# Patient Record
Sex: Male | Born: 1960 | Race: White | Hispanic: No | Marital: Single | Smoking: Current every day smoker
Health system: Southern US, Community
[De-identification: ages and names within clinical notes are randomized; demographics above are authoritative.]

## PROBLEM LIST (undated history)

## (undated) DIAGNOSIS — K089 Disorder of teeth and supporting structures, unspecified: Secondary | ICD-10-CM

## (undated) DIAGNOSIS — F1020 Alcohol dependence, uncomplicated: Secondary | ICD-10-CM

## (undated) DIAGNOSIS — F1010 Alcohol abuse, uncomplicated: Secondary | ICD-10-CM

## (undated) DIAGNOSIS — B182 Chronic viral hepatitis C: Secondary | ICD-10-CM

## (undated) DIAGNOSIS — K859 Acute pancreatitis without necrosis or infection, unspecified: Secondary | ICD-10-CM

## (undated) DIAGNOSIS — J449 Chronic obstructive pulmonary disease, unspecified: Secondary | ICD-10-CM

## (undated) DIAGNOSIS — K0889 Other specified disorders of teeth and supporting structures: Secondary | ICD-10-CM

## (undated) DIAGNOSIS — J869 Pyothorax without fistula: Secondary | ICD-10-CM

## (undated) DIAGNOSIS — M549 Dorsalgia, unspecified: Secondary | ICD-10-CM

## (undated) DIAGNOSIS — J189 Pneumonia, unspecified organism: Secondary | ICD-10-CM

## (undated) DIAGNOSIS — Z720 Tobacco use: Secondary | ICD-10-CM

## (undated) DIAGNOSIS — F17200 Nicotine dependence, unspecified, uncomplicated: Secondary | ICD-10-CM

---
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
## 2000-11-02 ENCOUNTER — Emergency Department (HOSPITAL_COMMUNITY): Admission: EM | Admit: 2000-11-02 | Discharge: 2000-11-02 | Payer: Self-pay | Admitting: Emergency Medicine

## 2000-11-02 ENCOUNTER — Encounter: Payer: Self-pay | Admitting: Emergency Medicine

## 2000-11-12 ENCOUNTER — Emergency Department (HOSPITAL_COMMUNITY): Admission: EM | Admit: 2000-11-12 | Discharge: 2000-11-12 | Payer: Self-pay | Admitting: *Deleted

## 2010-04-25 ENCOUNTER — Inpatient Hospital Stay (HOSPITAL_COMMUNITY): Admission: AC | Admit: 2010-04-25 | Discharge: 2010-05-20 | Payer: Self-pay | Source: Home / Self Care

## 2010-06-03 ENCOUNTER — Ambulatory Visit (HOSPITAL_COMMUNITY)
Admission: RE | Admit: 2010-06-03 | Discharge: 2010-06-03 | Payer: Self-pay | Source: Home / Self Care | Attending: General Surgery | Admitting: General Surgery

## 2010-06-30 ENCOUNTER — Ambulatory Visit: Admit: 2010-06-30 | Payer: Self-pay | Admitting: General Surgery

## 2010-08-19 LAB — CBC
HCT: 34.5 % — ABNORMAL LOW (ref 39.0–52.0)
HCT: 34.7 % — ABNORMAL LOW (ref 39.0–52.0)
HCT: 35 % — ABNORMAL LOW (ref 39.0–52.0)
HCT: 21.7 % — ABNORMAL LOW (ref 39.0–52.0)
HCT: 29.5 % — ABNORMAL LOW (ref 39.0–52.0)
HCT: 30.3 % — ABNORMAL LOW (ref 39.0–52.0)
HCT: 30.6 % — ABNORMAL LOW (ref 39.0–52.0)
HCT: 35 % — ABNORMAL LOW (ref 39.0–52.0)
Hemoglobin: 10.4 g/dL — ABNORMAL LOW (ref 13.0–17.0)
Hemoglobin: 10.8 g/dL — ABNORMAL LOW (ref 13.0–17.0)
Hemoglobin: 10.9 g/dL — ABNORMAL LOW (ref 13.0–17.0)
Hemoglobin: 11 g/dL — ABNORMAL LOW (ref 13.0–17.0)
Hemoglobin: 11.4 g/dL — ABNORMAL LOW (ref 13.0–17.0)
Hemoglobin: 11.5 g/dL — ABNORMAL LOW (ref 13.0–17.0)
Hemoglobin: 12 g/dL — ABNORMAL LOW (ref 13.0–17.0)
Hemoglobin: 10 g/dL — ABNORMAL LOW (ref 13.0–17.0)
Hemoglobin: 10.1 g/dL — ABNORMAL LOW (ref 13.0–17.0)
Hemoglobin: 10.1 g/dL — ABNORMAL LOW (ref 13.0–17.0)
Hemoglobin: 10.3 g/dL — ABNORMAL LOW (ref 13.0–17.0)
Hemoglobin: 10.8 g/dL — ABNORMAL LOW (ref 13.0–17.0)
Hemoglobin: 11.8 g/dL — ABNORMAL LOW (ref 13.0–17.0)
Hemoglobin: 14.3 g/dL (ref 13.0–17.0)
Hemoglobin: 9 g/dL — ABNORMAL LOW (ref 13.0–17.0)
Hemoglobin: 9.9 g/dL — ABNORMAL LOW (ref 13.0–17.0)
MCH: 30.5 pg (ref 26.0–34.0)
MCH: 30.9 pg (ref 26.0–34.0)
MCH: 31 pg (ref 26.0–34.0)
MCH: 31.1 pg (ref 26.0–34.0)
MCH: 31.3 pg (ref 26.0–34.0)
MCH: 31.3 pg (ref 26.0–34.0)
MCH: 31.4 pg (ref 26.0–34.0)
MCH: 33.5 pg (ref 26.0–34.0)
MCH: 33.8 pg (ref 26.0–34.0)
MCH: 34 pg (ref 26.0–34.0)
MCH: 34 pg (ref 26.0–34.0)
MCHC: 31.9 g/dL (ref 30.0–36.0)
MCHC: 32.3 g/dL (ref 30.0–36.0)
MCHC: 32.4 g/dL (ref 30.0–36.0)
MCHC: 32.5 g/dL (ref 30.0–36.0)
MCHC: 32.8 g/dL (ref 30.0–36.0)
MCHC: 32.9 g/dL (ref 30.0–36.0)
MCHC: 32.9 g/dL (ref 30.0–36.0)
MCHC: 33 g/dL (ref 30.0–36.0)
MCHC: 33.7 g/dL (ref 30.0–36.0)
MCHC: 34.2 g/dL (ref 30.0–36.0)
MCHC: 34.4 g/dL (ref 30.0–36.0)
MCHC: 35 g/dL (ref 30.0–36.0)
MCV: 97.9 fL (ref 78.0–100.0)
MCV: 98 fL (ref 78.0–100.0)
MCV: 98.3 fL (ref 78.0–100.0)
MCV: 98.3 fL (ref 78.0–100.0)
MCV: 98.8 fL (ref 78.0–100.0)
MCV: 95.8 fL (ref 78.0–100.0)
MCV: 99.3 fL (ref 78.0–100.0)
MCV: 99.4 fL (ref 78.0–100.0)
Platelets: 352 10*3/uL (ref 150–400)
Platelets: 408 10*3/uL — ABNORMAL HIGH (ref 150–400)
Platelets: 426 10*3/uL — ABNORMAL HIGH (ref 150–400)
Platelets: 457 10*3/uL — ABNORMAL HIGH (ref 150–400)
Platelets: 478 10*3/uL — ABNORMAL HIGH (ref 150–400)
Platelets: 522 10*3/uL — ABNORMAL HIGH (ref 150–400)
Platelets: 570 10*3/uL — ABNORMAL HIGH (ref 150–400)
Platelets: 120 10*3/uL — ABNORMAL LOW (ref 150–400)
Platelets: 140 10*3/uL — ABNORMAL LOW (ref 150–400)
Platelets: 154 10*3/uL (ref 150–400)
Platelets: 208 10*3/uL (ref 150–400)
Platelets: 239 10*3/uL (ref 150–400)
Platelets: 364 10*3/uL (ref 150–400)
Platelets: 487 10*3/uL — ABNORMAL HIGH (ref 150–400)
RBC: 3.27 MIL/uL — ABNORMAL LOW (ref 4.22–5.81)
RBC: 3.43 MIL/uL — ABNORMAL LOW (ref 4.22–5.81)
RBC: 3.45 MIL/uL — ABNORMAL LOW (ref 4.22–5.81)
RBC: 3.53 MIL/uL — ABNORMAL LOW (ref 4.22–5.81)
RBC: 3.56 MIL/uL — ABNORMAL LOW (ref 4.22–5.81)
RBC: 3.7 MIL/uL — ABNORMAL LOW (ref 4.22–5.81)
RBC: 3.73 MIL/uL — ABNORMAL LOW (ref 4.22–5.81)
RBC: 3.76 MIL/uL — ABNORMAL LOW (ref 4.22–5.81)
RBC: 3.78 MIL/uL — ABNORMAL LOW (ref 4.22–5.81)
RBC: 2.24 MIL/uL — ABNORMAL LOW (ref 4.22–5.81)
RBC: 2.6 MIL/uL — ABNORMAL LOW (ref 4.22–5.81)
RBC: 2.66 MIL/uL — ABNORMAL LOW (ref 4.22–5.81)
RBC: 2.68 MIL/uL — ABNORMAL LOW (ref 4.22–5.81)
RBC: 3.05 MIL/uL — ABNORMAL LOW (ref 4.22–5.81)
RBC: 3.08 MIL/uL — ABNORMAL LOW (ref 4.22–5.81)
RBC: 3.08 MIL/uL — ABNORMAL LOW (ref 4.22–5.81)
RBC: 3.11 MIL/uL — ABNORMAL LOW (ref 4.22–5.81)
RBC: 3.13 MIL/uL — ABNORMAL LOW (ref 4.22–5.81)
RBC: 3.18 MIL/uL — ABNORMAL LOW (ref 4.22–5.81)
RBC: 3.18 MIL/uL — ABNORMAL LOW (ref 4.22–5.81)
RBC: 3.52 MIL/uL — ABNORMAL LOW (ref 4.22–5.81)
RDW: 13.1 % (ref 11.5–15.5)
RDW: 13.3 % (ref 11.5–15.5)
RDW: 13.4 % (ref 11.5–15.5)
RDW: 14.1 % (ref 11.5–15.5)
RDW: 12 % (ref 11.5–15.5)
RDW: 12.4 % (ref 11.5–15.5)
WBC: 7.1 10*3/uL (ref 4.0–10.5)
WBC: 7.6 10*3/uL (ref 4.0–10.5)
WBC: 7.9 10*3/uL (ref 4.0–10.5)
WBC: 8.8 10*3/uL (ref 4.0–10.5)
WBC: 4.4 10*3/uL (ref 4.0–10.5)
WBC: 4.8 10*3/uL (ref 4.0–10.5)
WBC: 5.1 10*3/uL (ref 4.0–10.5)
WBC: 5.6 10*3/uL (ref 4.0–10.5)
WBC: 6.6 10*3/uL (ref 4.0–10.5)
WBC: 6.7 10*3/uL (ref 4.0–10.5)
WBC: 6.8 10*3/uL (ref 4.0–10.5)

## 2010-08-19 LAB — GLUCOSE, CAPILLARY
Glucose-Capillary: 105 mg/dL — ABNORMAL HIGH (ref 70–99)
Glucose-Capillary: 111 mg/dL — ABNORMAL HIGH (ref 70–99)
Glucose-Capillary: 112 mg/dL — ABNORMAL HIGH (ref 70–99)
Glucose-Capillary: 113 mg/dL — ABNORMAL HIGH (ref 70–99)
Glucose-Capillary: 118 mg/dL — ABNORMAL HIGH (ref 70–99)
Glucose-Capillary: 119 mg/dL — ABNORMAL HIGH (ref 70–99)
Glucose-Capillary: 120 mg/dL — ABNORMAL HIGH (ref 70–99)
Glucose-Capillary: 124 mg/dL — ABNORMAL HIGH (ref 70–99)
Glucose-Capillary: 127 mg/dL — ABNORMAL HIGH (ref 70–99)
Glucose-Capillary: 79 mg/dL (ref 70–99)
Glucose-Capillary: 90 mg/dL (ref 70–99)
Glucose-Capillary: 98 mg/dL (ref 70–99)

## 2010-08-19 LAB — COMPREHENSIVE METABOLIC PANEL
ALT: 26 U/L (ref 0–53)
ALT: 82 U/L — ABNORMAL HIGH (ref 0–53)
AST: 113 U/L — ABNORMAL HIGH (ref 0–37)
AST: 190 U/L — ABNORMAL HIGH (ref 0–37)
AST: 27 U/L (ref 0–37)
Albumin: 2.2 g/dL — ABNORMAL LOW (ref 3.5–5.2)
Albumin: 3 g/dL — ABNORMAL LOW (ref 3.5–5.2)
Albumin: 3.4 g/dL — ABNORMAL LOW (ref 3.5–5.2)
Alkaline Phosphatase: 44 U/L (ref 39–117)
Alkaline Phosphatase: 45 U/L (ref 39–117)
BUN: 6 mg/dL (ref 6–23)
CO2: 21 mEq/L (ref 19–32)
CO2: 27 mEq/L (ref 19–32)
CO2: 30 mEq/L (ref 19–32)
Calcium: 8.6 mg/dL (ref 8.4–10.5)
Calcium: 8.8 mg/dL (ref 8.4–10.5)
Chloride: 98 mEq/L (ref 96–112)
Chloride: 99 mEq/L (ref 96–112)
Creatinine, Ser: 0.74 mg/dL (ref 0.4–1.5)
GFR calc Af Amer: >60 mL/min (ref >60)
GFR calc Af Amer: >60 mL/min (ref >60)
GFR calc Af Amer: >60 mL/min (ref >60)
GFR calc non Af Amer: >60 mL/min (ref >60)
GFR calc non Af Amer: >60 mL/min (ref >60)
GFR calc non Af Amer: >60 mL/min (ref >60)
Glucose, Bld: 86 mg/dL (ref 70–99)
Potassium: 3.6 mEq/L (ref 3.5–5.1)
Potassium: 3.6 mEq/L (ref 3.5–5.1)
Sodium: 139 mEq/L (ref 135–145)
Sodium: 140 mEq/L (ref 135–145)
Total Bilirubin: 2 mg/dL — ABNORMAL HIGH (ref 0.3–1.2)
Total Bilirubin: 2.5 mg/dL — ABNORMAL HIGH (ref 0.3–1.2)
Total Protein: 6.7 g/dL (ref 6.0–8.3)

## 2010-08-19 LAB — BASIC METABOLIC PANEL
BUN: 3 mg/dL — ABNORMAL LOW (ref 6–23)
BUN: 5 mg/dL — ABNORMAL LOW (ref 6–23)
BUN: 6 mg/dL (ref 6–23)
CO2: 24 meq/L (ref 19–32)
Calcium: 8.1 mg/dL — ABNORMAL LOW (ref 8.4–10.5)
Calcium: 8.2 mg/dL — ABNORMAL LOW (ref 8.4–10.5)
Calcium: 8.7 mg/dL (ref 8.4–10.5)
Calcium: 8.7 mg/dL (ref 8.4–10.5)
Chloride: 104 mEq/L (ref 96–112)
Chloride: 105 meq/L (ref 96–112)
Creatinine, Ser: 0.63 mg/dL (ref 0.4–1.5)
GFR calc Af Amer: >60 mL/min (ref >60)
GFR calc Af Amer: >60 mL/min (ref >60)
GFR calc Af Amer: >60 mL/min (ref >60)
GFR calc Af Amer: >60 mL/min (ref >60)
GFR calc Af Amer: >60 mL/min (ref >60)
GFR calc Af Amer: >60 mL/min (ref >60)
GFR calc non Af Amer: >60 mL/min (ref >60)
GFR calc non Af Amer: >60 mL/min (ref >60)
GFR calc non Af Amer: >60 mL/min (ref >60)
GFR calc non Af Amer: >60 mL/min (ref >60)
GFR calc non Af Amer: >60 mL/min (ref >60)
Glucose, Bld: 107 mg/dL — ABNORMAL HIGH (ref 70–99)
Glucose, Bld: 144 mg/dL — ABNORMAL HIGH (ref 70–99)
Potassium: 3.7 mEq/L (ref 3.5–5.1)
Potassium: 3.8 mEq/L (ref 3.5–5.1)
Potassium: 4 meq/L (ref 3.5–5.1)
Potassium: 5 mEq/L (ref 3.5–5.1)
Sodium: 136 meq/L (ref 135–145)
Sodium: 137 mEq/L (ref 135–145)
Sodium: 139 mEq/L (ref 135–145)
Sodium: 140 mEq/L (ref 135–145)
Sodium: 142 mEq/L (ref 135–145)

## 2010-08-19 LAB — URINALYSIS, ROUTINE W REFLEX MICROSCOPIC
Bilirubin Urine: NEGATIVE
Glucose, UA: NEGATIVE mg/dL
Ketones, ur: >80 mg/dL — AB
pH: 5.5 (ref 5.0–8.0)

## 2010-08-19 LAB — POCT I-STAT, CHEM 8
BUN: <3 mg/dL — ABNORMAL LOW (ref 6–23)
Chloride: 104 meq/L (ref 96–112)
Creatinine, Ser: 1 mg/dL (ref 0.4–1.5)
Glucose, Bld: 95 mg/dL (ref 70–99)
Potassium: 3.8 meq/L (ref 3.5–5.1)
Sodium: 138 meq/L (ref 135–145)

## 2010-08-19 LAB — URINALYSIS, MICROSCOPIC ONLY
Bilirubin Urine: NEGATIVE
Ketones, ur: NEGATIVE mg/dL
Nitrite: NEGATIVE
Urobilinogen, UA: 1 mg/dL (ref 0.0–1.0)
pH: 8 (ref 5.0–8.0)

## 2010-08-19 LAB — DIFFERENTIAL
Basophils Relative: 0 % (ref 0–1)
Eosinophils Absolute: 0 10*3/uL (ref 0.0–0.7)
Lymphs Abs: 1.2 10*3/uL (ref 0.7–4.0)
Monocytes Relative: 14 % — ABNORMAL HIGH (ref 3–12)
Neutro Abs: 4.5 10*3/uL (ref 1.7–7.7)
Neutrophils Relative %: 68 % (ref 43–77)

## 2010-08-19 LAB — PROTIME-INR
INR: 1.54 — ABNORMAL HIGH (ref 0.00–1.49)
INR: 1.55 — ABNORMAL HIGH (ref 0.00–1.49)
INR: 1.88 — ABNORMAL HIGH (ref 0.00–1.49)
INR: 1.54 — ABNORMAL HIGH (ref 0.00–1.49)
INR: 1.74 — ABNORMAL HIGH (ref 0.00–1.49)
Prothrombin Time: 17.3 seconds — ABNORMAL HIGH (ref 11.6–15.2)
Prothrombin Time: 18.7 seconds — ABNORMAL HIGH (ref 11.6–15.2)
Prothrombin Time: 19.1 seconds — ABNORMAL HIGH (ref 11.6–15.2)
Prothrombin Time: 19.3 seconds — ABNORMAL HIGH (ref 11.6–15.2)
Prothrombin Time: 21.2 seconds — ABNORMAL HIGH (ref 11.6–15.2)
Prothrombin Time: 21.4 seconds — ABNORMAL HIGH (ref 11.6–15.2)
Prothrombin Time: 22.3 seconds — ABNORMAL HIGH (ref 11.6–15.2)
Prothrombin Time: 28.1 seconds — ABNORMAL HIGH (ref 11.6–15.2)
Prothrombin Time: 18.7 seconds — ABNORMAL HIGH (ref 11.6–15.2)
Prothrombin Time: 20.5 seconds — ABNORMAL HIGH (ref 11.6–15.2)
Prothrombin Time: 22 seconds — ABNORMAL HIGH (ref 11.6–15.2)

## 2010-08-19 LAB — TYPE AND SCREEN: Unit division: 0

## 2010-08-19 LAB — POCT I-STAT 3, ART BLOOD GAS (G3+)
Patient temperature: 99.9
TCO2: 29 mmol/L (ref 0–100)
pCO2 arterial: 36.8 mmHg (ref 35.0–45.0)
pH, Arterial: 7.477 — ABNORMAL HIGH (ref 7.350–7.450)

## 2010-08-19 LAB — CROSSMATCH
ABO/RH(D): AB POS
Unit division: 0

## 2010-08-19 LAB — CULTURE, RESPIRATORY W GRAM STAIN

## 2010-08-19 LAB — HEPARIN LEVEL (UNFRACTIONATED)
Heparin Unfractionated: 0.1 IU/mL — ABNORMAL LOW (ref 0.30–0.70)
Heparin Unfractionated: 0.12 IU/mL — ABNORMAL LOW (ref 0.30–0.70)
Heparin Unfractionated: 0.19 IU/mL — ABNORMAL LOW (ref 0.30–0.70)
Heparin Unfractionated: 0.28 IU/mL — ABNORMAL LOW (ref 0.30–0.70)
Heparin Unfractionated: 0.33 IU/mL (ref 0.30–0.70)
Heparin Unfractionated: 0.35 IU/mL (ref 0.30–0.70)
Heparin Unfractionated: 0.38 IU/mL (ref 0.30–0.70)
Heparin Unfractionated: 0.45 IU/mL (ref 0.30–0.70)
Heparin Unfractionated: 0.51 IU/mL (ref 0.30–0.70)
Heparin Unfractionated: <0.1 IU/mL — ABNORMAL LOW (ref 0.30–0.70)
Heparin Unfractionated: <0.1 IU/mL — ABNORMAL LOW (ref 0.30–0.70)
Heparin Unfractionated: <0.1 IU/mL — ABNORMAL LOW (ref 0.30–0.70)
Heparin Unfractionated: <0.1 [IU]/mL — ABNORMAL LOW (ref 0.30–0.70)
Heparin Unfractionated: >2 IU/mL — ABNORMAL HIGH (ref 0.30–0.70)

## 2010-08-19 LAB — AMMONIA: Ammonia: 19 umol/L (ref 11–35)

## 2010-08-19 LAB — URINE MICROSCOPIC-ADD ON

## 2010-08-19 LAB — MRSA PCR SCREENING: MRSA by PCR: NEGATIVE

## 2010-08-19 LAB — ABO/RH: ABO/RH(D): AB POS

## 2011-03-17 ENCOUNTER — Emergency Department (HOSPITAL_COMMUNITY)
Admission: EM | Admit: 2011-03-17 | Discharge: 2011-03-17 | Discharge status: Against Medical Advice | Payer: Medicaid Other | Attending: Emergency Medicine | Admitting: Emergency Medicine

## 2011-03-17 DIAGNOSIS — Z79899 Other long term (current) drug therapy: Secondary | ICD-10-CM | POA: Insufficient documentation

## 2011-03-17 DIAGNOSIS — R10819 Abdominal tenderness, unspecified site: Secondary | ICD-10-CM | POA: Insufficient documentation

## 2011-03-17 DIAGNOSIS — R109 Unspecified abdominal pain: Secondary | ICD-10-CM | POA: Insufficient documentation

## 2011-03-17 DIAGNOSIS — K644 Residual hemorrhoidal skin tags: Secondary | ICD-10-CM | POA: Insufficient documentation

## 2011-03-17 DIAGNOSIS — R197 Diarrhea, unspecified: Secondary | ICD-10-CM | POA: Insufficient documentation

## 2011-03-17 DIAGNOSIS — K859 Acute pancreatitis without necrosis or infection, unspecified: Secondary | ICD-10-CM | POA: Insufficient documentation

## 2011-03-17 DIAGNOSIS — F1010 Alcohol abuse, uncomplicated: Secondary | ICD-10-CM | POA: Insufficient documentation

## 2011-03-17 LAB — DIFFERENTIAL
Basophils Absolute: 0.1 10*3/uL (ref 0.0–0.1)
Lymphocytes Relative: 16 % (ref 12–46)
Lymphs Abs: 1 10*3/uL (ref 0.7–4.0)
Neutro Abs: 3.5 10*3/uL (ref 1.7–7.7)
Neutrophils Relative %: 54 % (ref 43–77)

## 2011-03-17 LAB — COMPREHENSIVE METABOLIC PANEL
ALT: 50 U/L (ref 0–53)
AST: 43 U/L — ABNORMAL HIGH (ref 0–37)
Albumin: 2.7 g/dL — ABNORMAL LOW (ref 3.5–5.2)
Alkaline Phosphatase: 117 U/L (ref 39–117)
BUN: 4 mg/dL — ABNORMAL LOW (ref 6–23)
Chloride: 94 mEq/L — ABNORMAL LOW (ref 96–112)
Potassium: 3.4 mEq/L — ABNORMAL LOW (ref 3.5–5.1)
Sodium: 128 mEq/L — ABNORMAL LOW (ref 135–145)
Total Bilirubin: 0.3 mg/dL (ref 0.3–1.2)

## 2011-03-17 LAB — CBC
HCT: 43.4 % (ref 39.0–52.0)
Hemoglobin: 15.8 g/dL (ref 13.0–17.0)
MCV: 93.3 fL (ref 78.0–100.0)
RBC: 4.65 MIL/uL (ref 4.22–5.81)
RDW: 13 % (ref 11.5–15.5)
WBC: 6.5 10*3/uL (ref 4.0–10.5)

## 2011-03-17 LAB — OCCULT BLOOD, POC DEVICE: Fecal Occult Bld: POSITIVE

## 2011-03-18 ENCOUNTER — Inpatient Hospital Stay (HOSPITAL_COMMUNITY)
Admission: EM | Admit: 2011-03-18 | Discharge: 2011-03-22 | DRG: 439 | Disposition: A | Discharge status: Home / Self Care | Payer: Medicaid Other | Attending: Internal Medicine | Admitting: Internal Medicine

## 2011-03-18 DIAGNOSIS — F10229 Alcohol dependence with intoxication, unspecified: Secondary | ICD-10-CM | POA: Diagnosis present

## 2011-03-18 DIAGNOSIS — I10 Essential (primary) hypertension: Secondary | ICD-10-CM | POA: Diagnosis not present

## 2011-03-18 DIAGNOSIS — K859 Acute pancreatitis without necrosis or infection, unspecified: Principal | ICD-10-CM | POA: Diagnosis present

## 2011-03-18 DIAGNOSIS — F17200 Nicotine dependence, unspecified, uncomplicated: Secondary | ICD-10-CM | POA: Diagnosis present

## 2011-03-18 DIAGNOSIS — Z23 Encounter for immunization: Secondary | ICD-10-CM

## 2011-03-18 DIAGNOSIS — E871 Hypo-osmolality and hyponatremia: Secondary | ICD-10-CM | POA: Diagnosis present

## 2011-03-18 DIAGNOSIS — E876 Hypokalemia: Secondary | ICD-10-CM | POA: Diagnosis not present

## 2011-03-18 LAB — CBC
HCT: 42.3 % (ref 39.0–52.0)
MCH: 34.1 pg — ABNORMAL HIGH (ref 26.0–34.0)
MCV: 93.6 fL (ref 78.0–100.0)
Platelets: 279 10*3/uL (ref 150–400)
RDW: 13 % (ref 11.5–15.5)

## 2011-03-18 LAB — DIFFERENTIAL
Eosinophils Absolute: 0.2 10*3/uL (ref 0.0–0.7)
Eosinophils Relative: 2 % (ref 0–5)
Lymphs Abs: 1.2 10*3/uL (ref 0.7–4.0)
Monocytes Relative: 16 % — ABNORMAL HIGH (ref 3–12)

## 2011-03-18 LAB — PROTIME-INR: Prothrombin Time: 12.9 seconds (ref 11.6–15.2)

## 2011-03-18 LAB — COMPREHENSIVE METABOLIC PANEL
AST: 28 U/L (ref 0–37)
Albumin: 2.7 g/dL — ABNORMAL LOW (ref 3.5–5.2)
BUN: 4 mg/dL — ABNORMAL LOW (ref 6–23)
CO2: 22 mEq/L (ref 19–32)
Calcium: 9.1 mg/dL (ref 8.4–10.5)
Creatinine, Ser: 0.59 mg/dL (ref 0.50–1.35)
GFR calc non Af Amer: >90 mL/min (ref >90)

## 2011-03-18 LAB — MAGNESIUM: Magnesium: 1.9 mg/dL (ref 1.5–2.5)

## 2011-03-18 LAB — RAPID URINE DRUG SCREEN, HOSP PERFORMED
Barbiturates: NOT DETECTED
Benzodiazepines: NOT DETECTED
Cocaine: NOT DETECTED
Tetrahydrocannabinol: NOT DETECTED

## 2011-03-18 LAB — GLUCOSE, CAPILLARY: Glucose-Capillary: 98 mg/dL (ref 70–99)

## 2011-03-18 LAB — LIPASE, BLOOD: Lipase: 100 U/L — ABNORMAL HIGH (ref 11–59)

## 2011-03-18 LAB — ETHANOL: Alcohol, Ethyl (B): 147 mg/dL — ABNORMAL HIGH (ref 0–11)

## 2011-03-18 LAB — HEMOGLOBIN A1C: Hgb A1c MFr Bld: 5.1 % (ref <5.7)

## 2011-03-19 LAB — GLUCOSE, CAPILLARY: Glucose-Capillary: 103 mg/dL — ABNORMAL HIGH (ref 70–99)

## 2011-03-19 LAB — CLOSTRIDIUM DIFFICILE BY PCR: Toxigenic C. Difficile by PCR: NEGATIVE

## 2011-03-19 NOTE — H&P (Signed)
[name], [name] NO.:  [id number]  MEDICAL RECORD NO.:  [id number]  LOCATION:  [facility]                         FACILITY:  [facility]  PHYSICIAN:  Manson Passey, MD        DATE OF BIRTH:  [birth year]  DATE OF ADMISSION:  03/18/2011 DATE OF DISCHARGE:                             HISTORY & PHYSICAL   PRIMARY CARE PHYSICIAN:  Unassigned.  CHIEF COMPLAINT:  Diarrhea and abdominal pain for 7 days.  HPI:  The patient is a 50 year old male with a history of chronic alcohol abuse, history of pancreatitis, active smoker, who presents to emergency room with complaints of having diarrhea for 7 days prior to admission to emergency room.  The patient reports having at least 10 bowel movements a day with no blood in it, no pause, no mucus.  The patient has tried Imodium over the counter.  However, there was no symptomatic relief.  The patient also complains of associated periumbilical pain that radiates to the left and right upper quadrant,constant, 10/10 in intensity (at present has subsided to 7/10 in intensity.)  The patient also reports having nausea and has vomited only 1 time 7 days ago with no blood in the vomiting.  The patient denied hemoptysis or hematemesis.  The patient denies chest pain, shortness of breath, cough, fever, or chills.  The patient denies blood in the stool or urine.  There are no reports of lightheadedness, dizziness, loss of consciousness.  The patient reports drinking 3 beers a day for more than 20 years, but has not have similar symptoms in back.  REVIEW OF SYSTEMS:  As per HPI.  PAST MEDICAL HISTORY: 1. Chronic alcohol abuse. 2. Pancreatitis. 3. Active smoker.  PAST SURGICAL HISTORY: 1. Surgery on the neck.  FAMILY HISTORY: 1. Father, hypertension.  SOCIAL HISTORY:  The patient reports drinking 3-4 beers a day for more than 20 years, smokes 2 packs per day for 25 years, no illegal drug abuse.  ALLERGIES:  No known drug  allergies.  MEDICATIONS:  At home; 1. Vicodin 1 to 1-1/2 tab daily as needed. 2. Goody's powders (aspirin/APAP/caffeine) 1 packet daily as needed.  PHYSICAL EXAM:  VITAL SIGNS:  Blood pressure 130/77, pulse 110, respirations 22, temperature 98.1 (T-max 98.7 Fahrenheit), and oxygen saturation 96% on room air. GENERAL APPEARANCE:  No acute distress, the patient appears comfortable. HEENT:  Normocephalic/atraumatic, pupils equally round, reactive to light and accommodation, extraocular muscles intact, no scleral icterus, no tonsillar erythema, or exudate. NECK:  Supple, full range of motion, no lymphadenopathy. LUNGS:  Clear to auscultation bilaterally, no wheezing, no rales, no rhonchi. CARDIOVASCULAR:  Positive S1 and S2, regular rate and rhythm.  No murmurs. ABDOMEN:  Positive bowel sounds, soft, nontender/nondistended. EXTREMITIES:  Pulses palpable bilaterally, no lower extremity edema. SKIN:  Warm, dry. NEUROLOGIC:  Alert, awake, oriented x3, no focal neurologic deficits.  LABORATORY:  March 18, 2011, lipase 100, alcohol level 147.  Sodium 131 potassium 3.5 chloride 96, bicarb 22 glucose 98, BUN 4 creatinine 0.59, bilirubin total 0.5, ALT 105 AST 28, ALT 38, albumin 2.7, calcium 9.1.  White blood cells 8.9, hemoglobin 15.4, hematocrit 42.3, platelet count 279.  ASSESSMENT AND PLAN:  1. Acute alcohol intoxication.  The patient complained of diarrhea is     likely related to the acute alcohol intoxication.  For now, we will     continue IV fluids at 150 mL an hour normal saline, and will     monitor for withdrawal.  At present, the patient displays no signs     of acute alcohol withdrawal.  We will initiate multivitamin,     thiamine, and folic acid.  We will monitor electrolytes. 2. Acute pancreatitis, likely slightly low 100.  Keep the patient     n.p.o. for now with aggressive fluid hydration with normal saline     anywhere from 150-200 mL an hour.  We will check LFTs and  trend     lipase.  Start Protonix 40 mg IV daily.  We will obtain abdominal x-     ray. 3. History of smoking/active smoker, the patient will get nicotine     contained patch. 4. Diet n.p.o. 5. Deep venous thrombosis prophylaxis, Lovenox subcu. 6. Education, the patient is aware and understands the plan of care     and treatment. 7. Time spent admitting the patient more than 25 minutes.          ______________________________ Manson Passey, MD     AD/MEDQ  D:  03/18/2011  T:  03/18/2011  Job:  [id number]  Electronically Signed by Manson Passey MD on 03/19/2011 09:42:53 AM

## 2011-03-20 LAB — BASIC METABOLIC PANEL
BUN: 5 mg/dL — ABNORMAL LOW (ref 6–23)
Chloride: 100 mEq/L (ref 96–112)
GFR calc Af Amer: >90 mL/min (ref >90)
GFR calc non Af Amer: >90 mL/min (ref >90)
Potassium: 3.1 mEq/L — ABNORMAL LOW (ref 3.5–5.1)
Sodium: 133 mEq/L — ABNORMAL LOW (ref 135–145)

## 2011-03-20 LAB — LIPASE, BLOOD: Lipase: 261 U/L — ABNORMAL HIGH (ref 11–59)

## 2011-03-20 LAB — GLUCOSE, CAPILLARY
Glucose-Capillary: 109 mg/dL — ABNORMAL HIGH (ref 70–99)
Glucose-Capillary: 109 mg/dL — ABNORMAL HIGH (ref 70–99)
Glucose-Capillary: 100 mg/dL — ABNORMAL HIGH (ref 70–99)

## 2011-03-21 LAB — BASIC METABOLIC PANEL
BUN: 3 mg/dL — ABNORMAL LOW (ref 6–23)
CO2: 23 mEq/L (ref 19–32)
Calcium: 8.4 mg/dL (ref 8.4–10.5)
Chloride: 100 mEq/L (ref 96–112)
Creatinine, Ser: 0.5 mg/dL (ref 0.50–1.35)
Glucose, Bld: 96 mg/dL (ref 70–99)

## 2011-03-21 LAB — CBC
Hemoglobin: 12.7 g/dL — ABNORMAL LOW (ref 13.0–17.0)
MCH: 33.6 pg (ref 26.0–34.0)
MCHC: 35.2 g/dL (ref 30.0–36.0)
Platelets: 310 10*3/uL (ref 150–400)
RDW: 12.9 % (ref 11.5–15.5)

## 2011-03-21 LAB — GLUCOSE, CAPILLARY
Glucose-Capillary: 125 mg/dL — ABNORMAL HIGH (ref 70–99)
Glucose-Capillary: 99 mg/dL (ref 70–99)

## 2011-03-22 LAB — BASIC METABOLIC PANEL
BUN: 3 mg/dL — ABNORMAL LOW (ref 6–23)
CO2: 24 mEq/L (ref 19–32)
Chloride: 101 mEq/L (ref 96–112)
GFR calc non Af Amer: >90 mL/min (ref >90)
Glucose, Bld: 99 mg/dL (ref 70–99)
Potassium: 3.3 mEq/L — ABNORMAL LOW (ref 3.5–5.1)
Sodium: 135 mEq/L (ref 135–145)

## 2011-03-22 LAB — GLUCOSE, CAPILLARY: Glucose-Capillary: 103 mg/dL — ABNORMAL HIGH (ref 70–99)

## 2011-03-29 NOTE — Discharge Summary (Signed)
[name], [name] NO.:  [id number]  MEDICAL RECORD NO.:  [id number]  LOCATION:  5509                         FACILITY:  [facility]  PHYSICIAN:  Manson Passey, MD        DATE OF BIRTH:  [birth year]  DATE OF ADMISSION:  03/18/2011 DATE OF DISCHARGE:  03/22/2011                              DISCHARGE SUMMARY   PRIMARY CARE PHYSICIAN:  Unassigned.  DISCHARGE DIAGNOSES: 1. Acute pancreatitis 2. Diarrhea. 3. History of alcohol abuse. 4. Active smoker.  DISCHARGE MEDICATIONS: 1. Pancreatic lipase 3 times a day with meals. 2. Folic acid 1 mg daily. 3. Labetalol 100 mg twice daily. 4. Multivitamin 1 tablets daily. 5. Zofran 4 mg every 6 hours as needed. 6. Protonix 40 mg by mouth daily. 7. Thiamine 100 mg daily. 8. Vicodin 5/500, 1 to 1-1/2 tab daily as needed.  CONSULTATIONS:  None.  DIAGNOSTIC STUDIES:  None.  DISCHARGE LABS:  Magnesium 1.7, sodium 135, potassium 3.3, chloride 101, bicarb 24, glucose 99, BUN 3 creatinine 0.6, calcium 8.7.  White blood cells 8.7, hemoglobin 12.7, hematocrit 36.1, platelet count 310,000.  C. diff negative.  Hemoglobin A1c 5.1.  HISTORY OF PRESENT ILLNESS:  The patient is a pleasant 50 year old male with history of chronic alcohol abuse, pancreatitis, active smoker who presents to emergency room with complaints of diarrhea as many as 15 episodes a day, nonbloody, started 7 days prior to admission.  The patient reports no history of black stool or blood in the stool.  The patient also complained of periumbilical pain, constant, 10/10 in intensity on admission.  He reported having nausea and only 1 episode of vomiting.  It happened 7 days ago prior to admission.  No complaints of chest pain, shortness of breath, cough, fever, or chills.  No complaints of lightheadedness or dizziness or loss of consciousness.  PHYSICAL EXAMINATION:  VITAL SIGNS:  Blood pressure 152/88, pulse 81, respirations 18, temperature 98.9 Fahrenheit,  oxygen saturation 94% on room air. GENERAL:  No acute distress, the patient appears comfortable. NECK:  Supple, no lymphadenopathy. SKIN:  Warm, dry. LUNGS:  Clear to auscultation bilaterally, no wheezing, no rales, no rhonchi. CARDIOVASCULAR:  Positive S1, S2, regular rate and rhythm. ABDOMEN:  Positive bowel sounds, soft, nontender/nondistended. EXTREMITIES:  Pulses palpable bilaterally, no lower extremity edema. NEUROLOGICAL:  Alert, awake, oriented x3, no focal neurologic deficits. HEENT:  Normocephalic/atraumatic, pupils equally round, reactive to light and accommodation, extraocular muscles intact, no tonsillar erythema or exudate.  HOSPITAL COURSE BY PROBLEM: 1. Acute pancreatitis.  The patient at present has no complaints of     abdominal pain.  The patient will be sent home on Vicodin every 4     hours as needed for pain.  The patient will be also sent home on     regimen of pancreatic lipase enzymes 3 times a day with meals as he     reports significant symptomatic improvement if he started this     medication. 2. History of alcohol abuse.  The patient had an alcohol level of     about 100 on admission.  At present, the patient has no signs or     symptoms  of withdrawal.  The patient is alert, awake, oriented to     time, place, and person.  Counseling was provided on alcohol abuse     and the patient reported verbal understanding and possibility to     follow with alcohol anonymous group to help him quit drinking. 3. History of smoking.  Counseling was provided for smoking cessation.     At this point, the patient verbalizes understanding and does not     wish to quit. 4. Diet regular. 5. Education.  The patient is aware of plan of care and treatment.     The patient is medically stable and clinically appears well to be     discharged home.  TIME SPENT ON DISCHARGING THE PATIENT:  More than 35 minutes.          ______________________________ Manson Passey,  MD     AD/MEDQ  D:  03/22/2011  T:  03/23/2011  Job:  [id number]  Electronically Signed by Manson Passey MD on 03/29/2011 04:11:51 PM

## 2011-04-08 LAB — STOOL CULTURE

## 2011-06-09 HISTORY — PX: KNEE SURGERY: SHX244

## 2011-06-09 HISTORY — PX: NECK SURGERY: SHX720

## 2011-06-09 HISTORY — PX: ARTERY REPAIR: SHX559

## 2012-03-08 ENCOUNTER — Emergency Department (HOSPITAL_COMMUNITY): Payer: Self-pay

## 2012-03-08 ENCOUNTER — Encounter (HOSPITAL_COMMUNITY): Payer: Self-pay | Admitting: Emergency Medicine

## 2012-03-08 ENCOUNTER — Ambulatory Visit (HOSPITAL_COMMUNITY): Admission: AD | Admit: 2012-03-08 | Payer: Self-pay | Source: Ambulatory Visit | Admitting: Psychiatry

## 2012-03-08 ENCOUNTER — Emergency Department (HOSPITAL_COMMUNITY)
Admission: EM | Admit: 2012-03-08 | Discharge: 2012-03-08 | Disposition: A | Discharge status: Home / Self Care | Payer: Self-pay | Attending: Emergency Medicine | Admitting: Emergency Medicine

## 2012-03-08 DIAGNOSIS — R110 Nausea: Secondary | ICD-10-CM | POA: Insufficient documentation

## 2012-03-08 DIAGNOSIS — F1010 Alcohol abuse, uncomplicated: Secondary | ICD-10-CM

## 2012-03-08 DIAGNOSIS — R109 Unspecified abdominal pain: Secondary | ICD-10-CM | POA: Insufficient documentation

## 2012-03-08 DIAGNOSIS — F10239 Alcohol dependence with withdrawal, unspecified: Secondary | ICD-10-CM | POA: Insufficient documentation

## 2012-03-08 DIAGNOSIS — F10939 Alcohol use, unspecified with withdrawal, unspecified: Secondary | ICD-10-CM | POA: Insufficient documentation

## 2012-03-08 HISTORY — DX: Chronic viral hepatitis C: B18.2

## 2012-03-08 HISTORY — DX: Dorsalgia, unspecified: M54.9

## 2012-03-08 LAB — CBC WITH DIFFERENTIAL/PLATELET
Basophils Absolute: 0.1 10*3/uL (ref 0.0–0.1)
Basophils Relative: 1 % (ref 0–1)
Hemoglobin: 17.3 g/dL — ABNORMAL HIGH (ref 13.0–17.0)
Lymphocytes Relative: 23 % (ref 12–46)
MCHC: 35.6 g/dL (ref 30.0–36.0)
Monocytes Relative: 9 % (ref 3–12)
Neutro Abs: 3.2 10*3/uL (ref 1.7–7.7)
Neutrophils Relative %: 64 % (ref 43–77)
RDW: 13 % (ref 11.5–15.5)
WBC: 5 10*3/uL (ref 4.0–10.5)

## 2012-03-08 LAB — COMPREHENSIVE METABOLIC PANEL
ALT: 67 U/L — ABNORMAL HIGH (ref 0–53)
AST: 79 U/L — ABNORMAL HIGH (ref 0–37)
Albumin: 3.6 g/dL (ref 3.5–5.2)
Alkaline Phosphatase: 81 U/L (ref 39–117)
Chloride: 100 mEq/L (ref 96–112)
Potassium: 4.2 mEq/L (ref 3.5–5.1)
Total Bilirubin: 0.5 mg/dL (ref 0.3–1.2)

## 2012-03-08 LAB — RAPID URINE DRUG SCREEN, HOSP PERFORMED
Barbiturates: NOT DETECTED
Cocaine: POSITIVE — AB
Tetrahydrocannabinol: NOT DETECTED

## 2012-03-08 MED ORDER — THIAMINE HCL 100 MG/ML IJ SOLN: 100.0000 mg | Freq: Every day | INTRAMUSCULAR | Status: DC

## 2012-03-08 MED ORDER — LORAZEPAM 2 MG/ML IJ SOLN
2.0000 mg | Freq: Once | INTRAMUSCULAR | Status: AC
  Administered 2012-03-08: 2 mg via INTRAVENOUS
  Filled 2012-03-08: qty 1

## 2012-03-08 MED ORDER — LORAZEPAM 1 MG PO TABS: 0.0000 mg | ORAL_TABLET | Freq: Two times a day (BID) | ORAL | Status: DC

## 2012-03-08 MED ORDER — FOLIC ACID 1 MG PO TABS
1.0000 mg | ORAL_TABLET | Freq: Every day | ORAL | Status: DC
  Administered 2012-03-08: 1 mg via ORAL
  Filled 2012-03-08: qty 1

## 2012-03-08 MED ORDER — IBUPROFEN 200 MG PO TABS: 600.0000 mg | ORAL_TABLET | Freq: Three times a day (TID) | ORAL | Status: DC | PRN

## 2012-03-08 MED ORDER — NICOTINE 21 MG/24HR TD PT24: 21.0000 mg | MEDICATED_PATCH | Freq: Every day | TRANSDERMAL | Status: DC

## 2012-03-08 MED ORDER — VITAMIN B-1 100 MG PO TABS
100.0000 mg | ORAL_TABLET | Freq: Every day | ORAL | Status: DC
  Administered 2012-03-08: 100 mg via ORAL
  Filled 2012-03-08: qty 1

## 2012-03-08 MED ORDER — ONDANSETRON HCL 8 MG PO TABS: 4.0000 mg | ORAL_TABLET | Freq: Three times a day (TID) | ORAL | Status: DC | PRN

## 2012-03-08 MED ORDER — NICOTINE 21 MG/24HR TD PT24
21.0000 mg | MEDICATED_PATCH | Freq: Once | TRANSDERMAL | Status: DC
  Administered 2012-03-08: 21 mg via TRANSDERMAL
  Filled 2012-03-08: qty 1

## 2012-03-08 MED ORDER — ALUM & MAG HYDROXIDE-SIMETH 200-200-20 MG/5ML PO SUSP: 30.0000 mL | ORAL | Status: DC | PRN

## 2012-03-08 MED ORDER — LORAZEPAM 2 MG/ML IJ SOLN: 1.0000 mg | Freq: Four times a day (QID) | INTRAMUSCULAR | Status: DC | PRN

## 2012-03-08 MED ORDER — SODIUM CHLORIDE 0.9 % IV SOLN
INTRAVENOUS | Status: DC
  Administered 2012-03-08: 12:00:00 via INTRAVENOUS

## 2012-03-08 MED ORDER — LORAZEPAM 1 MG PO TABS: 0.0000 mg | ORAL_TABLET | Freq: Four times a day (QID) | ORAL | Status: DC

## 2012-03-08 MED ORDER — LORAZEPAM 1 MG PO TABS
1.0000 mg | ORAL_TABLET | Freq: Four times a day (QID) | ORAL | Status: DC | PRN
  Administered 2012-03-08: 1 mg via ORAL
  Filled 2012-03-08: qty 1

## 2012-03-08 MED ORDER — ONDANSETRON HCL 4 MG/2ML IJ SOLN
4.0000 mg | Freq: Once | INTRAMUSCULAR | Status: AC
  Administered 2012-03-08: 4 mg via INTRAVENOUS
  Filled 2012-03-08: qty 2

## 2012-03-08 MED ORDER — SODIUM CHLORIDE 0.9 % IV BOLUS (SEPSIS)
1000.0000 mL | Freq: Once | INTRAVENOUS | Status: AC
  Administered 2012-03-08: 1000 mL via INTRAVENOUS

## 2012-03-08 MED ORDER — ADULT MULTIVITAMIN W/MINERALS CH
1.0000 | ORAL_TABLET | Freq: Every day | ORAL | Status: DC
  Administered 2012-03-08: 1 via ORAL
  Filled 2012-03-08: qty 1

## 2012-03-08 NOTE — ED Notes (Signed)
Patient states he was told by his lawyer to  Come to hospital to dry out. Patient states he has court date later this month for 3rd DWI. Patient denies chest pain. Patient states he feels anxious.

## 2012-03-08 NOTE — ED Notes (Signed)
Patient request to be discharged AMA.

## 2012-03-08 NOTE — ED Notes (Signed)
To x-ray

## 2012-03-08 NOTE — ED Notes (Signed)
Pt given snacks and something to drink by [facility], EMT

## 2012-03-08 NOTE — BH Assessment (Signed)
Assessment Note   [name] is an 51 y.o. male  that was assessed this day.  Pt presents to [facility] requesting detox from alcohol.  Pt stated in the last year, he has gotten 3 DWI's and has an upcoming court date 04/04/12.  Pt stated he wants detox, "because I got to show the courts I can do right."  Pt reports he drinks 3 40 oz beers daily, last use was last night and pt had 2 40 oz beers.  Pt denies other drug use, but UDS positive for cocaine.  Pt has not had any previous mental health or SA treatment.  Pt denies any current withdrawal sx.  Pt denies seizure hx or blackouts.  Pt does endorse some depressive sx related to drug use.  Pt denies SI/HI or psychosis.  Pt is not prescribed any psychotropic medications.  Completed assessment, assessment notification and faxed to [facility] to run for possible admission.  Updated ED staff.   Axis I: Substance Induced Mood Disorder and ETOH Dependence Axis II: Deferred Axis III:  Past Medical History  Diagnosis Date  . 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 Back pain    Axis IV: other psychosocial or environmental problems, problems related to legal system/crime and problems with primary support group Axis V: 31-40 impairment in reality testing  Past Medical History:  Past Medical History  Diagnosis Date  . 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 Back pain     No past surgical history on file.  Family History: No family history on file.  Social History:  reports that he has been smoking.  He does not have any smokeless tobacco history on file. He reports that he drinks alcohol. His drug history not on file.  Additional Social History:  Alcohol / Drug Use Pain Medications: see MAR Prescriptions: see MAR Over the Counter: see MAR History of alcohol / drug use?: Yes Substance #1 Name of Substance 1: ETOH 1 - Age of First Use: 20 1 - Amount (size/oz): 3 40 oz beers 1 - Frequency: daily 1 - Duration: ongoing 1 - Last Use / Amount: 03/07/12 - 2 40 oz beers  CIWA:  CIWA-Ar BP: 165/103 mmHg Pulse Rate: 139  Nausea and Vomiting: no nausea and no vomiting Tactile Disturbances: none Tremor: no tremor Auditory Disturbances: not present Paroxysmal Sweats: no sweat visible Visual Disturbances: not present Anxiety: no anxiety, at ease Headache, Fullness in Head: none present Agitation: normal activity Orientation and Clouding of Sensorium: oriented and can do serial additions CIWA-Ar Total: 0  COWS:    Allergies:  Allergies  Allergen Reactions  . Penicillins Other (See Comments)    "doesn't remember"    Home Medications:  (Not in a hospital admission)  OB/GYN Status:  No LMP for male patient.  General Assessment Data Location of Assessment: [facility] ED Living Arrangements: Spouse/significant other (Lives with girlfriend) Can pt return to current living arrangement?: Yes Admission Status: Voluntary Is patient capable of signing voluntary admission?: Yes Transfer from: Acute Hospital Referral Source: Self/Family/Friend  Education Status Is patient currently in school?: No  Risk to self Suicidal Ideation: No Suicidal Intent: No Is patient at risk for suicide?: No Suicidal Plan?: No Access to Means: No What has been your use of drugs/alcohol within the last 12 months?: Daily ETOH use, UDS positive cocaine, pt denies use Previous Attempts/Gestures: No How many times?: 0  Other Self Harm Risks: pt denies Triggers for Past Attempts: None known Intentional Self Injurious Behavior: None  Family Suicide History: No Recent stressful life event(s): Other (Comment) (Upcoming court date for 3 DWI's) Persecutory voices/beliefs?: No Depression: Yes Depression Symptoms: Despondent;Feeling worthless/self pity Substance abuse history and/or treatment for substance abuse?: Yes Suicide prevention information given to non-admitted patients: Not applicable  Risk to Others Homicidal Ideation: No Thoughts of Harm to Others: No Current Homicidal Intent:  No Current Homicidal Plan: No Access to Homicidal Means: No Identified Victim: na - pt denies History of harm to others?: No Assessment of Violence: None Noted Violent Behavior Description: na - pt calm, cooperative Does patient have access to weapons?: No Criminal Charges Pending?: Yes Describe Pending Criminal Charges: 3 DWI's Does patient have a court date: Yes Court Date: 04/04/12  Psychosis Hallucinations: None noted Delusions: None noted  Mental Status Report Appear/Hygiene: Disheveled Eye Contact: Fair Motor Activity: Unremarkable Speech: Logical/coherent Level of Consciousness: Alert Mood: Depressed Affect: Appropriate to circumstance Anxiety Level: Minimal Thought Processes: Coherent;Relevant Judgement: Unimpaired Orientation: Person;Place;Time;Situation Obsessive Compulsive Thoughts/Behaviors: None  Cognitive Functioning Concentration: Decreased Memory: Recent Intact;Remote Intact IQ: Average Insight: Fair Impulse Control: Poor Appetite: Fair Weight Loss: 0  Weight Gain: 0  Sleep: No Change Total Hours of Sleep:  (3-4 hrs per night) Vegetative Symptoms: None  ADLScreening [facility] Assessment Services) Patient's cognitive ability adequate to safely complete daily activities?: Yes Patient able to express need for assistance with ADLs?: Yes Independently performs ADLs?: Yes (appropriate for developmental age)  Abuse/Neglect [facility]) Physical Abuse: Denies Verbal Abuse: Denies Sexual Abuse: Denies  Prior Inpatient Therapy Prior Inpatient Therapy: No Prior Therapy Dates: na Prior Therapy Facilty/Provider(s): na Reason for Treatment: na  Prior Outpatient Therapy Prior Outpatient Therapy: No Prior Therapy Dates: na Prior Therapy Facilty/Provider(s): na Reason for Treatment: na  ADL Screening (condition at time of admission) Patient's cognitive ability adequate to safely complete daily activities?: Yes Patient able to express need for assistance with  ADLs?: Yes Independently performs ADLs?: Yes (appropriate for developmental age) Weakness of Legs: None Weakness of Arms/Hands: None  Home Assistive Devices/Equipment Home Assistive Devices/Equipment: None    Abuse/Neglect Assessment (Assessment to be complete while patient is alone) Physical Abuse: Denies Verbal Abuse: Denies Sexual Abuse: Denies Exploitation of patient/patient's resources: Denies Self-Neglect: Denies Values / Beliefs Cultural Requests During Hospitalization: None Spiritual Requests During Hospitalization: None Consults Spiritual Care Consult Needed: No Social Work Consult Needed: No Merchant navy officer (For Healthcare) Advance Directive: Patient does not have advance directive;Patient would not like information    Additional Information 1:1 In Past 12 Months?: No CIRT Risk: No Elopement Risk: No Does patient have medical clearance?: Yes     Disposition:  Disposition Disposition of Patient: Referred to;Inpatient treatment program Type of inpatient treatment program: Adult Patient referred to: Other (Comment) ([facility])  On Site Evaluation by:   Reviewed with Physician:  Park Liter, Rennis Harding 03/08/2012 3:50 PM

## 2012-03-08 NOTE — ED Notes (Signed)
Removed IV from left forearm of pt; pt leaving AMA

## 2012-03-08 NOTE — ED Provider Notes (Signed)
History     CSN: [id number]  Arrival date & time 03/08/12  0920   First MD Initiated Contact with Patient 03/08/12 [phone]      No chief complaint on file.   (Consider location/radiation/quality/duration/timing/severity/associated sxs/prior treatment) The history is provided by the patient.   patient here complaining of alcohol withdrawal symptoms. Normally drinks 160 ounces of beer a day and his last drink was yesterday. Note some tremors without hallucinations. Denies any abdominal pain or vomiting. Some nausea appreciated. No suicidal or homicidal ideations. Has been alcohol rehabilitation before in the past. Does have a history of alcoholic hepatitis. Patient is requesting detox from alcohol  Past Medical History  Diagnosis Date  . 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 Back pain     No past surgical history on file.  No family history on file.  History  Substance Use Topics  . Smoking status: Current Every Day Smoker  . Smokeless tobacco: Not on file  . Alcohol Use: Yes      Review of Systems  All other systems reviewed and are negative.    Allergies  Penicillins  Home Medications   Current Outpatient Rx  Name Route Sig Dispense Refill  . HYDROCODONE-ACETAMINOPHEN 5-500 MG PO TABS Oral Take 1 tablet by mouth 2 (two) times daily.      BP 165/103  Pulse 139  Temp 97 F (36.1 C) (Oral)  Resp 18  SpO2 96%  Physical Exam  Nursing note and vitals reviewed. Constitutional: He is oriented to person, place, and time. He appears well-developed and well-nourished.  Non-toxic appearance. No distress.  HENT:  Head: Normocephalic and atraumatic.  Eyes: Conjunctivae normal, EOM and lids are normal. Pupils are equal, round, and reactive to light.  Neck: Normal range of motion. Neck supple. No tracheal deviation present. No mass present.  Cardiovascular: Regular rhythm and normal heart sounds.  Tachycardia present.  Exam reveals no gallop.   No murmur heard. Pulmonary/Chest:  Effort normal and breath sounds normal. No stridor. No respiratory distress. He has no decreased breath sounds. He has no wheezes. He has no rhonchi. He has no rales.  Abdominal: Soft. Normal appearance and bowel sounds are normal. He exhibits no distension. There is no tenderness. There is no rebound and no CVA tenderness.  Musculoskeletal: Normal range of motion. He exhibits no edema and no tenderness.  Neurological: He is alert and oriented to person, place, and time. He has normal strength. No cranial nerve deficit or sensory deficit. GCS eye subscore is 4. GCS verbal subscore is 5. GCS motor subscore is 6.  Skin: Skin is warm and dry. No abrasion and no rash noted.  Psychiatric: He has a normal mood and affect. His speech is normal and behavior is normal.    ED Course  Procedures (including critical care time)   Labs Reviewed  ETHANOL  URINE RAPID DRUG SCREEN (HOSP PERFORMED)  CBC WITH DIFFERENTIAL  COMPREHENSIVE METABOLIC PANEL  LIPASE, BLOOD   No results found.   No diagnosis found.    MDM  Pt given ativan and iv fluids and monitored--heart rate and tremors improved, medically cleared, awaiting placement        Toy Baker, MD 03/11/12 0745

## 2012-03-08 NOTE — BH Assessment (Addendum)
Assessment Note  Update:  Received call from Tanna Savoy, at [facility] stating pt accepted by Dr. Allena Katz to Dr. Daleen Bo and pt's bed available [facility] does not yet have room number available).  Pt ready to be transported.  Updated EDP Davidosn and ED staff.  However, pt has now decided to leave AMA and refusing inpatient treatment.  EDP Ignacia Palma notified.  Updated assessment disposition, completed assessment notification and faxed to [facility] to log.  Called [facility] as well to inform them that pt leaving [facility] AMA and will not need bed.    Disposition:  Pt accepted [facility], refused inpatient treatment, left AMA  On Site Evaluation by:   Reviewed with Physician:  Bryson Ha, Rennis Harding 03/08/2012 4:58 PM

## 2012-03-08 NOTE — ED Notes (Signed)
2 warm blankets given

## 2012-03-08 NOTE — ED Notes (Signed)
Last drink was last night pt requesting dstox from [facility] states usually drinks in am . Pt flushed smells of et[facility] hx of hep c and back problems is on vicodin

## 2012-03-08 NOTE — ED Notes (Signed)
Pt states he had gotten DWI on scooter because of DWI last year in car has no [place] and lawyer had suggested detox to help w/ court case

## 2012-07-02 ENCOUNTER — Encounter (HOSPITAL_COMMUNITY): Payer: Self-pay | Admitting: *Deleted

## 2012-07-02 ENCOUNTER — Emergency Department (HOSPITAL_COMMUNITY)
Admission: EM | Admit: 2012-07-02 | Discharge: 2012-07-04 | Disposition: A | Discharge status: Home / Self Care | Payer: Self-pay | Attending: Emergency Medicine | Admitting: Emergency Medicine

## 2012-07-02 DIAGNOSIS — K625 Hemorrhage of anus and rectum: Secondary | ICD-10-CM | POA: Insufficient documentation

## 2012-07-02 DIAGNOSIS — F1020 Alcohol dependence, uncomplicated: Secondary | ICD-10-CM | POA: Insufficient documentation

## 2012-07-02 DIAGNOSIS — F17200 Nicotine dependence, unspecified, uncomplicated: Secondary | ICD-10-CM | POA: Insufficient documentation

## 2012-07-02 DIAGNOSIS — K648 Other hemorrhoids: Secondary | ICD-10-CM | POA: Insufficient documentation

## 2012-07-02 DIAGNOSIS — Z8719 Personal history of other diseases of the digestive system: Secondary | ICD-10-CM | POA: Insufficient documentation

## 2012-07-02 DIAGNOSIS — Z8739 Personal history of other diseases of the musculoskeletal system and connective tissue: Secondary | ICD-10-CM | POA: Insufficient documentation

## 2012-07-02 DIAGNOSIS — K644 Residual hemorrhoidal skin tags: Secondary | ICD-10-CM | POA: Insufficient documentation

## 2012-07-02 DIAGNOSIS — Z8619 Personal history of other infectious and parasitic diseases: Secondary | ICD-10-CM | POA: Insufficient documentation

## 2012-07-02 DIAGNOSIS — R1013 Epigastric pain: Secondary | ICD-10-CM | POA: Insufficient documentation

## 2012-07-02 HISTORY — DX: Acute pancreatitis without necrosis or infection, unspecified: K85.90

## 2012-07-02 LAB — RAPID URINE DRUG SCREEN, HOSP PERFORMED
Amphetamines: NOT DETECTED
Barbiturates: NOT DETECTED
Opiates: NOT DETECTED
Tetrahydrocannabinol: NOT DETECTED

## 2012-07-02 LAB — COMPREHENSIVE METABOLIC PANEL
Alkaline Phosphatase: 70 U/L (ref 39–117)
BUN: 10 mg/dL (ref 6–23)
CO2: 22 mEq/L (ref 19–32)
Calcium: 9.2 mg/dL (ref 8.4–10.5)
GFR calc Af Amer: >90 mL/min (ref >90)
GFR calc non Af Amer: >90 mL/min (ref >90)
Glucose, Bld: 101 mg/dL — ABNORMAL HIGH (ref 70–99)
Total Protein: 7.3 g/dL (ref 6.0–8.3)

## 2012-07-02 LAB — URINALYSIS, ROUTINE W REFLEX MICROSCOPIC
Ketones, ur: NEGATIVE mg/dL
Leukocytes, UA: NEGATIVE
Nitrite: NEGATIVE
Specific Gravity, Urine: 1.006 (ref 1.005–1.030)
Urobilinogen, UA: 0.2 mg/dL (ref 0.0–1.0)
pH: 6 (ref 5.0–8.0)

## 2012-07-02 LAB — CBC WITH DIFFERENTIAL/PLATELET
Basophils Absolute: 0 10*3/uL (ref 0.0–0.1)
Eosinophils Absolute: 0.1 10*3/uL (ref 0.0–0.7)
Eosinophils Relative: 1 % (ref 0–5)
Lymphocytes Relative: 23 % (ref 12–46)
Lymphs Abs: 1.3 10*3/uL (ref 0.7–4.0)
MCH: 34.5 pg — ABNORMAL HIGH (ref 26.0–34.0)
MCV: 96.3 fL (ref 78.0–100.0)
Neutrophils Relative %: 65 % (ref 43–77)
Platelets: 195 10*3/uL (ref 150–400)
RBC: 4.81 MIL/uL (ref 4.22–5.81)
RDW: 14.2 % (ref 11.5–15.5)
WBC: 5.8 10*3/uL (ref 4.0–10.5)

## 2012-07-02 LAB — ETHANOL: Alcohol, Ethyl (B): <11 mg/dL (ref 0–11)

## 2012-07-02 MED ORDER — ONDANSETRON HCL 8 MG PO TABS: 4.0000 mg | ORAL_TABLET | Freq: Three times a day (TID) | ORAL | Status: DC | PRN

## 2012-07-02 MED ORDER — LORAZEPAM 2 MG/ML IJ SOLN
1.0000 mg | Freq: Four times a day (QID) | INTRAMUSCULAR | Status: DC | PRN
  Administered 2012-07-03: 1 mg via INTRAVENOUS
  Filled 2012-07-02: qty 1

## 2012-07-02 MED ORDER — VITAMIN B-1 100 MG PO TABS
100.0000 mg | ORAL_TABLET | Freq: Every day | ORAL | Status: DC
  Administered 2012-07-02 – 2012-07-03 (×2): 100 mg via ORAL
  Filled 2012-07-02: qty 1

## 2012-07-02 MED ORDER — ACETAMINOPHEN 325 MG PO TABS
650.0000 mg | ORAL_TABLET | ORAL | Status: DC | PRN
  Administered 2012-07-03: 650 mg via ORAL
  Filled 2012-07-02: qty 2

## 2012-07-02 MED ORDER — FOLIC ACID 1 MG PO TABS
1.0000 mg | ORAL_TABLET | Freq: Every day | ORAL | Status: DC
  Administered 2012-07-02 – 2012-07-03 (×2): 1 mg via ORAL
  Filled 2012-07-02 (×2): qty 1

## 2012-07-02 MED ORDER — THIAMINE HCL 100 MG/ML IJ SOLN: 100.0000 mg | Freq: Every day | INTRAMUSCULAR | Status: DC

## 2012-07-02 MED ORDER — ZOLPIDEM TARTRATE 5 MG PO TABS: 5.0000 mg | ORAL_TABLET | Freq: Every evening | ORAL | Status: DC | PRN

## 2012-07-02 MED ORDER — LORAZEPAM 2 MG/ML IJ SOLN: 0.0000 mg | Freq: Two times a day (BID) | INTRAMUSCULAR | Status: DC

## 2012-07-02 MED ORDER — MORPHINE SULFATE 4 MG/ML IJ SOLN
4.0000 mg | Freq: Once | INTRAMUSCULAR | Status: AC
  Administered 2012-07-02: 4 mg via INTRAVENOUS
  Filled 2012-07-02: qty 1

## 2012-07-02 MED ORDER — IBUPROFEN 200 MG PO TABS
600.0000 mg | ORAL_TABLET | Freq: Three times a day (TID) | ORAL | Status: DC | PRN
  Administered 2012-07-03: 600 mg via ORAL
  Filled 2012-07-02: qty 1
  Filled 2012-07-02: qty 3

## 2012-07-02 MED ORDER — PANTOPRAZOLE SODIUM 40 MG IV SOLR
40.0000 mg | Freq: Once | INTRAVENOUS | Status: AC
  Administered 2012-07-02: 40 mg via INTRAVENOUS
  Filled 2012-07-02: qty 40

## 2012-07-02 MED ORDER — VITAMIN B-1 100 MG PO TABS
50.0000 mg | ORAL_TABLET | Freq: Once | ORAL | Status: AC
  Administered 2012-07-02: 50 mg via ORAL
  Filled 2012-07-02: qty 2

## 2012-07-02 MED ORDER — ADULT MULTIVITAMIN W/MINERALS CH
1.0000 | ORAL_TABLET | Freq: Every day | ORAL | Status: DC
  Administered 2012-07-02 – 2012-07-03 (×2): 1 via ORAL
  Filled 2012-07-02 (×2): qty 1

## 2012-07-02 MED ORDER — ALUM & MAG HYDROXIDE-SIMETH 200-200-20 MG/5ML PO SUSP: 30.0000 mL | ORAL | Status: DC | PRN

## 2012-07-02 MED ORDER — NICOTINE 21 MG/24HR TD PT24
21.0000 mg | MEDICATED_PATCH | Freq: Every day | TRANSDERMAL | Status: DC
  Administered 2012-07-02 – 2012-07-03 (×2): 21 mg via TRANSDERMAL
  Filled 2012-07-02 (×2): qty 1

## 2012-07-02 MED ORDER — LORAZEPAM 2 MG/ML IJ SOLN: 0.0000 mg | Freq: Four times a day (QID) | INTRAMUSCULAR | Status: DC

## 2012-07-02 MED ORDER — LORAZEPAM 1 MG PO TABS: 1.0000 mg | ORAL_TABLET | Freq: Four times a day (QID) | ORAL | Status: DC | PRN

## 2012-07-02 MED ORDER — SODIUM CHLORIDE 0.9 % IV BOLUS (SEPSIS)
1000.0000 mL | INTRAVENOUS | Status: AC
  Administered 2012-07-02: 1000 mL via INTRAVENOUS

## 2012-07-02 NOTE — ED Notes (Signed)
Hessie Diener, RN ordered dinner tray for pt

## 2012-07-02 NOTE — ED Provider Notes (Signed)
History     CSN: [id number]  Arrival date & time 07/02/12  1532   First MD Initiated Contact with Patient 07/02/12 1543      Chief Complaint  Patient presents with  . Abdominal Pain  . Rectal Bleeding    (Consider location/radiation/quality/duration/timing/severity/associated sxs/prior treatment) HPI Comments: This is a 52 year old male, past medical history remarkable for hep C, and pancreatitis, who presents emergency department with chief complaint of abdominal pain. Patient states that he has been having worsening abdominal pain for the past week. He states his pain is 8/10. He is tried taking hydrocodone with little relief. Patient states that he has been drinking 4-40 ounce beers per day. Additionally he endorses bright red blood per rectum. He states that he is noticed the blood on the toilet paper and a little bit in the bowl. He has never had a colonoscopy. Patient denies headaches, chest pain, shortness of breath, nausea, vomiting, diarrhea, constipation, numbness and tingling of the extremities.  The history is provided by the patient. No language interpreter was used.    Past Medical History  Diagnosis Date  . 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 Back pain   . Pancreatitis     Past Surgical History  Procedure Date  . Knee surgery   . Neck surgery     No family history on file.  History  Substance Use Topics  . Smoking status: Current Every Day Smoker  . Smokeless tobacco: Not on file  . Alcohol Use: Yes     Comment: alcoholic (4 40's a day)      Review of Systems  All other systems reviewed and are negative.    Allergies  Penicillins  Home Medications   Current Outpatient Rx  Name  Route  Sig  Dispense  Refill  . HYDROCODONE-ACETAMINOPHEN 5-500 MG PO TABS   Oral   Take 1 tablet by mouth 2 (two) times daily.           BP 180/98  Pulse 116  Temp 98.5 F (36.9 C) (Oral)  Resp 16  SpO2 98%  Physical Exam  Nursing note and vitals  reviewed. Constitutional: He is oriented to person, place, and time. He appears well-developed and well-nourished.  HENT:  Head: Normocephalic and atraumatic.  Mouth/Throat: No oropharyngeal exudate.  Eyes: Conjunctivae normal and EOM are normal. Pupils are equal, round, and reactive to light. Right eye exhibits no discharge. Left eye exhibits no discharge. No scleral icterus.  Neck: Normal range of motion. Neck supple. No JVD present. Thyromegaly: .now.  Cardiovascular: Regular rhythm, normal heart sounds and intact distal pulses.  Exam reveals no gallop and no friction rub.   No murmur heard.      Mildly tachycardic  Pulmonary/Chest: Effort normal and breath sounds normal. No respiratory distress. He has no wheezes. He has no rales. He exhibits no tenderness.  Abdominal: Soft. Bowel sounds are normal. He exhibits no distension and no mass. There is tenderness. There is no rebound and no guarding.       Abdomen is tender to palpation in the epigastric region, no right upper quadrant tenderness or Murphy sign, no right lower quadrant tenderness or pain at McBurney's point or rebound tenderness, no peritoneal signs.  Genitourinary:       DRE reveals internal and external hemorrhoids, soft brown stool in the rectal vault, no signs of gross blood, good rectal tone.  Musculoskeletal: Normal range of motion. He exhibits no edema and no tenderness.  Neurological: He  is alert and oriented to person, place, and time.  Skin: Skin is warm and dry.  Psychiatric: He has a normal mood and affect. His behavior is normal. Judgment and thought content normal.    ED Course  Procedures (including critical care time)   Labs Reviewed  CBC WITH DIFFERENTIAL  COMPREHENSIVE METABOLIC PANEL  URINALYSIS, ROUTINE W REFLEX MICROSCOPIC  LIPASE, BLOOD  OCCULT BLOOD X 1 CARD TO LAB, STOOL   No results found.   No diagnosis found.    MDM   52 year old male with abdominal pain. Patient has past history of  pancreatitis, he states he has recently been sober for past 3 months, but recently started drinking last week. He has been drinking a lot, and I suspect that his abdominal pain is likely due to pancreatitis flareup. Will obtain routine labs and give fluids and pain medicine. Believe the patient's rectal bleeding to be due to hemorrhoids, however will obtain FOBT.  4:13 PM FOBT is negative.  Labs are reassuring, doubt any GI bleed.  I believe the source of the blood, (no blood visualized on my exam) to be the visible hemorrhoids.    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  Patient's lipase is normal, and he has not had vomiting or nausea.  He is able to tolerate PO. Patient states that he would like to be admitted for etoh detox.  I will consult the ACT team and place psych temp orders and CIWA orders.  I have discussed this patient with Dr. Macon Large, who agrees with the plan.  6:47 PM Discussed with the ACT team.  Will evaluate for detox.   Roxy Horseman, PA-C 07/02/12 [phone]

## 2012-07-02 NOTE — ED Notes (Signed)
Pt resting quietly with eyes closed, easily arousable and  no s/s of any pain or distress observed. Pt denies any pain or complaints at this time. Pt vitals taken and in good range, plan of care is updated with verbal understanding and will continue to monitor pt.

## 2012-07-02 NOTE — ED Notes (Signed)
Pt with hx of pancreatitis has been having abdominal pain for a week, this pain has been increasing.  Pt states that he began drinking alcohol again a month ago after 3 months of sobriety.  Pt also has been having rectal bleeding (bright red when he has BM and when he wipes) for a week.  Pt has been feeling weak and has been having sob.  Pt denies any N/v.  Abdominal pain is in upper abdomen and feels like pancreatitis to pt.

## 2012-07-02 NOTE — ED Notes (Signed)
Patient wanded by security. Paper scrubs given to patient

## 2012-07-03 NOTE — BH Assessment (Signed)
[facility] Assessment Progress Note      07/03/12 1945.  Pt has been declined by both [facility] and [facility] due to pending court appearance for DWI on 07/06/12.  Discussed with with Dr Estell Harpin of [facility].  Pt will be referred to [facility], who currently has no beds for possible detox.  Discussed pt with Pod C RN Irving Burton who reports that pt has had CIWA near zero three out of four times today.  Pt had one CIWA of 8 earlier today and was given 1mg  ativan.  Most recent CIWA was back to 0.  If pt continues to show no withdrawals then discuss with MD about possible discharge. Daleen Squibb, LCSWA

## 2012-07-03 NOTE — ED Notes (Signed)
Pt showered

## 2012-07-03 NOTE — ED Provider Notes (Signed)
Medical screening examination/treatment/procedure(s) were performed by non-physician practitioner and as supervising physician I was immediately available for consultation/collaboration.  Kelis Plasse, MD 07/03/12 1601 

## 2012-07-03 NOTE — BHH Counselor (Signed)
Writer spoke with [name] at [facility] and was told that pt's referral will probably be look at later today. Ranae Pila, LCAS

## 2012-07-03 NOTE — ED Notes (Signed)
ACT team at bedside.  

## 2012-07-03 NOTE — ED Notes (Signed)
[name] from [facility] called. Stated they would not be able to accept this patient due to he has medicaid and has a court appearance on the 29th.

## 2012-07-03 NOTE — BH Assessment (Signed)
Assessment Note   Patient is a 52 year old white male.  Patient  reports that he has been abusing alcohol for the past 30 years.  Patient denies using any other drugs.  However, the patients UDS was positive for cocaine and his BAL was <11.  Patient requests detox from alcohol.  Patient reports withdrawal symptoms that include chills, sweats, irritability and slight nausea.  Patient reports drinking 4-5 (40oz) of beer daily.    Patient denies a history of black out or seizures when he drinks.  Patient reports that his last drink was yesterday.  Patient denies any prior substance abuse detox or treatment.  Patient reports an upcoming DWI court case on 07-06-2012.    Patient reports feelings of depression, hopelessness and helplessness.  Patient denies any history of past mental health involvement.  Patient denies any prior psychiatric hospitalizations.  Patient denies psychosis.  Patient denies any SI/HI.     Axis I: Alcohol Dependence and Depressive Disorder  Axis II: Deferred Axis III:  Past Medical History  Diagnosis Date  . 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 Back pain   . Pancreatitis    Axis IV: economic problems, educational problems, housing problems, occupational problems, other psychosocial or environmental problems, problems related to legal system/crime, problems related to social environment, problems with access to health care services and problems with primary support group Axis V: 51-60 moderate symptoms  Past Medical History:  Past Medical History  Diagnosis Date  . 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 Back pain   . Pancreatitis     Past Surgical History  Procedure Date  . Knee surgery   . Neck surgery     Family History: History reviewed. No pertinent family history.  Social History:  reports that he has been smoking.  He does not have any smokeless tobacco history on file. He reports that he drinks alcohol. His drug history not on file.  Additional Social History:  Alcohol / Drug  Use History of alcohol / drug use?: Yes Substance #1 Name of Substance 1: Alcohol  1 - Age of First Use: Early 20's 1 - Amount (size/oz): varies  He reports 4-5 (40oz) of beer 1 - Frequency: Daily 1 - Duration: For the past 30 years  1 - Last Use / Amount: yesterday   CIWA: CIWA-Ar BP: 134/85 mmHg Pulse Rate: 86  Nausea and Vomiting: no nausea and no vomiting Tactile Disturbances: none Tremor: no tremor Auditory Disturbances: not present Paroxysmal Sweats: no sweat visible Visual Disturbances: not present Anxiety: no anxiety, at ease Headache, Fullness in Head: none present Agitation: normal activity Orientation and Clouding of Sensorium: oriented and can do serial additions CIWA-Ar Total: 0  COWS:    Allergies:  Allergies  Allergen Reactions  . Penicillins Other (See Comments)    Reaction as a child-unknown    Home Medications:  (Not in a hospital admission)  OB/GYN Status:  No LMP for male patient.  General Assessment Data Location of Assessment: [facility] ED ACT Assessment: Yes Living Arrangements: Parent Can pt return to current living arrangement?: Yes Admission Status: Voluntary Is patient capable of signing voluntary admission?: Yes Transfer from: Acute Hospital Referral Source: Self/Family/Friend     Risk to self Suicidal Ideation: No Suicidal Intent: No Is patient at risk for suicide?: No Suicidal Plan?: No Access to Means: No What has been your use of drugs/alcohol within the last 12 months?: Patient reports alcohol only but he tested positive for cocaine and <  11 for alcohol.  Previous Attempts/Gestures: No How many times?: 0  Other Self Harm Risks: None Reported Triggers for Past Attempts: None known Intentional Self Injurious Behavior: None Family Suicide History: Yes;No Recent stressful life event(s): Conflict (Comment);Job Loss;Financial Problems;Legal Issues;Turmoil (Comment) Persecutory voices/beliefs?: No Depression: Yes Depression Symptoms:  Isolating;Fatigue;Guilt;Feeling worthless/self pity Substance abuse history and/or treatment for substance abuse?: Yes Suicide prevention information given to non-admitted patients: Not applicable  Risk to Others Homicidal Ideation: No Thoughts of Harm to Others: No Current Homicidal Intent: No Current Homicidal Plan: No Access to Homicidal Means: No Identified Victim: None Reported History of harm to others?: No Assessment of Violence: None Noted Violent Behavior Description: None  Does patient have access to weapons?: No Criminal Charges Pending?: Yes Describe Pending Criminal Charges: 3 counts of DWI Does patient have a court date: Yes Court Date: 07/06/12  Psychosis Hallucinations: None noted Delusions: None noted  Mental Status Report Appear/Hygiene: Disheveled Eye Contact: Poor Motor Activity: Freedom of movement Speech: Soft Level of Consciousness: Alert Mood: Depressed Affect: Depressed Anxiety Level: None Thought Processes: Coherent Judgement: Unimpaired Orientation: Person;Place;Time;Situation Obsessive Compulsive Thoughts/Behaviors: None  Cognitive Functioning Concentration: Decreased Memory: Recent Intact;Remote Intact IQ: Average Insight: Poor Impulse Control: Poor Appetite: Poor Weight Loss: 0  Weight Gain: 0  Sleep: Decreased Total Hours of Sleep: 4  Vegetative Symptoms: None  ADLScreening [facility] Assessment Services) Patient's cognitive ability adequate to safely complete daily activities?: Yes Patient able to express need for assistance with ADLs?: Yes Independently performs ADLs?: Yes (appropriate for developmental age)  Abuse/Neglect [facility]) Physical Abuse: Denies Verbal Abuse: Denies Sexual Abuse: Denies  Prior Inpatient Therapy Prior Inpatient Therapy: No Prior Therapy Dates: na Prior Therapy Facilty/Provider(s): na Reason for Treatment: na  Prior Outpatient Therapy Prior Outpatient Therapy: No Prior Therapy Dates: na Prior Therapy  Facilty/Provider(s): na Reason for Treatment: na  ADL Screening (condition at time of admission) Patient's cognitive ability adequate to safely complete daily activities?: Yes Patient able to express need for assistance with ADLs?: Yes Independently performs ADLs?: Yes (appropriate for developmental age)       Abuse/Neglect Assessment (Assessment to be complete while patient is alone) Physical Abuse: Denies Verbal Abuse: Denies Sexual Abuse: Denies Values / Beliefs Cultural Requests During Hospitalization: None Spiritual Requests During Hospitalization: None        Additional Information 1:1 In Past 12 Months?: No CIRT Risk: No Elopement Risk: No Does patient have medical clearance?: Yes     Disposition: Pending placement at [facility]  Disposition Disposition of Patient: Referred to Patient referred to: Other (Comment)  On Site Evaluation by:   Reviewed with Physician:     Phillip Heal LaVerne 07/03/2012 4:54 AM

## 2012-07-03 NOTE — ED Provider Notes (Signed)
The pt has a court date this week and most detox centers will not take him if he has a court date.  The act team will try to get him a bed for detox tomorrow,  If there is no bed then he should be discharged for out pt treatment  Benny Lennert, MD 07/03/12 1943

## 2012-07-04 NOTE — BH Assessment (Signed)
Assessment Note  Update:  Pt currently denies alcohol withdrawal sx and does not meet criteria for detox.  Informed pt he was declined at [facility] and [facility] due to is upcoming court date on 07/06/12.  Pt stated it was recommended he follow up with [facility], but he cannot afford it.  Pt given referral to [facility] to follow up with treatment.  EDP Davidson in agreement with this.  Pt stated he may come back to the ED for treatment if he is not successful on his own after his court date.  Updated assessment disposition, completed assessment notification, and faxed to [facility] to log.  Updated ED staff.     Disposition:  Disposition Disposition of Patient: Referred to;Outpatient treatment Type of outpatient treatment: Adult Patient referred to: Outpatient clinic referral [facility])  On Site Evaluation by:   Reviewed with Physician:  Bryson Ha, Rennis Harding 07/04/2012 9:35 AM

## 2012-07-04 NOTE — Progress Notes (Signed)
8:01 AM Pt is not shaky, no alcohol for 2 days.  ? Need to go to detox, will ask ACT to re-evaluate pt.

## 2012-07-04 NOTE — ED Notes (Signed)
Belongings from locker # 12 returned to patient.

## 2012-07-04 NOTE — ED Notes (Signed)
Pt discharged home. No signs of withdrawal. Pt calm, cooperative. Skin warm and dry. Denies nausea or h/a. No tremor noted. Speech is clear and concise.

## 2012-07-04 NOTE — ED Notes (Signed)
Pt eating breakfast tray 

## 2012-07-04 NOTE — Progress Notes (Signed)
9:07 AM Pt was declined for detox at [facility] and [facility] because of court date.  Released, will plan to followup at [facility] or return after his court date.

## 2012-07-05 ENCOUNTER — Emergency Department (HOSPITAL_COMMUNITY)
Admission: EM | Admit: 2012-07-05 | Discharge: 2012-07-05 | Disposition: A | Discharge status: Home / Self Care | Payer: Self-pay | Attending: Emergency Medicine | Admitting: Emergency Medicine

## 2012-07-05 ENCOUNTER — Encounter (HOSPITAL_COMMUNITY): Payer: Self-pay | Admitting: Emergency Medicine

## 2012-07-05 DIAGNOSIS — Z8619 Personal history of other infectious and parasitic diseases: Secondary | ICD-10-CM | POA: Insufficient documentation

## 2012-07-05 DIAGNOSIS — Z8719 Personal history of other diseases of the digestive system: Secondary | ICD-10-CM | POA: Insufficient documentation

## 2012-07-05 DIAGNOSIS — F1010 Alcohol abuse, uncomplicated: Secondary | ICD-10-CM | POA: Insufficient documentation

## 2012-07-05 DIAGNOSIS — F17200 Nicotine dependence, unspecified, uncomplicated: Secondary | ICD-10-CM | POA: Insufficient documentation

## 2012-07-05 DIAGNOSIS — Z79899 Other long term (current) drug therapy: Secondary | ICD-10-CM | POA: Insufficient documentation

## 2012-07-05 NOTE — ED Notes (Signed)
Here for detox from alcohol drinks 4 40oz beer a day for 30 years.  Denies SI or HI. Last drank last night.

## 2012-07-05 NOTE — ED Notes (Signed)
Discharge instruction reviewed. Pt verbalized understanding.  

## 2012-07-05 NOTE — ED Provider Notes (Signed)
History     CSN: [id number]  Arrival date & time 07/05/12  5621   First MD Initiated Contact with Patient 07/05/12 1122      Chief Complaint  Patient presents with  . Alcohol Problem    (Consider location/radiation/quality/duration/timing/severity/associated sxs/prior treatment) HPI Comments: Patient is a 52 year old male here for detox from alcohol. He reports drinking up to 4-40 oz beers per day for the past 30 years. He drank beer last night. He denies any other substance abuse. He denies SI/HI. He denies any history of depression, anxiety, or other psychiatric diagnoses. Patient has been seen in the ED multiple times for alcohol detox and has been seen by ACT yesterday. Patient denies any change in his condition since he was last seen by ACT.   Patient is a 52 y.o. male presenting with alcohol problem.  Alcohol Problem    Past Medical History  Diagnosis Date  . 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 Back pain   . Pancreatitis     Past Surgical History  Procedure Date  . Knee surgery   . Neck surgery     No family history on file.  History  Substance Use Topics  . Smoking status: Current Every Day Smoker  . Smokeless tobacco: Not on file  . Alcohol Use: Yes     Comment: alcoholic (4 40's a day)      Review of Systems  Psychiatric/Behavioral:       Substance abuse.  All other systems reviewed and are negative.    Allergies  Penicillins  Home Medications   Current Outpatient Rx  Name  Route  Sig  Dispense  Refill  . GOODY HEADACHE PO   Oral   Take 2 packets by mouth daily as needed. For pain         . HYDROCODONE-ACETAMINOPHEN 5-500 MG PO TABS   Oral   Take 1 tablet by mouth 2 (two) times daily.           BP 158/100  Pulse 84  Temp 97.1 F (36.2 C) (Oral)  Resp 20  SpO2 97%  Physical Exam  Nursing note and vitals reviewed. Constitutional: He appears well-developed and well-nourished. No distress.  HENT:  Head: Normocephalic and atraumatic.    Eyes: Conjunctivae normal are normal.  Neck: Normal range of motion. Neck supple.  Cardiovascular: Normal rate and regular rhythm.  Exam reveals no gallop and no friction rub.   No murmur heard. Pulmonary/Chest: Effort normal and breath sounds normal. He has no wheezes. He has no rales. He exhibits no tenderness.  Abdominal: Soft. There is no tenderness.  Musculoskeletal: Normal range of motion.  Neurological: He is alert.       Speech is goal-oriented. Moves limbs without ataxia.   Skin: Skin is warm and dry.  Psychiatric: He has a normal mood and affect. His behavior is normal.    ED Course  Procedures (including critical care time)  Labs Reviewed - No data to display No results found.   1. Alcohol abuse       MDM  2:36 PM Patient here for rehab placement but ACT team saw him yesterday and gave him resources. ACT team says there is nothing to be done for him here. No changes since the recent encounter. Patient has a court date tomorrow that he is trying to avoid. Patient given resources for treatment centers. Patient will be discharged without further evaluation. Resources provided to the patient for a second time.  Emilia Beck, PA-C 07/06/12 1213

## 2012-07-07 NOTE — ED Provider Notes (Signed)
Medical screening examination/treatment/procedure(s) were performed by non-physician practitioner and as supervising physician I was immediately available for consultation/collaboration.  Doug Sou, MD 07/07/12 [phone]

## 2013-10-23 ENCOUNTER — Emergency Department (HOSPITAL_COMMUNITY)
Admission: EM | Admit: 2013-10-23 | Discharge: 2013-10-23 | Disposition: A | Discharge status: Home / Self Care | Payer: Medicaid Other | Attending: Emergency Medicine | Admitting: Emergency Medicine

## 2013-10-23 ENCOUNTER — Encounter (HOSPITAL_COMMUNITY): Payer: Self-pay | Admitting: Emergency Medicine

## 2013-10-23 DIAGNOSIS — Z8619 Personal history of other infectious and parasitic diseases: Secondary | ICD-10-CM | POA: Insufficient documentation

## 2013-10-23 DIAGNOSIS — Z8719 Personal history of other diseases of the digestive system: Secondary | ICD-10-CM | POA: Insufficient documentation

## 2013-10-23 DIAGNOSIS — M542 Cervicalgia: Secondary | ICD-10-CM | POA: Insufficient documentation

## 2013-10-23 DIAGNOSIS — Z880 Allergy status to penicillin: Secondary | ICD-10-CM | POA: Insufficient documentation

## 2013-10-23 DIAGNOSIS — M549 Dorsalgia, unspecified: Secondary | ICD-10-CM | POA: Insufficient documentation

## 2013-10-23 DIAGNOSIS — F17200 Nicotine dependence, unspecified, uncomplicated: Secondary | ICD-10-CM | POA: Insufficient documentation

## 2013-10-23 DIAGNOSIS — Z9889 Other specified postprocedural states: Secondary | ICD-10-CM | POA: Insufficient documentation

## 2013-10-23 DIAGNOSIS — G8929 Other chronic pain: Secondary | ICD-10-CM | POA: Insufficient documentation

## 2013-10-23 DIAGNOSIS — F10229 Alcohol dependence with intoxication, unspecified: Secondary | ICD-10-CM | POA: Insufficient documentation

## 2013-10-23 DIAGNOSIS — Z79899 Other long term (current) drug therapy: Secondary | ICD-10-CM | POA: Insufficient documentation

## 2013-10-23 DIAGNOSIS — R030 Elevated blood-pressure reading, without diagnosis of hypertension: Secondary | ICD-10-CM | POA: Insufficient documentation

## 2013-10-23 DIAGNOSIS — F1910 Other psychoactive substance abuse, uncomplicated: Secondary | ICD-10-CM | POA: Insufficient documentation

## 2013-10-23 LAB — CBC
HCT: 48 % (ref 39.0–52.0)
HEMOGLOBIN: 16.5 g/dL (ref 13.0–17.0)
MCH: 32.7 pg (ref 26.0–34.0)
MCHC: 34.4 g/dL (ref 30.0–36.0)
MCV: 95.2 fL (ref 78.0–100.0)
PLATELETS: 259 10*3/uL (ref 150–400)
RBC: 5.04 MIL/uL (ref 4.22–5.81)
RDW: 16.6 % — ABNORMAL HIGH (ref 11.5–15.5)
WBC: 6.7 10*3/uL (ref 4.0–10.5)

## 2013-10-23 LAB — COMPREHENSIVE METABOLIC PANEL
ALK PHOS: 74 U/L (ref 39–117)
ALT: 19 U/L (ref 0–53)
AST: 32 U/L (ref 0–37)
Albumin: 3.7 g/dL (ref 3.5–5.2)
BILIRUBIN TOTAL: 0.4 mg/dL (ref 0.3–1.2)
BUN: 5 mg/dL — ABNORMAL LOW (ref 6–23)
CHLORIDE: 97 meq/L (ref 96–112)
CO2: 18 meq/L — AB (ref 19–32)
Calcium: 9.1 mg/dL (ref 8.4–10.5)
Creatinine, Ser: 0.74 mg/dL (ref 0.50–1.35)
GLUCOSE: 88 mg/dL (ref 70–99)
POTASSIUM: 4.2 meq/L (ref 3.7–5.3)
SODIUM: 136 meq/L — AB (ref 137–147)
Total Protein: 7.3 g/dL (ref 6.0–8.3)

## 2013-10-23 LAB — ETHANOL: Alcohol, Ethyl (B): 197 mg/dL — ABNORMAL HIGH (ref 0–11)

## 2013-10-23 LAB — RAPID URINE DRUG SCREEN, HOSP PERFORMED
AMPHETAMINES: NOT DETECTED
BARBITURATES: NOT DETECTED
Benzodiazepines: NOT DETECTED
Cocaine: POSITIVE — AB
OPIATES: NOT DETECTED
TETRAHYDROCANNABINOL: NOT DETECTED

## 2013-10-23 LAB — SALICYLATE LEVEL: Salicylate Lvl: 7.7 mg/dL (ref 2.8–20.0)

## 2013-10-23 LAB — ACETAMINOPHEN LEVEL: Acetaminophen (Tylenol), Serum: <15 ug/mL (ref 10–30)

## 2013-10-23 NOTE — ED Notes (Addendum)
Pt reports last drank ETOH this morning 40 oz beer. Requesting detox from ETOH, denies SI or HI. Pt in paper scrubs

## 2013-10-23 NOTE — Progress Notes (Signed)
  CARE MANAGEMENT ED NOTE 10/23/2013  Patient:  [name], [name]   Account Number:  [id number]  Date Initiated:  10/23/2013  Documentation initiated by:  Edwyna Shell  Subjective/Objective Assessment:   71 to male presenting to the ED requesting detox and c/o chronic pain     Subjective/Objective Assessment Detail:   HPI  Patient reports he's been using alcohol for the past 3 weeks in an attempt to treat chronic back and neck pain which have been worse since taking and landscaping. Pain is nonradiating. He denies fever denies loss of bladder or bowel control. He's been using ear to treat his pain. He is also treated himself with Goody powder, without relief. He last drink beer this morning. Nothing makes symptoms better or worse. No other associated symptoms.     Action/Plan:   Patient is to follow up with [facility] for PCP or financial counselor or another listed PCP resource that was given to establish primary care   Action/Plan Detail:   Anticipated DC Date:       Status Recommendation to Physician:   Result of Recommendation:  Agreed    DC Planning Services  CM consult  PCP issues  Other    Choice offered to / List presented to:            Status of service:  Completed, signed off  ED Comments:   ED Comments Detail:  CM spoke with patient reagrding no documented PCP or insurance coverage. Patient stated that he lost his insurance coverage 2 years ago when he lost his Medicaid. He states that his method of transportation is with a scooter and he has minimal financial resources to cover medical care. Unable to contact to contact [facility] due to busy phones so provided resource information to patient to establish care with PCP and/or financial counselor. Patient verbalized understanding and was able to verbalize back the information that was received. EDP made aware of resources provided.

## 2013-10-23 NOTE — ED Notes (Addendum)
Pt reports he was digging in the yard last week and his lower back, hips and neck have been hurting since. He reports a history of back pain and back surgery. Ambulatory, mae. He states since he started hurting he started drinking heavily again and would also like detox from alcohol. He denies SI/HI. He denies any other substance use

## 2013-10-23 NOTE — Progress Notes (Addendum)
CSW consult to pt regarding information for inpatient detox vs outpt intensive treatment for Substance Abuse.CSW consulted [facility] who recommended [facility] [phone]. Pt given resources. Pt voiced understanding. Also, CSW gave pt resource on attorney services to appeal disability benefits. Pt denied SI/HI/AVH. Pt reported that he drank a 40 oz beer this morning. Pt reported that he has not been experiencing any withdrawal symptoms. Pt to be discharge with outpt services.   Juliann Mule [phone]

## 2013-10-23 NOTE — ED Notes (Signed)
Social work reports they have planned follow up for patient and he can go to [employer] and wellness.

## 2013-10-23 NOTE — ED Notes (Signed)
Social work at bedside.  

## 2013-10-23 NOTE — Discharge Instructions (Signed)
Drug Abuse and Addiction  See the wellness Center or contact any of the agencies furnished to you to get help with her drug and alcohol problem. The wellness Center can act as a primary care physician for you. Your blood pressure should be checked again within the next one or 2 weeks. Today's is elevated at 155/102 There are many types of drugs that one may become addicted to including illegal drugs (marijuana, cocaine, amphetamines, hallucinogens, and narcotics), prescription drugs (hydrocodone, codeine, and alprazolam), and other chemicals such as alcohol or nicotine. Two types of addiction exist: physical and emotional. Physical addiction usually occurs after prolonged use of a drug. However, some drugs may only take a couple uses before addiction can occur. Physical addiction is marked by withdrawal symptoms, in which the person experiences negative symptoms such as sweat, anxiety, tremors, hallucinations, or cravings in the absence of using the drug. Emotional dependence is the psychological desire for the "high" that the drugs produce when taken. SYMPTOMS   Inattentiveness.  Negligence.  Forgetfulness.  Insomnia.  Mood swings. RISK INCREASES WITH:   Family history of addiction.  Personal history of addictive personality. Studies have shown that risktakers, which many athletes are, have a higher risk of addiction. PREVENTION The only adequate prevention of drug abuse is abstinence from drugs. TREATMENT  The first step in quitting substance abuse is recognizing the problem and realizing that one has the power to change. Quitting requires a plan and support from others. It is often necessary to seek medical assistance. Caregivers are available to offer counseling, and for certain cases, medicine to diminish the physical symptoms of withdrawal. Many organizations exist such as Alcoholics Anonymous, Narcotics Anonymous, or the [employer] on Alcoholism that offer support for  individuals who have chosen to quit their habits. Document Released: 05/25/2005 Document Revised: 08/17/2011 Document Reviewed: 09/06/2008 [facility] Patient Information 2014 [place], [place]. Alcohol Problems Most adults who drink alcohol drink in moderation (not a lot) are at low risk for developing problems related to their drinking. However, all drinkers, including low-risk drinkers, should know about the health risks connected with drinking alcohol. RECOMMENDATIONS FOR LOW-RISK DRINKING  Drink in moderation. Moderate drinking is defined as follows:   Men - no more than 2 drinks per day.  Nonpregnant women - no more than 1 drink per day.  Over age 45 - no more than 1 drink per day. A standard drink is 12 grams of pure alcohol, which is equal to a 12 ounce bottle of beer or wine cooler, a 5 ounce glass of wine, or 1.5 ounces of distilled spirits (such as whiskey, brandy, vodka, or rum).  ABSTAIN FROM (DO NOT DRINK) ALCOHOL:  When pregnant or considering pregnancy.  When taking a medication that interacts with alcohol.  If you are alcohol dependent.  A medical condition that prohibits drinking alcohol (such as ulcer, liver disease, or heart disease). DISCUSS WITH YOUR CAREGIVER:  If you are at risk for coronary heart disease, discuss the potential benefits and risks of alcohol use: Light to moderate drinking is associated with lower rates of coronary heart disease in certain populations (for example, men over age 47 and postmenopausal women). Infrequent or nondrinkers are advised not to begin light to moderate drinking to reduce the risk of coronary heart disease so as to avoid creating an alcohol-related problem. Similar protective effects can likely be gained through proper diet and exercise.  Women and the elderly have smaller amounts of body water than men. As a result women  and the elderly achieve a higher blood alcohol concentration after drinking the same amount of  alcohol.  Exposing a fetus to alcohol can cause a broad range of birth defects referred to as Fetal Alcohol Syndrome (FAS) or Alcohol-Related Birth Defects (ARBD). Although FAS/ARBD is connected with excessive alcohol consumption during pregnancy, studies also have reported neurobehavioral problems in infants born to mothers reporting drinking an average of 1 drink per day during pregnancy.  Heavier drinking (the consumption of more than 4 drinks per occasion by men and more than 3 drinks per occasion by women) impairs learning (cognitive) and psychomotor functions and increases the risk of alcohol-related problems, including accidents and injuries. CAGE QUESTIONS:   Have you ever felt that you should Cut down on your drinking?  Have people Annoyed you by criticizing your drinking?  Have you ever felt bad or Guilty about your drinking?  Have you ever had a drink first thing in the morning to steady your nerves or get rid of a hangover (Eye opener)? If you answered positively to any of these questions: You may be at risk for alcohol-related problems if alcohol consumption is:   Men: Greater than 14 drinks per week or more than 4 drinks per occasion.  Women: Greater than 7 drinks per week or more than 3 drinks per occasion. Do you or your family have a medical history of alcohol-related problems, such as:  Blackouts.  Sexual dysfunction.  Depression.  Trauma.  Liver dysfunction.  Sleep disorders.  Hypertension.  Chronic abdominal pain.  Has your drinking ever caused you problems, such as problems with your family, problems with your work (or school) performance, or accidents/injuries?  Do you have a compulsion to drink or a preoccupation with drinking?  Do you have poor control or are you unable to stop drinking once you have started?  Do you have to drink to avoid withdrawal symptoms?  Do you have problems with withdrawal such as tremors, nausea, sweats, or mood  disturbances?  Does it take more alcohol than in the past to get you high?  Do you feel a strong urge to drink?  Do you change your plans so that you can have a drink?  Do you ever drink in the morning to relieve the shakes or a hangover? If you have answered a number of the previous questions positively, it may be time for you to talk to your caregivers, family, and friends and see if they think you have a problem. Alcoholism is a chemical dependency that keeps getting worse and will eventually destroy your health and relationships. Many alcoholics end up dead, impoverished, or in prison. This is often the end result of all chemical dependency.  Do not be discouraged if you are not ready to take action immediately.  Decisions to change behavior often involve up and down desires to change and feeling like you cannot decide.  Try to think more seriously about your drinking behavior.  Think of the reasons to quit. WHERE TO GO FOR ADDITIONAL INFORMATION   The [place] on Alcohol Abuse and Alcoholism (NIAAA) http://www.bradshaw.com/  [employer] on Alcoholism and Drug Dependence (NCADD) www.ncadd.[place] (ASAM) http://carpenter.net/  Document Released: 05/25/2005 Document Revised: 08/17/2011 Document Reviewed: 01/11/2008 [facility] Patient Information 2014 [place].

## 2013-10-23 NOTE — ED Provider Notes (Addendum)
CSN: [id number]     Arrival date & time 10/23/13  0912 History   First MD Initiated Contact with Patient 10/23/13 0932     Chief Complaint  Patient presents with  . Back Pain  . 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 Pain is nonradiating. He denies fever denies loss of bladder or bowel control. He's been using ear to treat his pain. He is also treated himself with Goody powder, without relief. He last drink beer this morning. Nothing makes symptoms better or worse. No other associated symptoms. Past Medical History  Diagnosis Date  . 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 Back pain   . Pancreatitis    Past Surgical History  Procedure Laterality Date  . Knee surgery    . Neck surgery     History reviewed. No pertinent family history. History  Substance Use Topics  . Smoking status: Current Every Day Smoker    Types: Cigarettes  . Smokeless tobacco: Not on file  . Alcohol Use: Yes     Comment: alcoholic (4 70'Y a day)   crack cocaine. Last used one month ago  Review of Systems  Musculoskeletal: Positive for back pain and neck pain.  Psychiatric/Behavioral:       Alcohol abuse  All other systems reviewed and are negative.     Allergies  Penicillins  Home Medications   Prior to Admission medications   Medication Sig Start Date End Date Taking? Authorizing Provider  Aspirin-Acetaminophen-Caffeine (GOODY HEADACHE PO) Take 2 packets by mouth daily as needed. For pain    Historical Provider, MD  HYDROcodone-acetaminophen (VICODIN) 5-500 MG per tablet Take 1 tablet by mouth 2 (two) times daily.    Historical Provider, MD   BP 169/114  Pulse 93  Temp(Src) 97.9 F (36.6 C) (Oral)  Resp 14  Wt 160 lb (72.576 kg)  SpO2 100% Physical Exam   Nursing note and vitals reviewed. Constitutional: He is oriented to person, place, and time. He appears well-developed and well-nourished. No distress.  Alert Glasgow Coma Score 15 does not appear intoxicated. No distress  HENT:  Head: Normocephalic and atraumatic.  Mouth/Throat: Oropharyngeal exudate present.  Eyes: Conjunctivae are normal. Pupils are equal, round, and reactive to light.  Neck: Neck supple. No tracheal deviation present. No thyromegaly present.  Cardiovascular: Normal rate and regular rhythm.   No murmur heard. Pulmonary/Chest: Effort normal and breath sounds normal.  Abdominal: Soft. Bowel sounds are normal. He exhibits no distension. There is no tenderness.  Musculoskeletal: Normal range of motion. He exhibits no edema and no tenderness.  Entire spine nontender.  Neurological: He is alert and oriented to person, place, and time. He displays normal reflexes. No cranial nerve deficit. Coordination normal.  DTRs symmetric bilaterally knee jerk and biceps toes downward going bilaterally  Skin: Skin is warm and dry. No rash noted.  Psychiatric: He has a normal mood and affect.    ED Course  Procedures (including critical care time) Labs Review Labs Reviewed  ACETAMINOPHEN LEVEL  CBC  COMPREHENSIVE METABOLIC PANEL  ETHANOL  SALICYLATE LEVEL  URINE RAPID DRUG SCREEN (HOSP PERFORMED)    Imaging Review No results found.   EKG Interpretation None     Social work consult to see patient. 11 AM patient alert Glasgow Coma Score 15,. States "  I feel good" he feels ready to go he is alert and ambulatory. Results for orders placed during the hospital encounter of 10/23/13  ACETAMINOPHEN LEVEL      Result Value Ref Range   Acetaminophen (Tylenol), Serum <15.0  10 - 30 ug/mL  CBC      Result Value Ref Range   WBC 6.7  4.0 - 10.5 K/uL   RBC 5.04  4.22 - 5.81 MIL/uL   Hemoglobin 16.5  13.0 - 17.0 g/dL   HCT 48.0  39.0 - 52.0 %   MCV 95.2  78.0 - 100.0 fL   MCH 32.7   26.0 - 34.0 pg   MCHC 34.4  30.0 - 36.0 g/dL   RDW 16.6 (*) 11.5 - 15.5 %   Platelets 259  150 - 400 K/uL  COMPREHENSIVE METABOLIC PANEL      Result Value Ref Range   Sodium 136 (*) 137 - 147 mEq/L   Potassium 4.2  3.7 - 5.3 mEq/L   Chloride 97  96 - 112 mEq/L   CO2 18 (*) 19 - 32 mEq/L   Glucose, Bld 88  70 - 99 mg/dL   BUN 5 (*) 6 - 23 mg/dL   Creatinine, Ser 0.74  0.50 - 1.35 mg/dL   Calcium 9.1  8.4 - 10.5 mg/dL   Total Protein 7.3  6.0 - 8.3 g/dL   Albumin 3.7  3.5 - 5.2 g/dL   AST 32  0 - 37 U/L   ALT 19  0 - 53 U/L   Alkaline Phosphatase 74  39 - 117 U/L   Total Bilirubin 0.4  0.3 - 1.2 mg/dL   GFR calc non Af Amer >90  >90 mL/min   GFR calc Af Amer >90  >90 mL/min  ETHANOL      Result Value Ref Range   Alcohol, Ethyl (B) 197 (*) 0 - 11 mg/dL  SALICYLATE LEVEL      Result Value Ref Range   Salicylate Lvl 7.7  2.8 - 20.0 mg/dL  URINE RAPID DRUG SCREEN (HOSP PERFORMED)      Result Value Ref Range   Opiates NONE DETECTED  NONE DETECTED   Cocaine POSITIVE (*) NONE DETECTED   Benzodiazepines NONE DETECTED  NONE DETECTED   Amphetamines NONE DETECTED  NONE DETECTED   Tetrahydrocannabinol NONE DETECTED  NONE DETECTED   Barbiturates NONE DETECTED  NONE DETECTED   No results found.  MDM  Final diagnoses:  None   note: patient has bicarbonate of 18 on complete metabolic panel. Likely mild metabolic acidosis secondary to alcoholic ketoacidosis. Offered a meal and further observation. He wishes to leave the ED. Says "I feel fine" he does not  To beappear clinically ill.does not appear intoxicated Patient seen by social work. Referred to [facility], and and to [facility] Diagnosis #1 chronic neck and back pain #2 polysubstance abuse  #3 elevated blood pressure   Orlie Dakin, MD 10/23/13 [place], MD 10/23/13 1115

## 2014-07-12 ENCOUNTER — Emergency Department (HOSPITAL_COMMUNITY)
Admission: EM | Admit: 2014-07-12 | Discharge: 2014-07-12 | Disposition: A | Discharge status: Home / Self Care | Payer: Self-pay | Attending: Emergency Medicine | Admitting: Emergency Medicine

## 2014-07-12 ENCOUNTER — Emergency Department (HOSPITAL_COMMUNITY): Payer: Self-pay

## 2014-07-12 ENCOUNTER — Emergency Department (HOSPITAL_COMMUNITY): Payer: Medicaid Other

## 2014-07-12 ENCOUNTER — Encounter (HOSPITAL_COMMUNITY): Payer: Self-pay | Admitting: Emergency Medicine

## 2014-07-12 DIAGNOSIS — R221 Localized swelling, mass and lump, neck: Secondary | ICD-10-CM | POA: Insufficient documentation

## 2014-07-12 DIAGNOSIS — R059 Cough, unspecified: Secondary | ICD-10-CM

## 2014-07-12 DIAGNOSIS — R05 Cough: Secondary | ICD-10-CM | POA: Insufficient documentation

## 2014-07-12 DIAGNOSIS — J029 Acute pharyngitis, unspecified: Secondary | ICD-10-CM | POA: Insufficient documentation

## 2014-07-12 DIAGNOSIS — Z8619 Personal history of other infectious and parasitic diseases: Secondary | ICD-10-CM | POA: Insufficient documentation

## 2014-07-12 DIAGNOSIS — Z880 Allergy status to penicillin: Secondary | ICD-10-CM | POA: Insufficient documentation

## 2014-07-12 DIAGNOSIS — Z8719 Personal history of other diseases of the digestive system: Secondary | ICD-10-CM | POA: Insufficient documentation

## 2014-07-12 DIAGNOSIS — Z720 Tobacco use: Secondary | ICD-10-CM | POA: Insufficient documentation

## 2014-07-12 DIAGNOSIS — R0602 Shortness of breath: Secondary | ICD-10-CM | POA: Insufficient documentation

## 2014-07-12 LAB — CBC WITH DIFFERENTIAL/PLATELET
Basophils Absolute: 0.1 10*3/uL (ref 0.0–0.1)
Basophils Relative: 1 % (ref 0–1)
EOS PCT: 1 % (ref 0–5)
Eosinophils Absolute: 0 10*3/uL (ref 0.0–0.7)
HCT: 47.3 % (ref 39.0–52.0)
Hemoglobin: 16.7 g/dL (ref 13.0–17.0)
LYMPHS ABS: 1.1 10*3/uL (ref 0.7–4.0)
Lymphocytes Relative: 19 % (ref 12–46)
MCH: 33.9 pg (ref 26.0–34.0)
MCHC: 35.3 g/dL (ref 30.0–36.0)
MCV: 96.1 fL (ref 78.0–100.0)
MONOS PCT: 7 % (ref 3–12)
Monocytes Absolute: 0.4 10*3/uL (ref 0.1–1.0)
NEUTROS PCT: 72 % (ref 43–77)
Neutro Abs: 4 10*3/uL (ref 1.7–7.7)
PLATELETS: 220 10*3/uL (ref 150–400)
RBC: 4.92 MIL/uL (ref 4.22–5.81)
RDW: 13.8 % (ref 11.5–15.5)
WBC: 5.6 10*3/uL (ref 4.0–10.5)

## 2014-07-12 LAB — BASIC METABOLIC PANEL
Anion gap: 11 (ref 5–15)
BUN: 5 mg/dL — ABNORMAL LOW (ref 6–23)
CO2: 22 mmol/L (ref 19–32)
Calcium: 8.9 mg/dL (ref 8.4–10.5)
Chloride: 104 mmol/L (ref 96–112)
Creatinine, Ser: 0.73 mg/dL (ref 0.50–1.35)
GFR calc non Af Amer: >90 mL/min (ref >90)
Glucose, Bld: 88 mg/dL (ref 70–99)
Potassium: 4.3 mmol/L (ref 3.5–5.1)
Sodium: 137 mmol/L (ref 135–145)

## 2014-07-12 LAB — I-STAT CHEM 8, ED
BUN: 4 mg/dL — AB (ref 6–23)
CALCIUM ION: 1.05 mmol/L — AB (ref 1.12–1.23)
Chloride: 102 mmol/L (ref 96–112)
Creatinine, Ser: 0.8 mg/dL (ref 0.50–1.35)
GLUCOSE: 91 mg/dL (ref 70–99)
HCT: 53 % — ABNORMAL HIGH (ref 39.0–52.0)
HEMOGLOBIN: 18 g/dL — AB (ref 13.0–17.0)
Potassium: 4.3 mmol/L (ref 3.5–5.1)
Sodium: 138 mmol/L (ref 135–145)
TCO2: 20 mmol/L (ref 0–100)

## 2014-07-12 LAB — TSH: TSH: 1.29 u[IU]/mL (ref 0.350–4.500)

## 2014-07-12 MED ORDER — SODIUM CHLORIDE 0.9 % IV BOLUS (SEPSIS)
1000.0000 mL | Freq: Once | INTRAVENOUS | Status: AC
  Administered 2014-07-12: 1000 mL via INTRAVENOUS

## 2014-07-12 MED ORDER — LORAZEPAM 2 MG/ML IJ SOLN: 0.0000 mg | Freq: Four times a day (QID) | INTRAMUSCULAR | Status: DC

## 2014-07-12 MED ORDER — AMOXICILLIN 500 MG PO CAPS: 500.0000 mg | ORAL_CAPSULE | Freq: Three times a day (TID) | ORAL | Status: DC

## 2014-07-12 MED ORDER — DEXAMETHASONE SODIUM PHOSPHATE 10 MG/ML IJ SOLN
10.0000 mg | Freq: Once | INTRAMUSCULAR | Status: AC
  Administered 2014-07-12: 10 mg via INTRAMUSCULAR
  Filled 2014-07-12: qty 1

## 2014-07-12 MED ORDER — VITAMIN B-1 100 MG PO TABS
100.0000 mg | ORAL_TABLET | Freq: Every day | ORAL | Status: DC
  Administered 2014-07-12: 100 mg via ORAL
  Filled 2014-07-12: qty 1

## 2014-07-12 MED ORDER — LORAZEPAM 1 MG PO TABS
0.0000 mg | ORAL_TABLET | Freq: Four times a day (QID) | ORAL | Status: DC
  Administered 2014-07-12: 1 mg via ORAL

## 2014-07-12 MED ORDER — LORAZEPAM 2 MG/ML IJ SOLN: 0.0000 mg | Freq: Two times a day (BID) | INTRAMUSCULAR | Status: DC

## 2014-07-12 MED ORDER — IOHEXOL 350 MG/ML SOLN
100.0000 mL | Freq: Once | INTRAVENOUS | Status: AC | PRN
  Administered 2014-07-12: 100 mL via INTRAVENOUS

## 2014-07-12 MED ORDER — AMOXICILLIN 500 MG PO CAPS
1000.0000 mg | ORAL_CAPSULE | Freq: Once | ORAL | Status: AC
  Administered 2014-07-12: 1000 mg via ORAL
  Filled 2014-07-12: qty 2

## 2014-07-12 MED ORDER — THIAMINE HCL 100 MG/ML IJ SOLN: 100.0000 mg | Freq: Every day | INTRAMUSCULAR | Status: DC

## 2014-07-12 MED ORDER — LORAZEPAM 1 MG PO TABS
0.0000 mg | ORAL_TABLET | Freq: Two times a day (BID) | ORAL | Status: DC
  Filled 2014-07-12: qty 1

## 2014-07-12 NOTE — ED Notes (Signed)
Patient continues to go outside to smoke.  We are attempting to recheck vital signs

## 2014-07-12 NOTE — ED Notes (Signed)
Patient transported to CT 

## 2014-07-12 NOTE — ED Notes (Signed)
PA-C Harris at bedside.

## 2014-07-12 NOTE — ED Provider Notes (Signed)
CSN: [id number]     Arrival date & time 07/12/14  1147 History   First MD Initiated Contact with Patient 07/12/14 1215     Chief Complaint  Patient presents with  . Sore Throat  . Neck Pain     (Consider location/radiation/quality/duration/timing/severity/associated sxs/prior Treatment) HPI   [name] Is a 54 year old male who presents emergency Department with chief complaint of throat swelling and difficulty swallowing. Patient states that he first noticed this about 2 months ago and was hoping it would go away. However, he has had increasing swelling of the superior region of his throat. He has a constant sensation of coughing. He states that he is having to chew his food into smaller bites swallow it, but he is able to tolerate all kinds of food textures and liquids. The patient admits to heavy alcohol abuse and states he drinks about 3,40 ounce malt liquors daily and has a long-standing history of tobacco abuse, smoking 2 packs daily. The patient admits to soaking night sweats which have been going on for several months. He denies unexplained weight loss, early satiety, fevers. Last alcohol intake was this morning. Patient is tachycardic on arrival. Past Medical History  Diagnosis Date  . 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 Back pain   . Pancreatitis    Past Surgical History  Procedure Laterality Date  . Knee surgery    . Neck surgery     History reviewed. No pertinent family history. History  Substance Use Topics  . Smoking status: Current Every Day Smoker    Types: Cigarettes  . Smokeless tobacco: Not on file  . Alcohol Use: Yes     Comment: alcoholic (4 13'K a day)    Review of Systems  Ten systems reviewed and are negative for acute change, except as noted in the HPI.    Allergies  Penicillins  Home Medications   Prior to Admission medications   Medication Sig Start Date End Date Taking? Authorizing Provider  Aspirin-Acetaminophen-Caffeine (GOODY HEADACHE PO) Take  2 packets by mouth daily as needed. For pain   Yes Historical Provider, MD  amoxicillin (AMOXIL) 500 MG capsule Take 1 capsule (500 mg total) by mouth 3 (three) times daily. 07/12/14   Gemayel Mascio, PA-C   BP 139/73 mmHg  Pulse 88  Temp(Src) 97.9 F (36.6 C) (Oral)  Resp 20  SpO2 94% Physical Exam  Constitutional: He appears well-developed and well-nourished. No distress.  Red complexion, patient has constant cough, visible swelling in the anterior throat. Upon arrival in the room. Patient appears older than stated age.  HENT:  Head: Normocephalic and atraumatic.  Eyes: Conjunctivae are normal. No scleral icterus.  Neck: Normal range of motion. Neck supple. No thyromegaly present.    Cardiovascular: Normal rate, regular rhythm and normal heart sounds.   Pulmonary/Chest: Effort normal and breath sounds normal. No respiratory distress.  Abdominal: Soft. There is no tenderness.  Musculoskeletal: He exhibits no edema.  Lymphadenopathy:       Head (right side): No tonsillar adenopathy present.       Head (left side): No tonsillar adenopathy present.    He has no cervical adenopathy.    He has no axillary adenopathy.       Right: No supraclavicular and no epitrochlear adenopathy present.       Left: No supraclavicular and no epitrochlear adenopathy present.  Neurological: He is alert.  Skin: Skin is warm and dry. He is not diaphoretic.  Psychiatric: His behavior is normal.  Nursing note and vitals reviewed.   ED Course  Procedures (including critical care time) Labs Review Labs Reviewed  BASIC METABOLIC PANEL - Abnormal; Notable for the following:    BUN 5 (*)    All other components within normal limits  I-STAT CHEM 8, ED - Abnormal; Notable for the following:    BUN 4 (*)    Calcium, Ion 1.05 (*)    Hemoglobin 18.0 (*)    HCT 53.0 (*)    All other components within normal limits  CBC WITH DIFFERENTIAL/PLATELET  TSH    Imaging Review Dg Chest 2 View  07/12/2014    CLINICAL DATA:  Chest pain, shortness of breath, congestion for 2 months  EXAM: CHEST  2 VIEW  COMPARISON:  03/08/2012  FINDINGS: Cardiomediastinal silhouette is stable. Old right rib deformities are stable. No acute infiltrate or pleural effusion. No pulmonary edema. Mild degenerative changes lower thoracic spine.  IMPRESSION: No active cardiopulmonary disease.   Electronically Signed   By: Lahoma Crocker M.D.   On: 07/12/2014 17:07   Ct Soft Tissue Neck W Contrast  07/12/2014   CLINICAL DATA:  Sore throat, neck pain.  Losing voids for 2 months.  EXAM: CT NECK WITH CONTRAST  TECHNIQUE: Multidetector CT imaging of the neck was performed using the standard protocol following the bolus administration of intravenous contrast.  CONTRAST:  181mL OMNIPAQUE IOHEXOL 350 MG/ML SOLN  COMPARISON:  None.  FINDINGS: Pharynx and larynx: Peritonsillar tissue are enlarged without fluid collection. Larynx appears normal.  Salivary glands: Normal.  Thyroid: Normal.  Lymph nodes: No significant adenopathy is noted.  Vascular: Mild atherosclerotic calcifications are seen involving proximal internal carotid artery without significant stenosis.  Limited intracranial: No abnormality seen.  Visualized orbits: Grossly normal.  Mastoids and visualized paranasal sinuses: Mild mucosal thickening seen inferiorly in left maxillary sinus.  Skeleton: Mild degenerative disc disease is noted at C4-5, C5-6 and C6-7.  Upper chest: No definite abnormality seen.  IMPRESSION: Peritonsillar tissue enlargement is seen without fluid collection or abscess seen.   Electronically Signed   By: Sabino Dick M.D.   On: 07/12/2014 19:29   Ct Angio Chest Pe W/cm &/or Wo Cm  07/12/2014   CLINICAL DATA:  Has been losing voice for 2 months, with shortness of breath for 2 months. Prior difficulty swallowing. Initial encounter.  EXAM: CT ANGIOGRAPHY CHEST WITH CONTRAST  TECHNIQUE: Multidetector CT imaging of the chest was performed using the standard protocol during  bolus administration of intravenous contrast. Multiplanar CT image reconstructions and MIPs were obtained to evaluate the vascular anatomy.  CONTRAST:  175mL OMNIPAQUE IOHEXOL 350 MG/ML SOLN  COMPARISON:  Chest radiograph performed earlier today at 3:54 p.m.  FINDINGS: There is no evidence of pulmonary embolus.  A small bleb is noted within the anterior right upper lobe. Two small nodular densities at the left lung base are thought to reflect atelectasis, each measuring 5 mm in size. There is no evidence of significant focal consolidation, pleural effusion or pneumothorax. No masses are identified; no abnormal focal contrast enhancement is seen.  Scattered coronary artery calcifications are seen. The mediastinum is otherwise unremarkable. No mediastinal lymphadenopathy is seen. No pericardial effusion is identified. Scattered calcification is noted along the proximal great vessels. No axillary lymphadenopathy is seen. The visualized portions of the thyroid gland are unremarkable in appearance.  The visualized portions of the liver and spleen are unremarkable.  No acute osseous abnormalities are seen.  Review of the MIP images confirms the above  findings.  IMPRESSION: 1. No evidence of pulmonary embolus. 2. Small bleb within the anterior right upper lobe. Two small nodular densities at the left lung base are thought to reflect atelectasis. 3. Scattered coronary artery calcifications seen.   Electronically Signed   By: Garald Balding M.D.   On: 07/12/2014 19:04     EKG Interpretation None      MDM   Final diagnoses:  Cough  Shortness of breath    Filed Vitals:   07/12/14 1813 07/12/14 1900 07/12/14 1930 07/12/14 2010  BP: 163/103 160/92 139/73   Pulse: 109 101 88   Temp:    97.9 F (36.6 C)  TempSrc:    Oral  Resp:      SpO2:  95% 94%    Patient placed on CIWA and given fluids. He has polycythemia consistent with his heavy smoking history. TSH is within normal limits.his labs are otherwise  unremarkable.Chest x-ray without acute abnormality.his CT images shows no pulmonary embolus as the cause of his tachycardia. CT of the throat shows swelling and tissue enlargement in the peritonsillar region without overt fluid collection or abscess.I spoken with Dr. Lucia Gaskins, who is on call for ear, nose and throat today. The patient will be discharged today to follow-up in the clinic of Dr. Lucia Gaskins tomorrow at 4 PM.  Expressed the importance of following up with him. He will also be started on amoxicillin andgiven a dose of Decadron as suggested by Dr. Lucia Gaskins. Patient expresses his understanding and the importance of follow-up. He agrees with the plan of care. He appears stable for discharge at this time.    Margarita Mail, PA-C 07/13/14 1356  Wandra Arthurs, MD 07/15/14 2101

## 2014-07-12 NOTE — ED Notes (Signed)
Pt sts sore throat with trouble swallowing and talking x 2 months and neck pain that is chronic in nature

## 2014-07-12 NOTE — Discharge Instructions (Signed)
Bone Biopsy, Needle  A bone biopsy is a minor surgical procedure in which a small sample of bone is removed. The sample is taken with a needle. The bone sample is then looked at under a microscope by a specialist. The sample is usually taken from a bone close to the skin. This test is often done to identify a problem found on X-rays or with blood tests. It may be used to make sure something seen in the bone is not cancer or another problem that needs treatment. It can help diagnose problems such as:   A tumor of the bone marrow (multiple myeloma).   Bone that forms abnormally (Paget's disease).   Non-cancerous (benign) bone cysts.   Bony growths.   Infections in the bone.   Other bone marrow conditions.  LET YOUR CAREGIVER KNOW ABOUT:   Previous problems with anesthetics or medicines used to numb the skin.   Allergies to dyes, iodine, foods, or latex.   Medicines taken including herbs, eye drops, prescription medicines (especially medicines used to "thin the blood"), aspirin, other over-the-counter medicines, and steroids (by mouth or as a cream).   History of bleeding or blood problems.   Possibility of pregnancy, if this applies.   History of blood clots in your legs or lungs.   Previous surgery.   Other important health problems.  RISKS AND COMPLICATIONS  All surgery is associated with risks. Some of these risks are:   Excessive bleeding.   Infection.   Injury to surrounding tissue.  BEFORE THE PROCEDURE   Ask your caregiver how long to withhold aspirin or blood thinners prior to having the procedure.   Do not eat or drink anything after midnight the night before the biopsy.   Let your caregiver know if you develop a cold or other infectious problem prior to surgery.   You should arrive 60 minutes prior to your procedure or as directed.  PROCEDURE    The sample of bone is removed by putting a large needle through the skin and into the bone.   Bone biopsies are frequently done under local  anesthesia. Sometimes sedatives are also given to keep you relaxed or even asleep during the procedure.  Finding out the results of your test  Not all test results are available during your visit. If your test results are not back during the visit, make an appointment with your caregiver to find out the results. Do not assume everything is normal if you have not heard from your caregiver or the medical facility. It is important for you to follow up on all of your test results.   SEEK MEDICAL CARE IF:    You have redness, swelling, or increasing pain in the biopsy site.   You have pus coming from the biopsy site.   You have drainage from the site lasting longer than 1 to 2 days.   You notice a bad smell coming from the biopsy site or dressing.   You have a breaking open of the site (edges not staying together) after sutures have been removed.   You develop persistent nausea or vomiting.  SEEK IMMEDIATE MEDICAL CARE IF:    You have a fever.   You develop a rash.   You have difficulty breathing.   You develop any reaction or side effects to medicines given.  Document Released: 04/02/2004 Document Revised: 08/17/2011 Document Reviewed: 10/31/2008  [employer] Patient Information 2015 [employer]. This information is not intended to replace advice given to you 

## 2014-07-25 ENCOUNTER — Encounter (HOSPITAL_BASED_OUTPATIENT_CLINIC_OR_DEPARTMENT_OTHER): Payer: Self-pay | Admitting: *Deleted

## 2014-07-25 NOTE — Progress Notes (Signed)
Cannot swallow well-drinking beer and smoking 2 ppd-coughing-has been on antibiotics

## 2014-07-26 ENCOUNTER — Other Ambulatory Visit: Payer: Self-pay | Admitting: Otolaryngology

## 2014-07-26 NOTE — H&P (Signed)
PREOPERATIVE H&P  Chief Complaint: sore throat and hoarseness  HPI: [name] is a 54 y.o. male who presents for evaluation of sore throat and hoarseness for several weeks referred by the ER. He has a significant history of smoking and alcohol use. 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 He's taken to the OR for DL and Bx.  Past Medical History  Diagnosis Date  . 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 Back pain   . Pancreatitis   . Loose, teeth   . Alcoholic   . Heavy smoker    Past Surgical History  Procedure Laterality Date  . Knee surgery  2013    rt  . Neck surgery  2013    cervical fusion-  . Artery repair  2013    neck   History   Social History  . Marital Status: Single    Spouse Name: N/A  . Number of Children: N/A  . Years of Education: N/A   Social History Main Topics  . Smoking status: Current Every Day Smoker -- 2.00 packs/day    Types: Cigarettes  . Smokeless tobacco: Not on file  . Alcohol Use: Yes     Comment: alcoholic (4 32'D a day)  . Drug Use: No  . Sexual Activity: Not on file   Other Topics Concern  . None   Social History Narrative   History reviewed. No pertinent family history. Allergies  Allergen Reactions  . Penicillins Other (See Comments)    Reaction as a child-unknown   Prior to Admission medications   Medication Sig Start Date End Date Taking? Authorizing Provider  Aspirin-Acetaminophen-Caffeine (GOODY HEADACHE PO) Take 2 packets by mouth daily as needed. For pain    Historical Provider, MD     Positive ROS: sore throat and hoarseness for 2 months  All other systems have been reviewed and were otherwise negative with the exception of those mentioned in the HPI and as above.  Physical Exam: There were no vitals filed for this visit.  General: Alert, no acute distress Oral: Normal oral mucosa and tonsils Nasal: Clear nasal passages FOL revealed clear nasopharynx and BOT  inflammatory changes of both VCs, OK mobility Neck: No palpable adenopathy or thyroid nodules Ear: Ear canal is clear with normal appearing TMs Cardiovascular: Regular rate and rhythm, no murmur.  Respiratory: Clear to auscultation Neurologic: Alert and oriented x 3   Assessment/Plan: sore throat Plan for Procedure(s): MICROLARYNGOSCOPY AND BIOPSY   Melony Overly, MD 07/26/2014 12:58 PM 2

## 2014-07-27 ENCOUNTER — Ambulatory Visit (HOSPITAL_BASED_OUTPATIENT_CLINIC_OR_DEPARTMENT_OTHER)
Admission: RE | Admit: 2014-07-27 | Discharge: 2014-07-27 | Disposition: A | Discharge status: Home / Self Care | Payer: Self-pay | Source: Ambulatory Visit | Attending: Otolaryngology | Admitting: Otolaryngology

## 2014-07-27 ENCOUNTER — Encounter (HOSPITAL_BASED_OUTPATIENT_CLINIC_OR_DEPARTMENT_OTHER): Payer: Self-pay | Admitting: Anesthesiology

## 2014-07-27 ENCOUNTER — Encounter (HOSPITAL_BASED_OUTPATIENT_CLINIC_OR_DEPARTMENT_OTHER)
Admission: RE | Disposition: A | Discharge status: Home / Self Care | Payer: Self-pay | Source: Ambulatory Visit | Attending: Otolaryngology

## 2014-07-27 ENCOUNTER — Encounter (HOSPITAL_BASED_OUTPATIENT_CLINIC_OR_DEPARTMENT_OTHER): Payer: Self-pay | Admitting: *Deleted

## 2014-07-27 DIAGNOSIS — Z981 Arthrodesis status: Secondary | ICD-10-CM | POA: Insufficient documentation

## 2014-07-27 DIAGNOSIS — F1010 Alcohol abuse, uncomplicated: Secondary | ICD-10-CM | POA: Insufficient documentation

## 2014-07-27 DIAGNOSIS — Z880 Allergy status to penicillin: Secondary | ICD-10-CM | POA: Insufficient documentation

## 2014-07-27 DIAGNOSIS — B182 Chronic viral hepatitis C: Secondary | ICD-10-CM | POA: Insufficient documentation

## 2014-07-27 DIAGNOSIS — R490 Dysphonia: Secondary | ICD-10-CM | POA: Insufficient documentation

## 2014-07-27 DIAGNOSIS — Z7982 Long term (current) use of aspirin: Secondary | ICD-10-CM | POA: Insufficient documentation

## 2014-07-27 DIAGNOSIS — F17210 Nicotine dependence, cigarettes, uncomplicated: Secondary | ICD-10-CM | POA: Insufficient documentation

## 2014-07-27 DIAGNOSIS — J029 Acute pharyngitis, unspecified: Secondary | ICD-10-CM | POA: Insufficient documentation

## 2014-07-27 HISTORY — DX: Alcohol dependence, uncomplicated: F10.20

## 2014-07-27 HISTORY — DX: Other specified disorders of teeth and supporting structures: K08.89

## 2014-07-27 HISTORY — DX: Nicotine dependence, unspecified, uncomplicated: F17.200

## 2014-07-27 SURGERY — MICROLARYNGOSCOPY
Anesthesia: General

## 2014-07-27 MED ORDER — SUCCINYLCHOLINE CHLORIDE 20 MG/ML IJ SOLN
INTRAMUSCULAR | Status: AC
  Filled 2014-07-27: qty 1

## 2014-07-27 MED ORDER — MIDAZOLAM HCL 2 MG/2ML IJ SOLN: 1.0000 mg | INTRAMUSCULAR | Status: DC | PRN

## 2014-07-27 MED ORDER — FENTANYL CITRATE 0.05 MG/ML IJ SOLN
INTRAMUSCULAR | Status: AC
  Filled 2014-07-27: qty 6

## 2014-07-27 MED ORDER — PROPOFOL 10 MG/ML IV BOLUS
INTRAVENOUS | Status: AC
  Filled 2014-07-27: qty 20

## 2014-07-27 MED ORDER — MIDAZOLAM HCL 2 MG/2ML IJ SOLN
INTRAMUSCULAR | Status: AC
  Filled 2014-07-27: qty 2

## 2014-07-27 MED ORDER — LACTATED RINGERS IV SOLN
INTRAVENOUS | Status: DC
  Administered 2014-07-31 (×2): via INTRAVENOUS

## 2014-07-27 MED ORDER — FENTANYL CITRATE 0.05 MG/ML IJ SOLN: 50.0000 ug | INTRAMUSCULAR | Status: DC | PRN

## 2014-07-27 SURGICAL SUPPLY — 25 items
CANISTER SUCT 1200ML W/VALVE (MISCELLANEOUS) ×3 IMPLANT
CONT SPEC 4OZ CLIKSEAL STRL BL (MISCELLANEOUS) IMPLANT
GLOVE SS BIOGEL STRL SZ 7.5 (GLOVE) ×1 IMPLANT
GLOVE SUPERSENSE BIOGEL SZ 7.5 (GLOVE) ×2
GOWN STRL REUS W/ TWL LRG LVL3 (GOWN DISPOSABLE) IMPLANT
GOWN STRL REUS W/ TWL XL LVL3 (GOWN DISPOSABLE) IMPLANT
GOWN STRL REUS W/TWL LRG LVL3 (GOWN DISPOSABLE)
GOWN STRL REUS W/TWL XL LVL3 (GOWN DISPOSABLE)
GUARD TEETH (MISCELLANEOUS) IMPLANT
MARKER SKIN DUAL TIP RULER LAB (MISCELLANEOUS) IMPLANT
NDL SAFETY ECLIPSE 18X1.5 (NEEDLE) ×1 IMPLANT
NEEDLE HYPO 18GX1.5 SHARP (NEEDLE) ×2
NEEDLE SPNL 22GX7 QUINCKE BK (NEEDLE) IMPLANT
NS IRRIG 1000ML POUR BTL (IV SOLUTION) ×3 IMPLANT
PAD ALCOHOL SWAB (MISCELLANEOUS) ×3 IMPLANT
PATTIES SURGICAL .5 X3 (DISPOSABLE) ×3 IMPLANT
SHEET MEDIUM DRAPE 40X70 STRL (DRAPES) ×3 IMPLANT
SLEEVE SCD COMPRESS KNEE MED (MISCELLANEOUS) ×3 IMPLANT
SOLUTION ANTI FOG 6CC (MISCELLANEOUS) IMPLANT
SOLUTION BUTLER CLEAR DIP (MISCELLANEOUS) ×3 IMPLANT
SPONGE GAUZE 4X4 12PLY STER LF (GAUZE/BANDAGES/DRESSINGS) ×6 IMPLANT
SYR CONTROL 10ML LL (SYRINGE) IMPLANT
TOWEL OR 17X24 6PK STRL BLUE (TOWEL DISPOSABLE) ×3 IMPLANT
TUBE CONNECTING 20'X1/4 (TUBING) ×1
TUBE CONNECTING 20X1/4 (TUBING) ×2 IMPLANT

## 2014-07-27 NOTE — Anesthesia Preprocedure Evaluation (Addendum)
Anesthesia Evaluation  Patient identified by MRN, date of birth, ID band Patient awake    Reviewed: Allergy & Precautions, NPO status , Patient's Chart, lab work & pertinent test results  Airway Mallampati: II  TM Distance: >3 FB Neck ROM: full    Dental   Pulmonary Current Smoker,          Cardiovascular negative cardio ROS      Neuro/Psych    GI/Hepatic (+)     substance abuse  alcohol use, Hepatitis -, C  Endo/Other    Renal/GU      Musculoskeletal   Abdominal   Peds  Hematology   Anesthesia Other Findings   Reproductive/Obstetrics                            Anesthesia Physical Anesthesia Plan  ASA: II  Anesthesia Plan: General   Post-op Pain Management:    Induction: Intravenous  Airway Management Planned: Oral ETT  Additional Equipment:   Intra-op Plan:   Post-operative Plan: Extubation in OR  Informed Consent: I have reviewed the patients History and Physical, chart, labs and discussed the procedure including the risks, benefits and alternatives for the proposed anesthesia with the patient or authorized representative who has indicated his/her understanding and acceptance.     Plan Discussed with: CRNA and Surgeon  Anesthesia Plan Comments:        Anesthesia Quick Evaluation

## 2014-07-27 NOTE — Progress Notes (Signed)
Case cancelled by anesthesia, pt heavy drinker, tremulous, tachycardic. Dr Lucia Gaskins also here to speak with pt, plan to reschedule.

## 2014-07-27 NOTE — Progress Notes (Signed)
R/s due to shaky-sick-be sure to eat and drink well days before surgery- Still has to be npo p mn-arrive 6am

## 2014-07-31 ENCOUNTER — Ambulatory Visit (HOSPITAL_BASED_OUTPATIENT_CLINIC_OR_DEPARTMENT_OTHER)
Admission: RE | Admit: 2014-07-31 | Discharge: 2014-07-31 | Disposition: A | Discharge status: Home / Self Care | Payer: Medicaid Other | Source: Ambulatory Visit | Attending: Otolaryngology | Admitting: Otolaryngology

## 2014-07-31 ENCOUNTER — Ambulatory Visit (HOSPITAL_BASED_OUTPATIENT_CLINIC_OR_DEPARTMENT_OTHER): Payer: Medicaid Other | Admitting: Anesthesiology

## 2014-07-31 ENCOUNTER — Encounter (HOSPITAL_BASED_OUTPATIENT_CLINIC_OR_DEPARTMENT_OTHER)
Admission: RE | Disposition: A | Discharge status: Home / Self Care | Payer: Self-pay | Source: Ambulatory Visit | Attending: Otolaryngology

## 2014-07-31 ENCOUNTER — Ambulatory Visit (HOSPITAL_BASED_OUTPATIENT_CLINIC_OR_DEPARTMENT_OTHER): Payer: Self-pay | Admitting: Anesthesiology

## 2014-07-31 ENCOUNTER — Encounter (HOSPITAL_BASED_OUTPATIENT_CLINIC_OR_DEPARTMENT_OTHER): Payer: Self-pay | Admitting: *Deleted

## 2014-07-31 DIAGNOSIS — J387 Other diseases of larynx: Secondary | ICD-10-CM | POA: Insufficient documentation

## 2014-07-31 DIAGNOSIS — R490 Dysphonia: Secondary | ICD-10-CM | POA: Insufficient documentation

## 2014-07-31 DIAGNOSIS — J029 Acute pharyngitis, unspecified: Secondary | ICD-10-CM | POA: Insufficient documentation

## 2014-07-31 DIAGNOSIS — F1020 Alcohol dependence, uncomplicated: Secondary | ICD-10-CM | POA: Insufficient documentation

## 2014-07-31 DIAGNOSIS — B182 Chronic viral hepatitis C: Secondary | ICD-10-CM | POA: Insufficient documentation

## 2014-07-31 DIAGNOSIS — F17210 Nicotine dependence, cigarettes, uncomplicated: Secondary | ICD-10-CM | POA: Insufficient documentation

## 2014-07-31 HISTORY — PX: MICROLARYNGOSCOPY: SHX5208

## 2014-07-31 SURGERY — MICROLARYNGOSCOPY
Anesthesia: General

## 2014-07-31 MED ORDER — HYDROCODONE-ACETAMINOPHEN 5-325 MG PO TABS: 1.0000 | ORAL_TABLET | Freq: Four times a day (QID) | ORAL | Status: DC | PRN

## 2014-07-31 MED ORDER — EPINEPHRINE HCL 1 MG/ML IJ SOLN
INTRAMUSCULAR | Status: AC
  Filled 2014-07-31: qty 1

## 2014-07-31 MED ORDER — HYDROMORPHONE HCL 1 MG/ML IJ SOLN
INTRAMUSCULAR | Status: AC
  Filled 2014-07-31: qty 1

## 2014-07-31 MED ORDER — MIDAZOLAM HCL 5 MG/5ML IJ SOLN
INTRAMUSCULAR | Status: DC | PRN
  Administered 2014-07-31: 2 mg via INTRAVENOUS

## 2014-07-31 MED ORDER — DEXAMETHASONE SODIUM PHOSPHATE 4 MG/ML IJ SOLN
INTRAMUSCULAR | Status: DC | PRN
  Administered 2014-07-31: 10 mg via INTRAVENOUS

## 2014-07-31 MED ORDER — FENTANYL CITRATE 0.05 MG/ML IJ SOLN: 50.0000 ug | INTRAMUSCULAR | Status: DC | PRN

## 2014-07-31 MED ORDER — PROPOFOL 10 MG/ML IV EMUL
INTRAVENOUS | Status: AC
  Filled 2014-07-31: qty 50

## 2014-07-31 MED ORDER — EPINEPHRINE HCL 1 MG/ML IJ SOLN
INTRAMUSCULAR | Status: DC | PRN
  Administered 2014-07-31: 1 mg via ENDOTRACHEOPULMONARY

## 2014-07-31 MED ORDER — PROPOFOL 10 MG/ML IV BOLUS
INTRAVENOUS | Status: AC
  Filled 2014-07-31: qty 80

## 2014-07-31 MED ORDER — HYDROMORPHONE HCL 1 MG/ML IJ SOLN
0.2500 mg | INTRAMUSCULAR | Status: DC | PRN
  Administered 2014-07-31 (×3): 0.5 mg via INTRAVENOUS

## 2014-07-31 MED ORDER — OXYCODONE HCL 5 MG/5ML PO SOLN: 5.0000 mg | Freq: Once | ORAL | Status: AC | PRN

## 2014-07-31 MED ORDER — FENTANYL CITRATE 0.05 MG/ML IJ SOLN
INTRAMUSCULAR | Status: DC | PRN
Start: 2014-07-31 — End: 2014-07-31
  Administered 2014-07-31 (×2): 50 ug via INTRAVENOUS

## 2014-07-31 MED ORDER — LIDOCAINE HCL (CARDIAC) 20 MG/ML IV SOLN
INTRAVENOUS | Status: DC | PRN
  Administered 2014-07-31: 100 mg via INTRAVENOUS

## 2014-07-31 MED ORDER — PROPOFOL 10 MG/ML IV BOLUS
INTRAVENOUS | Status: DC | PRN
  Administered 2014-07-31: 50 mg via INTRAVENOUS
  Administered 2014-07-31: 100 mg via INTRAVENOUS
  Administered 2014-07-31: 150 mg via INTRAVENOUS

## 2014-07-31 MED ORDER — SUCCINYLCHOLINE CHLORIDE 20 MG/ML IJ SOLN
INTRAMUSCULAR | Status: DC | PRN
  Administered 2014-07-31: 50 mg via INTRAVENOUS

## 2014-07-31 MED ORDER — MEPERIDINE HCL 25 MG/ML IJ SOLN: 6.2500 mg | INTRAMUSCULAR | Status: DC | PRN

## 2014-07-31 MED ORDER — OXYCODONE HCL 5 MG PO TABS
5.0000 mg | ORAL_TABLET | Freq: Once | ORAL | Status: AC | PRN
  Administered 2014-07-31: 5 mg via ORAL

## 2014-07-31 MED ORDER — FENTANYL CITRATE 0.05 MG/ML IJ SOLN: 25.0000 ug | INTRAMUSCULAR | Status: DC | PRN

## 2014-07-31 MED ORDER — OXYCODONE HCL 5 MG PO TABS
ORAL_TABLET | ORAL | Status: AC
  Filled 2014-07-31: qty 1

## 2014-07-31 MED ORDER — ONDANSETRON HCL 4 MG/2ML IJ SOLN
INTRAMUSCULAR | Status: DC | PRN
  Administered 2014-07-31: 4 mg via INTRAVENOUS

## 2014-07-31 MED ORDER — FENTANYL CITRATE 0.05 MG/ML IJ SOLN
INTRAMUSCULAR | Status: AC
  Filled 2014-07-31: qty 6

## 2014-07-31 MED ORDER — CEFAZOLIN SODIUM-DEXTROSE 2-3 GM-% IV SOLR
INTRAVENOUS | Status: DC | PRN
  Administered 2014-07-31: 2 g via INTRAVENOUS

## 2014-07-31 MED ORDER — MIDAZOLAM HCL 2 MG/2ML IJ SOLN
INTRAMUSCULAR | Status: AC
  Filled 2014-07-31: qty 2

## 2014-07-31 MED ORDER — ONDANSETRON HCL 4 MG/2ML IJ SOLN
4.0000 mg | Freq: Once | INTRAMUSCULAR | Status: DC | PRN
Start: 2014-07-31 — End: 2014-07-31

## 2014-07-31 MED ORDER — ONDANSETRON HCL 4 MG/2ML IJ SOLN: 4.0000 mg | Freq: Four times a day (QID) | INTRAMUSCULAR | Status: DC | PRN

## 2014-07-31 MED ORDER — MIDAZOLAM HCL 2 MG/2ML IJ SOLN: 1.0000 mg | INTRAMUSCULAR | Status: DC | PRN

## 2014-07-31 MED ORDER — LACTATED RINGERS IV SOLN
INTRAVENOUS | Status: DC
  Administered 2014-07-31: 07:00:00 via INTRAVENOUS

## 2014-07-31 SURGICAL SUPPLY — 26 items
CANISTER SUCT 1200ML W/VALVE (MISCELLANEOUS) ×3 IMPLANT
CONT SPEC 4OZ CLIKSEAL STRL BL (MISCELLANEOUS) IMPLANT
GLOVE ECLIPSE 6.5 STRL STRAW (GLOVE) ×3 IMPLANT
GLOVE SS BIOGEL STRL SZ 7.5 (GLOVE) ×1 IMPLANT
GLOVE SUPERSENSE BIOGEL SZ 7.5 (GLOVE) ×2
GOWN STRL REUS W/ TWL LRG LVL3 (GOWN DISPOSABLE) ×1 IMPLANT
GOWN STRL REUS W/ TWL XL LVL3 (GOWN DISPOSABLE) IMPLANT
GOWN STRL REUS W/TWL LRG LVL3 (GOWN DISPOSABLE) ×2
GOWN STRL REUS W/TWL XL LVL3 (GOWN DISPOSABLE)
GUARD TEETH (MISCELLANEOUS) ×3 IMPLANT
MARKER SKIN DUAL TIP RULER LAB (MISCELLANEOUS) IMPLANT
NDL SAFETY ECLIPSE 18X1.5 (NEEDLE) ×1 IMPLANT
NEEDLE HYPO 18GX1.5 SHARP (NEEDLE) ×2
NEEDLE SPNL 22GX7 QUINCKE BK (NEEDLE) IMPLANT
NS IRRIG 1000ML POUR BTL (IV SOLUTION) ×3 IMPLANT
PAD ALCOHOL SWAB (MISCELLANEOUS) IMPLANT
PATTIES SURGICAL .5 X3 (DISPOSABLE) ×3 IMPLANT
SHEET MEDIUM DRAPE 40X70 STRL (DRAPES) ×3 IMPLANT
SLEEVE SCD COMPRESS KNEE MED (MISCELLANEOUS) ×3 IMPLANT
SOLUTION ANTI FOG 6CC (MISCELLANEOUS) IMPLANT
SOLUTION BUTLER CLEAR DIP (MISCELLANEOUS) ×3 IMPLANT
SPONGE GAUZE 4X4 12PLY STER LF (GAUZE/BANDAGES/DRESSINGS) ×3 IMPLANT
SYR CONTROL 10ML LL (SYRINGE) ×3 IMPLANT
TOWEL OR 17X24 6PK STRL BLUE (TOWEL DISPOSABLE) ×3 IMPLANT
TUBE CONNECTING 20'X1/4 (TUBING) ×1
TUBE CONNECTING 20X1/4 (TUBING) ×2 IMPLANT

## 2014-07-31 NOTE — Transfer of Care (Signed)
Immediate Anesthesia Transfer of Care Note  Patient: [name]  Procedure(s) Performed: Procedure(s): MICROLARYNGOSCOPY WITH BIOPSY  (N/A)  Patient Location: PACU  Anesthesia Type:General  Level of Consciousness: sedated and patient cooperative  Airway & Oxygen Therapy: Patient Spontanous Breathing, Patient connected to face mask and aerosol face mask  Post-op Assessment: Report given to RN and Post -op Vital signs reviewed and stable  Post vital signs: Reviewed and stable  Last Vitals:  Filed Vitals:   07/31/14 0619  BP: 168/98  Pulse: 134  Temp: 37.2 C  Resp: 18    Complications: No apparent anesthesia complications

## 2014-07-31 NOTE — Op Note (Signed)
NAMECOLTEN, DESROCHES NO.:  [id number]  MEDICAL RECORD NO.:  [id number]  LOCATION:                               FACILITY:  [facility]  PHYSICIAN:  Leonides Sake. Lucia Gaskins, M.D.DATE OF BIRTH:  23-Feb-1961  DATE OF PROCEDURE:  07/31/2014 DATE OF DISCHARGE:  07/31/2014                              OPERATIVE REPORT   PREOPERATIVE DIAGNOSES:  Chronic hoarseness and sore throat.  POSTOPERATIVE DIAGNOSES:  Chronic hoarseness and sore throat.  OPERATION PERFORMED:  Direct laryngoscopy with biopsy, cervical esophagoscopy.  SURGEON:  Leonides Sake. Lucia Gaskins, M.D.  ANESTHESIA:  General endotracheal.  COMPLICATIONS:  None.  BRIEF CLINICAL NOTE:  [name] is a 54 year old gentleman who presented to the emergency room 2 or 3 weeks ago with hoarseness and sore throat.  He had a CT scan and there is a question about possible cancer because of the patient's heavy history of smoking, 2 packs a day and heavy alcohol use.  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  He states that he has had some voice problems for several months now.  He had no palpable adenopathy in his neck of any significance.  DESCRIPTION OF PROCEDURE:  After adequate endotracheal anesthesia, direct laryngoscopy was performed.  The base of tongue was clear. Palpation of base of tongue was soft, with no palpable nodules or masses.  On examination of the tonsillar area, both tonsils were small. On palpation, the left tonsil was little bit firmer than the right tonsil.  The epiglottis was clear, laryngeal surface likewise.  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  The laryngoscope was suspended.  The false cords were relatively clear.  Ventricles were clear.  Biopsies were obtained from both the left and right vocal cords.  Following this biopsy, further direct laryngoscopy was performed.  The piriform sinuses were evaluated, both piriform sinuses were clear.  The cervical esophagoscope was passed down through the upper esophageal sphincter into the upper cervical esophagus and no gross mucosal lesions was noted of the upper cervical esophagus.  Finally, a biopsy was obtained from the slightly firm left tonsil region and sent as a separate specimen. This completed the procedure.  Hemostasis was obtained with topical adrenaline and suctioned.  Following completion, the patient was awoken from anesthesia and transferred to recovery room postop doing well.  DISPOSITION:  [name] is discharged home later this morning.  He is given hydrocodone 5 mg/325 mg tablet for pain.  We will have him follow up in my office in 3 days for recheck to review final pathology.          ______________________________ Leonides Sake Lucia Gaskins, M.D.     CEN/MEDQ  D:  07/31/2014  T:  07/31/2014  Job:  [id number]  cc:   Leonides Sake. Lucia Gaskins, M.D.

## 2014-07-31 NOTE — Op Note (Deleted)
NAMENAITIK, HERMANN NO.:  [id number]  MEDICAL RECORD NO.:  [id number]  LOCATION:                               FACILITY:  [facility]  PHYSICIAN:  Leonides Sake. Lucia Gaskins, M.D.DATE OF BIRTH:  August 11, 1960  DATE OF PROCEDURE:  07/31/2014 DATE OF DISCHARGE:  07/31/2014                              OPERATIVE REPORT   PREOPERATIVE DIAGNOSES:  Chronic hoarseness and sore throat.  POSTOPERATIVE DIAGNOSES:  Chronic hoarseness and sore throat.  OPERATION PERFORMED:  Direct laryngoscopy with biopsy, cervical esophagoscopy.  SURGEON:  Leonides Sake. Lucia Gaskins, M.D.  ANESTHESIA:  General endotracheal.  COMPLICATIONS:  None.  BRIEF CLINICAL NOTE:  [name] is a 54 year old gentleman who presented to the emergency room 2 or 3 weeks ago with hoarseness and sore throat.  He had a CT scan and there is a question about possible cancer because of the patient's heavy history of smoking, 2 packs a day and heavy alcohol use.  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  He states that he has had some voice problems for several months now.  He had no palpable adenopathy in his neck of any significance.  DESCRIPTION OF PROCEDURE:  After adequate endotracheal anesthesia, direct laryngoscopy was performed.  The base of tongue was clear. Palpation of base of tongue was soft, with no palpable nodules or masses.  On examination of the tonsillar area, both tonsils were small. On palpation, the left tonsil was little bit firmer than the right tonsil.  The epiglottis was clear, laryngeal surface likewise.  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  The laryngoscope was suspended.  The false cords were relatively clear.  Ventricles were clear.  Biopsies were obtained from both the left and right vocal cords.  Following this biopsy, further direct laryngoscopy was performed.  The piriform sinuses were evaluated, both piriform sinuses were clear.  The cervical esophagoscope was passed down through the upper esophageal sphincter into the upper cervical esophagus and no gross mucosal lesions was noted of the upper cervical esophagus.  Finally, a biopsy was obtained from the slightly firm left tonsil region and sent as a separate specimen. This completed the procedure.  Hemostasis was obtained with topical adrenaline and suctioned.  Following completion, the patient was awoken from anesthesia and transferred to recovery room postop doing well.  DISPOSITION:  [name] is discharged home later this morning.  He is given hydrocodone 5 mg/325 mg tablet for pain.  We will have him follow up in my office in 3 days for recheck to review final pathology.          ______________________________ Leonides Sake Lucia Gaskins, M.D.     CEN/MEDQ  D:  07/31/2014  T:  07/31/2014  Job:  [id number]  cc:   Leonides Sake. Lucia Gaskins, M.D.

## 2014-07-31 NOTE — Anesthesia Postprocedure Evaluation (Signed)
Anesthesia Post Note  Patient: [name]  Procedure(s) Performed: Procedure(s) (LRB): MICROLARYNGOSCOPY WITH BIOPSY  (N/A)  Anesthesia type: general  Patient location: PACU  Post pain: Pain level controlled  Post assessment: Patient's Cardiovascular Status Stable  Last Vitals:  Filed Vitals:   07/31/14 1000  BP: 148/89  Pulse: 100  Temp: 36.7 C  Resp: 18    Post vital signs: Reviewed and stable  Level of consciousness: sedated  Complications: No apparent anesthesia complications

## 2014-07-31 NOTE — Discharge Instructions (Addendum)
Tylenol, motrin or hydrocodone prn pain You can also use mouth lozenges for sore throat Return to see Dr Lucia Gaskins this Friday at 4 to review results of the biopsies     Post Anesthesia Home Care Instructions  Activity: Get plenty of rest for the remainder of the day. A responsible adult should stay with you for 24 hours following the procedure.  For the next 24 hours, DO NOT: -Drive a car -Paediatric nurse -Drink alcoholic beverages -Take any medication unless instructed by your physician -Make any legal decisions or sign important papers.  Meals: Start with liquid foods such as gelatin or soup. Progress to regular foods as tolerated. Avoid greasy, spicy, heavy foods. If nausea and/or vomiting occur, drink only clear liquids until the nausea and/or vomiting subsides. Call your physician if vomiting continues.  Special Instructions/Symptoms: Your throat may feel dry or sore from the anesthesia or the breathing tube placed in your throat during surgery. If this causes discomfort, gargle with warm salt water. The discomfort should disappear within 24 hours.

## 2014-07-31 NOTE — Interval H&P Note (Signed)
History and Physical Interval Note:  07/31/2014 7:33 AM  [name]  has presented today for surgery, with the diagnosis of sore throat   The various methods of treatment have been discussed with the patient and family. After consideration of risks, benefits and other options for treatment, the patient has consented to  Procedure(s): MICROLARYNGOSCOPY WITH BIOPSY  (N/A) as a surgical intervention .  The patient's history has been reviewed, patient examined, no change in status, stable for surgery.  I have reviewed the patient's chart and labs.  Questions were answered to the patient's satisfaction.     NEWMAN, CHRISTOPHER

## 2014-07-31 NOTE — H&P (View-Only) (Signed)
PREOPERATIVE H&P  Chief Complaint: sore throat and hoarseness  HPI: [name] is a 54 y.o. male who presents for evaluation of sore throat and hoarseness for several weeks referred by the ER. He has a significant history of smoking and alcohol use. 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 He's taken to the OR for DL and Bx.  Past Medical History  Diagnosis Date  . 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 Back pain   . Pancreatitis   . Loose, teeth   . Alcoholic   . Heavy smoker    Past Surgical History  Procedure Laterality Date  . Knee surgery  2013    rt  . Neck surgery  2013    cervical fusion-  . Artery repair  2013    neck   History   Social History  . Marital Status: Single    Spouse Name: N/A  . Number of Children: N/A  . Years of Education: N/A   Social History Main Topics  . Smoking status: Current Every Day Smoker -- 2.00 packs/day    Types: Cigarettes  . Smokeless tobacco: Not on file  . Alcohol Use: Yes     Comment: alcoholic (4 34'H a day)  . Drug Use: No  . Sexual Activity: Not on file   Other Topics Concern  . None   Social History Narrative   History reviewed. No pertinent family history. Allergies  Allergen Reactions  . Penicillins Other (See Comments)    Reaction as a child-unknown   Prior to Admission medications   Medication Sig Start Date End Date Taking? Authorizing Provider  Aspirin-Acetaminophen-Caffeine (GOODY HEADACHE PO) Take 2 packets by mouth daily as needed. For pain    Historical Provider, MD     Positive ROS: sore throat and hoarseness for 2 months  All other systems have been reviewed and were otherwise negative with the exception of those mentioned in the HPI and as above.  Physical Exam: There were no vitals filed for this visit.  General: Alert, no acute distress Oral: Normal oral mucosa and tonsils Nasal: Clear nasal passages FOL revealed clear nasopharynx and BOT  inflammatory changes of both VCs, OK mobility Neck: No palpable adenopathy or thyroid nodules Ear: Ear canal is clear with normal appearing TMs Cardiovascular: Regular rate and rhythm, no murmur.  Respiratory: Clear to auscultation Neurologic: Alert and oriented x 3   Assessment/Plan: sore throat Plan for Procedure(s): MICROLARYNGOSCOPY AND BIOPSY   Melony Overly, MD 07/26/2014 12:58 PM 2

## 2014-07-31 NOTE — Brief Op Note (Signed)
07/31/2014  8:29 AM  PATIENT:  Payton Spark  54 y.o. male  PRE-OPERATIVE DIAGNOSIS:  sore throat   POST-OPERATIVE DIAGNOSIS:  Hoarseness & Sore Throat  PROCEDURE:  Procedure(s): MICROLARYNGOSCOPY WITH BIOPSY  (N/A) Cervical Esophagoscopy  SURGEON:  Surgeon(s) and Role:    * Rozetta Nunnery, MD - Primary  PHYSICIAN ASSISTANT:   ASSISTANTS: none   ANESTHESIA:   general  EBL:     BLOOD ADMINISTERED:none  DRAINS: none   LOCAL MEDICATIONS USED:  NONE  SPECIMEN:  Source of Specimen:  left and right vocal cords, left tonsil  DISPOSITION OF SPECIMEN:  PATHOLOGY  COUNTS:  YES  TOURNIQUET:  * No tourniquets in log *  DICTATION: .Other Dictation: Dictation Number [id number]  PLAN OF CARE: Discharge to home after PACU  PATIENT DISPOSITION:  PACU - hemodynamically stable.   Delay start of Pharmacological VTE agent (>24hrs) due to surgical blood loss or risk of bleeding: not applicable

## 2014-07-31 NOTE — Anesthesia Procedure Notes (Signed)
Procedure Name: Intubation Date/Time: 07/31/2014 7:40 AM Performed by: Marrianne Mood Pre-anesthesia Checklist: Patient identified, Emergency Drugs available, Suction available, Patient being monitored and Timeout performed Patient Re-evaluated:Patient Re-evaluated prior to inductionOxygen Delivery Method: Circle System Utilized Preoxygenation: Pre-oxygenation with 100% oxygen Intubation Type: IV induction Ventilation: Mask ventilation without difficulty Laryngoscope Size: Miller and 3 Grade View: Grade III Tube type: Oral Tube size: 6.5 mm Number of attempts: 1 Airway Equipment and Method: Stylet and Oral airway Placement Confirmation: ETT inserted through vocal cords under direct vision,  positive ETCO2 and breath sounds checked- equal and bilateral Secured at: 22 cm Tube secured with: Tape Dental Injury: Teeth and Oropharynx as per pre-operative assessment

## 2014-08-02 ENCOUNTER — Encounter (HOSPITAL_BASED_OUTPATIENT_CLINIC_OR_DEPARTMENT_OTHER): Payer: Self-pay | Admitting: Otolaryngology

## 2014-08-06 LAB — POCT HEMOGLOBIN-HEMACUE: Hemoglobin: 14.9 g/dL (ref 13.0–17.0)

## 2016-08-21 ENCOUNTER — Emergency Department (HOSPITAL_COMMUNITY)
Admission: EM | Admit: 2016-08-21 | Discharge: 2016-08-21 | Disposition: A | Discharge status: Home / Self Care | Payer: Medicaid Other | Attending: Emergency Medicine | Admitting: Emergency Medicine

## 2016-08-21 ENCOUNTER — Encounter (HOSPITAL_COMMUNITY): Payer: Self-pay

## 2016-08-21 DIAGNOSIS — F1410 Cocaine abuse, uncomplicated: Secondary | ICD-10-CM | POA: Insufficient documentation

## 2016-08-21 DIAGNOSIS — F1910 Other psychoactive substance abuse, uncomplicated: Secondary | ICD-10-CM

## 2016-08-21 DIAGNOSIS — F1010 Alcohol abuse, uncomplicated: Secondary | ICD-10-CM | POA: Insufficient documentation

## 2016-08-21 DIAGNOSIS — F17210 Nicotine dependence, cigarettes, uncomplicated: Secondary | ICD-10-CM | POA: Insufficient documentation

## 2016-08-21 DIAGNOSIS — Z79899 Other long term (current) drug therapy: Secondary | ICD-10-CM | POA: Insufficient documentation

## 2016-08-21 LAB — RAPID URINE DRUG SCREEN, HOSP PERFORMED
Amphetamines: NOT DETECTED
Barbiturates: NOT DETECTED
Benzodiazepines: NOT DETECTED
COCAINE: POSITIVE — AB
OPIATES: NOT DETECTED
Tetrahydrocannabinol: NOT DETECTED

## 2016-08-21 LAB — COMPREHENSIVE METABOLIC PANEL
ALBUMIN: 3.6 g/dL (ref 3.5–5.0)
ALK PHOS: 65 U/L (ref 38–126)
ALT: 12 U/L — ABNORMAL LOW (ref 17–63)
AST: 22 U/L (ref 15–41)
Anion gap: 13 (ref 5–15)
CALCIUM: 9 mg/dL (ref 8.9–10.3)
CO2: 19 mmol/L — ABNORMAL LOW (ref 22–32)
CREATININE: 0.67 mg/dL (ref 0.61–1.24)
Chloride: 97 mmol/L — ABNORMAL LOW (ref 101–111)
GFR calc Af Amer: >60 mL/min (ref >60)
Glucose, Bld: 82 mg/dL (ref 65–99)
Potassium: 4.5 mmol/L (ref 3.5–5.1)
Sodium: 129 mmol/L — ABNORMAL LOW (ref 135–145)
TOTAL PROTEIN: 7.6 g/dL (ref 6.5–8.1)
Total Bilirubin: 0.7 mg/dL (ref 0.3–1.2)

## 2016-08-21 LAB — CBC
HCT: 45.8 % (ref 39.0–52.0)
Hemoglobin: 15.8 g/dL (ref 13.0–17.0)
MCH: 33.3 pg (ref 26.0–34.0)
MCHC: 34.5 g/dL (ref 30.0–36.0)
MCV: 96.4 fL (ref 78.0–100.0)
Platelets: 322 10*3/uL (ref 150–400)
RBC: 4.75 MIL/uL (ref 4.22–5.81)
RDW: 13.9 % (ref 11.5–15.5)
WBC: 6.5 10*3/uL (ref 4.0–10.5)

## 2016-08-21 LAB — LIPASE, BLOOD: Lipase: 53 U/L — ABNORMAL HIGH (ref 11–51)

## 2016-08-21 LAB — ETHANOL: ALCOHOL ETHYL (B): 190 mg/dL — AB (ref <5)

## 2016-08-21 MED ORDER — ONDANSETRON HCL 4 MG PO TABS: 4.0000 mg | ORAL_TABLET | Freq: Three times a day (TID) | ORAL | Status: DC | PRN

## 2016-08-21 MED ORDER — LORAZEPAM 1 MG PO TABS
1.0000 mg | ORAL_TABLET | Freq: Once | ORAL | Status: AC
Start: 2016-08-21 — End: 2016-08-21
  Administered 2016-08-21: 1 mg via ORAL
  Filled 2016-08-21: qty 1

## 2016-08-21 MED ORDER — LORAZEPAM 1 MG PO TABS: 0.0000 mg | ORAL_TABLET | Freq: Four times a day (QID) | ORAL | Status: DC

## 2016-08-21 MED ORDER — LORAZEPAM 1 MG PO TABS
1.0000 mg | ORAL_TABLET | Freq: Once | ORAL | Status: AC
  Administered 2016-08-21: 1 mg via ORAL
  Filled 2016-08-21: qty 1

## 2016-08-21 MED ORDER — LORAZEPAM 1 MG PO TABS: 0.0000 mg | ORAL_TABLET | Freq: Two times a day (BID) | ORAL | Status: DC

## 2016-08-21 NOTE — ED Notes (Signed)
Pt's belongings given back to pt. "everything is present here".

## 2016-08-21 NOTE — ED Triage Notes (Signed)
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 Pt reports last drink this AM around 0730, reports typically drinks 12 pack of beer daily x 20 years. PT reports has been through detox in past, denies seizure activity in past. Pt AxO x4.

## 2016-08-21 NOTE — ED Notes (Signed)
Pt is aware urine is needed, urinal at bedside at this time.

## 2016-08-21 NOTE — Discharge Instructions (Signed)
Hre are some resources to help you quit alcohol. Please return with any c, symptoms, seizures, withdrawal, suicidality or other concerns.  Substance Abuse Treatment Programs  Intensive Outpatient Programs [facility]     [address], [place]       [facility] [place] [address] [place], [place]  [facility]     (Inpatient and outpatient)     [address]           [place] [phone] (Suboxone and Methadone)  [place], [place] [address]      [place] [address] [place], [place]  [facility] [place] (Outpatient/Inpatient, Chemical)    (insurance only) [phone]             [facility] ([place]) [place], [place]     [facility]     [address]     [place], [place]       [facility] (for caregivers and family) [phone] [address] Kristeen Mans. [place], [place]      Residential Treatment Programs [facility]      [address] [place], [place] [address]  [phone]       [facility] [address] [place], [place] [address] [phone]  [facility] [place]        [phone]       [facility] [place] [phone]  [facility] ([place]             [place], [place] or [place], [address] [phone]  [facility] [place]    [address]      [place], [place]       [facility] [address] [place], [place]  [place]   [address] [place],  [place] [address]     [phone]      Admissions: 8am-3pm M-F  [facility] (RTS) [address] [place], [place]  [facility] Program: Residential Program [phone] Days)   [place], [place] or [phone]     [facility]: [employer] [place], [place] (Walk in Hours over the weekend or by referral)  [facility] [place], [place],  [address] [phone]  Crisis Mobile: [facility]:  [phone] (for crisis response  24 hours a day) [place]:      [phone] Outpatient Psychiatry and Counseling  [facility]: Mobile Crisis Management 24 hours:  [phone]  [facility] of the [employer] sliding scale fee and walk in schedule: M-F 8am-12pm/1pm-3pm [place], [place] [address] [place], [place] [address] [phone]  [facility] ([facility])- new patient walk-in appointments available Monday - Friday 8am -3pm.          [address] [place], [place] [address] [phone] or crisis line- [facility]/ Intensive Outpatient Therapy Program [place], [place] [address] [place]      [phone] [place], [place] [address]                 [place]   [facility] [phone]. [address] [place], [place] [address]   [employer] [facility]          [address] Johnette Abraham  [place], [place] [address]       [phone]  [facility] [address] [place], [place] [address] [phone]  [facility]    [address] [place], [place] [address]     [place],  [place] Joycelyn Man     [place] [address]     [phone]       [facility] [place] [address]  [facility]     [address], [place], [place], [place] [address] [place]     [address]     [place], [place] [address]     [phone]       [facility] [address] [place], [place] [address] [phone] Resources for Temporary Residential Assistance/Crisis [facility]) M-F 8am-3pm   [address] [place], [place] [address]   [phone] Services include: laundry, barbering, support groups, case management, phone  & computer access, showers, AA/NA mtgs, mental health/substance abuse nurse, job skills class, disability information, VA assistance, spiritual classes, etc.   [facility]   [address] [place]     [place]              [employer] (women and children)       [address]. [place], [place] [address] [phone] [email or web address] for application and process Application Required  [facility]   [address]    [place] [address]     [phone]                    [place], [place] [address] [phone]([email or web address] application appt.) Application Required  [employer] (women only)  [place], [place] [address]     [phone]      Intake starts 6pm daily Need valid ID, SSC, & Police report [employer] [address] [place], [place] [phone] Application Required  [employer] (men only)     [place].      [place],  [place]       [place] (Pregnant women only) [address] [place], [place]  [facility]      [place] Dani [address].      [place], [place] [address]     [phone]             [facility] [address] [place], [place] 90 day commitment/SA/Application process  [facility](men only)     [address]     [place], [place]       Check-in at [facility] [address] [place],  [address] [phone] Men/Women/Women and Children must be there by 7 pm  [place], [place]

## 2016-08-21 NOTE — ED Notes (Signed)
Pt states that he feels better now. Denies suicidal ideations. MD at the bedside and aware of request.

## 2016-08-21 NOTE — ED Notes (Signed)
Pt moved over to [place] for eval of TTS pt in no distress agrees with plan, belongings including cell phone, cigarettes, license, black sneakers, camo jacket, t-shirts and pants placed in bags behind Nurses station

## 2016-08-21 NOTE — ED Provider Notes (Addendum)
[facility] DEPT Provider Note   CSN: [id number] Arrival date & time: 08/21/16  1015     History   Chief Complaint Chief Complaint  Patient presents with  . detox    HPI [name] is a 56 y.o. male.  The patient is has long history of opiate pill abuse as well as alcohol abuse. He is here for detox. Patient states that he is not suicidal. Patient had been through rehabilitation in the past but it was several years ago. Patient states his last alcohol was this morning.      Past Medical History:  Diagnosis Date  . Alcoholic ([place])   . Back pain   . Heavy smoker   . Hep C w/ coma, chronic ([place])   . Loose, teeth   . Pancreatitis     There are no active problems to display for this patient.   Past Surgical History:  Procedure Laterality Date  . ARTERY REPAIR  2013   neck  . KNEE SURGERY  2013   rt  . MICROLARYNGOSCOPY N/A 07/31/2014   Procedure: MICROLARYNGOSCOPY WITH BIOPSY ;  Surgeon: Rozetta Nunnery, MD;  Location: [facility];  Service: ENT;  Laterality: N/A;  . NECK SURGERY  2013   cervical fusion-       Home Medications    Prior to Admission medications   Medication Sig Start Date End Date Taking? Authorizing Provider  Aspirin-Acetaminophen-Caffeine (GOODY HEADACHE PO) Take 2 packets by mouth daily as needed. For pain   Yes Historical Provider, MD  loratadine (CLARITIN) 10 MG tablet Take 10 mg by mouth daily.   Yes Historical Provider, MD  HYDROcodone-acetaminophen (NORCO/VICODIN) 5-325 MG per tablet Take 1 tablet by mouth every 6 (six) hours as needed for moderate pain. Patient not taking: Reported on 08/21/2016 07/31/14   Rozetta Nunnery, MD    Family History No family history on file.  Social History Social History  Substance Use Topics  . Smoking status: Current Every Day Smoker    Packs/day: 2.00    Types: Cigarettes  . Smokeless tobacco: Never Used  . Alcohol use Yes     Comment: alcoholic (4 96'P a day)      Allergies   Penicillins   Review of Systems Review of Systems  Constitutional: Negative for fever.  HENT: Negative for congestion.   Respiratory: Negative for shortness of breath.   Cardiovascular: Negative for chest pain.  Gastrointestinal: Positive for abdominal pain. Negative for nausea and vomiting.  Genitourinary: Negative for dysuria and hematuria.  Musculoskeletal: Negative for back pain.  Neurological: Negative for headaches.  Hematological: Does not bruise/bleed easily.  Psychiatric/Behavioral: Negative for confusion and suicidal ideas.     Physical Exam Updated Vital Signs BP (!) 145/92   Pulse 80   Temp 98.1 F (36.7 C) (Oral)   Resp 19   Ht 5\' 8"  (1.727 m)   Wt 68 kg   SpO2 97%   BMI 22.81 kg/m   Physical Exam  Constitutional: He is oriented to person, place, and time. He appears well-developed and well-nourished. No distress.  HENT:  Head: Normocephalic and atraumatic.  Eyes: EOM are normal. Pupils are equal, round, and reactive to light.  Neck: Normal range of motion. Neck supple.  Cardiovascular: Normal rate, regular rhythm and normal heart sounds.   Pulmonary/Chest: Effort normal and breath sounds normal.  Abdominal: Soft. Bowel sounds are normal. There is no tenderness.  Musculoskeletal: Normal range of motion.  Neurological: He is alert and  oriented to person, place, and time.  Skin: Skin is warm.  Nursing note and vitals reviewed.    ED Treatments / Results  Labs (all labs ordered are listed, but only abnormal results are displayed) Labs Reviewed  COMPREHENSIVE METABOLIC PANEL - Abnormal; Notable for the following:       Result Value   Sodium 129 (*)    Chloride 97 (*)    CO2 19 (*)    BUN <5 (*)    ALT 12 (*)    All other components within normal limits  ETHANOL - Abnormal; Notable for the following:    Alcohol, Ethyl (B) 190 (*)    All other components within normal limits  RAPID URINE DRUG SCREEN, HOSP PERFORMED - Abnormal;  Notable for the following:    Cocaine POSITIVE (*)    All other components within normal limits  LIPASE, BLOOD - Abnormal; Notable for the following:    Lipase 53 (*)    All other components within normal limits  CBC   Results for orders placed or performed during the hospital encounter of 08/21/16  Comprehensive metabolic panel  Result Value Ref Range   Sodium 129 (L) 135 - 145 mmol/L   Potassium 4.5 3.5 - 5.1 mmol/L   Chloride 97 (L) 101 - 111 mmol/L   CO2 19 (L) 22 - 32 mmol/L   Glucose, Bld 82 65 - 99 mg/dL   BUN <5 (L) 6 - 20 mg/dL   Creatinine, Ser 0.67 0.61 - 1.24 mg/dL   Calcium 9.0 8.9 - 10.3 mg/dL   Total Protein 7.6 6.5 - 8.1 g/dL   Albumin 3.6 3.5 - 5.0 g/dL   AST 22 15 - 41 U/L   ALT 12 (L) 17 - 63 U/L   Alkaline Phosphatase 65 38 - 126 U/L   Total Bilirubin 0.7 0.3 - 1.2 mg/dL   GFR calc non Af Amer >60 >60 mL/min   GFR calc Af Amer >60 >60 mL/min   Anion gap 13 5 - 15  Ethanol  Result Value Ref Range   Alcohol, Ethyl (B) 190 (H) <5 mg/dL  cbc  Result Value Ref Range   WBC 6.5 4.0 - 10.5 K/uL   RBC 4.75 4.22 - 5.81 MIL/uL   Hemoglobin 15.8 13.0 - 17.0 g/dL   HCT 45.8 39.0 - 52.0 %   MCV 96.4 78.0 - 100.0 fL   MCH 33.3 26.0 - 34.0 pg   MCHC 34.5 30.0 - 36.0 g/dL   RDW 13.9 11.5 - 15.5 %   Platelets 322 150 - 400 K/uL  Rapid urine drug screen (hospital performed)  Result Value Ref Range   Opiates NONE DETECTED NONE DETECTED   Cocaine POSITIVE (A) NONE DETECTED   Benzodiazepines NONE DETECTED NONE DETECTED   Amphetamines NONE DETECTED NONE DETECTED   Tetrahydrocannabinol NONE DETECTED NONE DETECTED   Barbiturates NONE DETECTED NONE DETECTED  Lipase, blood  Result Value Ref Range   Lipase 53 (H) 11 - 51 U/L     EKG  EKG Interpretation None       Radiology No results found.  Procedures Procedures (including critical care time)  Medications Ordered in ED Medications  LORazepam (ATIVAN) tablet 1 mg (not administered)  LORazepam (ATIVAN)  tablet 1 mg (1 mg Oral Given 08/21/16 1135)     Initial Impression / Assessment and Plan / ED Course  I have reviewed the triage vital signs and the nursing notes.  Pertinent labs & imaging results that were available  during my care of the patient were reviewed by me and considered in my medical decision making (see chart for details).    Patient with a history of opiate and alcohol abuse. Last drink was this morning. Patient here for polysubstance abuse detox. Patient medically cleared. Patient treated with Ativan 2 which is helped. Patient denies any suicidal or homicidal ideations. Patient consult at the behavioral health.   The patient's of abdominal pain now with no sniffing tenderness on exam. Lipase normal. Liver function tests without sniffing abnormalities. No evidence of an acute abdomen.   Patient medically cleared.  Final Clinical Impressions(s) / ED Diagnoses   Final diagnoses:  Polysubstance abuse    New Prescriptions New Prescriptions   No medications on file     Fredia Sorrow, MD 08/21/16 [place], MD 08/21/16 1549

## 2016-08-21 NOTE — ED Notes (Signed)
Pt appears agitated ER MD aware orders received and carried out

## 2016-08-21 NOTE — ED Notes (Signed)
Pt discharged

## 2016-08-21 NOTE — ED Provider Notes (Signed)
I was called because patient likes to leave AMA. No wear in the chart this patient say he is SI or HI. I double checked and asked the patient whether he was suicidal or homicidal he denies this. I believe the patient is making rational decisions. Patient like to go home. We cannot keep him here against his will as UI believes making rational decisions. We will give him information about drug abuse for home.   Louetta Hollingshead Julio Alm, MD 08/21/16 1701

## 2017-01-31 ENCOUNTER — Encounter (HOSPITAL_COMMUNITY): Payer: Self-pay | Admitting: Emergency Medicine

## 2017-01-31 ENCOUNTER — Emergency Department (HOSPITAL_COMMUNITY): Payer: Self-pay

## 2017-01-31 ENCOUNTER — Inpatient Hospital Stay (HOSPITAL_COMMUNITY)
Admission: EM | Admit: 2017-01-31 | Discharge: 2017-02-03 | DRG: 896 | Disposition: A | Discharge status: Home / Self Care | Payer: Self-pay | Attending: Pulmonary Disease | Admitting: Pulmonary Disease

## 2017-01-31 DIAGNOSIS — F10231 Alcohol dependence with withdrawal delirium: Principal | ICD-10-CM | POA: Diagnosis present

## 2017-01-31 DIAGNOSIS — K2920 Alcoholic gastritis without bleeding: Secondary | ICD-10-CM

## 2017-01-31 DIAGNOSIS — G9340 Encephalopathy, unspecified: Secondary | ICD-10-CM | POA: Diagnosis present

## 2017-01-31 DIAGNOSIS — E871 Hypo-osmolality and hyponatremia: Secondary | ICD-10-CM | POA: Diagnosis present

## 2017-01-31 DIAGNOSIS — Z9889 Other specified postprocedural states: Secondary | ICD-10-CM

## 2017-01-31 DIAGNOSIS — I10 Essential (primary) hypertension: Secondary | ICD-10-CM | POA: Diagnosis present

## 2017-01-31 DIAGNOSIS — D649 Anemia, unspecified: Secondary | ICD-10-CM | POA: Diagnosis present

## 2017-01-31 DIAGNOSIS — F10151 Alcohol abuse with alcohol-induced psychotic disorder with hallucinations: Secondary | ICD-10-CM | POA: Diagnosis present

## 2017-01-31 DIAGNOSIS — Z79899 Other long term (current) drug therapy: Secondary | ICD-10-CM

## 2017-01-31 DIAGNOSIS — F17210 Nicotine dependence, cigarettes, uncomplicated: Secondary | ICD-10-CM | POA: Diagnosis present

## 2017-01-31 DIAGNOSIS — Z880 Allergy status to penicillin: Secondary | ICD-10-CM

## 2017-01-31 DIAGNOSIS — F10931 Alcohol use, unspecified with withdrawal delirium: Secondary | ICD-10-CM

## 2017-01-31 LAB — GLUCOSE, CAPILLARY: GLUCOSE-CAPILLARY: 96 mg/dL (ref 65–99)

## 2017-01-31 LAB — COMPREHENSIVE METABOLIC PANEL
ALBUMIN: 3.4 g/dL — AB (ref 3.5–5.0)
ALK PHOS: 74 U/L (ref 38–126)
ALT: 17 U/L (ref 17–63)
ANION GAP: 15 (ref 5–15)
AST: 23 U/L (ref 15–41)
BILIRUBIN TOTAL: 1.4 mg/dL — AB (ref 0.3–1.2)
BUN: 5 mg/dL — AB (ref 6–20)
CALCIUM: 9.4 mg/dL (ref 8.9–10.3)
CHLORIDE: 88 mmol/L — AB (ref 101–111)
CO2: 22 mmol/L (ref 22–32)
CREATININE: 0.87 mg/dL (ref 0.61–1.24)
GFR calc Af Amer: >60 mL/min (ref >60)
GLUCOSE: 96 mg/dL (ref 65–99)
Potassium: 3.6 mmol/L (ref 3.5–5.1)
Sodium: 125 mmol/L — ABNORMAL LOW (ref 135–145)
Total Protein: 7.9 g/dL (ref 6.5–8.1)

## 2017-01-31 LAB — CBC WITH DIFFERENTIAL/PLATELET
Basophils Absolute: 0 10*3/uL (ref 0.0–0.1)
Basophils Relative: 0 %
EOS ABS: 0 10*3/uL (ref 0.0–0.7)
Eosinophils Relative: 0 %
HCT: 36.1 % — ABNORMAL LOW (ref 39.0–52.0)
HEMOGLOBIN: 11.4 g/dL — AB (ref 13.0–17.0)
LYMPHS ABS: 0.7 10*3/uL (ref 0.7–4.0)
Lymphocytes Relative: 17 %
MCH: 24.9 pg — AB (ref 26.0–34.0)
MCHC: 31.6 g/dL (ref 30.0–36.0)
MCV: 79 fL (ref 78.0–100.0)
MONO ABS: 0.3 10*3/uL (ref 0.1–1.0)
MONOS PCT: 8 %
Neutro Abs: 3.3 10*3/uL (ref 1.7–7.7)
Neutrophils Relative %: 75 %
Platelets: 337 10*3/uL (ref 150–400)
RBC: 4.57 MIL/uL (ref 4.22–5.81)
RDW: 17.5 % — AB (ref 11.5–15.5)
WBC: 4.5 10*3/uL (ref 4.0–10.5)

## 2017-01-31 LAB — I-STAT TROPONIN, ED: Troponin i, poc: 0.02 ng/mL (ref 0.00–0.08)

## 2017-01-31 LAB — ETHANOL: Alcohol, Ethyl (B): <5 mg/dL (ref <5)

## 2017-01-31 LAB — MRSA PCR SCREENING: MRSA BY PCR: POSITIVE — AB

## 2017-01-31 LAB — LIPASE, BLOOD: LIPASE: 35 U/L (ref 11–51)

## 2017-01-31 MED ORDER — LORAZEPAM 2 MG/ML IJ SOLN
2.0000 mg | Freq: Once | INTRAMUSCULAR | Status: AC
  Administered 2017-01-31: 2 mg via INTRAVENOUS

## 2017-01-31 MED ORDER — CHLORHEXIDINE GLUCONATE CLOTH 2 % EX PADS
6.0000 | MEDICATED_PAD | Freq: Every day | CUTANEOUS | Status: DC
  Administered 2017-02-01 – 2017-02-03 (×3): 6 via TOPICAL

## 2017-01-31 MED ORDER — ONDANSETRON HCL 4 MG/2ML IJ SOLN
4.0000 mg | Freq: Once | INTRAMUSCULAR | Status: AC
  Administered 2017-01-31: 4 mg via INTRAVENOUS
  Filled 2017-01-31: qty 2

## 2017-01-31 MED ORDER — FOLIC ACID 1 MG PO TABS: 1.0000 mg | ORAL_TABLET | Freq: Every day | ORAL | 0 refills | Status: DC

## 2017-01-31 MED ORDER — LORAZEPAM 2 MG/ML IJ SOLN
1.0000 mg | Freq: Once | INTRAMUSCULAR | Status: AC
  Administered 2017-01-31: 1 mg via INTRAVENOUS
  Filled 2017-01-31: qty 1

## 2017-01-31 MED ORDER — DEXMEDETOMIDINE HCL IN NACL 400 MCG/100ML IV SOLN
0.2000 ug/kg/h | INTRAVENOUS | Status: DC
  Administered 2017-01-31: 0.7 ug/kg/h via INTRAVENOUS
  Administered 2017-02-01: 0.6 ug/kg/h via INTRAVENOUS
  Administered 2017-02-01: 0.7 ug/kg/h via INTRAVENOUS
  Administered 2017-02-01: 0.2 ug/kg/h via INTRAVENOUS
  Filled 2017-01-31 (×3): qty 100

## 2017-01-31 MED ORDER — DEXMEDETOMIDINE HCL IN NACL 200 MCG/50ML IV SOLN
0.2000 ug/kg/h | INTRAVENOUS | Status: DC
  Administered 2017-01-31: 0.7 ug/kg/h via INTRAVENOUS
  Administered 2017-01-31: 0.5 ug/kg/h via INTRAVENOUS
  Filled 2017-01-31 (×4): qty 50

## 2017-01-31 MED ORDER — DEXMEDETOMIDINE BOLUS VIA INFUSION: 1.0000 ug/kg | Freq: Once | INTRAVENOUS | Status: DC

## 2017-01-31 MED ORDER — FAMOTIDINE IN NACL 20-0.9 MG/50ML-% IV SOLN
20.0000 mg | INTRAVENOUS | Status: DC
  Administered 2017-02-01: 20 mg via INTRAVENOUS
  Filled 2017-01-31: qty 50

## 2017-01-31 MED ORDER — THIAMINE HCL 100 MG/ML IJ SOLN
100.0000 mg | Freq: Every day | INTRAMUSCULAR | Status: DC
  Administered 2017-01-31 – 2017-02-01 (×2): 100 mg via INTRAVENOUS
  Filled 2017-01-31: qty 1

## 2017-01-31 MED ORDER — POTASSIUM CHLORIDE IN NACL 40-0.9 MEQ/L-% IV SOLN
INTRAVENOUS | Status: DC
  Administered 2017-01-31 – 2017-02-01 (×2): 100 mL/h via INTRAVENOUS
  Filled 2017-01-31 (×3): qty 1000

## 2017-01-31 MED ORDER — ENOXAPARIN SODIUM 40 MG/0.4ML ~~LOC~~ SOLN
40.0000 mg | SUBCUTANEOUS | Status: DC
  Administered 2017-01-31: 40 mg via SUBCUTANEOUS
  Filled 2017-01-31 (×2): qty 0.4

## 2017-01-31 MED ORDER — GI COCKTAIL ~~LOC~~
30.0000 mL | Freq: Once | ORAL | Status: AC
  Administered 2017-01-31: 30 mL via ORAL
  Filled 2017-01-31: qty 30

## 2017-01-31 MED ORDER — LORAZEPAM 2 MG/ML IJ SOLN
2.0000 mg | Freq: Once | INTRAMUSCULAR | Status: AC
  Administered 2017-01-31: 2 mg via INTRAVENOUS
  Filled 2017-01-31: qty 1

## 2017-01-31 MED ORDER — SODIUM CHLORIDE 0.9 % IV SOLN: 250.0000 mL | INTRAVENOUS | Status: DC | PRN

## 2017-01-31 MED ORDER — ASPIRIN 81 MG PO CHEW: 324.0000 mg | CHEWABLE_TABLET | ORAL | Status: AC

## 2017-01-31 MED ORDER — VITAMIN B-1 100 MG PO TABS: 100.0000 mg | ORAL_TABLET | Freq: Every day | ORAL | 0 refills | Status: DC

## 2017-01-31 MED ORDER — ORAL CARE MOUTH RINSE
15.0000 mL | Freq: Two times a day (BID) | OROMUCOSAL | Status: DC
  Administered 2017-01-31 – 2017-02-02 (×3): 15 mL via OROMUCOSAL

## 2017-01-31 MED ORDER — ASPIRIN 300 MG RE SUPP
300.0000 mg | RECTAL | Status: AC
  Administered 2017-01-31: 300 mg via RECTAL
  Filled 2017-01-31: qty 1

## 2017-01-31 MED ORDER — SODIUM CHLORIDE 0.9 % IV BOLUS (SEPSIS)
1000.0000 mL | Freq: Once | INTRAVENOUS | Status: AC
  Administered 2017-01-31: 1000 mL via INTRAVENOUS

## 2017-01-31 MED ORDER — FAMOTIDINE IN NACL 20-0.9 MG/50ML-% IV SOLN
20.0000 mg | Freq: Once | INTRAVENOUS | Status: AC
Start: 2017-01-31 — End: 2017-01-31
  Administered 2017-01-31: 20 mg via INTRAVENOUS
  Filled 2017-01-31: qty 50

## 2017-01-31 MED ORDER — ONDANSETRON HCL 4 MG PO TABS: 4.0000 mg | ORAL_TABLET | Freq: Four times a day (QID) | ORAL | 0 refills | Status: DC | PRN

## 2017-01-31 MED ORDER — FAMOTIDINE 20 MG PO TABS: 20.0000 mg | ORAL_TABLET | Freq: Two times a day (BID) | ORAL | 0 refills | Status: DC

## 2017-01-31 MED ORDER — HALOPERIDOL LACTATE 5 MG/ML IJ SOLN
2.0000 mg | Freq: Once | INTRAMUSCULAR | Status: AC
  Administered 2017-01-31: 2 mg via INTRAVENOUS
  Filled 2017-01-31: qty 1

## 2017-01-31 MED ORDER — THIAMINE HCL 100 MG/ML IJ SOLN
100.0000 mg | Freq: Once | INTRAMUSCULAR | Status: DC
Start: 2017-01-31 — End: 2017-01-31
  Filled 2017-01-31: qty 2

## 2017-01-31 MED ORDER — MUPIROCIN 2 % EX OINT
1.0000 "application " | TOPICAL_OINTMENT | Freq: Two times a day (BID) | CUTANEOUS | Status: DC
  Administered 2017-01-31 – 2017-02-02 (×5): 1 via NASAL
  Filled 2017-01-31 (×2): qty 22

## 2017-01-31 MED ORDER — CHLORDIAZEPOXIDE HCL 25 MG PO CAPS: ORAL_CAPSULE | ORAL | 0 refills | Status: DC

## 2017-01-31 MED ORDER — LORAZEPAM 2 MG/ML IJ SOLN
INTRAMUSCULAR | Status: AC
  Filled 2017-01-31: qty 1

## 2017-01-31 NOTE — ED Notes (Signed)
Pt resting.

## 2017-01-31 NOTE — ED Notes (Signed)
Pt sleeping. 

## 2017-01-31 NOTE — ED Notes (Signed)
Pt pulled his IV out, keeps taking all EKG, BP and spo2. RN redirecting pt, pt's speech more slurred. MD aware.

## 2017-01-31 NOTE — ED Notes (Signed)
Pt given crackers peanut butter and water. Pt states no stomach pain or nausea

## 2017-01-31 NOTE — ED Provider Notes (Signed)
[facility] DEPT Provider Note   CSN: [id number] Arrival date & time: 01/31/17  5176     History   Chief Complaint Chief Complaint  Patient presents with  . Abdominal Pain    HPI Lashaun Krapf is a 56 y.o. male.  The history is provided by the patient. No language interpreter was used.  Abdominal Pain     Detron Man is a 56 y.o. male who presents to the Emergency Department complaining of abdominal pain.  E has a history of alcohol abuse and pancreatitis reports he has epigastric pain similar to his pancreatitis. He reports 3 days of moderate epigastric pain with vomiting yesterday. He reports pain with eating food but he is able to drink fluids. No fevers but he feels hot and cold at times. No chest pain, shortness of breath, diarrhea, dysuria. He does drink alcohol regularly, Drinking a 12 pack of beer or a half a fifth of liquor daily. He gets shaky when he doesn't drink but does not get seizures. His last drink was 2 days ago.  Past Medical History:  Diagnosis Date  . Alcoholic ([place])   . Back pain   . Heavy smoker   . Hep C w/ coma, chronic ([place])   . Loose, teeth   . Pancreatitis     There are no active problems to display for this patient.   Past Surgical History:  Procedure Laterality Date  . ARTERY REPAIR  2013   neck  . KNEE SURGERY  2013   rt  . MICROLARYNGOSCOPY N/A 07/31/2014   Procedure: MICROLARYNGOSCOPY WITH BIOPSY ;  Surgeon: Rozetta Nunnery, MD;  Location: [facility];  Service: ENT;  Laterality: N/A;  . NECK SURGERY  2013   cervical fusion-       Home Medications    Prior to Admission medications   Medication Sig Start Date End Date Taking? Authorizing Provider  acetaminophen (TYLENOL) 500 MG tablet Take 1,000 mg by mouth every 6 (six) hours as needed for mild pain.   Yes [provider]  Aspirin-Acetaminophen-Caffeine (GOODY HEADACHE PO) Take 2 packets by mouth daily as needed. For pain   Yes [provider]  loratadine (CLARITIN) 10 MG tablet Take 10 mg by mouth daily.   Yes [provider]  chlordiazePOXIDE (LIBRIUM) 25 MG capsule 50mg  PO TID x 1D, then 25-50mg  PO BID X 1D, then 25-50mg  PO QD X 1D 01/31/17   Quintella Reichert, MD  famotidine (PEPCID) 20 MG tablet Take 1 tablet (20 mg total) by mouth 2 (two) times daily. 01/31/17   Quintella Reichert, MD  folic acid (FOLVITE) 1 MG tablet Take 1 tablet (1 mg total) by mouth daily. 01/31/17   Quintella Reichert, MD  HYDROcodone-acetaminophen (NORCO/VICODIN) 5-325 MG per tablet Take 1 tablet by mouth every 6 (six) hours as needed for moderate pain. Patient not taking: Reported on 08/21/2016 07/31/14   Rozetta Nunnery, MD  ondansetron [facility]) 4 MG tablet Take 1 tablet (4 mg total) by mouth every 6 (six) hours as needed for nausea or vomiting. 01/31/17   Quintella Reichert, MD  thiamine (VITAMIN B-1) 100 MG tablet Take 1 tablet (100 mg total) by mouth daily. 01/31/17   Quintella Reichert, MD    Family History History reviewed. No pertinent family history.  Social History Social History  Substance Use Topics  . Smoking status: Current Every Day Smoker    Packs/day: 2.00    Types: Cigarettes  . Smokeless tobacco: Never Used  .  Alcohol use Yes     Comment: alcoholic (4 65'L a day). 12 pck a day     Allergies   Penicillins   Review of Systems Review of Systems  Gastrointestinal: Positive for abdominal pain.  All other systems reviewed and are negative.    Physical Exam Updated Vital Signs BP 139/83 (BP Location: Left Arm)   Pulse 73   Temp 98.3 F (36.8 C) (Oral)   Resp 20   Ht 5\' 8"  (1.727 m)   Wt 68 kg (150 lb)   SpO2 100%   BMI 22.81 kg/m   Physical Exam  Constitutional: He is oriented to person, place, and time. He appears well-developed and well-nourished.  HENT:  Head: Normocephalic and atraumatic.  Neck:  Healed scar to the anterior neck  Cardiovascular: Normal rate and regular rhythm.   No murmur  heard. Pulmonary/Chest: Effort normal and breath sounds normal. No respiratory distress.  Abdominal: Soft. There is no rebound and no guarding.  Mild epigastric tenderness  Musculoskeletal: He exhibits no edema or tenderness.  Neurological: He is alert and oriented to person, place, and time.  Skin: Skin is warm and dry.  Psychiatric: He has a normal mood and affect. His behavior is normal.  Nursing note and vitals reviewed.    ED Treatments / Results  Labs (all labs ordered are listed, but only abnormal results are displayed) Labs Reviewed  COMPREHENSIVE METABOLIC PANEL - Abnormal; Notable for the following:       Result Value   Sodium 125 (*)    Chloride 88 (*)    BUN 5 (*)    Albumin 3.4 (*)    Total Bilirubin 1.4 (*)    All other components within normal limits  CBC WITH DIFFERENTIAL/PLATELET - Abnormal; Notable for the following:    Hemoglobin 11.4 (*)    HCT 36.1 (*)    MCH 24.9 (*)    RDW 17.5 (*)    All other components within normal limits  LIPASE, BLOOD  ETHANOL  I-STAT TROPONIN, ED    EKG  EKG Interpretation  Date/Time:  Sunday January 31 2017 09:36:19 EDT Ventricular Rate:  117 PR Interval:    QRS Duration: 86 QT Interval:  348 QTC Calculation: 486 R Axis:   -82 Text Interpretation:  Sinus tachycardia Anterior infarct, old Baseline wander in lead(s) V6 Confirmed by Quintella Reichert [id number] on 01/31/2017 11:00:45 AM       Radiology Dg Chest 2 View  Result Date: 01/31/2017 CLINICAL DATA:  Abdominal pain.  Pancreatitis. EXAM: CHEST  2 VIEW COMPARISON:  03/08/2012. FINDINGS: The heart is enlarged. There is mild vascular congestion. No frank edema or consolidation. No effusion or pneumothorax. Bones unremarkable. There is no visible free air. Worsening aeration from priors. IMPRESSION: Cardiomegaly with mild vascular congestion. No definite acute findings. Electronically Signed   By: Staci Righter M.D.   On: 01/31/2017 12:21    Procedures Procedures  (including critical care time) CRITICAL CARE Performed by: Quintella Reichert   Total critical care time: 40 minutes  Critical care time was exclusive of separately billable procedures and treating other patients.  Critical care was necessary to treat or prevent imminent or life-threatening deterioration.  Critical care was time spent personally by me on the following activities: development of treatment plan with patient and/or surrogate as well as nursing, discussions with consultants, evaluation of patient's response to treatment, examination of patient, obtaining history from patient or surrogate, ordering and performing treatments and interventions, ordering and review of  laboratory studies, ordering and review of radiographic studies, pulse oximetry and re-evaluation of patient's condition.  Medications Ordered in ED Medications  thiamine (B-1) injection 100 mg (100 mg Intravenous Not Given 01/31/17 1340)  LORazepam (ATIVAN) 2 MG/ML injection (not administered)  dexmedetomidine (PRECEDEX) 200 MCG/50ML (4 mcg/mL) infusion (0.6 mcg/kg/hr  68 kg Intravenous Rate/Dose Change 01/31/17 1538)  sodium chloride 0.9 % bolus 1,000 mL (0 mLs Intravenous Stopped 01/31/17 0936)  ondansetron (ZOFRAN) injection 4 mg (4 mg Intravenous Given 01/31/17 0823)  famotidine (PEPCID) IVPB 20 mg premix (0 mg Intravenous Stopped 01/31/17 0913)  gi cocktail (Maalox,Lidocaine,Donnatal) (30 mLs Oral Given 01/31/17 0822)  LORazepam (ATIVAN) injection 1 mg (1 mg Intravenous Given 01/31/17 0825)  sodium chloride 0.9 % bolus 1,000 mL (0 mLs Intravenous Stopped 01/31/17 1140)  LORazepam (ATIVAN) injection 1 mg (1 mg Intravenous Given 01/31/17 1010)  LORazepam (ATIVAN) injection 2 mg (2 mg Intravenous Given 01/31/17 1153)  LORazepam (ATIVAN) injection 2 mg (2 mg Intravenous Given 01/31/17 1230)  haloperidol lactate (HALDOL) injection 2 mg (2 mg Intravenous Given 01/31/17 1251)  LORazepam (ATIVAN) injection 2 mg (2 mg Intravenous  Given 01/31/17 1306)  LORazepam (ATIVAN) injection 1 mg (1 mg Intravenous Given 01/31/17 1351)     Initial Impression / Assessment and Plan / ED Course  I have reviewed the triage vital signs and the nursing notes.  Pertinent labs & imaging results that were available during my care of the patient were reviewed by me and considered in my medical decision making (see chart for details).     Patient with history of EtOH abuse here with epigastric pain. He has mild tenderness on examination with no peritoneal findings. On repeat assessment his pain is resolved and he is eating without difficulty. In terms of his epigastric pain this is consistent with alcoholic gastritis. During patient's ED stay he became increasingly tachycardic and tremulous. He was given initial dose of Ativan with no improvement in his symptoms. On repeat assessment he was more agitated with increasing tachycardia and appeared to be responding to internal stimuli. His speech degraded and he will have dysarthric speech was difficult to understand but moving all extremities vigorously. Concerned that patient is developing DTs he was given multiple doses of Ativan and Haldol with no improvement in his symptoms. He was started on a Precedex drip for symptom control. Critical care consulted for further management of his DTs.  Final Clinical Impressions(s) / ED Diagnoses   Final diagnoses:  Acute alcoholic gastritis without hemorrhage  Hyponatremia  Alcohol withdrawal delirium, acute, hyperactive (HCC)    New Prescriptions New Prescriptions   CHLORDIAZEPOXIDE (LIBRIUM) 25 MG CAPSULE    50mg  PO TID x 1D, then 25-50mg  PO BID X 1D, then 25-50mg  PO QD X 1D   FAMOTIDINE (PEPCID) 20 MG TABLET    Take 1 tablet (20 mg total) by mouth 2 (two) times daily.   FOLIC ACID (FOLVITE) 1 MG TABLET    Take 1 tablet (1 mg total) by mouth daily.   ONDANSETRON (ZOFRAN) 4 MG TABLET    Take 1 tablet (4 mg total) by mouth every 6 (six) hours as needed  for nausea or vomiting.   THIAMINE (VITAMIN B-1) 100 MG TABLET    Take 1 tablet (100 mg total) by mouth daily.     Quintella Reichert, MD 01/31/17 [phone]

## 2017-01-31 NOTE — H&P (Signed)
PULMONARY / CRITICAL CARE MEDICINE   Name: [name] MRN: [id number] DOB: 16-Jun-1960    ADMISSION DATE:  01/31/2017 CONSULTATION DATE:  01/31/2017  REFERRING MD:  [name]  CHIEF COMPLAINT:  Delirium Tremens  HISTORY OF PRESENT ILLNESS:   Mr. [name] is a 56 y.o. W M with a history of ETOH and opiate abuse who presented to the ED with a c/o of abdominal pain and N&V. He admitted to heavy ETOH intake 2 d pta. However, his abd pain had significantly remitted such that within 2 hours he was able to eat a snack in the ED without pain or nausea. However, he subsequently became combative and hallucinating. He was dosed with increments of lorazepam (total of 11 mg) and Haldol 2 mg but remained combative and hallucinating. He was then initiated on Precedex with adequate sedation. The use of the latter necessitates ICU admission.  PAST MEDICAL HISTORY :  He  has a past medical history of Alcoholic ([place]); Back pain; Heavy smoker; Hep C w/ coma, chronic ([place]); Loose, teeth; and Pancreatitis.  PAST SURGICAL HISTORY: He  has a past surgical history that includes Knee surgery (2013); Neck surgery (2013); Artery repair (2013); and Microlaryngoscopy (N/A, 07/31/2014).  Allergies  Allergen Reactions  . Penicillins Other (See Comments)    Has patient had a PCN reaction causing immediate rash, facial/tongue/throat swelling, SOB or lightheadedness with hypotension: childhood allergy Has patient had a PCN reaction causing severe rash involving mucus membranes or skin necrosis: childhood allergy Has patient had a PCN reaction that required hospitalization childhood allergy Has patient had a PCN reaction occurring within the last 10 years: childhood allergy If all of the above answers are "NO", then may proceed with Cephalosporin use.      No current facility-administered medications on file prior to encounter.    Current Outpatient Prescriptions on File Prior to Encounter  Medication Sig  .  Aspirin-Acetaminophen-Caffeine (GOODY HEADACHE PO) Take 2 packets by mouth daily as needed. For pain  . loratadine (CLARITIN) 10 MG tablet Take 10 mg by mouth daily.  [name] HYDROcodone-acetaminophen (NORCO/VICODIN) 5-325 MG per tablet Take 1 tablet by mouth every 6 (six) hours as needed for moderate pain. (Patient not taking: Reported on 08/21/2016)    FAMILY HISTORY:  His has no family status information on file.    SOCIAL HISTORY: He  reports that he has been smoking Cigarettes.  He has been smoking about 2.00 packs per day. He has never used smokeless tobacco. He reports that he drinks alcohol. He reports that he does not use drugs.  REVIEW OF SYSTEMS:   The patient is currently sedated with Precedex and unable to provide a medical history. However, he was evaluated in the ED 08/21/2016 and the following ROS was obtained:   Constitutional: Negative for fever.  HENT: Negative for congestion.   Respiratory: Negative for shortness of breath.   Cardiovascular: Negative for chest pain.  Gastrointestinal: Positive for abdominal pain. Negative for nausea and vomiting.  Genitourinary: Negative for dysuria and hematuria.  Musculoskeletal: Negative for back pain.  Neurological: Negative for headaches.  Hematological: Does not bruise/bleed easily.  Psychiatric/Behavioral: Negative for confusion and suicidal ideas.   SUBJECTIVE:  Sedated  VITAL SIGNS: BP 123/80 (BP Location: Left Arm)   Pulse 78   Temp 98.3 F (36.8 C) (Oral)   Resp 20   Ht 5\' 8"  (1.727 m)   Wt 68 kg (150 lb)   SpO2 99%   BMI 22.81 kg/m   HEMODYNAMICS:  Hemodynamically stable  INTAKE / OUTPUT: No intake/output data recorded.  PHYSICAL EXAMINATION: General:  WD/WN W M sleeping but becomes arousable with stimulation from the physical exam Neuro:  Moves all 4s with 4/4 motor strength HEENT:  [place]/AT; PERRL; TMs clear; unable to examine the oropharynx Cardiovascular:  RRR, No MRG Lungs:  Clear to auscultation Abdomen:   Soft without guarding. Unable to appreciate any organomegaly Musculoskeletal:  No inflamed or joint deformities Skin:  No C,C,E  LABS:  BMET  Recent Labs Lab 01/31/17 0827  NA 125*  K 3.6  CL 88*  CO2 22  BUN 5*  CREATININE 0.87  GLUCOSE 96    Electrolytes  Recent Labs Lab 01/31/17 0827  CALCIUM 9.4    CBC  Recent Labs Lab 01/31/17 0827  WBC 4.5  HGB 11.4*  HCT 36.1*  PLT 337    Coag's No results for input(s): APTT, INR in the last 168 hours.  Sepsis Markers No results for input(s): LATICACIDVEN, PROCALCITON, O2SATVEN in the last 168 hours.  ABG No results for input(s): PHART, PCO2ART, PO2ART in the last 168 hours.  Liver Enzymes  Recent Labs Lab 01/31/17 0827  AST 23  ALT 17  ALKPHOS 74  BILITOT 1.4*  ALBUMIN 3.4*    Cardiac Enzymes No results for input(s): TROPONINI, PROBNP in the last 168 hours.  Glucose No results for input(s): GLUCAP in the last 168 hours.  Imaging Dg Chest 2 View  Result Date: 01/31/2017 CLINICAL DATA:  Abdominal pain.  Pancreatitis. EXAM: CHEST  2 VIEW COMPARISON:  03/08/2012. FINDINGS: The heart is enlarged. There is mild vascular congestion. No frank edema or consolidation. No effusion or pneumothorax. Bones unremarkable. There is no visible free air. Worsening aeration from priors. IMPRESSION: Cardiomegaly with mild vascular congestion. No definite acute findings. Electronically Signed   By: Staci Righter M.D.   On: 01/31/2017 12:21     STUDIES:  CXR: Prominent lung markings. Elevated L hemidiaphragm. No active disease ECG: No acute ST-T changes  CULTURES: None pending  ANTIBIOTICS: None  SIGNIFICANT EVENTS: Past hx pancreatitis but LFTs and lipase are wnl  LINES/TUBES: Periph IV  DISCUSSION: Alcoholic withdrawal vs early DTs. Labs not suggestive of acute intra-abdominal pathology  ASSESSMENT / PLAN:  PULMONARY A: Adequate gas exchange on nasal O2 P:   Titrate as needed  CARDIOVASCULAR A:   No hemodynamic instability common with DTs P:  Maintenance fluids  RENAL A:   Hyponatremia P:   NS with K+ supplement  GASTROINTESTINAL A:   No signs of acute abdomen P:   Follow per exam  HEMATOLOGIC A:   Mild [place] anemia P:  Follow trends based on clinical course  INFECTIOUS A:   No evidence of any acute infections P:   Follow  NEUROLOGIC A:   Altered MS secondary to ETOH withdrawal +/- impending DTs P:   Continue Precedex x 24 hours then see if cn manage with benzos and Haldol.    Pulmonary and [place] Pager: [phone]  01/31/2017, 4:12 PM

## 2017-01-31 NOTE — ED Notes (Signed)
This RN spoke with pt's wife Argyle Gustafson [phone]). Pt's wife is going to try to get a ride to come visit pt.

## 2017-01-31 NOTE — ED Notes (Signed)
Pt still very combative, agitated, requiring staff to help hold him in bed. HR 130's

## 2017-01-31 NOTE — ED Notes (Signed)
Pt continuing to need security to hold him down. Pt uncooperative, kicking, punching. Airway cart outside of room for when  precedex is initiated.

## 2017-01-31 NOTE — ED Notes (Signed)
Intensivist MD at bedside. 

## 2017-01-31 NOTE — Progress Notes (Signed)
[place] Progress Note Patient Name: [name] DOB: Oct 24, 1960 MRN: [id number]   Date of Service  01/31/2017  HPI/Events of Note  Agitation - Request for bilateral wrist restraints and Foley Catheter.   eICU Interventions  Will order: 1. Bilateral soft wrist restraints.  2. Place Foley catheter.      Intervention Category Major Interventions: Delirium, psychosis, severe agitation - evaluation and management  Yaremi Stahlman [place] 01/31/2017, 6:28 PM

## 2017-01-31 NOTE — ED Notes (Addendum)
NT inventoried pt's wallet, money and cell phone. These have been logged and are in security.

## 2017-01-31 NOTE — ED Notes (Signed)
Attempted report x1. 

## 2017-01-31 NOTE — ED Notes (Signed)
Pt waking, trying to get out of bed. NT at bedside.

## 2017-01-31 NOTE — ED Notes (Signed)
Pt increasingly uncooperative. Pt unable to comprehend that he cannot get out of bed. Placed floor mats, fall risk sign, tech at bedside, and chair alarm under pt. Pt required haldol to help him relax and stay in bed.

## 2017-01-31 NOTE — ED Triage Notes (Signed)
Pt here from [place] with abd pain. N/V that started Thursday. Pt has hx of pancreatitis. HR 140. Pain 9/10. BP 154/94, 100% on room air.

## 2017-02-01 DIAGNOSIS — G9340 Encephalopathy, unspecified: Secondary | ICD-10-CM

## 2017-02-01 DIAGNOSIS — D508 Other iron deficiency anemias: Secondary | ICD-10-CM

## 2017-02-01 DIAGNOSIS — F10151 Alcohol abuse with alcohol-induced psychotic disorder with hallucinations: Secondary | ICD-10-CM

## 2017-02-01 DIAGNOSIS — F10231 Alcohol dependence with withdrawal delirium: Principal | ICD-10-CM

## 2017-02-01 LAB — BASIC METABOLIC PANEL
Anion gap: 8 (ref 5–15)
BUN: 7 mg/dL (ref 6–20)
CHLORIDE: 106 mmol/L (ref 101–111)
CO2: 22 mmol/L (ref 22–32)
CREATININE: 0.75 mg/dL (ref 0.61–1.24)
Calcium: 8.6 mg/dL — ABNORMAL LOW (ref 8.9–10.3)
GFR calc Af Amer: >60 mL/min (ref >60)
GFR calc non Af Amer: >60 mL/min (ref >60)
Glucose, Bld: 99 mg/dL (ref 65–99)
Potassium: 4.1 mmol/L (ref 3.5–5.1)
SODIUM: 136 mmol/L (ref 135–145)

## 2017-02-01 LAB — HIV ANTIBODY (ROUTINE TESTING W REFLEX): HIV Screen 4th Generation wRfx: NONREACTIVE

## 2017-02-01 LAB — GLUCOSE, CAPILLARY: GLUCOSE-CAPILLARY: 132 mg/dL — AB (ref 65–99)

## 2017-02-01 LAB — MAGNESIUM: Magnesium: 1.9 mg/dL (ref 1.7–2.4)

## 2017-02-01 MED ORDER — VITAMIN B-1 100 MG PO TABS
100.0000 mg | ORAL_TABLET | Freq: Every day | ORAL | Status: DC
  Administered 2017-02-02 – 2017-02-03 (×2): 100 mg via ORAL
  Filled 2017-02-01 (×4): qty 1

## 2017-02-01 MED ORDER — LORAZEPAM 2 MG/ML IJ SOLN
1.0000 mg | INTRAMUSCULAR | Status: DC | PRN
  Administered 2017-02-01: 2 mg via INTRAVENOUS
  Filled 2017-02-01: qty 1

## 2017-02-01 MED ORDER — NICOTINE 21 MG/24HR TD PT24
21.0000 mg | MEDICATED_PATCH | Freq: Every day | TRANSDERMAL | Status: DC
  Administered 2017-02-01 – 2017-02-03 (×4): 21 mg via TRANSDERMAL
  Filled 2017-02-01 (×5): qty 1

## 2017-02-01 MED ORDER — FOLIC ACID 5 MG/ML IJ SOLN
1.0000 mg | Freq: Every day | INTRAMUSCULAR | Status: DC
  Filled 2017-02-01: qty 0.2

## 2017-02-01 MED ORDER — ADULT MULTIVITAMIN W/MINERALS CH
1.0000 | ORAL_TABLET | Freq: Every day | ORAL | Status: DC
  Filled 2017-02-01: qty 1

## 2017-02-01 MED ORDER — VITAMIN B-1 100 MG PO TABS
100.0000 mg | ORAL_TABLET | Freq: Every day | ORAL | Status: DC
  Filled 2017-02-01: qty 1

## 2017-02-01 MED ORDER — FOLIC ACID 1 MG PO TABS
1.0000 mg | ORAL_TABLET | Freq: Every day | ORAL | Status: DC
  Filled 2017-02-01: qty 1

## 2017-02-01 MED ORDER — ADULT MULTIVITAMIN W/MINERALS CH
1.0000 | ORAL_TABLET | Freq: Every day | ORAL | Status: DC
  Administered 2017-02-01 – 2017-02-03 (×3): 1 via ORAL
  Filled 2017-02-01 (×3): qty 1

## 2017-02-01 MED ORDER — KCL IN DEXTROSE-NACL 40-5-0.45 MEQ/L-%-% IV SOLN
INTRAVENOUS | Status: DC
  Filled 2017-02-01: qty 1000

## 2017-02-01 MED ORDER — FOLIC ACID 1 MG PO TABS
1.0000 mg | ORAL_TABLET | Freq: Every day | ORAL | Status: DC
  Administered 2017-02-01 – 2017-02-03 (×3): 1 mg via ORAL
  Filled 2017-02-01 (×3): qty 1

## 2017-02-01 MED ORDER — CLONIDINE HCL 0.1 MG PO TABS
0.1000 mg | ORAL_TABLET | Freq: Two times a day (BID) | ORAL | Status: DC
  Administered 2017-02-01 (×2): 0.1 mg via ORAL
  Filled 2017-02-01 (×3): qty 1

## 2017-02-01 MED ORDER — PNEUMOCOCCAL VAC POLYVALENT 25 MCG/0.5ML IJ INJ
0.5000 mL | INJECTION | INTRAMUSCULAR | Status: DC
  Filled 2017-02-01: qty 0.5

## 2017-02-01 MED ORDER — LORAZEPAM 2 MG/ML IJ SOLN
2.0000 mg | INTRAMUSCULAR | Status: DC | PRN
  Administered 2017-02-03 (×3): 2 mg via INTRAVENOUS
  Filled 2017-02-01 (×5): qty 1

## 2017-02-01 NOTE — Progress Notes (Signed)
PULMONARY / CRITICAL CARE MEDICINE   Name: [name] MRN: [id number] DOB: 1960/08/13    ADMISSION DATE:  01/31/2017 CONSULTATION DATE:  01/31/2017  REFERRING MD:  [name]  CHIEF COMPLAINT:  Delirium Tremens  HISTORY OF PRESENT ILLNESS:   [name] is a 56 y.o. W M with a history of ETOH and opiate abuse who presented to the ED with a c/o of abdominal pain and N&V. He admitted to heavy ETOH intake 2 d pta. However, his abd pain had significantly remitted such that within 2 hours he was able to eat a snack in the ED without pain or nausea. However, he subsequently became combative and hallucinating. He was dosed with increments of lorazepam (total of 11 mg) and Haldol 2 mg but remained combative and hallucinating. He was then initiated on Precedex with adequate sedation. The use of the latter necessitates ICU admission.  SUBJECTIVE:  No distress.  Still confused and requiring re-direction.   VITAL SIGNS: BP (!) 171/86   Pulse (!) 50   Temp (!) 97.4 F (36.3 C) (Oral)   Resp 17   Ht 5\' 8"  (1.727 m)   Wt 151 lb 14.4 oz (68.9 kg)   SpO2 99%   BMI 23.10 kg/m   HEMODYNAMICS:  Hemodynamically stable  INTAKE / OUTPUT: I/O last 3 completed shifts: In: 1655.7 [P.O.:220; I.V.:1385.7; IV Piggyback:50] Out: 1955 [GHWEX:9371]  PHYSICAL EXAMINATION: General appearance:  56 Year old  Male, well nourished confused,  conversant but we can re-direct him Eyes: anicteric sclerae, moist conjunctivae; PERRL, EOMI bilaterally. Mouth:  membranes and no mucosal ulcerations; Neck: Trachea midline; neck supple, no JVD Lungs/chest: CTA, with normal respiratory effort and no intercostal retractions CV: RRR, no MRGs  Abdomen: Soft, non-tender; no masses or HSM Extremities: No peripheral edema or extremity lymphadenopathy Skin: Normal temperature, turgor and texture; no rash, ulcers or subcutaneous nodules Psych:  Restless affect, alert and oriented to person, but not place and time    LABS:  BMET  Recent Labs Lab 01/31/17 0827 02/01/17 0222  NA 125* 136  K 3.6 4.1  CL 88* 106  CO2 22 22  BUN 5* 7  CREATININE 0.87 0.75  GLUCOSE 96 99    Electrolytes  Recent Labs Lab 01/31/17 0827 02/01/17 0222  CALCIUM 9.4 8.6*  MG  --  1.9    CBC  Recent Labs Lab 01/31/17 0827  WBC 4.5  HGB 11.4*  HCT 36.1*  PLT 337    Coag's No results for input(s): APTT, INR in the last 168 hours.  Sepsis Markers No results for input(s): LATICACIDVEN, PROCALCITON, O2SATVEN in the last 168 hours.  ABG No results for input(s): PHART, PCO2ART, PO2ART in the last 168 hours.  Liver Enzymes  Recent Labs Lab 01/31/17 0827  AST 23  ALT 17  ALKPHOS 74  BILITOT 1.4*  ALBUMIN 3.4*    Cardiac Enzymes No results for input(s): TROPONINI, PROBNP in the last 168 hours.  Glucose  Recent Labs Lab 01/31/17 1814  GLUCAP 96    Imaging Dg Chest 2 View  Result Date: 01/31/2017 CLINICAL DATA:  Abdominal pain.  Pancreatitis. EXAM: CHEST  2 VIEW COMPARISON:  03/08/2012. FINDINGS: The heart is enlarged. There is mild vascular congestion. No frank edema or consolidation. No effusion or pneumothorax. Bones unremarkable. There is no visible free air. Worsening aeration from priors. IMPRESSION: Cardiomegaly with mild vascular congestion. No definite acute findings. Electronically Signed   By: Staci Righter M.D.   On: 01/31/2017 12:21  STUDIES:  CXR: Prominent lung markings. Elevated L hemidiaphragm. No active disease ECG: No acute ST-T changes  CULTURES: None pending  ANTIBIOTICS: None  SIGNIFICANT EVENTS: Past hx pancreatitis but LFTs and lipase are wnl  LINES/TUBES: Periph IV  DISCUSSION: Alcoholic withdrawal vs early DTs. Labs not suggestive of acute intra-abdominal pathology -> still w/ DTs. Continue supportive care   ASSESSMENT / PLAN:  Altered MS secondary to ETOH withdrawal +/- impending DTs Plan:   Cont precedex gtt Cont PRN ativan  Add low  dose clonidine Cont supportive care in ICU   Mild [place] anemia Plan:  [place] ACNP-BC [place] Pager # [phone] OR # [phone] if no answer  Attending Note:  56 year old male with etoh abuse history who presents to PCCM with DTs requiring precedex drip.  On exam, remains confused and in DTs with coarse BS on exam.  I reviewed CXR myself, no acute disease.  Will hold in the ICU for precedex drip.   PRN ativan.  Start clonidine as a sparing agent for precedex.  Thiamine and folate.  CBC in AM for anemia.  Hold off transfusion for now.  Hold in the ICU given level of agitation.  The patient is critically ill with multiple organ systems failure and requires high complexity decision making for assessment and support, frequent evaluation and titration of therapies, application of advanced monitoring technologies and extensive interpretation of multiple databases.   Critical Care Time devoted to patient care services described in this note is  35  Minutes. This time reflects time of care of this signee Dr [name]. This critical care time does not reflect procedure time, or teaching time or supervisory time of PA/NP/Med student/Med Resident etc but could involve care discussion time.  [name], M.D. [facility] Pulmonary/Critical Care Medicine. Pager: [phone]. After hours pager: [phone].  02/01/2017, 10:36 AM

## 2017-02-01 NOTE — Progress Notes (Signed)
eLink Physician-Brief Progress Note Patient Name: [name] DOB: 1961/06/04 MRN: [id number]   Date of Service  02/01/2017  HPI/Events of Note  Smoker, requests nicotine patch  eICU Interventions  Rx nicotine patch     Intervention Category Minor Interventions: Routine modifications to care plan (e.g. PRN medications for pain, fever)  Simonne Maffucci 02/01/2017, 7:45 PM

## 2017-02-01 NOTE — Progress Notes (Signed)
Mildly agitated on percedex 0.7 Mildly hypertensiw with HR 70  Plan - advised RN to increase percedex - added ativan prn  Dr. Brand Males, M.D., [facility].C.P Pulmonary and Critical Care Medicine Staff Physician [place] Pulmonary and Critical Care Pager: [phone], If no answer or between  15:00h - 7:00h: call [phone]  02/01/2017 3:08 AM

## 2017-02-02 DIAGNOSIS — D649 Anemia, unspecified: Secondary | ICD-10-CM

## 2017-02-02 DIAGNOSIS — I10 Essential (primary) hypertension: Secondary | ICD-10-CM

## 2017-02-02 LAB — BASIC METABOLIC PANEL
Anion gap: 9 (ref 5–15)
BUN: 10 mg/dL (ref 6–20)
CHLORIDE: 96 mmol/L — AB (ref 101–111)
CO2: 25 mmol/L (ref 22–32)
Calcium: 8.5 mg/dL — ABNORMAL LOW (ref 8.9–10.3)
Creatinine, Ser: 0.81 mg/dL (ref 0.61–1.24)
GFR calc non Af Amer: >60 mL/min (ref >60)
Glucose, Bld: 103 mg/dL — ABNORMAL HIGH (ref 65–99)
POTASSIUM: 3.7 mmol/L (ref 3.5–5.1)
SODIUM: 130 mmol/L — AB (ref 135–145)

## 2017-02-02 LAB — CBC
HEMATOCRIT: 30.7 % — AB (ref 39.0–52.0)
Hemoglobin: 9.4 g/dL — ABNORMAL LOW (ref 13.0–17.0)
MCH: 24.7 pg — ABNORMAL LOW (ref 26.0–34.0)
MCHC: 30.6 g/dL (ref 30.0–36.0)
MCV: 80.6 fL (ref 78.0–100.0)
Platelets: 280 10*3/uL (ref 150–400)
RBC: 3.81 MIL/uL — AB (ref 4.22–5.81)
RDW: 17.4 % — ABNORMAL HIGH (ref 11.5–15.5)
WBC: 7.2 10*3/uL (ref 4.0–10.5)

## 2017-02-02 LAB — MAGNESIUM: Magnesium: 1.7 mg/dL (ref 1.7–2.4)

## 2017-02-02 MED ORDER — METOPROLOL TARTRATE 50 MG PO TABS
50.0000 mg | ORAL_TABLET | Freq: Two times a day (BID) | ORAL | Status: DC
  Administered 2017-02-02 – 2017-02-03 (×3): 50 mg via ORAL
  Filled 2017-02-02: qty 4
  Filled 2017-02-02 (×3): qty 1

## 2017-02-02 MED ORDER — GI COCKTAIL ~~LOC~~
30.0000 mL | Freq: Once | ORAL | Status: AC
  Administered 2017-02-02: 30 mL via ORAL
  Filled 2017-02-02: qty 30

## 2017-02-02 MED ORDER — IBUPROFEN 600 MG PO TABS
600.0000 mg | ORAL_TABLET | Freq: Four times a day (QID) | ORAL | Status: DC | PRN
  Administered 2017-02-02 – 2017-02-03 (×4): 600 mg via ORAL
  Filled 2017-02-02 (×2): qty 1
  Filled 2017-02-02: qty 3
  Filled 2017-02-02: qty 1

## 2017-02-02 NOTE — Progress Notes (Signed)
   C.io bloating and abd discomfort  Plan Gi cocktail x 1  Dr. Brand Males, M.D., [place].C.P Pulmonary and Critical Care Medicine Staff Physician [place] Pulmonary and Critical Care Pager: [phone], If no answer or between  15:00h - 7:00h: call [phone]  02/02/2017 5:25 AM

## 2017-02-02 NOTE — Clinical Social Work Note (Signed)
Clinical Social Work Assessment  Patient Details  Name: [name] MRN: [id number] Date of Birth: 04-11-1961  Date of referral:  02/02/17               Reason for consult:  Substance Use/ETOH Abuse                Permission sought to share information with:  Family Supports Permission granted to share information::  Yes, Verbal Permission Granted  Name::     [name]   Agency::     Relationship::     Contact Information:     Housing/Transportation Living arrangements for the past 2 months:  Single Family Home (with wife. ) Source of Information:  Patient Patient Interpreter Needed:  None Criminal Activity/Legal Involvement Pertinent to Current Situation/Hospitalization:  No - Comment as needed Significant Relationships:  Spouse Lives with:  Spouse Do you feel safe going back to the place where you live?  Yes Need for family participation in patient care:  Yes (Comment)  Care giving concerns:  CSW spoke with pt at bedside. During this time pt has no concerns.    Social Worker assessment / plan:  CSW spoke with pt at bedside. During this time pt expressed that pt is from home with wife whom pt has been married to for 25 years. Pt informed CSW that she is a primary support for pt at this time as well as throughout. Pt didn't report any other supports to CSW. CSW spoke with pt about ETOH use and pt informed CSW that pt is open to treatment. CSW provided pt with outpatient SA treatment and informed pt that it was pt's choice to seek treatment.   Employment status:  Other (Comment) (unknown ) Insurance information:  Self Pay (Medicaid Pending) PT Recommendations:  Not assessed at this time Information / Referral to community resources:  Outpatient Substance Abuse Treatment Options  Patient/Family's Response to care:  Pt is understanding and agreeable to plan of care at this time.   Patient/Family's Understanding of and Emotional Response to Diagnosis, Current Treatment, and  Prognosis:  No further questions or concerned have been presented to CSW at this time.   Emotional Assessment Appearance:  Appears stated age Attitude/Demeanor/Rapport:    Affect (typically observed):  Appropriate Orientation:  Oriented to Self, Oriented to Place, Oriented to  Time, Oriented to Situation Alcohol / Substance use:  Alcohol Use Psych involvement (Current and /or in the community):  No (Comment)  Discharge Needs  Concerns to be addressed:  No discharge needs identified Readmission within the last 30 days:  No Current discharge risk:  None Barriers to Discharge:  No Barriers Identified   Wetzel Bjornstad, [place] 02/02/2017, 11:24 AM

## 2017-02-02 NOTE — Progress Notes (Addendum)
PULMONARY / CRITICAL CARE MEDICINE   Name: [name] MRN: [id number] DOB: 04/27/1961    ADMISSION DATE:  01/31/2017 CONSULTATION DATE:  01/31/2017  REFERRING MD:  Quintella Reichert  CHIEF COMPLAINT:  Delirium Tremens  HISTORY OF PRESENT ILLNESS:   [name] is a 56 y.o. W M with a history of ETOH and opiate abuse who presented to the ED with a c/o of abdominal pain and N&V. He admitted to heavy ETOH intake 2 d pta. However, his abd pain had significantly remitted such that within 2 hours he was able to eat a snack in the ED without pain or nausea. However, he subsequently became combative and hallucinating. He was dosed with increments of lorazepam (total of 11 mg) and Haldol 2 mg but remained combative and hallucinating. He was then initiated on Precedex with adequate sedation. The use of the latter necessitates ICU admission.  SUBJECTIVE:  Feels better.  Did get one dose of ativan last night  VITAL SIGNS: BP (!) 168/96   Pulse 100   Temp 98.8 F (37.1 C) (Oral)   Resp (!) 24   Ht 5\' 8"  (1.727 m)   Wt 155 lb 13.8 oz (70.7 kg)   SpO2 97%   BMI 23.70 kg/m   HEMODYNAMICS:  Hemodynamically stable  INTAKE / OUTPUT: I/O last 3 completed shifts: In: 1488.8 [I.V.:1438.8; IV Piggyback:50] Out: 4130 [Urine:4130]  PHYSICAL EXAMINATION: General appearance:  56 Year old  Male well nourished NAD conversant  Eyes: anicteric sclerae, moist conjunctivae; PERRL, EOMI bilaterally. Mouth:  membranes and no mucosal ulcerations; normal hard and soft palate Neck: Trachea midline; neck supple, no JVD Lungs/chest: CTA, with normal respiratory effort and no intercostal retractions CV: RRR, no MRGs  Abdomen: Soft, non-tender; no masses or HSM Extremities: No peripheral edema or extremity lymphadenopathy Skin: Normal temperature, turgor and texture; no rash, ulcers or subcutaneous nodules Psych: Appropriate affect, alert and oriented to person, place and time, still a little tremulous    LABS:  BMET  Recent Labs Lab 01/31/17 0827 02/01/17 0222 02/02/17 0404  NA 125* 136 130*  K 3.6 4.1 3.7  CL 88* 106 96*  CO2 22 22 25   BUN 5* 7 10  CREATININE 0.87 0.75 0.81  GLUCOSE 96 99 103*    Electrolytes  Recent Labs Lab 01/31/17 0827 02/01/17 0222 02/02/17 0404  CALCIUM 9.4 8.6* 8.5*  MG  --  1.9 1.7    CBC  Recent Labs Lab 01/31/17 0827 02/02/17 0404  WBC 4.5 7.2  HGB 11.4* 9.4*  HCT 36.1* 30.7*  PLT 337 280    Coag's No results for input(s): APTT, INR in the last 168 hours.  Sepsis Markers No results for input(s): LATICACIDVEN, PROCALCITON, O2SATVEN in the last 168 hours.  ABG No results for input(s): PHART, PCO2ART, PO2ART in the last 168 hours.  Liver Enzymes  Recent Labs Lab 01/31/17 0827  AST 23  ALT 17  ALKPHOS 74  BILITOT 1.4*  ALBUMIN 3.4*    Cardiac Enzymes No results for input(s): TROPONINI, PROBNP in the last 168 hours.  Glucose  Recent Labs Lab 01/31/17 1814 02/01/17 2156  GLUCAP 96 132*    Imaging No results found.   STUDIES:  CXR: Prominent lung markings. Elevated L hemidiaphragm. No active disease ECG: No acute ST-T changes  CULTURES: None pending  ANTIBIOTICS: None  SIGNIFICANT EVENTS: Past hx pancreatitis but LFTs and lipase are wnl  LINES/TUBES: Periph IV  DISCUSSION: Alcoholic withdrawal vs early DTs. Labs not suggestive of acute  intra-abdominal pathology -> still w/ DTs. Continue supportive care   ASSESSMENT / PLAN:  Altered MS secondary to ETOH withdrawal +/- impending DTs Plan:   Dc precedex from [facility] CIWA protocol  Social work to assist w/ AA  To IM service   HTN/tachycardia Plan Add low dose bb  Mild [place] anemia Plan:  [place] [place] ACNP-BC [place] Pager # [phone] OR # [phone] if no answer  Attending Note:  56 year old male with history of etoh abuse presenting with etoh withdrawal requiring precedex.  Patient is now off  precedex and seems more appropriate.  Lungs are clear.  I reviewed the CXR, no acute disease noted.  Discussed with PCCM-NP.  Acute encephalopathy:  - D/C precedex  Alcohol withdrawal:  - D/C precedex  - Ativan  - CIWA protocol  HTN and Tachycardia:  - Beta blocker with holding parameters  Anemia:  - CBC in AM  - Transfuse per ICU protocol  Transfer to SDU and to IMTS and PCCM off 8/29.  Patient seen and examined, agree with above note.  I dictated the care and orders written for this patient under my direction.  William Farmer, MD [phone]  02/02/2017, 10:17 AM

## 2017-02-02 NOTE — Progress Notes (Signed)
Discussed POC with Kary Kos, NP. Notified NP of elevated BP's. & pt c/o decayed tooth pain leading to headache. NP to address and place orders accordingly. Will continue to monitor.

## 2017-02-02 NOTE — Care Management Note (Signed)
Case Management Note  Patient Details  Name: [name] MRN: [id number] Date of Birth: [birth year]  Subjective/Objective:    Pt admitted with ETOH delirium tremors                Action/Plan:   PTA independent from home with wife.  CSW consulted for substance abuse.  Pt in agreement with CM arranging appt with PCP.  Appt secured with [facility] for 9/17 at 9:30 and pt informed that he can go directly to [facility] pharmacy post discharge to medications filled.  Information provided to pt and placed on AVS.  CM will continue to follow for discharge needs    Expected Discharge Date:                  Expected Discharge Plan:  Home/Self Care  In-House Referral:  Clinical Social Work  Discharge planning Services  CM Consult  Post Acute Care Choice:    Choice offered to:     DME Arranged:    DME Agency:     HH Arranged:    [place] Agency:     Status of Service:     If discussed at [employer], dates discussed:    Additional Comments:  Maryclare Labrador, RN 02/02/2017, 11:08 AM

## 2017-02-02 NOTE — Progress Notes (Signed)
Telephone report called and given to Ulice Dash, Therapist, sports. SBAR reviewed in detail with all questions addressed. Pt informed and aware of transfer. Girlfriend called; left voicemail with update. Transferred to 2W33 in stable condition.

## 2017-02-03 MED ORDER — THIAMINE HCL 100 MG PO TABS: 100.0000 mg | ORAL_TABLET | Freq: Every day | ORAL | 0 refills | Status: DC

## 2017-02-03 MED ORDER — FOLIC ACID 1 MG PO TABS: 1.0000 mg | ORAL_TABLET | Freq: Every day | ORAL | 0 refills | Status: DC

## 2017-02-03 MED ORDER — ADULT MULTIVITAMIN W/MINERALS CH: 1.0000 | ORAL_TABLET | Freq: Every day | ORAL | 0 refills | Status: DC

## 2017-02-03 MED ORDER — METOPROLOL TARTRATE 50 MG PO TABS: 50.0000 mg | ORAL_TABLET | Freq: Two times a day (BID) | ORAL | 0 refills | Status: DC

## 2017-02-03 NOTE — Progress Notes (Signed)
Patient Discharge: Disposition: Patient discharged to home. Education:  Reviewed medications, prescriptions, follow-up appointments and discharge instructions, understood and acknowledged. IV: Discontinued IV before discharge. Telemetry: N/A Transportation: Patient escorted out of the unit in w/c. Belongings: Patient took all his belongings and also the belongings from the security.

## 2017-02-03 NOTE — Discharge Summary (Signed)
Physician Discharge Summary  Patient ID: [name] MRN: [id number] DOB/AGE: 1960-10-10 56 y.o.  Admit date: 01/31/2017 Discharge date: 02/03/2017    Discharge Diagnoses:  Acute Encephalopathy  Chronic Alcohol abuse withdrawal  HTN Anemia                                                                      DISCHARGE PLAN BY DIAGNOSIS     Acute Encephalopathy  Discharge Plan: -Resolved  -Counseling provided on cessation   Chronic Alcohol abuse with withdrawal  Discharge Plan: -Counseling provided on cessation  -Continue Folic acid, Thiamine, Multivitamin Daily   HTN Discharge Plan: -Continue Metoprolol 50 mg BID  Anemia  Discharge Plan: -Follow up with outpatient appointment                      DISCHARGE SUMMARY     56 year old male with PMH of ETOH and opiate abuse presented to ED 8/26 with abdominal pain and nausea/vomitting. Patient admitted to heavy alcohol intake over the last 2 days, however while in the ED became combative with active hallucinations. Required multiple doses of ativan and haldol but ultimately started on Precedex infusion. During hospital stay was weaned off Precedex infusion and mental status improved. On 8/28 was transferred out of ICU to med-surg unit. On 8/29 patient was deemed safe to discharge home.       SIGNIFICANT DIAGNOSTIC STUDIES CXR 8/26 > Cardiomegaly with mild vascular congestion, no acute findings.    Discharge Exam: General: Adult male, no distress  Neuro: Alert, Oriented, follows commands  CV: RRR, no MRG  PULM: Clear breath sounds, non-labored  GI: non-tender, active bowel sounds  Extremities: -edema    Vitals:   02/02/17 1718 02/02/17 2048 02/03/17 0452 02/03/17 0800  BP: (!) 145/85 (!) 165/97 (!) 182/93 (!) 155/77  Pulse:  91 80 80  Resp: 18 18 20 20   Temp: 98.2 F (36.8 C) 98.7 F (37.1 C) 98.3 F (36.8 C) 97.9 F (36.6 C)  TempSrc: Oral   Oral  SpO2: 99% 100% 96% 98%  Weight:   70 kg (154 lb 5.2 oz)    Height:         Discharge Labs  BMET  Recent Labs Lab 01/31/17 0827 02/01/17 0222 02/02/17 0404  NA 125* 136 130*  K 3.6 4.1 3.7  CL 88* 106 96*  CO2 22 22 25   GLUCOSE 96 99 103*  BUN 5* 7 10  CREATININE 0.87 0.75 0.81  CALCIUM 9.4 8.6* 8.5*  MG  --  1.9 1.7    CBC  Recent Labs Lab 01/31/17 0827 02/02/17 0404  HGB 11.4* 9.4*  HCT 36.1* 30.7*  WBC 4.5 7.2  PLT 337 280    Anti-Coagulation No results for input(s): INR in the last 168 hours.  Discharge Instructions    Diet - low sodium heart healthy    Complete by:  As directed    Increase activity slowly    Complete by:  As directed        Follow-up Information    [facility] Follow up.   Specialty:  Emergency Medicine Why:  If symptoms worsen Contact information: [address] [id number] [place]  [place]       Call [facility].   Why:  Please utilize pharmacy at clinic and request medication assistance Contact information: [place] [address] [phone]       [facility] Follow up on 02/22/2017.   Why:  at 1:30 Contact information: [place] [address]           Allergies as of 02/03/2017      Reactions   Penicillins Other (See Comments)   Has patient had a PCN reaction causing immediate rash, facial/tongue/throat swelling, SOB or lightheadedness with hypotension: childhood allergy Has patient had a PCN reaction causing severe rash involving mucus membranes or skin necrosis: childhood allergy Has patient had a PCN reaction that required hospitalization childhood allergy Has patient had a PCN reaction occurring within the last 10 years: childhood allergy If all of the above answers are "NO", then may proceed with Cephalosporin use.      Medication List    STOP taking these medications    GOODY HEADACHE PO   HYDROcodone-acetaminophen 5-325 MG tablet Commonly known as:  NORCO/VICODIN     TAKE these medications   acetaminophen 500 MG tablet Commonly known as:  TYLENOL Take 1,000 mg by mouth every 6 (six) hours as needed for mild pain.   famotidine 20 MG tablet Commonly known as:  PEPCID Take 1 tablet (20 mg total) by mouth 2 (two) times daily.   folic acid 1 MG tablet Commonly known as:  FOLVITE Take 1 tablet (1 mg total) by mouth daily.   loratadine 10 MG tablet Commonly known as:  CLARITIN Take 10 mg by mouth daily.   metoprolol tartrate 50 MG tablet Commonly known as:  LOPRESSOR Take 1 tablet (50 mg total) by mouth 2 (two) times daily.   multivitamin with minerals Tabs tablet Take 1 tablet by mouth daily.   thiamine 100 MG tablet Take 1 tablet (100 mg total) by mouth daily.            Discharge Care Instructions        Start     Ordered   02/04/17 0000  Multiple Vitamin (MULTIVITAMIN WITH MINERALS) TABS tablet  Daily     02/03/17 1109   94/70/96 2836  folic acid (FOLVITE) 1 MG tablet  Daily     02/03/17 1143   02/04/17 0000  thiamine 100 MG tablet  Daily     02/03/17 1143   02/03/17 0000  metoprolol tartrate (LOPRESSOR) 50 MG tablet  2 times daily     02/03/17 1109   02/03/17 0000  Increase activity slowly     02/03/17 1109   02/03/17 0000  Diet - low sodium heart healthy     02/03/17 1109   01/31/17 0000  famotidine (PEPCID) 20 MG tablet  2 times daily     01/31/17 6294        Disposition: Patient deemed safe to discharge home with follow up appointment with primary care physician.   Discharged Condition: [name] has met maximum benefit of inpatient care and is medically stable and cleared for discharge.  Patient is pending follow up as above.      Time spent on disposition:  45 Minutes.   Signed: Hayden Pedro, AGACNP-BC [facility]  Pgr: [phone]  PCCM Pgr: [phone]

## 2017-02-03 NOTE — Care Management Note (Signed)
Case Management Note  Patient Details  Name: Javante Nilsson MRN: [id number] Date of Birth: 03-11-1961  Subjective/Objective:                 Patient admitted w ETOH delerium. F/U apt made at [facility] patient care center [facility]), on AVS. Patient can fill Rx at [facility] after DC. Provided w maps/ brochures to facilities.   Action/Plan:   Expected Discharge Date:  02/03/17               Expected Discharge Plan:  Home/Self Care  In-House Referral:  Clinical Social Work  Discharge planning Services  CM Consult, Follow-up appt scheduled, Medication Assistance  Post Acute Care Choice:    Choice offered to:     DME Arranged:    DME Agency:     HH Arranged:    [place] Agency:     Status of Service:  Completed, signed off  If discussed at [employer], dates discussed:    Additional Comments:  Carles Collet, RN 02/03/2017, 11:34 AM

## 2017-02-22 ENCOUNTER — Ambulatory Visit: Payer: Self-pay | Admitting: Family Medicine

## 2017-07-13 ENCOUNTER — Emergency Department (HOSPITAL_COMMUNITY): Payer: Self-pay

## 2017-07-13 ENCOUNTER — Other Ambulatory Visit: Payer: Self-pay

## 2017-07-13 ENCOUNTER — Emergency Department (HOSPITAL_COMMUNITY)
Admission: EM | Admit: 2017-07-13 | Discharge: 2017-07-13 | Disposition: A | Discharge status: Home / Self Care | Payer: Self-pay | Attending: Physician Assistant | Admitting: Physician Assistant

## 2017-07-13 DIAGNOSIS — M25552 Pain in left hip: Secondary | ICD-10-CM | POA: Insufficient documentation

## 2017-07-13 DIAGNOSIS — W11XXXA Fall on and from ladder, initial encounter: Secondary | ICD-10-CM | POA: Insufficient documentation

## 2017-07-13 DIAGNOSIS — Z79899 Other long term (current) drug therapy: Secondary | ICD-10-CM | POA: Insufficient documentation

## 2017-07-13 DIAGNOSIS — W19XXXA Unspecified fall, initial encounter: Secondary | ICD-10-CM

## 2017-07-13 DIAGNOSIS — F17210 Nicotine dependence, cigarettes, uncomplicated: Secondary | ICD-10-CM | POA: Insufficient documentation

## 2017-07-13 MED ORDER — TRAMADOL HCL 50 MG PO TABS: 50.0000 mg | ORAL_TABLET | Freq: Four times a day (QID) | ORAL | 0 refills | Status: DC | PRN

## 2017-07-13 NOTE — ED Provider Notes (Signed)
[facility] EMERGENCY DEPARTMENT Provider Note   CSN: [id number] Arrival date & time: 07/13/17  1008     History   Chief Complaint Chief Complaint  Patient presents with  . Fall    HPI [name] is a 57 y.o. male.  HPI  57 year old male presents status post fall.  Patient reports that he was standing on a ladder when he lost balance approximately 6 days ago.  Patient no he did hit his head lost consciousness and fell on his left hip.  He notes since then he has had left lateral hip pain worse with ambulation with radiation of pain down into the thigh.  Patient notes he is able to ambulate but has difficulty.  Patient denies any significant neck pain, back pain, or distal neurological deficits.  He denies any abdominal pain, notes he has had normal urination and bowel movements.  Patient notes that he continues to drink alcohol intake [place] powders for the pain.  Patient denies any neurological deficits, headaches, or any other injuries other than the left hip pain.   Past Medical History:  Diagnosis Date  . Alcoholic ([place])   . Back pain   . Heavy smoker   . Hep C w/ coma, chronic ([place])   . Loose, teeth   . Pancreatitis     Patient Active Problem List   Diagnosis Date Noted  . 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 ARTERY REPAIR  2013   neck  . KNEE SURGERY  2013   rt  . MICROLARYNGOSCOPY N/A 07/31/2014   Procedure: MICROLARYNGOSCOPY WITH BIOPSY ;  Surgeon: Rozetta Nunnery, MD;  Location: [facility];  Service: ENT;  Laterality: N/A;  . NECK SURGERY  2013   cervical fusion-       Home Medications    Prior to Admission medications   Medication Sig Start Date End Date Taking? Authorizing Provider  acetaminophen (TYLENOL) 500 MG tablet Take 1,000 mg by mouth every 6 (six) hours as needed for mild pain.    [provider]  famotidine (PEPCID) 20 MG tablet Take 1 tablet (20 mg total) by mouth 2 (two) times daily. 01/31/17   Quintella Reichert, MD  folic acid (FOLVITE) 1 MG tablet Take 1 tablet (1 mg total) by mouth daily. 02/04/17   Omar Person, NP  loratadine (CLARITIN) 10 MG tablet Take 10 mg by mouth daily.    [provider]  metoprolol tartrate (LOPRESSOR) 50 MG tablet Take 1 tablet (50 mg total) by mouth 2 (two) times daily. 02/03/17   Omar Person, NP  Multiple Vitamin (MULTIVITAMIN WITH MINERALS) TABS tablet Take 1 tablet by mouth daily. 02/04/17   Omar Person, NP  thiamine 100 MG tablet Take 1 tablet (100 mg total) by mouth daily. 02/04/17   Omar Person, NP  traMADol (ULTRAM) 50 MG tablet Take 1 tablet (50 mg total) by mouth every 6 (six) hours as needed. 07/13/17   Okey Regal, PA-C    Family History No family history on file.  Social History Social History   Tobacco Use  . Smoking status: Current Every Day Smoker    Packs/day: 2.00    Types: Cigarettes  . Smokeless tobacco: Never Used  Substance Use Topics  . Alcohol use: Yes    Comment: alcoholic (4 83'T a day). 12 pck a day  . Drug use: No  Allergies   Penicillins   Review of Systems Review of Systems  All other systems reviewed and are negative.    Physical Exam Updated Vital Signs BP 128/75   Pulse (!) 108   Temp 97.6 F (36.4 C) (Oral)   Resp 16   SpO2 96%   Physical Exam  Constitutional: He is oriented to person, place, and time. He appears well-developed and well-nourished.  HENT:  Head: Normocephalic and atraumatic.  Eyes: Conjunctivae are normal. Pupils are equal, round, and reactive to light. Right eye exhibits no discharge. Left eye exhibits no discharge. No scleral icterus.  Neck: Normal range of motion. No JVD present. No tracheal deviation present.  Pulmonary/Chest: Effort normal. No stridor.  Abdominal: Soft. He exhibits no distension and no mass. There  is no tenderness. There is no rebound and no guarding. No hernia.  Musculoskeletal:  Left hip atraumatic, tenderness palpation of the lateral aspect, pain with all range of motion but full active range of motion, sensation intact, strength 5 out of 5  No CT or L-spine tenderness  Neurological: He is alert and oriented to person, place, and time. No sensory deficit. He exhibits normal muscle tone. Coordination normal.  Psychiatric: He has a normal mood and affect. His behavior is normal. Judgment and thought content normal.  Nursing note and vitals reviewed.    ED Treatments / Results  Labs (all labs ordered are listed, but only abnormal results are displayed) Labs Reviewed - No data to display  EKG  EKG Interpretation None       Radiology Dg Hip Unilat With Pelvis 2-3 Views Left  Result Date: 07/13/2017 CLINICAL DATA:  Fall off ladder.  Left posterior hip pain. EXAM: DG HIP (WITH OR WITHOUT PELVIS) 2-3V LEFT COMPARISON:  None. FINDINGS: There is no evidence of hip fracture or dislocation. Chronic fracture involving the left inferior pubic rami noted. There is no evidence of arthropathy or other focal bone abnormality. IMPRESSION: Negative. Electronically Signed   By: Kerby Moors M.D.   On: 07/13/2017 11:04    Procedures Procedures (including critical care time)  Medications Ordered in ED Medications - No data to display   Initial Impression / Assessment and Plan / ED Course  I have reviewed the triage vital signs and the nursing notes.  Pertinent labs & imaging results that were available during my care of the patient were reviewed by me and considered in my medical decision making (see chart for details).      Final Clinical Impressions(s) / ED Diagnoses   Final diagnoses:  Fall, initial encounter  Pain of left hip joint    Labs:   Imaging: Hip left  Consults:  Therapeutics:  Discharge Meds: Ultram  Assessment/Plan: 57 year old male presents status post  fall.  Patient with left lateral hip pain.  He is well-appearing in no acute distress.  No signs of trauma on physical exam, he does have pain with range of motion of the hip, he can ambulate with mild antalgic gait.  I have low suspicion for occult fracture.  Plain films reassuring with no acute signs of fracture.  Patient also hit his head, but has no neurological deficits no headache, no neck pain or back pain.  Patient will be given a short course of Ultram, he is encouraged to follow-up closely with orthopedic surgery, return immediately with any new or worsening signs or symptoms.  He verbalized understanding and agreement to today's plan had no further questions or concerns.    ED  Discharge Orders        Ordered    traMADol (ULTRAM) 50 MG tablet  Every 6 hours PRN     07/13/17 1422          Okey Regal, PA-C 07/13/17 1423    [place], Fredia Sorrow, MD 07/15/17 1500

## 2017-07-13 NOTE — Discharge Instructions (Signed)
Please read attached information. If you experience any new or worsening signs or symptoms please return to the emergency room for evaluation. Please follow-up with your primary care provider or specialist as discussed. Please use medication prescribed only as directed and discontinue taking if you have any concerning signs or symptoms.   °

## 2017-07-13 NOTE — ED Triage Notes (Signed)
Pt states he fell about 6 ft off a ladder last week onto left side and did hit his head states he had a brief LOC. Pt now has left hip pain worse with walking.

## 2017-11-09 ENCOUNTER — Ambulatory Visit (INDEPENDENT_AMBULATORY_CARE_PROVIDER_SITE_OTHER): Payer: Medicaid Other | Admitting: Orthopaedic Surgery

## 2017-11-09 ENCOUNTER — Encounter (INDEPENDENT_AMBULATORY_CARE_PROVIDER_SITE_OTHER): Payer: Self-pay | Admitting: Orthopaedic Surgery

## 2017-11-09 DIAGNOSIS — M5432 Sciatica, left side: Secondary | ICD-10-CM

## 2017-11-09 MED ORDER — METHOCARBAMOL 500 MG PO TABS: 500.0000 mg | ORAL_TABLET | Freq: Four times a day (QID) | ORAL | 2 refills | Status: DC | PRN

## 2017-11-09 MED ORDER — PREDNISONE 10 MG (21) PO TBPK
ORAL_TABLET | ORAL | 0 refills | Status: DC
Start: 2017-11-09 — End: 2017-11-17

## 2017-11-09 MED ORDER — IBUPROFEN 800 MG PO TABS: 800.0000 mg | ORAL_TABLET | Freq: Three times a day (TID) | ORAL | 2 refills | Status: DC | PRN

## 2017-11-09 NOTE — Progress Notes (Signed)
Office Visit Note   Patient: [name]           Date of Birth: 09-02-1960           MRN: [id number] Visit Date: 11/09/2017              Requested by: No referring provider defined for this encounter. PCP: Default, Provider, MD   Assessment & Plan: Visit Diagnoses:  1. Sciatica, left side     Plan: Impression is lumbar radiculopathy.  We will start with prescription for prednisone, ibuprofen, Robaxin and see if this will settle it down.  He is to follow-up as needed.  If he is no better we need to consider an MRI to rule out structural abnormalities.  Follow-Up Instructions: Return if symptoms worsen or fail to improve.   Orders:  No orders of the defined types were placed in this encounter.  Meds ordered this encounter  Medications  . predniSONE (STERAPRED UNI-PAK 21 TAB) 10 MG (21) TBPK tablet    Sig: Take as directed    Dispense:  21 tablet    Refill:  0  . ibuprofen (ADVIL,MOTRIN) 800 MG tablet    Sig: Take 1 tablet (800 mg total) by mouth every 8 (eight) hours as needed.    Dispense:  30 tablet    Refill:  2  . methocarbamol (ROBAXIN) 500 MG tablet    Sig: Take 1 tablet (500 mg total) by mouth every 6 (six) hours as needed for muscle spasms.    Dispense:  30 tablet    Refill:  2      Procedures: No procedures performed   Clinical Data: No additional findings.   Subjective: Chief Complaint  Patient presents with  . Left Hip - Pain    [name] is a 57 year old gentleman who comes in with left hip pain that radiates down his leg.  He denies any groin pain.  He feels like the pain is getting worse.  He takes [place] powder with partial relief.  A denies any numbness and tingling.   Review of Systems  Constitutional: Negative.   All other systems reviewed and are negative.    Objective: Vital Signs: There were no vitals taken for this visit.  Physical Exam  Constitutional: He is oriented to person, place, and time. He appears well-developed and  well-nourished.  HENT:  Head: Normocephalic and atraumatic.  Eyes: Pupils are equal, round, and reactive to light.  Neck: Neck supple.  Pulmonary/Chest: Effort normal.  Abdominal: Soft.  Musculoskeletal: Normal range of motion.  Neurological: He is alert and oriented to person, place, and time.  Skin: Skin is warm.  Psychiatric: He has a normal mood and affect. His behavior is normal. Judgment and thought content normal.  Nursing note and vitals reviewed.   Ortho Exam Left lower extremity exam shows no focal motor or sensory deficits.  Positive sciatic tension sign.  Painless range of motion of the hip. Specialty Comments:  No specialty comments available.  Imaging: No results found.   PMFS History: Patient Active Problem List   Diagnosis Date Noted  . 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 Alcoholic ([place])   . Back pain   . Heavy smoker   . Hep C w/ coma, chronic ([place])   . Loose, teeth   . Pancreatitis     History reviewed. No pertinent family history.  Past Surgical History:  Procedure Laterality Date  .  ARTERY REPAIR  2013   neck  . KNEE SURGERY  2013   rt  . MICROLARYNGOSCOPY N/A 07/31/2014   Procedure: MICROLARYNGOSCOPY WITH BIOPSY ;  Surgeon: Rozetta Nunnery, MD;  Location: [facility];  Service: ENT;  Laterality: N/A;  . NECK SURGERY  2013   cervical fusion-   Social History   Occupational History  . Not on file  Tobacco Use  . Smoking status: Current Every Day Smoker    Packs/day: 2.00    Types: Cigarettes  . Smokeless tobacco: Never Used  Substance and Sexual Activity  . Alcohol use: Yes    Comment: alcoholic (4 96'P a day). 12 pck a day  . Drug use: No  . Sexual activity: Not on file

## 2017-11-16 ENCOUNTER — Other Ambulatory Visit (INDEPENDENT_AMBULATORY_CARE_PROVIDER_SITE_OTHER): Payer: Self-pay

## 2017-11-16 ENCOUNTER — Telehealth (INDEPENDENT_AMBULATORY_CARE_PROVIDER_SITE_OTHER): Payer: Self-pay | Admitting: *Deleted

## 2017-11-16 DIAGNOSIS — G8929 Other chronic pain: Secondary | ICD-10-CM

## 2017-11-16 DIAGNOSIS — M545 Low back pain: Principal | ICD-10-CM

## 2017-11-16 NOTE — Telephone Encounter (Signed)
Ok go ahead and order MRI L spine

## 2017-11-16 NOTE — Telephone Encounter (Signed)
Pt wife called Triage stating pt is still in pain, he has taken the prednisone, muscle relaxer and pain went away, now he is out and pain is worse and can barely walk. Wants to know if you can prescribe him some more steroid if not would like to proceed with MRI that was recommended. Please advise.   Pt pharmacy is [facility]

## 2017-11-16 NOTE — Telephone Encounter (Signed)
Please advise 

## 2017-11-16 NOTE — Telephone Encounter (Signed)
MRI ORDER MADE 

## 2017-11-17 ENCOUNTER — Other Ambulatory Visit (INDEPENDENT_AMBULATORY_CARE_PROVIDER_SITE_OTHER): Payer: Self-pay

## 2017-11-17 MED ORDER — PREDNISONE 10 MG (21) PO TBPK: ORAL_TABLET | ORAL | 0 refills | Status: DC

## 2017-11-17 NOTE — Telephone Encounter (Signed)
Please advise 

## 2017-11-17 NOTE — Telephone Encounter (Signed)
I spoke with the patient's wife and advised her that the MRI has been ordered and what the process is, she wanted to know if Dr. Erlinda Hong would prescribe him some more steroids.  [phone].  Thank you.

## 2017-11-17 NOTE — Telephone Encounter (Signed)
Yes sterapred

## 2017-11-17 NOTE — Telephone Encounter (Signed)
Called into pharm. Called wife back no answer LMOM

## 2017-11-18 ENCOUNTER — Telehealth (INDEPENDENT_AMBULATORY_CARE_PROVIDER_SITE_OTHER): Payer: Self-pay | Admitting: Orthopaedic Surgery

## 2017-11-18 ENCOUNTER — Other Ambulatory Visit (INDEPENDENT_AMBULATORY_CARE_PROVIDER_SITE_OTHER): Payer: Self-pay

## 2017-11-18 MED ORDER — IBUPROFEN 800 MG PO TABS: 800.0000 mg | ORAL_TABLET | Freq: Three times a day (TID) | ORAL | 0 refills | Status: DC | PRN

## 2017-11-18 NOTE — Telephone Encounter (Signed)
See message.

## 2017-11-18 NOTE — Telephone Encounter (Signed)
Called into pharm  

## 2017-11-18 NOTE — Telephone Encounter (Signed)
[facility] [id number]  [facility]   Med refill  Ibuprofen (Advil,Mortin)800mg  tablet     Sciatica Left Side  Left Leg pain associated with shooting pain in knee.

## 2017-11-18 NOTE — Telephone Encounter (Signed)
yes

## 2017-11-23 ENCOUNTER — Ambulatory Visit
Admission: RE | Admit: 2017-11-23 | Discharge: 2017-11-23 | Disposition: A | Discharge status: Home / Self Care | Payer: Medicaid Other | Source: Ambulatory Visit | Attending: Orthopaedic Surgery | Admitting: Orthopaedic Surgery

## 2017-11-23 DIAGNOSIS — M545 Low back pain: Principal | ICD-10-CM

## 2017-11-23 DIAGNOSIS — G8929 Other chronic pain: Secondary | ICD-10-CM

## 2017-11-30 ENCOUNTER — Encounter (INDEPENDENT_AMBULATORY_CARE_PROVIDER_SITE_OTHER): Payer: Self-pay | Admitting: Orthopaedic Surgery

## 2017-11-30 ENCOUNTER — Ambulatory Visit (INDEPENDENT_AMBULATORY_CARE_PROVIDER_SITE_OTHER): Payer: Medicaid Other | Admitting: Orthopaedic Surgery

## 2017-11-30 DIAGNOSIS — G8929 Other chronic pain: Secondary | ICD-10-CM | POA: Insufficient documentation

## 2017-11-30 DIAGNOSIS — M5442 Lumbago with sciatica, left side: Secondary | ICD-10-CM

## 2017-11-30 MED ORDER — IBUPROFEN 800 MG PO TABS: 800.0000 mg | ORAL_TABLET | Freq: Three times a day (TID) | ORAL | 2 refills | Status: DC | PRN

## 2017-11-30 MED ORDER — PREDNISONE 10 MG (21) PO TBPK: ORAL_TABLET | ORAL | 0 refills | Status: DC

## 2017-11-30 MED ORDER — GABAPENTIN 100 MG PO CAPS: 100.0000 mg | ORAL_CAPSULE | Freq: Three times a day (TID) | ORAL | 3 refills | Status: DC | PRN

## 2017-11-30 NOTE — Progress Notes (Signed)
      Patient: [name]           Date of Birth: 09-Mar-1961           MRN: [id number] Visit Date: 11/30/2017 PCP: Default, Provider, MD   Assessment & Plan:  Chief Complaint:  Chief Complaint  Patient presents with  . Lower Back - Pain, Follow-up   Visit Diagnoses:  1. Chronic left-sided low back pain with left-sided sciatica     Plan: Patient is a pleasant 57 year old gentleman who presents to our clinic today to discuss MRI results of his lumbar spine.  MRI from 11/23/2017 shows a broad-based disc protrusion bilateral facet spurring resulting in moderate left subarticular stenosis impacting the left L5 nerve root.  He also has broad-based disc protrusions at L4 and 5 and slight mild left subarticular foraminal stenosis at L3-4 with a leftward disc protrusion.  The patient has had a long history of pain and left lower extremity radiculopathy and is failed treatment of prednisone, muscle relaxers and over-the-counter NSAIDs.  At this point, we will refer him to Dr. Ernestina Patches for an epidural steroid injection.  We will also send the patient to formal physical therapy.  In the meantime, we will provide the patient with a Toradol injection to help with the acute pain.  Follow-up with [place] as needed. Total face to face encounter time was greater than 25 minutes and over half of this time was spent in counseling and/or coordination of care.  Follow-Up Instructions: Return if symptoms worsen or fail to improve.   Orders:  No orders of the defined types were placed in this encounter.  No orders of the defined types were placed in this encounter.   Imaging: No results found.  PMFS History: Patient Active Problem List   Diagnosis Date Noted  . Chronic left-sided low back pain with left-sided sciatica 11/30/2017  . 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 Alcoholic ([place])   . Back pain   . Heavy  smoker   . Hep C w/ coma, chronic ([place])   . Loose, teeth   . Pancreatitis     History reviewed. No pertinent family history.  Past Surgical History:  Procedure Laterality Date  . ARTERY REPAIR  2013   neck  . KNEE SURGERY  2013   rt  . MICROLARYNGOSCOPY N/A 07/31/2014   Procedure: MICROLARYNGOSCOPY WITH BIOPSY ;  Surgeon: Rozetta Nunnery, MD;  Location: [facility];  Service: ENT;  Laterality: N/A;  . NECK SURGERY  2013   cervical fusion-   Social History   Occupational History  . Not on file  Tobacco Use  . Smoking status: Current Every Day Smoker    Packs/day: 2.00    Types: Cigarettes  . Smokeless tobacco: Never Used  Substance and Sexual Activity  . Alcohol use: Yes    Comment: alcoholic (4 24'M a day). 12 pck a day  . Drug use: No  . Sexual activity: Not on file

## 2017-12-14 ENCOUNTER — Telehealth (INDEPENDENT_AMBULATORY_CARE_PROVIDER_SITE_OTHER): Payer: Self-pay

## 2017-12-14 ENCOUNTER — Other Ambulatory Visit (INDEPENDENT_AMBULATORY_CARE_PROVIDER_SITE_OTHER): Payer: Self-pay

## 2017-12-14 MED ORDER — PREDNISONE 10 MG (21) PO TBPK: ORAL_TABLET | ORAL | 0 refills | Status: DC

## 2017-12-14 NOTE — Telephone Encounter (Signed)
Patient would like to have a Rx refill on Prednisone sent to [facility] on [address].  Cb#  [phone].  Please advise.  Thank You.

## 2017-12-14 NOTE — Telephone Encounter (Signed)
yes

## 2017-12-14 NOTE — Telephone Encounter (Signed)
Rx sent in to pharm 

## 2017-12-23 ENCOUNTER — Telehealth (INDEPENDENT_AMBULATORY_CARE_PROVIDER_SITE_OTHER): Payer: Self-pay | Admitting: Radiology

## 2017-12-23 NOTE — Telephone Encounter (Signed)
Wife called and LMVM triage that patient is in severe pain and cannot wait until 12/29/17 to have injections.  She is yelling on the message.  She asks can somebody please do something to help this man with his pain.  Left leg is weak and numb and patient is falling a lot.  Requests a call back.  IC patient's wife back and discussed.  I can not get him in any sooner with [facility] for injection.  I called [facility] to see if they have a sooner appt.  I have LMVM for them to call me to advise on appt. Dr Erlinda Hong- can you advise on any meds for his pain?  He has taken the prednisone, and is taking gabapentin and ibu 800 mg with no relief.

## 2017-12-23 NOTE — Telephone Encounter (Signed)
Nor[place] 5.  #20. 1-2 tab daily prn pain

## 2017-12-24 ENCOUNTER — Telehealth (INDEPENDENT_AMBULATORY_CARE_PROVIDER_SITE_OTHER): Payer: Self-pay | Admitting: Orthopaedic Surgery

## 2017-12-24 MED ORDER — HYDROCODONE-ACETAMINOPHEN 5-325 MG PO TABS: 1.0000 | ORAL_TABLET | Freq: Every day | ORAL | 0 refills | Status: DC

## 2017-12-24 NOTE — Telephone Encounter (Signed)
Patient's wife called stating that the pharmacy advised them that a prior authorization is required on the [place] since they the patient has medicaid.  Thank you.

## 2017-12-24 NOTE — Addendum Note (Signed)
Addended by: Brand Males E on: 12/24/2017 09:08 AM   Modules accepted: Orders

## 2017-12-24 NOTE — Telephone Encounter (Signed)
Message sent in error

## 2017-12-24 NOTE — Telephone Encounter (Signed)
IC and advised. Patient will keep appt with [facility].

## 2017-12-24 NOTE — Telephone Encounter (Signed)
[place] [employer] auth # [id number], I have called wife and advised, she will call pharm

## 2017-12-29 ENCOUNTER — Ambulatory Visit (INDEPENDENT_AMBULATORY_CARE_PROVIDER_SITE_OTHER): Payer: Medicaid Other | Admitting: Physical Medicine and Rehabilitation

## 2017-12-29 ENCOUNTER — Ambulatory Visit (INDEPENDENT_AMBULATORY_CARE_PROVIDER_SITE_OTHER): Payer: Self-pay

## 2017-12-29 ENCOUNTER — Encounter (INDEPENDENT_AMBULATORY_CARE_PROVIDER_SITE_OTHER): Payer: Self-pay | Admitting: Physical Medicine and Rehabilitation

## 2017-12-29 ENCOUNTER — Encounter

## 2017-12-29 VITALS — BP 140/87 | HR 97

## 2017-12-29 DIAGNOSIS — M5116 Intervertebral disc disorders with radiculopathy, lumbar region: Secondary | ICD-10-CM

## 2017-12-29 DIAGNOSIS — M5442 Lumbago with sciatica, left side: Secondary | ICD-10-CM

## 2017-12-29 DIAGNOSIS — G8929 Other chronic pain: Secondary | ICD-10-CM

## 2017-12-29 DIAGNOSIS — M5416 Radiculopathy, lumbar region: Secondary | ICD-10-CM | POA: Diagnosis not present

## 2017-12-29 MED ORDER — BETAMETHASONE SOD PHOS & ACET 6 (3-3) MG/ML IJ SUSP
12.0000 mg | Freq: Once | INTRAMUSCULAR | Status: AC
  Administered 2017-12-29: 12 mg

## 2017-12-29 NOTE — Progress Notes (Signed)
Lajuan Kovaleski - 57 y.o. male MRN [id number]  Date of birth: 1961-03-23  Office Visit Note: Visit Date: 12/29/2017 PCP: Default, Provider, MD Referred by: No ref. provider found  Subjective: Chief Complaint  Patient presents with  . Lower Back - Pain  . Left Hip - Pain  . Left Leg - Pain   HPI: Mr. Tabet is a 57 year old gentleman with 8 months of left hip and leg pain in a predominantly L5 distribution.  He has MRI findings of broad bulging at L4-5 without focal stenosis or compression.  L5-S1 he is facet arthropathy with a left foraminal narrowing more than right and broad disc bulging and protrusion into both foramen.  We are going to complete a diagnostic and therapeutic left L5 transforaminal steroid injection.  He will continue follow-up with Eduard Roux, MD and Tawanna Cooler, PA-C.   ROS Otherwise per HPI.  Assessment & Plan: Visit Diagnoses:  1. Lumbar radiculopathy   2. Radiculopathy due to lumbar intervertebral disc disorder   3. Chronic left-sided low back pain with left-sided sciatica     Plan: No additional findings.   Meds & Orders:  Meds ordered this encounter  Medications  . betamethasone acetate-betamethasone sodium phosphate (CELESTONE) injection 12 mg    Orders Placed This Encounter  Procedures  . XR C-ARM NO REPORT  . Epidural Steroid injection    Follow-up: Return if symptoms worsen or fail to improve.   Procedures: No procedures performed  Lumbosacral Transforaminal Epidural Steroid Injection - Sub-Pedicular Approach with Fluoroscopic Guidance  Patient: Irby Fails      Date of Birth: 1961-05-29 MRN: [id number] PCP: Default, Provider, MD      Visit Date: 12/29/2017   Universal Protocol:    Date/Time: 12/29/2017  Consent Given By: the patient  Position: PRONE  Additional Comments: Vital signs were monitored before and after the procedure. Patient was prepped and draped in the usual sterile fashion. The correct patient, procedure,  and site was verified.   Injection Procedure Details:  Procedure Site One Meds Administered:  Meds ordered this encounter  Medications  . betamethasone acetate-betamethasone sodium phosphate (CELESTONE) injection 12 mg    Laterality: Left  Location/Site:  L5-S1  Needle size: 22 G  Needle type: Spinal  Needle Placement: Transforaminal  Findings:    -Comments: Excellent flow of contrast along the nerve and into the epidural space.  Patient did have concordant pressure in the left hip during the injectate and this did calm down once the patient was discharged.  Procedure Details: After squaring off the end-plates to get a true AP view, the C-arm was positioned so that an oblique view of the foramen as noted above was visualized. The target area is just inferior to the "nose of the scotty dog" or sub pedicular. The soft tissues overlying this structure were infiltrated with 2-3 ml. of 1% Lidocaine without Epinephrine.  The spinal needle was inserted toward the target using a "trajectory" view along the fluoroscope beam.  Under AP and lateral visualization, the needle was advanced so it did not puncture dura and was located close the 6 O'Clock position of the pedical in AP tracterory. Biplanar projections were used to confirm position. Aspiration was confirmed to be negative for CSF and/or blood. A 1-2 ml. volume of Isovue-250 was injected and flow of contrast was noted at each level. Radiographs were obtained for documentation purposes.   After attaining the desired flow of contrast documented above, a 0.5 to 1.0 ml test dose of 0.25%  Marcaine was injected into each respective transforaminal space.  The patient was observed for 90 seconds post injection.  After no sensory deficits were reported, and normal lower extremity motor function was noted,   the above injectate was administered so that equal amounts of the injectate were placed at each foramen (level) into the transforaminal  epidural space.   Additional Comments:  No complications occurred Dressing: Band-Aid    Post-procedure details: Patient was observed during the procedure. Post-procedure instructions were reviewed.  Patient left the clinic in stable condition.    Clinical History: MRI LUMBAR SPINE WITHOUT CONTRAST  TECHNIQUE: Multiplanar, multisequence MR imaging of the lumbar spine was performed. No intravenous contrast was administered.  COMPARISON:  None.  FINDINGS: Segmentation: 5 non rib-bearing lumbar type vertebral bodies are present. The lowest fully formed vertebral body is L5.  Alignment: Slight retrolisthesis at L4-5 measures 3 mm. AP alignment is otherwise anatomic. There straightening of the normal lumbar lordosis. Mild rightward curvature is centered at L3.  Vertebrae: Chronic endplate marrow changes are present at L4-5 and L5-S1. Marrow signal and vertebral body heights are normal.  Conus medullaris and cauda equina: Conus extends to the L1 level. Conus and cauda equina appear normal.  Paraspinal and other soft tissues: Limited imaging of the abdomen is unremarkable. There is no significant adenopathy. Paraspinous musculature is within normal limits.  Disc levels:  L1-2: Lateral disc bulging is present bilaterally. Mild facet hypertrophy is noted on the right. The combination results in mild right foraminal narrowing.  L2-3: Broad-based disc bulge is present. Moderate facet hypertrophy is worse on the right. No significant stenosis is present.  L3-4: Broad-based disc protrusion is present. A far left annular tear is noted. This results in mild left subarticular and foraminal narrowing.  L4-5: A broad-based disc protrusion is present. Moderate facet hypertrophy is noted bilaterally. Central canal is patent. Mild foraminal narrowing is present bilaterally.  L5-S1: A broad-based disc protrusion is present. Disc extends into the foramina bilaterally.  Facet spurring is noted bilaterally. Canal is patent. Moderate foraminal stenosis is worse on the left. This potentially impacts the left L5 nerve root.  IMPRESSION: 1. Broad-based disc protrusion and bilateral facet spurring results in moderate left subarticular stenosis. This potentially impacts the left L5 nerve root. 2. Broad-based disc protrusion associated with facet hypertrophy at L4-5 results in mild bilateral foraminal narrowing. 3. Slight retrolisthesis at L4-5 may be dynamic given the facet disease. Flexion and extension radiographs of lumbar spine would be useful further evaluation. 4. Mild left subarticular and foraminal stenosis at L3-4 associated with a leftward disc protrusion and scoliosis.   Electronically Signed   By: San Morelle M.D.   On: 11/24/2017 08:19   He reports that he has been smoking cigarettes.  He has been smoking about 2.00 packs per day. He has never used smokeless tobacco. No results for input(s): HGBA1C, LABURIC in the last 8760 hours.  Objective:  VS:  HT:    WT:   BMI:     BP:140/87  HR:97bpm  TEMP: ( )  RESP:  Physical Exam  Ortho Exam Imaging: No results found.  Past Medical/Family/Surgical/Social History: Medications & Allergies reviewed per EMR, new medications updated. Patient Active Problem List   Diagnosis Date Noted  . Radiculopathy due to lumbar intervertebral disc disorder 12/29/2017  . Chronic left-sided low back pain with left-sided sciatica 11/30/2017  . 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 Alcoholic (  [facility])   . Back pain   . Heavy smoker   . Hep C w/ coma, chronic ([place])   . Loose, teeth   . Pancreatitis    History reviewed. No pertinent family history. Past Surgical History:  Procedure Laterality Date  . ARTERY REPAIR  2013   neck  . KNEE SURGERY  2013   rt  . MICROLARYNGOSCOPY N/A 07/31/2014   Procedure:  MICROLARYNGOSCOPY WITH BIOPSY ;  Surgeon: Rozetta Nunnery, MD;  Location: [facility];  Service: ENT;  Laterality: N/A;  . NECK SURGERY  2013   cervical fusion-   Social History   Occupational History  . Not on file  Tobacco Use  . Smoking status: Current Every Day Smoker    Packs/day: 2.00    Types: Cigarettes  . Smokeless tobacco: Never Used  Substance and Sexual Activity  . Alcohol use: Yes    Comment: alcoholic (4 27'M a day). 12 pck a day  . Drug use: No  . Sexual activity: Not on file

## 2017-12-29 NOTE — Patient Instructions (Signed)

## 2017-12-29 NOTE — Procedures (Signed)
Lumbosacral Transforaminal Epidural Steroid Injection - Sub-Pedicular Approach with Fluoroscopic Guidance  Patient: [name]      Date of Birth: May 05, 1961 MRN: [id number] PCP: Default, Provider, MD      Visit Date: 12/29/2017   Universal Protocol:    Date/Time: 12/29/2017  Consent Given By: the patient  Position: PRONE  Additional Comments: Vital signs were monitored before and after the procedure. Patient was prepped and draped in the usual sterile fashion. The correct patient, procedure, and site was verified.   Injection Procedure Details:  Procedure Site One Meds Administered:  Meds ordered this encounter  Medications  . betamethasone acetate-betamethasone sodium phosphate (CELESTONE) injection 12 mg    Laterality: Left  Location/Site:  L5-S1  Needle size: 22 G  Needle type: Spinal  Needle Placement: Transforaminal  Findings:    -Comments: Excellent flow of contrast along the nerve and into the epidural space.  Patient did have concordant pressure in the left hip during the injectate and this did calm down once the patient was discharged.  Procedure Details: After squaring off the end-plates to get a true AP view, the C-arm was positioned so that an oblique view of the foramen as noted above was visualized. The target area is just inferior to the "nose of the scotty dog" or sub pedicular. The soft tissues overlying this structure were infiltrated with 2-3 ml. of 1% Lidocaine without Epinephrine.  The spinal needle was inserted toward the target using a "trajectory" view along the fluoroscope beam.  Under AP and lateral visualization, the needle was advanced so it did not puncture dura and was located close the 6 O'Clock position of the pedical in AP tracterory. Biplanar projections were used to confirm position. Aspiration was confirmed to be negative for CSF and/or blood. A 1-2 ml. volume of Isovue-250 was injected and flow of contrast was noted at each level.  Radiographs were obtained for documentation purposes.   After attaining the desired flow of contrast documented above, a 0.5 to 1.0 ml test dose of 0.25% Marcaine was injected into each respective transforaminal space.  The patient was observed for 90 seconds post injection.  After no sensory deficits were reported, and normal lower extremity motor function was noted,   the above injectate was administered so that equal amounts of the injectate were placed at each foramen (level) into the transforaminal epidural space.   Additional Comments:  No complications occurred Dressing: Band-Aid    Post-procedure details: Patient was observed during the procedure. Post-procedure instructions were reviewed.  Patient left the clinic in stable condition.

## 2017-12-29 NOTE — Progress Notes (Signed)
 .  Numeric Pain Rating Scale and Functional Assessment Average Pain 8   In the last MONTH (on 0-10 scale) has pain interfered with the following?  1. General activity like being  able to carry out your everyday physical activities such as walking, climbing stairs, carrying groceries, or moving a chair?  Rating(5)   +Driver, -BT, -Dye Allergies.  

## 2018-01-31 ENCOUNTER — Telehealth (INDEPENDENT_AMBULATORY_CARE_PROVIDER_SITE_OTHER): Payer: Self-pay | Admitting: Orthopaedic Surgery

## 2018-01-31 NOTE — Telephone Encounter (Signed)
Has he had ESI yet?

## 2018-01-31 NOTE — Telephone Encounter (Signed)
Yes on 7/24

## 2018-01-31 NOTE — Telephone Encounter (Signed)
Sounds like he needs to be referred to Dr. Kathyrn Sheriff

## 2018-01-31 NOTE — Telephone Encounter (Signed)
Patient's wife called stating that her husband is in a lot of pain in his back and his arms and wanted to know what to do for him.  She would like a return phone call.  [phone].  Thank you.

## 2018-01-31 NOTE — Telephone Encounter (Signed)
Please advise 

## 2018-02-01 ENCOUNTER — Other Ambulatory Visit (INDEPENDENT_AMBULATORY_CARE_PROVIDER_SITE_OTHER): Payer: Self-pay

## 2018-02-01 DIAGNOSIS — M545 Low back pain: Principal | ICD-10-CM

## 2018-02-01 DIAGNOSIS — G8929 Other chronic pain: Secondary | ICD-10-CM

## 2018-02-01 NOTE — Telephone Encounter (Signed)
Called patient to advise no answer. Referral was made. Someone will call him regarding referral.

## 2018-02-01 NOTE — Telephone Encounter (Signed)
Order made

## 2018-02-04 ENCOUNTER — Telehealth (INDEPENDENT_AMBULATORY_CARE_PROVIDER_SITE_OTHER): Payer: Self-pay | Admitting: Orthopaedic Surgery

## 2018-02-04 NOTE — Telephone Encounter (Addendum)
Pharmacy  [facility]    Medication Refill  Ibuprofen(Advil,Mortrin)800mg  tablet   Patient wife called [name] is having a lot of pain

## 2018-02-04 NOTE — Telephone Encounter (Signed)
Please advise 

## 2018-02-09 ENCOUNTER — Ambulatory Visit (INDEPENDENT_AMBULATORY_CARE_PROVIDER_SITE_OTHER): Payer: Medicaid Other | Admitting: Orthopaedic Surgery

## 2018-04-19 ENCOUNTER — Encounter (INDEPENDENT_AMBULATORY_CARE_PROVIDER_SITE_OTHER): Payer: Self-pay | Admitting: *Deleted

## 2018-08-16 ENCOUNTER — Ambulatory Visit: Payer: Self-pay | Admitting: Family Medicine

## 2018-11-26 ENCOUNTER — Other Ambulatory Visit: Payer: Self-pay

## 2018-11-26 ENCOUNTER — Encounter (HOSPITAL_COMMUNITY)
Admission: EM | Disposition: A | Discharge status: Home / Self Care | Payer: Self-pay | Source: Home / Self Care | Attending: Thoracic Surgery (Cardiothoracic Vascular Surgery)

## 2018-11-26 ENCOUNTER — Emergency Department (HOSPITAL_COMMUNITY): Payer: Medicaid Other

## 2018-11-26 ENCOUNTER — Inpatient Hospital Stay (HOSPITAL_COMMUNITY): Payer: Medicaid Other

## 2018-11-26 ENCOUNTER — Inpatient Hospital Stay (HOSPITAL_COMMUNITY): Payer: Medicaid Other | Admitting: Anesthesiology

## 2018-11-26 ENCOUNTER — Inpatient Hospital Stay (HOSPITAL_COMMUNITY)
Admission: EM | Admit: 2018-11-26 | Discharge: 2018-12-04 | DRG: 853 | Disposition: A | Discharge status: Home / Self Care | Payer: Medicaid Other | Attending: Thoracic Surgery (Cardiothoracic Vascular Surgery) | Admitting: Thoracic Surgery (Cardiothoracic Vascular Surgery)

## 2018-11-26 ENCOUNTER — Encounter (HOSPITAL_COMMUNITY): Payer: Self-pay

## 2018-11-26 DIAGNOSIS — F1010 Alcohol abuse, uncomplicated: Secondary | ICD-10-CM | POA: Diagnosis present

## 2018-11-26 DIAGNOSIS — E871 Hypo-osmolality and hyponatremia: Secondary | ICD-10-CM | POA: Diagnosis present

## 2018-11-26 DIAGNOSIS — J189 Pneumonia, unspecified organism: Secondary | ICD-10-CM | POA: Diagnosis present

## 2018-11-26 DIAGNOSIS — I493 Ventricular premature depolarization: Secondary | ICD-10-CM | POA: Diagnosis present

## 2018-11-26 DIAGNOSIS — B182 Chronic viral hepatitis C: Secondary | ICD-10-CM | POA: Diagnosis present

## 2018-11-26 DIAGNOSIS — Z79899 Other long term (current) drug therapy: Secondary | ICD-10-CM | POA: Diagnosis not present

## 2018-11-26 DIAGNOSIS — J869 Pyothorax without fistula: Secondary | ICD-10-CM | POA: Diagnosis present

## 2018-11-26 DIAGNOSIS — R0902 Hypoxemia: Secondary | ICD-10-CM | POA: Diagnosis present

## 2018-11-26 DIAGNOSIS — F1020 Alcohol dependence, uncomplicated: Secondary | ICD-10-CM

## 2018-11-26 DIAGNOSIS — F17210 Nicotine dependence, cigarettes, uncomplicated: Secondary | ICD-10-CM

## 2018-11-26 DIAGNOSIS — R748 Abnormal levels of other serum enzymes: Secondary | ICD-10-CM | POA: Diagnosis not present

## 2018-11-26 DIAGNOSIS — E43 Unspecified severe protein-calorie malnutrition: Secondary | ICD-10-CM | POA: Diagnosis present

## 2018-11-26 DIAGNOSIS — I10 Essential (primary) hypertension: Secondary | ICD-10-CM | POA: Diagnosis present

## 2018-11-26 DIAGNOSIS — J90 Pleural effusion, not elsewhere classified: Secondary | ICD-10-CM | POA: Diagnosis present

## 2018-11-26 DIAGNOSIS — E860 Dehydration: Secondary | ICD-10-CM | POA: Diagnosis present

## 2018-11-26 DIAGNOSIS — Z7982 Long term (current) use of aspirin: Secondary | ICD-10-CM | POA: Diagnosis not present

## 2018-11-26 DIAGNOSIS — J449 Chronic obstructive pulmonary disease, unspecified: Secondary | ICD-10-CM

## 2018-11-26 DIAGNOSIS — D509 Iron deficiency anemia, unspecified: Secondary | ICD-10-CM | POA: Diagnosis present

## 2018-11-26 DIAGNOSIS — Z6823 Body mass index (BMI) 23.0-23.9, adult: Secondary | ICD-10-CM

## 2018-11-26 DIAGNOSIS — Z720 Tobacco use: Secondary | ICD-10-CM | POA: Diagnosis present

## 2018-11-26 DIAGNOSIS — R0602 Shortness of breath: Secondary | ICD-10-CM | POA: Diagnosis present

## 2018-11-26 DIAGNOSIS — A419 Sepsis, unspecified organism: Principal | ICD-10-CM | POA: Diagnosis present

## 2018-11-26 DIAGNOSIS — D473 Essential (hemorrhagic) thrombocythemia: Secondary | ICD-10-CM

## 2018-11-26 DIAGNOSIS — J440 Chronic obstructive pulmonary disease with acute lower respiratory infection: Secondary | ICD-10-CM | POA: Diagnosis present

## 2018-11-26 DIAGNOSIS — Z20828 Contact with and (suspected) exposure to other viral communicable diseases: Secondary | ICD-10-CM | POA: Diagnosis present

## 2018-11-26 DIAGNOSIS — I700 Atherosclerosis of aorta: Secondary | ICD-10-CM | POA: Diagnosis not present

## 2018-11-26 DIAGNOSIS — J181 Lobar pneumonia, unspecified organism: Secondary | ICD-10-CM | POA: Diagnosis not present

## 2018-11-26 DIAGNOSIS — Z880 Allergy status to penicillin: Secondary | ICD-10-CM | POA: Diagnosis not present

## 2018-11-26 DIAGNOSIS — K089 Disorder of teeth and supporting structures, unspecified: Secondary | ICD-10-CM

## 2018-11-26 DIAGNOSIS — Z7289 Other problems related to lifestyle: Secondary | ICD-10-CM | POA: Diagnosis not present

## 2018-11-26 HISTORY — DX: Chronic obstructive pulmonary disease, unspecified: J44.9

## 2018-11-26 HISTORY — DX: Tobacco use: Z72.0

## 2018-11-26 HISTORY — PX: VIDEO ASSISTED THORACOSCOPY (VATS)/EMPYEMA: SHX6172

## 2018-11-26 HISTORY — DX: Pyothorax without fistula: J86.9

## 2018-11-26 HISTORY — DX: Alcohol abuse, uncomplicated: F10.10

## 2018-11-26 HISTORY — PX: VIDEO BRONCHOSCOPY: SHX5072

## 2018-11-26 HISTORY — DX: Pneumonia, unspecified organism: J18.9

## 2018-11-26 HISTORY — DX: Disorder of teeth and supporting structures, unspecified: K08.9

## 2018-11-26 LAB — SARS CORONAVIRUS 2 BY RT PCR (HOSPITAL ORDER, PERFORMED IN ~~LOC~~ HOSPITAL LAB): SARS Coronavirus 2: NEGATIVE

## 2018-11-26 LAB — CBC WITH DIFFERENTIAL/PLATELET
Abs Immature Granulocytes: 0.12 10*3/uL — ABNORMAL HIGH (ref 0.00–0.07)
Basophils Absolute: 0.1 10*3/uL (ref 0.0–0.1)
Basophils Relative: 1 %
Eosinophils Absolute: 0.1 10*3/uL (ref 0.0–0.5)
Eosinophils Relative: 0 %
HCT: 33.5 % — ABNORMAL LOW (ref 39.0–52.0)
Hemoglobin: 10.1 g/dL — ABNORMAL LOW (ref 13.0–17.0)
Immature Granulocytes: 1 %
Lymphocytes Relative: 2 %
Lymphs Abs: 0.3 10*3/uL — ABNORMAL LOW (ref 0.7–4.0)
MCH: 22.6 pg — ABNORMAL LOW (ref 26.0–34.0)
MCHC: 30.1 g/dL (ref 30.0–36.0)
MCV: 74.9 fL — ABNORMAL LOW (ref 80.0–100.0)
Monocytes Absolute: 1 10*3/uL (ref 0.1–1.0)
Monocytes Relative: 5 %
Neutro Abs: 17.5 10*3/uL — ABNORMAL HIGH (ref 1.7–7.7)
Neutrophils Relative %: 91 %
Platelets: 747 10*3/uL — ABNORMAL HIGH (ref 150–400)
RBC: 4.47 MIL/uL (ref 4.22–5.81)
RDW: 19.3 % — ABNORMAL HIGH (ref 11.5–15.5)
WBC: 19.1 10*3/uL — ABNORMAL HIGH (ref 4.0–10.5)
nRBC: 0 % (ref 0.0–0.2)

## 2018-11-26 LAB — URINALYSIS, ROUTINE W REFLEX MICROSCOPIC
Bilirubin Urine: NEGATIVE
Glucose, UA: NEGATIVE mg/dL
Hgb urine dipstick: NEGATIVE
Ketones, ur: NEGATIVE mg/dL
Leukocytes,Ua: NEGATIVE
Nitrite: NEGATIVE
Protein, ur: NEGATIVE mg/dL
Specific Gravity, Urine: 1.009 (ref 1.005–1.030)
pH: 6 (ref 5.0–8.0)

## 2018-11-26 LAB — COMPREHENSIVE METABOLIC PANEL
ALT: 18 U/L (ref 0–44)
AST: 22 U/L (ref 15–41)
Albumin: 1.9 g/dL — ABNORMAL LOW (ref 3.5–5.0)
Alkaline Phosphatase: 143 U/L — ABNORMAL HIGH (ref 38–126)
Anion gap: 13 (ref 5–15)
BUN: 6 mg/dL (ref 6–20)
CO2: 23 mmol/L (ref 22–32)
Calcium: 8.4 mg/dL — ABNORMAL LOW (ref 8.9–10.3)
Chloride: 92 mmol/L — ABNORMAL LOW (ref 98–111)
Creatinine, Ser: 0.68 mg/dL (ref 0.61–1.24)
GFR calc Af Amer: >60 mL/min (ref >60)
GFR calc non Af Amer: >60 mL/min (ref >60)
Glucose, Bld: 124 mg/dL — ABNORMAL HIGH (ref 70–99)
Potassium: 4.8 mmol/L (ref 3.5–5.1)
Sodium: 128 mmol/L — ABNORMAL LOW (ref 135–145)
Total Bilirubin: 0.6 mg/dL (ref 0.3–1.2)
Total Protein: 7 g/dL (ref 6.5–8.1)

## 2018-11-26 LAB — PROTIME-INR
INR: 1.2 (ref 0.8–1.2)
Prothrombin Time: 15.1 seconds (ref 11.4–15.2)

## 2018-11-26 LAB — BRAIN NATRIURETIC PEPTIDE: B Natriuretic Peptide: 166.6 pg/mL — ABNORMAL HIGH (ref 0.0–100.0)

## 2018-11-26 LAB — ABO/RH: ABO/RH(D): AB POS

## 2018-11-26 LAB — OSMOLALITY: Osmolality: 269 mOsm/kg — ABNORMAL LOW (ref 275–295)

## 2018-11-26 LAB — OSMOLALITY, URINE: Osmolality, Ur: 291 mOsm/kg — ABNORMAL LOW (ref 300–900)

## 2018-11-26 LAB — LACTIC ACID, PLASMA: Lactic Acid, Venous: 1.8 mmol/L (ref 0.5–1.9)

## 2018-11-26 LAB — SODIUM, URINE, RANDOM: Sodium, Ur: 49 mmol/L

## 2018-11-26 LAB — TYPE AND SCREEN
ABO/RH(D): AB POS
Antibody Screen: NEGATIVE

## 2018-11-26 SURGERY — VIDEO ASSISTED THORACOSCOPY (VATS)/EMPYEMA
Anesthesia: General | Site: Chest | Laterality: Right

## 2018-11-26 MED ORDER — BISACODYL 5 MG PO TBEC
10.0000 mg | DELAYED_RELEASE_TABLET | Freq: Every day | ORAL | Status: DC
  Administered 2018-11-27 – 2018-11-28 (×2): 10 mg via ORAL
  Filled 2018-11-26 (×2): qty 2

## 2018-11-26 MED ORDER — LACTATED RINGERS IV SOLN
INTRAVENOUS | Status: DC | PRN
  Administered 2018-11-26: 17:00:00 via INTRAVENOUS

## 2018-11-26 MED ORDER — SENNOSIDES-DOCUSATE SODIUM 8.6-50 MG PO TABS: 1.0000 | ORAL_TABLET | Freq: Every evening | ORAL | Status: DC | PRN

## 2018-11-26 MED ORDER — MIDAZOLAM HCL 2 MG/2ML IJ SOLN
INTRAMUSCULAR | Status: AC
  Administered 2018-11-26: 1 mg via INTRAVENOUS
  Filled 2018-11-26: qty 2

## 2018-11-26 MED ORDER — SODIUM CHLORIDE 0.9 % IV SOLN
INTRAVENOUS | Status: DC | PRN
  Administered 2018-11-26: 25 ug/min via INTRAVENOUS

## 2018-11-26 MED ORDER — CHLORDIAZEPOXIDE HCL 25 MG PO CAPS: 25.0000 mg | ORAL_CAPSULE | Freq: Once | ORAL | Status: DC

## 2018-11-26 MED ORDER — PROPOFOL 10 MG/ML IV BOLUS
INTRAVENOUS | Status: AC
  Filled 2018-11-26: qty 20

## 2018-11-26 MED ORDER — LIDOCAINE 2% (20 MG/ML) 5 ML SYRINGE
INTRAMUSCULAR | Status: DC | PRN
  Administered 2018-11-26: 40 mg via INTRAVENOUS

## 2018-11-26 MED ORDER — ONDANSETRON HCL 4 MG/2ML IJ SOLN
INTRAMUSCULAR | Status: AC
  Filled 2018-11-26: qty 2

## 2018-11-26 MED ORDER — FENTANYL CITRATE (PF) 100 MCG/2ML IJ SOLN
INTRAMUSCULAR | Status: DC | PRN
  Administered 2018-11-26: 50 ug via INTRAVENOUS
  Administered 2018-11-26: 25 ug via INTRAVENOUS
  Administered 2018-11-26 (×2): 50 ug via INTRAVENOUS
  Administered 2018-11-26: 25 ug via INTRAVENOUS
  Administered 2018-11-26: 50 ug via INTRAVENOUS

## 2018-11-26 MED ORDER — MIDAZOLAM BOLUS VIA INFUSION: 1.0000 mg | INTRAVENOUS | Status: DC | PRN

## 2018-11-26 MED ORDER — LORAZEPAM 2 MG/ML IJ SOLN
2.0000 mg | INTRAMUSCULAR | Status: DC | PRN
  Administered 2018-11-26 – 2018-11-29 (×5): 2 mg via INTRAVENOUS
  Filled 2018-11-26 (×5): qty 1

## 2018-11-26 MED ORDER — OXYCODONE HCL 5 MG PO TABS
5.0000 mg | ORAL_TABLET | ORAL | Status: DC | PRN
  Administered 2018-11-27 – 2018-12-02 (×18): 10 mg via ORAL
  Filled 2018-11-26 (×20): qty 2

## 2018-11-26 MED ORDER — ACETAMINOPHEN 10 MG/ML IV SOLN
INTRAVENOUS | Status: AC
  Administered 2018-11-26: 1000 mg via INTRAVENOUS
  Filled 2018-11-26: qty 100

## 2018-11-26 MED ORDER — IPRATROPIUM-ALBUTEROL 0.5-2.5 (3) MG/3ML IN SOLN
3.0000 mL | Freq: Four times a day (QID) | RESPIRATORY_TRACT | Status: DC
  Administered 2018-11-26 – 2018-11-30 (×15): 3 mL via RESPIRATORY_TRACT
  Filled 2018-11-26 (×15): qty 3

## 2018-11-26 MED ORDER — FENTANYL CITRATE (PF) 100 MCG/2ML IJ SOLN
INTRAMUSCULAR | Status: AC
  Filled 2018-11-26: qty 2

## 2018-11-26 MED ORDER — FOLIC ACID 1 MG PO TABS
1.0000 mg | ORAL_TABLET | Freq: Every day | ORAL | Status: DC
  Administered 2018-11-27: 1 mg via ORAL
  Filled 2018-11-26: qty 1

## 2018-11-26 MED ORDER — ROCURONIUM BROMIDE 10 MG/ML (PF) SYRINGE
PREFILLED_SYRINGE | INTRAVENOUS | Status: AC
  Filled 2018-11-26: qty 10

## 2018-11-26 MED ORDER — VANCOMYCIN HCL 10 G IV SOLR
1500.0000 mg | Freq: Once | INTRAVENOUS | Status: AC
  Administered 2018-11-26: 1500 mg via INTRAVENOUS
  Filled 2018-11-26: qty 1500

## 2018-11-26 MED ORDER — MORPHINE SULFATE (PF) 2 MG/ML IV SOLN
2.0000 mg | INTRAVENOUS | Status: DC | PRN
  Administered 2018-11-26 – 2018-11-29 (×3): 2 mg via INTRAVENOUS
  Filled 2018-11-26 (×4): qty 1

## 2018-11-26 MED ORDER — ACETAMINOPHEN 325 MG PO TABS: 650.0000 mg | ORAL_TABLET | Freq: Four times a day (QID) | ORAL | Status: DC | PRN

## 2018-11-26 MED ORDER — ACETAMINOPHEN 500 MG PO TABS
1000.0000 mg | ORAL_TABLET | Freq: Once | ORAL | Status: AC
  Administered 2018-11-26: 1000 mg via ORAL
  Filled 2018-11-26: qty 2

## 2018-11-26 MED ORDER — LIDOCAINE 2% (20 MG/ML) 5 ML SYRINGE
INTRAMUSCULAR | Status: AC
  Filled 2018-11-26: qty 5

## 2018-11-26 MED ORDER — ACETAMINOPHEN 160 MG/5ML PO SOLN: 1000.0000 mg | Freq: Four times a day (QID) | ORAL | Status: AC

## 2018-11-26 MED ORDER — ROCURONIUM BROMIDE 50 MG/5ML IV SOSY
PREFILLED_SYRINGE | INTRAVENOUS | Status: DC | PRN
  Administered 2018-11-26: 10 mg via INTRAVENOUS
  Administered 2018-11-26: 50 mg via INTRAVENOUS

## 2018-11-26 MED ORDER — IOHEXOL 300 MG/ML  SOLN
100.0000 mL | Freq: Once | INTRAMUSCULAR | Status: AC | PRN
  Administered 2018-11-26: 100 mL via INTRAVENOUS

## 2018-11-26 MED ORDER — VANCOMYCIN HCL 10 G IV SOLR
1250.0000 mg | Freq: Two times a day (BID) | INTRAVENOUS | Status: DC
  Administered 2018-11-27 – 2018-11-28 (×3): 1250 mg via INTRAVENOUS
  Filled 2018-11-26 (×6): qty 1250

## 2018-11-26 MED ORDER — SODIUM CHLORIDE 0.9 % IV BOLUS
1000.0000 mL | Freq: Once | INTRAVENOUS | Status: AC
  Administered 2018-11-26: 2000 mL via INTRAVENOUS

## 2018-11-26 MED ORDER — ONDANSETRON HCL 4 MG/2ML IJ SOLN
4.0000 mg | Freq: Four times a day (QID) | INTRAMUSCULAR | Status: DC | PRN
  Administered 2018-11-29 (×2): 4 mg via INTRAVENOUS
  Filled 2018-11-26 (×2): qty 2

## 2018-11-26 MED ORDER — ACETAMINOPHEN 500 MG PO TABS
1000.0000 mg | ORAL_TABLET | Freq: Four times a day (QID) | ORAL | Status: AC
  Administered 2018-11-27 – 2018-12-01 (×18): 1000 mg via ORAL
  Filled 2018-11-26 (×18): qty 2

## 2018-11-26 MED ORDER — ALBUTEROL SULFATE HFA 108 (90 BASE) MCG/ACT IN AERS
INHALATION_SPRAY | RESPIRATORY_TRACT | Status: AC
  Filled 2018-11-26: qty 6.7

## 2018-11-26 MED ORDER — SUCCINYLCHOLINE CHLORIDE 200 MG/10ML IV SOSY
PREFILLED_SYRINGE | INTRAVENOUS | Status: AC
  Filled 2018-11-26: qty 10

## 2018-11-26 MED ORDER — ONDANSETRON HCL 4 MG/2ML IJ SOLN
INTRAMUSCULAR | Status: DC | PRN
  Administered 2018-11-26: 4 mg via INTRAVENOUS

## 2018-11-26 MED ORDER — VITAMIN B-1 100 MG PO TABS
100.0000 mg | ORAL_TABLET | Freq: Every day | ORAL | Status: DC
  Administered 2018-11-27: 100 mg via ORAL
  Filled 2018-11-26: qty 1

## 2018-11-26 MED ORDER — PROMETHAZINE HCL 25 MG PO TABS: 12.5000 mg | ORAL_TABLET | Freq: Four times a day (QID) | ORAL | Status: DC | PRN

## 2018-11-26 MED ORDER — 0.9 % SODIUM CHLORIDE (POUR BTL) OPTIME
TOPICAL | Status: DC | PRN
  Administered 2018-11-26: 17:00:00 2000 mL

## 2018-11-26 MED ORDER — TRAMADOL HCL 50 MG PO TABS
50.0000 mg | ORAL_TABLET | Freq: Four times a day (QID) | ORAL | Status: DC | PRN
  Administered 2018-11-27 – 2018-12-04 (×5): 100 mg via ORAL
  Filled 2018-11-26 (×5): qty 2

## 2018-11-26 MED ORDER — SODIUM CHLORIDE 0.9 % IV SOLN
2.0000 g | Freq: Three times a day (TID) | INTRAVENOUS | Status: DC
  Administered 2018-11-26 – 2018-12-01 (×14): 2 g via INTRAVENOUS
  Filled 2018-11-26 (×16): qty 2

## 2018-11-26 MED ORDER — VANCOMYCIN HCL IN DEXTROSE 1-5 GM/200ML-% IV SOLN: 1000.0000 mg | Freq: Once | INTRAVENOUS | Status: DC

## 2018-11-26 MED ORDER — PIPERACILLIN-TAZOBACTAM 3.375 G IVPB
3.3750 g | Freq: Three times a day (TID) | INTRAVENOUS | Status: DC
  Administered 2018-11-27: 3.375 g via INTRAVENOUS
  Filled 2018-11-26: qty 50

## 2018-11-26 MED ORDER — ALBUTEROL SULFATE HFA 108 (90 BASE) MCG/ACT IN AERS
INHALATION_SPRAY | RESPIRATORY_TRACT | Status: DC | PRN
  Administered 2018-11-26: 3 via RESPIRATORY_TRACT

## 2018-11-26 MED ORDER — ADULT MULTIVITAMIN W/MINERALS CH
1.0000 | ORAL_TABLET | Freq: Every day | ORAL | Status: DC
  Administered 2018-11-27 – 2018-12-04 (×8): 1 via ORAL
  Filled 2018-11-26 (×8): qty 1

## 2018-11-26 MED ORDER — PIPERACILLIN-TAZOBACTAM 3.375 G IVPB
3.3750 g | Freq: Four times a day (QID) | INTRAVENOUS | Status: DC
  Administered 2018-11-26: 3.375 g via INTRAVENOUS

## 2018-11-26 MED ORDER — PROPOFOL 10 MG/ML IV BOLUS
INTRAVENOUS | Status: DC | PRN
  Administered 2018-11-26: 100 mg via INTRAVENOUS

## 2018-11-26 MED ORDER — FENTANYL CITRATE (PF) 250 MCG/5ML IJ SOLN
INTRAMUSCULAR | Status: AC
  Filled 2018-11-26: qty 5

## 2018-11-26 MED ORDER — SUGAMMADEX SODIUM 200 MG/2ML IV SOLN
INTRAVENOUS | Status: DC | PRN
  Administered 2018-11-26: 150 mg via INTRAVENOUS

## 2018-11-26 MED ORDER — ENOXAPARIN SODIUM 40 MG/0.4ML ~~LOC~~ SOLN: 40.0000 mg | SUBCUTANEOUS | Status: DC

## 2018-11-26 MED ORDER — SUCCINYLCHOLINE CHLORIDE 20 MG/ML IJ SOLN
INTRAMUSCULAR | Status: DC | PRN
  Administered 2018-11-26: 120 mg via INTRAVENOUS

## 2018-11-26 MED ORDER — ACETAMINOPHEN 10 MG/ML IV SOLN
1000.0000 mg | Freq: Once | INTRAVENOUS | Status: AC
  Administered 2018-11-26: 1000 mg via INTRAVENOUS

## 2018-11-26 MED ORDER — OXYCODONE HCL 5 MG PO TABS: 5.0000 mg | ORAL_TABLET | Freq: Once | ORAL | Status: DC | PRN

## 2018-11-26 MED ORDER — SODIUM CHLORIDE 0.9 % IV SOLN
2.0000 g | Freq: Once | INTRAVENOUS | Status: AC
  Administered 2018-11-26: 2 g via INTRAVENOUS
  Filled 2018-11-26: qty 2

## 2018-11-26 MED ORDER — MIDAZOLAM HCL 2 MG/2ML IJ SOLN
INTRAMUSCULAR | Status: AC
  Filled 2018-11-26: qty 2

## 2018-11-26 MED ORDER — MIDAZOLAM HCL 5 MG/5ML IJ SOLN
INTRAMUSCULAR | Status: DC | PRN
  Administered 2018-11-26: 2 mg via INTRAVENOUS

## 2018-11-26 MED ORDER — SODIUM CHLORIDE 0.9 % IV SOLN: 2.0000 g | Freq: Once | INTRAVENOUS | Status: DC

## 2018-11-26 MED ORDER — DEXAMETHASONE SODIUM PHOSPHATE 10 MG/ML IJ SOLN
INTRAMUSCULAR | Status: DC | PRN
  Administered 2018-11-26: 5 mg via INTRAVENOUS

## 2018-11-26 MED ORDER — KCL IN DEXTROSE-NACL 20-5-0.9 MEQ/L-%-% IV SOLN
INTRAVENOUS | Status: DC
  Administered 2018-11-26 – 2018-11-28 (×2): via INTRAVENOUS
  Filled 2018-11-26 (×3): qty 1000

## 2018-11-26 MED ORDER — OXYCODONE HCL 5 MG/5ML PO SOLN: 5.0000 mg | Freq: Once | ORAL | Status: DC | PRN

## 2018-11-26 MED ORDER — SENNOSIDES-DOCUSATE SODIUM 8.6-50 MG PO TABS
1.0000 | ORAL_TABLET | Freq: Every day | ORAL | Status: DC
  Administered 2018-11-27 – 2018-11-28 (×2): 1 via ORAL
  Filled 2018-11-26 (×2): qty 1

## 2018-11-26 MED ORDER — ACETAMINOPHEN 650 MG RE SUPP: 650.0000 mg | Freq: Four times a day (QID) | RECTAL | Status: DC | PRN

## 2018-11-26 MED ORDER — FENTANYL CITRATE (PF) 100 MCG/2ML IJ SOLN
25.0000 ug | INTRAMUSCULAR | Status: DC | PRN
  Administered 2018-11-26 (×2): 50 ug via INTRAVENOUS

## 2018-11-26 MED ORDER — DEXAMETHASONE SODIUM PHOSPHATE 10 MG/ML IJ SOLN
INTRAMUSCULAR | Status: AC
  Filled 2018-11-26: qty 1

## 2018-11-26 MED ORDER — ONDANSETRON HCL 4 MG/2ML IJ SOLN: 4.0000 mg | Freq: Four times a day (QID) | INTRAMUSCULAR | Status: DC | PRN

## 2018-11-26 SURGICAL SUPPLY — 75 items
APPLICATOR TIP EXT COSEAL (VASCULAR PRODUCTS) IMPLANT
APPLIER CLIP ROT 10 11.4 M/L (STAPLE)
CANISTER SUCT 3000ML PPV (MISCELLANEOUS) ×3 IMPLANT
CATH KIT ON Q 5IN SLV (PAIN MANAGEMENT) IMPLANT
CATH THORACIC 28FR (CATHETERS) IMPLANT
CATH THORACIC 36FR (CATHETERS) IMPLANT
CATH THORACIC 36FR RT ANG (CATHETERS) IMPLANT
CELLS DAT CNTRL 66122 CELL SVR (MISCELLANEOUS) IMPLANT
CLIP APPLIE ROT 10 11.4 M/L (STAPLE) IMPLANT
CLIP VESOCCLUDE MED 6/CT (CLIP) IMPLANT
CONN ST 1/4X3/8  BEN (MISCELLANEOUS) ×3
CONN ST 1/4X3/8 BEN (MISCELLANEOUS) ×6 IMPLANT
CONN Y 3/8X3/8X3/8  BEN (MISCELLANEOUS) ×1
CONN Y 3/8X3/8X3/8 BEN (MISCELLANEOUS) ×2 IMPLANT
CONT SPEC 4OZ CLIKSEAL STRL BL (MISCELLANEOUS) ×9 IMPLANT
COVER SURGICAL LIGHT HANDLE (MISCELLANEOUS) ×3 IMPLANT
COVER WAND RF STERILE (DRAPES) IMPLANT
DERMABOND ADVANCED (GAUZE/BANDAGES/DRESSINGS) ×1
DERMABOND ADVANCED .7 DNX12 (GAUZE/BANDAGES/DRESSINGS) ×2 IMPLANT
DRAIN CHANNEL 32F RND 10.7 FF (WOUND CARE) ×9 IMPLANT
DRAPE LAPAROSCOPIC ABDOMINAL (DRAPES) ×3 IMPLANT
DRAPE WARM FLUID 44X44 (DRAPES) IMPLANT
ELECT BLADE 6.5 EXT (BLADE) ×3 IMPLANT
ELECT REM PT RETURN 9FT ADLT (ELECTROSURGICAL) ×3
ELECTRODE REM PT RTRN 9FT ADLT (ELECTROSURGICAL) ×2 IMPLANT
FLUID NSS /IRRIG 3000 ML XXX (IV SOLUTION) ×3 IMPLANT
GAUZE SPONGE 4X4 12PLY STRL (GAUZE/BANDAGES/DRESSINGS) ×3 IMPLANT
GAUZE SPONGE 4X4 12PLY STRL LF (GAUZE/BANDAGES/DRESSINGS) ×3 IMPLANT
GLOVE BIO SURGEON STRL SZ7.5 (GLOVE) ×3 IMPLANT
GLOVE BIOGEL PI IND STRL 6 (GLOVE) ×6 IMPLANT
GLOVE BIOGEL PI IND STRL 6.5 (GLOVE) ×6 IMPLANT
GLOVE BIOGEL PI INDICATOR 6 (GLOVE) ×3
GLOVE BIOGEL PI INDICATOR 6.5 (GLOVE) ×3
GLOVE ORTHO TXT STRL SZ7.5 (GLOVE) ×6 IMPLANT
GOWN STRL REUS W/ TWL LRG LVL3 (GOWN DISPOSABLE) ×10 IMPLANT
GOWN STRL REUS W/TWL LRG LVL3 (GOWN DISPOSABLE) ×5
KIT BASIN OR (CUSTOM PROCEDURE TRAY) ×3 IMPLANT
KIT TURNOVER KIT B (KITS) ×3 IMPLANT
NS IRRIG 1000ML POUR BTL (IV SOLUTION) ×6 IMPLANT
OIL SILICONE PENTAX (PARTS (SERVICE/REPAIRS)) ×3 IMPLANT
PACK CHEST (CUSTOM PROCEDURE TRAY) ×3 IMPLANT
PAD ARMBOARD 7.5X6 YLW CONV (MISCELLANEOUS) ×6 IMPLANT
RTRCTR WOUND ALEXIS 18CM MED (MISCELLANEOUS)
SEALANT PROGEL (MISCELLANEOUS) IMPLANT
SEALANT SURG COSEAL 4ML (VASCULAR PRODUCTS) IMPLANT
SEALANT SURG COSEAL 8ML (VASCULAR PRODUCTS) IMPLANT
SET IRRIG TUBING LAPAROSCOPIC (IRRIGATION / IRRIGATOR) ×3 IMPLANT
SOLUTION ANTI FOG 6CC (MISCELLANEOUS) ×3 IMPLANT
SUT MNCRL AB 3-0 PS2 18 (SUTURE) ×3 IMPLANT
SUT MNCRL AB 4-0 PS2 18 (SUTURE) IMPLANT
SUT PROLENE 3 0 SH DA (SUTURE) IMPLANT
SUT PROLENE 4 0 RB 1 (SUTURE)
SUT PROLENE 4-0 RB1 .5 CRCL 36 (SUTURE) IMPLANT
SUT SILK  1 MH (SUTURE) ×6
SUT SILK 1 MH (SUTURE) ×12 IMPLANT
SUT SILK 1 TIES 10X30 (SUTURE) IMPLANT
SUT SILK 2 0SH CR/8 30 (SUTURE) IMPLANT
SUT VIC AB 1 CTX 18 (SUTURE) IMPLANT
SUT VIC AB 1 CTX 27 (SUTURE) ×3 IMPLANT
SUT VIC AB 1 CTX 36 (SUTURE) ×1
SUT VIC AB 1 CTX36XBRD ANBCTR (SUTURE) ×2 IMPLANT
SUT VIC AB 2-0 CTX 36 (SUTURE) ×3 IMPLANT
SUT VIC AB 2-0 UR6 27 (SUTURE) ×6 IMPLANT
SUT VIC AB 3-0 SH 8-18 (SUTURE) IMPLANT
SUT VICRYL 2 TP 1 (SUTURE) IMPLANT
SYSTEM SAHARA CHEST DRAIN RE-I (WOUND CARE) ×3 IMPLANT
TAPE CLOTH 4X10 WHT NS (GAUZE/BANDAGES/DRESSINGS) ×3 IMPLANT
TAPE CLOTH SURG 4X10 WHT LF (GAUZE/BANDAGES/DRESSINGS) ×3 IMPLANT
TIP APPLICATOR SPRAY EXTEND 16 (VASCULAR PRODUCTS) IMPLANT
TOWEL GREEN STERILE (TOWEL DISPOSABLE) ×3 IMPLANT
TOWEL GREEN STERILE FF (TOWEL DISPOSABLE) ×3 IMPLANT
TRAP SPECIMEN MUCOUS 40CC (MISCELLANEOUS) ×9 IMPLANT
TRAY FOLEY MTR SLVR 14FR STAT (SET/KITS/TRAYS/PACK) ×3 IMPLANT
TUBE CONNECTING 12X1/4 (SUCTIONS) ×3 IMPLANT
WATER STERILE IRR 1000ML POUR (IV SOLUTION) ×6 IMPLANT

## 2018-11-26 NOTE — H&P (Addendum)
[address]       [place] [address]             [phone]          CARDIOTHORACIC SURGERY HISTORY AND PHYSICAL EXAM  PCP is Default, Provider, MD Referring Provider is Aldine Contes, MD  Reason for consultation:  empyema  HPI:  Patient is a 58 year old male with history of COPD, longstanding heavy tobacco abuse and alcohol abuse who presented to the emergency department with a progressive 2-week history of fevers, productive cough, and right-sided pleuritic chest pain and underwent chest x-ray and CT scan which are diagnostic of community-acquired pneumonia with empyema.  Patient states that approximately 2 weeks ago he began to have fevers and malaise.  He developed a productive cough.  Symptoms have gotten worse and over the last few days he has developed pain along the right side of his chest which is exacerbated by deep breath and cough.  He has not had abdominal pain and he reports normal bowel function with no history of diarrhea.  He denies history of hemoptysis.  He denies any changes in olfactory sensory function or other neurologic changes.  He denies blurry vision.  He has not had nausea or vomiting.  Symptoms got much worse over the past 12 to 24 hours prompting him to present to the emergency department.  Admission blood work reveals leukocytosis with white blood count 19,000 and chest x-ray revealed diffuse opacity in the right lung field.  CT scan demonstrates findings consistent with empyema.  Patient lives locally in [place] with his close friend who he refers to as his wife.  He is not working.  He admits to smoking 1-1/2 packs of cigarettes daily and drinking at least 12 beers every day.  He reports no significant physical limitations prior to the last few weeks and states that prior to that he was functionally independent.  Past Medical History:  Diagnosis Date  . Alcoholic ([place])   . Back pain   . COPD (chronic obstructive pulmonary  disease) ([place])   . Heavy smoker   . Hep C w/ coma, chronic ([place])   . Loose, teeth   . Pancreatitis     Past Surgical History:  Procedure Laterality Date  . ARTERY REPAIR  2013   neck  . KNEE SURGERY  2013   rt  . MICROLARYNGOSCOPY N/A 07/31/2014   Procedure: MICROLARYNGOSCOPY WITH BIOPSY ;  Surgeon: Rozetta Nunnery, MD;  Location: [facility];  Service: ENT;  Laterality: N/A;  . NECK SURGERY  2013   cervical fusion-    History reviewed. No pertinent family history.  Social History   Socioeconomic History  . Marital status: Single    Spouse name: Not on file  . Number of children: Not on file  . Years of education: Not on file  . Highest education level: Not on file  Occupational History  . Not on file  Social Needs  . Financial resource strain: Not on file  . Food insecurity    Worry: Not on file    Inability: Not on file  . Transportation needs    Medical: Not on file    Non-medical: Not on file  Tobacco Use  . Smoking status: Current Every Day Smoker    Packs/day: 2.00    Types: Cigarettes  . Smokeless tobacco: Never Used  Substance and Sexual Activity  . Alcohol use: Yes    Comment: alcoholic (4  40's a day). 12 pck a day  . Drug use: No  . Sexual activity: Not on file  Lifestyle  . Physical activity    Days per week: Not on file    Minutes per session: Not on file  . Stress: Not on file  Relationships  . Social Herbalist on phone: Not on file    Gets together: Not on file    Attends religious service: Not on file    Active member of club or organization: Not on file    Attends meetings of clubs or organizations: Not on file    Relationship status: Not on file  . Intimate partner violence    Fear of current or ex partner: Not on file    Emotionally abused: Not on file    Physically abused: Not on file    Forced sexual activity: Not on file  Other Topics Concern  . Not on file  Social History Narrative  . Not on file     Prior to Admission medications   Medication Sig Start Date End Date Taking? Authorizing Provider  Aspirin-Acetaminophen-Caffeine (GOODYS EXTRA STRENGTH) [phone] MG PACK Take 2 packets by mouth 3 (three) times daily as needed. pain   Yes [provider]    Current Facility-Administered Medications  Medication Dose Route Frequency Provider Last Rate Last Dose  . acetaminophen (TYLENOL) tablet 650 mg  650 mg Oral Q6H PRN Chundi, Vahini, MD       Or  . acetaminophen (TYLENOL) suppository 650 mg  650 mg Rectal Q6H PRN Chundi, Vahini, MD      . ceFEPIme (MAXIPIME) 2 g in sodium chloride 0.9 % 100 mL IVPB  2 g Intravenous Q8H Carmin Muskrat, MD      . enoxaparin (LOVENOX) injection 40 mg  40 mg Subcutaneous Q24H Chundi, Vahini, MD      . promethazine (PHENERGAN) tablet 12.5 mg  12.5 mg Oral Q6H PRN Chundi, Vahini, MD      . senna-docusate (Senokot-S) tablet 1 tablet  1 tablet Oral QHS PRN Chundi, Verne Spurr, MD      . Derrill Memo ON 11/27/2018] vancomycin (VANCOCIN) 1,250 mg in sodium chloride 0.9 % 250 mL IVPB  1,250 mg Intravenous Q12H Carmin Muskrat, MD      . vancomycin (VANCOCIN) 1,500 mg in sodium chloride 0.9 % 500 mL IVPB  1,500 mg Intravenous Once Carmin Muskrat, MD 250 mL/hr at 11/26/18 1536 1,500 mg at 11/26/18 1536   Current Outpatient Medications  Medication Sig Dispense Refill  . Aspirin-Acetaminophen-Caffeine (GOODYS EXTRA STRENGTH) 500-325-65 MG PACK Take 2 packets by mouth 3 (three) times daily as needed. pain      Allergies  Allergen Reactions  . Penicillins Other (See Comments)    Has patient had a PCN reaction causing immediate rash, facial/tongue/throat swelling, SOB or lightheadedness with hypotension: childhood allergy Has patient had a PCN reaction causing severe rash involving mucus membranes or skin necrosis: childhood allergy Has patient had a PCN reaction that required hospitalization childhood allergy Has patient had a PCN reaction occurring within the  last 10 years: childhood allergy If all of the above answers are "NO", then may proceed with Cephalosporin use.        Review of Systems:   General:  decreased appetite, decreased energy, no weight gain, no weight loss, + fever  Cardiac:  no chest pain with exertion, no chest pain at rest, +SOB with exertion, no resting SOB, no PND, no orthopnea, no palpitations, no arrhythmia,  no atrial fibrillation, no LE edema, no dizzy spells, no syncope  Respiratory:  + shortness of breath, no home oxygen, + productive cough, + dry cough, no bronchitis, no wheezing, no hemoptysis, no asthma, + pain with inspiration or cough, no sleep apnea, no CPAP at night  GI:   no difficulty swallowing, no reflux, no frequent heartburn, no hiatal hernia, no abdominal pain, no constipation, no diarrhea, no hematochezia, no hematemesis, no melena  GU:   no dysuria,  no frequency, otory ofn urinary tract infection, no hematuria, no enlarged prostate, no kidney stones, no kidney disease  Vascular:  no pain suggestive of claudication, no pain in feet, no leg cramps, no varicose  painveins, no DVT, no non-healing foot ulcer  Neuro:   no stroke, no TIA's, no seizures, no headaches, no temporary blindness one eye,  no slurred speech, no peripheral neuropathy, no chronic pain, no instability of gait, no memory/cognitive dysfunction  Musculoskeletal: no arthritis, no joint swelling, no myalgias, no difficulty walking, normal mobility   Skin:   no rash, no itching, no skin infections, no pressure sores or ulcerations  Psych:   no anxiety, no depression, no nervousness, no unusual recent stress  Eyes:   no blurry vision, no floaters, no recent vision changes, does not wear glasses or contacts  ENT:   no hearing loss, + loose or painful teeth, no dentures, last saw dentist many years ago  Hematologic:  no easy bruising, no abnormal bleeding, no clotting disorder, no frequent epistaxis  Endocrine:  no diabetes, does not check CBG's  at home     Physical Exam:   BP 131/84   Pulse (!) 117   Temp 98.6 F (37 C) (Oral)   Resp (!) 25   Ht 5\' 8"  (1.727 m)   Wt 72.6 kg   SpO2 94%   BMI 24.33 kg/m   General:  ill-appearing male breathing comfortably in NAD  HEENT:  Unremarkable other than poor dentition  Neck:   no JVD, no bruits, no adenopathy   Chest:   Decreased breath sounds on right, few scattered rhonchi  CV:   tachycardic, no murmur   Abdomen:  soft, non-tender, no masses   Extremities:  warm, well-perfused, pulses diminished, no lower extremity edema  Rectal/GU  Deferred  Neuro:   Grossly non-focal and symmetrical throughout  Skin:   Clean and dry, no rashes, no breakdown  Diagnostic Tests:  Lab Results: Recent Labs    11/26/18 1053  WBC 19.1*  HGB 10.1*  HCT 33.5*  PLT 747*   BMET:  Recent Labs    11/26/18 1053  NA 128*  K 4.8  CL 92*  CO2 23  GLUCOSE 124*  BUN 6  CREATININE 0.68  CALCIUM 8.4*    CBG (last 3)  No results for input(s): GLUCAP in the last 72 hours. PT/INR:   Recent Labs    11/26/18 1053  LABPROT 15.1  INR 1.2    CXR:  PORTABLE CHEST 1 VIEW  COMPARISON:  01/31/2017  FINDINGS: Cardiomediastinal silhouette is mildly enlarged. Calcific atherosclerotic disease of the aorta.  Moderate in size right pleural effusion. Bilateral lower lobe atelectasis versus peribronchial airspace consolidation.  Osseous structures are without acute abnormality. Soft tissues are grossly normal.  IMPRESSION: 1. Moderate in size right pleural effusion. 2. Bilateral lower lobe atelectasis versus peribronchial airspace consolidation. 3. Enlarged cardiac silhouette with calcific atherosclerotic disease of the aorta.   Electronically Signed   By: Linwood Dibbles.D.  On: 11/26/2018 12:00   CT CHEST, ABDOMEN, AND PELVIS WITH CONTRAST  TECHNIQUE: Multidetector CT imaging of the chest, abdomen and pelvis was performed following the standard protocol during  bolus administration of intravenous contrast.  CONTRAST:  176mL OMNIPAQUE IOHEXOL 300 MG/ML  SOLN  COMPARISON:  11/26/2018 and 01/31/2017 chest radiographs. 07/12/2014 chest CT. 03/08/2012 abdominal ultrasound.  FINDINGS: CT CHEST FINDINGS  Cardiovascular: Cardiomegaly again noted. Coronary artery and thoracic aortic atherosclerotic calcifications again noted. There is no evidence of thoracic aortic aneurysm or pericardial effusion.  Mediastinum/Nodes: A 1.2 cm high RIGHT paratracheal lymph node is identified (series 3: Image 10). Other shotty mediastinal and hilar lymph nodes are identified.  Lungs/Pleura: Heterogeneous low-attenuation of majority of the RIGHT middle lobe is identified, measuring up to 7 x 10.5 cm and greatest transverse diameter. There is possible narrowing of some RIGHT middle lobe bronchi.  RIGHT LOWER lobe atelectasis is noted.  Moderate RIGHT pleural effusion is loculated with most prominent loculations as follows:  A 3.2 x 6.6 cm loculation anteriorly along the mid RIGHT chest  A 5.8 x 6.8 cm loculation posteriorly along the mid RIGHT chest  A 5.2 x 8.6 cm x 12 cm loculation along the posterior LOWER RIGHT lung.  Mild pleural enhancement is noted.  Mild consolidation/atelectasis in the LEFT lung base noted.  No pneumothorax.  Musculoskeletal: No acute or suspicious bony abnormalities noted.  CT ABDOMEN PELVIS FINDINGS  Hepatobiliary: The liver is unremarkable. Cholelithiasis noted without CT evidence of acute cholecystitis. No biliary dilatation.  Pancreas: Unremarkable  Spleen: Unremarkable  Adrenals/Urinary Tract: Kidneys, adrenal glands and bladder are unremarkable.  Stomach/Bowel: Stomach is within normal limits. Appendix appears normal. No evidence of bowel wall thickening, distention, or inflammatory changes.  Vascular/Lymphatic: Aortic atherosclerosis. No enlarged abdominal or pelvic lymph nodes.   Reproductive: Prostate is unremarkable.  Other: No ascites, focal collection or pneumoperitoneum.  Musculoskeletal: No acute or significant osseous findings.  IMPRESSION: 1. Extensive hypodense heterogeneity of the RIGHT middle lobe with moderate loculated RIGHT pleural effusion as discussed. This may represent extensive RIGHT middle lobe infection with empyema, but malignancy is difficult to entirely exclude. Borderline high RIGHT paratracheal lymph node. 2. LEFT basilar atelectasis versus consolidation. 3. No acute abnormality within the abdomen or pelvis. 4. Cardiomegaly and coronary artery disease. 5.  Aortic Atherosclerosis (ICD10-I70.0).   Electronically Signed   By: Margarette Canada M.D.   On: 11/26/2018 15:14   EKG: Sinus tach w/out acute ischemic changes       Impression:  Patient has community-acquired pneumonia complicated by right-sided empyema.  Possibility of lung abscess cannot be completely ruled out.  I do not think this is primary lung cancer, although partial airway obstruction or aspiration cannot be ruled out.  Although he is not in shock, he is tachycardic with elevated white blood count.  Circumstances are complicated by the presence of longstanding tobacco and alcohol abuse and likely significant underlying COPD.      Plan:  I feel the best course of action is to proceed directly to the operating room for right video-assisted thoracoscopy for drainage of empyema.  Will also perform bronchoscopy for airway exam and endobronchial lavage.  Broad-spectrum antibiotics have been initiated.  We will ask the medical team to assist with postoperative care if the patient develops signs of alcohol withdrawal and or other significant medical complications.  I have discussed the indications, risk, and potential benefits of surgery with the patient in the emergency department this afternoon.  Alternative treatment strategies have  been discussed.  The patient  understands and accepts all potential associated risks of surgery including but not limited to risk of death, stroke, myocardial infarction, aspiration pneumonia, respiratory failure requiring prolonged mechanical ventilation, prolonged chest wall discomfort, prolonged air leak, recurrent pneumonia or empyema, arrhythmia, bleeding requiring blood transfusion.  All questions answered.  I contacted the patient's close friend Ms. [name] over the telephone and appraised her of our plans.  All questions answered.   I spent in excess of 60 minutes during the conduct of this hospital consultation and >50% of this time involved direct face-to-face encounter for counseling and/or coordination of the patient's care.    Valentina Gu. Roxy Manns, MD 11/26/2018 4:22 PM

## 2018-11-26 NOTE — Anesthesia Procedure Notes (Signed)
Procedure Name: Intubation Date/Time: 11/26/2018 5:46 PM Performed by: Suzy Bouchard, CRNA Pre-anesthesia Checklist: Patient identified, Emergency Drugs available, Suction available, Patient being monitored and Timeout performed Patient Re-evaluated:Patient Re-evaluated prior to induction Oxygen Delivery Method: Circle system utilized Preoxygenation: Pre-oxygenation with 100% oxygen Induction Type: IV induction and Rapid sequence Laryngoscope Size: Miller and 2 Grade View: Grade I Tube type: Oral Tube size: 8.5 mm Number of attempts: 1 Airway Equipment and Method: Stylet Placement Confirmation: ETT inserted through vocal cords under direct vision,  positive ETCO2 and breath sounds checked- equal and bilateral Secured at: 22 cm Tube secured with: Tape Dental Injury: Teeth and Oropharynx as per pre-operative assessment

## 2018-11-26 NOTE — H&P (Addendum)
Date: 11/26/2018               Patient Name:  [name] MRN: [id number]  DOB: Apr 27, 1961 Age / Sex: 58 y.o., male   PCP: Default, Provider, MD         Medical Service: Internal Medicine Teaching Service         Attending Physician: Dr. Aldine Contes, MD    First Contact: Dr. Myrtie Hawk Pager: [phone]  Second Contact: Dr. Maricela Bo Pager: [phone]       After Hours (After 5p/  First Contact Pager: [phone]  weekends / holidays): Second Contact Pager: [phone]   Chief Complaint: Dyspnea  History of Present Illness:  [name] is a 58 y.o m with alcohol use disorder, tobacco use disorder who presented with a 2-3 week history of generalized feeling of being unwell (nausea, chills). For past 3-4 days he has been having productive cough bringing up green sputum. He took over the counter cold medication which did not alleviate symptoms.  Yesterday, he was moving his couch and noted a right upper abdomen/right lower chest pain and worsening shortness of breath.   Denies fevers, vomiting, changes in appetite, chest pain, sick contacts, recent travel.   In the ED, the patient was tachycardic (120-160s), febrile to 102, tachypneic 20s-30s, mildly hypertensive (130-150/70-90s).  Labs significant for lactic acid 1.8, WBC 19.1, BNP 166. Patient was given vancomycin and cefepime, IV fluids.  Meds:  Current Meds  Medication Sig  . Aspirin-Acetaminophen-Caffeine (GOODYS EXTRA STRENGTH) 500-325-65 MG PACK Take 2 packets by mouth 3 (three) times daily as needed. pain   Allergies: Allergies as of 11/26/2018 - Review Complete 11/26/2018  Allergen Reaction Noted  . Penicillins Other (See Comments) 03/08/2012   Past Medical History:  Diagnosis Date  . Alcoholic ([place])   . Back pain   . COPD (chronic obstructive pulmonary disease) ([place])   . Heavy smoker   . Hep C w/ coma, chronic ([place])   . Loose, teeth   . Pancreatitis     Family History:   No significant family history  Social  History:  Currently lives in [place] with wife Works as a Curator  Smokes 1.5ppd, 30 yrs Drinks 12 beers per day with hx of withdrawals, no seizures  No recent drug use  Review of Systems: A complete ROS was negative except as per HPI.  Physical Exam: Blood pressure 131/84, pulse (!) 117, temperature 98.6 F (37 C), temperature source Oral, resp. rate (!) 25, height 5\' 8"  (1.727 m), weight 72.6 kg, SpO2 94 %.  Physical Exam  Constitutional: He is oriented to person, place, and time. He appears well-developed and well-nourished.  Sickly appearing  HENT:  Head: Normocephalic and atraumatic.  Eyes: Conjunctivae are normal.  Cardiovascular: Normal rate, regular rhythm and normal heart sounds.  Respiratory: Tachypnea noted. He is in respiratory distress. He has decreased breath sounds in the right upper field, the right middle field, the left middle field and the left lower field.  Egophony appreciated in right middle lobe, no tactile fremitus  GI: Soft. Bowel sounds are normal. He exhibits no distension. There is abdominal tenderness (mild in right upper quadrant).  Musculoskeletal:        General: No edema.  Neurological: He is alert and oriented to person, place, and time.  Skin: He is diaphoretic.  Psychiatric: He has a normal mood and affect. His behavior is normal. Thought content normal.   EKG: personally reviewed my interpretation is sinus tachycardia  CXR: personally reviewed my interpretation is right middle lobe consolidation  Assessment & Plan by Problem:  [name] is a 58 y.o m with aud, tobacco use disorder who presented with a 2-3 week history of dyspnea. Being treated for sepsis secondary to pulmonary etiology.   Sepsis secondary to pulmonary etiology Parapneumonic effusion vs empyema vs malignant effusion Leukocytosis of 19.1 with neutrophils 17.5, fever 102, tachypnea, tachycardia. Chest xray showed right middle lobe consolidation, followed by CT chest which showed  loculated right pleural effusion that is concerning for empyema with a right paratracheal lymph node.   Given the patient has alcoholic he is at risk for aspiration causing infection.   Spoke to Dr. Roxy Manns CT surgery who reviewed images and stated that patient likely has empyema and   -continue vancomycin, day 1 -continue cefepime, day 1 -blood cultures 6/20 pending -covid19 negative  -npo for ct surgery procedure  -Follow up with Ct surgery -UA was without any leukocytes or nitrites unlikely urinary infection as well  Right upper abdomen pain   CT abdomen showed aortic atherosclerosis without other significant changes.   Hyponatremia The patient sodium on admission was 128.   -Urine sodium and osmolality  Microcytic anemia Patient's MCV is 74.9, hemoglobin 10.1, RDW 19.3.   -ordered Iron studies (ferritin, iron, tibc, iron saturation)  Elevated ALP ALP mildly elevated at 143.   -repeat cmp 6/21 am  Thrombocytosis Unknown whether reactive or persistent thrombocytosis as last labs were one year ago. Patient has elevated platelets 747 which maybe elevated secondary to infection vs underlying malignancy.   -iron studies ordered  -pathologist reviewed peripheral blood smear   Alcohol use disorder  Drinks 12 beers daily and has had withdrawal symptoms prior. Last etoh use this morning 6/20.  -CIWA with ativan -Librium 25mg  once  -Multivitamins  -Folic acid 1mg  qd  -Thiamine 100mg  qd   Dispo: Admit patient to Observation with expected length of stay less than 2 midnights.  SignedLars Mage, MD 11/26/2018, 4:25 PM  Pager: [phone]

## 2018-11-26 NOTE — ED Notes (Signed)
Admitting providers at bedside. Pager [phone]

## 2018-11-26 NOTE — Brief Op Note (Signed)
11/26/2018  7:28 PM  PATIENT:  [name]  58 y.o. male  PRE-OPERATIVE DIAGNOSIS:  empyema  POST-OPERATIVE DIAGNOSIS:  empyema  PROCEDURE:  Procedure(s): VIDEO ASSISTED THORACOSCOPY (VATS)/EMPYEMA (Right) Video Bronchoscopy  SURGEON:  Surgeon(s) and Role:    * Rexene Alberts, MD - Primary  PHYSICIAN ASSISTANT:    Girtha Rm, Wilder Glade, PA-C   ANESTHESIA:   general  EBL:  100 mL   BLOOD ADMINISTERED:none  DRAINS: 3 Chest Tube(s) in the right pleural space   LOCAL MEDICATIONS USED:  NONE  SPECIMEN:  Source of Specimen:  right pleural peel and pleural fluid  DISPOSITION OF SPECIMEN:  PATHOLOGY  COUNTS:  YES  TOURNIQUET:  * No tourniquets in log *  DICTATION: .Note written in EPIC  PLAN OF CARE: Admit to inpatient   PATIENT DISPOSITION:  PACU - hemodynamically stable.   Delay start of Pharmacological VTE agent (>24hrs) due to surgical blood loss or risk of bleeding: yes  Rexene Alberts, MD 11/26/2018 7:29 PM

## 2018-11-26 NOTE — ED Notes (Signed)
Patient transported to CT 

## 2018-11-26 NOTE — ED Notes (Signed)
Procedure Consent and Blood Consent Signed

## 2018-11-26 NOTE — Progress Notes (Signed)
Pharmacy Antibiotic Note  [name] is a 58 y.o. male admitted on 11/26/2018 with empiric - unknown source.  Pharmacy has been consulted for vancomycin and cefepime dosing.  Orders originally for aztreonam d/t PCN childhood allergy. Noted received amoxicillin and cefazolin in 2016 and tolerated. Will adjust aztreonam to cefepime.   Presenting with 3 day R flank pain, fever, cough x2 days. WBC 19.1, LA 1.8, temp 102, Scr 0.68 (CrCl 99 mL/min).   Plan: Cefepime 2 g IV every 8 hours  Vancomycin 1500 mg IV once then 1250 mg IV every 12 hours Monitor renal fx, cx pic, clinical pic, and vanc level as appropriate  Height: 5\' 8"  (172.7 cm) Weight: 160 lb (72.6 kg) IBW/kg (Calculated) : 68.4  Temp (24hrs), Avg:102 F (38.9 C), Min:102 F (38.9 C), Max:102 F (38.9 C)  Recent Labs  Lab 11/26/18 1053  WBC 19.1*  CREATININE 0.68  LATICACIDVEN 1.8    Estimated Creatinine Clearance: 99.8 mL/min (by C-G formula based on SCr of 0.68 mg/dL).    Allergies  Allergen Reactions  . Penicillins Other (See Comments)    Has patient had a PCN reaction causing immediate rash, facial/tongue/throat swelling, SOB or lightheadedness with hypotension: childhood allergy Has patient had a PCN reaction causing severe rash involving mucus membranes or skin necrosis: childhood allergy Has patient had a PCN reaction that required hospitalization childhood allergy Has patient had a PCN reaction occurring within the last 10 years: childhood allergy If all of the above answers are "NO", then may proceed with Cephalosporin use.      Antimicrobials this admission: Vanc 6/20 >>  Cefepime 6/20 >>   Dose adjustments this admission: N/A  Microbiology results: 6/20 BCx: sent 6/20 COVID PCR: neg  Thank you for allowing pharmacy to be a part of this patient's care.  Antonietta Jewel, PharmD, [facility] Clinical Pharmacist  Pager: [phone] Phone: [phone] 11/26/2018 1:02 PM

## 2018-11-26 NOTE — ED Notes (Signed)
Unit secretary and this RN assisted pt with taking a phone call.

## 2018-11-26 NOTE — ED Notes (Signed)
Dr. Owen at bedside

## 2018-11-26 NOTE — Transfer of Care (Signed)
Immediate Anesthesia Transfer of Care Note  Patient: [name]  Procedure(s) Performed: VIDEO ASSISTED THORACOSCOPY (VATS)/EMPYEMA (Right Chest) Video Bronchoscopy  Patient Location: PACU  Anesthesia Type:General  Level of Consciousness: awake  Airway & Oxygen Therapy: Patient Spontanous Breathing and Patient connected to face mask oxygen  Post-op Assessment: Report given to RN, Post -op Vital signs reviewed and stable and Patient moving all extremities X 4  Post vital signs: Reviewed and stable  Last Vitals:  Vitals Value Taken Time  BP 132/80 11/26/18 1947  Temp 37.1 C 11/26/18 1945  Pulse 112 11/26/18 1952  Resp 30 11/26/18 1952  SpO2 93 % 11/26/18 1952  Vitals shown include unvalidated device data.  Last Pain:  Vitals:   11/26/18 1347  TempSrc: Oral  PainSc:          Complications: No apparent anesthesia complications

## 2018-11-26 NOTE — Anesthesia Procedure Notes (Signed)
Procedure Name: Intubation Date/Time: 11/26/2018 6:05 PM Performed by: Suzy Bouchard, CRNA Pre-anesthesia Checklist: Patient identified, Emergency Drugs available, Suction available, Patient being monitored and Timeout performed Patient Re-evaluated:Patient Re-evaluated prior to induction Oxygen Delivery Method: Circle system utilized Preoxygenation: Pre-oxygenation with 100% oxygen Induction Type: Inhalational induction with existing ETT Laryngoscope Size: Miller and 2 Grade View: Grade I Endobronchial tube: Left, EBT position confirmed by auscultation, Double lumen EBT and EBT position confirmed by fiberoptic bronchoscope and 39 Fr Number of attempts: 1 Airway Equipment and Method: Stylet Placement Confirmation: ETT inserted through vocal cords under direct vision,  positive ETCO2 and breath sounds checked- equal and bilateral Tube secured with: Tape Dental Injury: Teeth and Oropharynx as per pre-operative assessment

## 2018-11-26 NOTE — ED Notes (Signed)
Pt transported to [facility] Short Stay

## 2018-11-26 NOTE — ED Triage Notes (Signed)
Patient arrived from home with 3 days of right flank pain with nausea and vomiting. After moving furniture yesterday developed SOB with exertion. Arrived with HR 160s, fever 102, and initial sats 85 at home on RA. Patient also reports congested cough x 2 days. Arrived on NRBM, alert and oriented, received fentanyl 100 prior to arrival.

## 2018-11-26 NOTE — Progress Notes (Signed)
Patient arrived to Short Stay with ER RN. Patient dyspneic on 2 lpm O2, Sinus tach on monitor 118, SpO2 94% on 2 lpm. Increased O2 to 4 lpm with improvement in saturations and slight decrease in work of breathing. Belongings will be taken to PACU. Anesthesia at bedside to prepare for surgery. CHG bath not given due to patient condition and urgency of surgery (surgeon requesting patient be taken back as soon as anesthesia has appropriate lines).

## 2018-11-26 NOTE — Anesthesia Preprocedure Evaluation (Addendum)
Anesthesia Evaluation  Patient identified by MRN, date of birth, ID band Patient awake    Reviewed: Allergy & Precautions, H&P , NPO status , Patient's Chart, lab work & pertinent test results  Airway Mallampati: II   Neck ROM: full    Dental  (+) Poor Dentition, Dental Advisory Given, Chipped,    Pulmonary pneumonia, COPD, Current Smoker,    breath sounds clear to auscultation       Cardiovascular negative cardio ROS   Rhythm:regular Rate:Normal     Neuro/Psych  Neuromuscular disease    GI/Hepatic (+)     substance abuse  alcohol use, Hepatitis -, C  Endo/Other    Renal/GU      Musculoskeletal   Abdominal   Peds  Hematology   Anesthesia Other Findings   Reproductive/Obstetrics                            Anesthesia Physical Anesthesia Plan  ASA: III  Anesthesia Plan: General   Post-op Pain Management:    Induction: Intravenous  PONV Risk Score and Plan: 1 and Ondansetron, Dexamethasone, Midazolam and Treatment may vary due to age or medical condition  Airway Management Planned: Double Lumen EBT  Additional Equipment: Arterial line  Intra-op Plan:   Post-operative Plan:   Informed Consent: I have reviewed the patients History and Physical, chart, labs and discussed the procedure including the risks, benefits and alternatives for the proposed anesthesia with the patient or authorized representative who has indicated his/her understanding and acceptance.       Plan Discussed with: CRNA, Anesthesiologist and Surgeon  Anesthesia Plan Comments:         Anesthesia Quick Evaluation

## 2018-11-26 NOTE — ED Provider Notes (Signed)
[facility] EMERGENCY DEPARTMENT Provider Note   CSN: [id number] Arrival date & time: 11/26/18  1018    History   Chief Complaint No chief complaint on file.   HPI [name] is a 58 y.o. male.     HPI Patient presents with concern of ongoing right-sided pain both in the axilla and upper abdomen. There is associated nausea, vomiting, fever. However, yesterday these concerns were made worse while he was moving furniture, during which she also developed new dyspnea. He states that prior to the onset of all of these concerns he was doing generally well.  When he acknowledges a history of alcohol use, smokes cigarettes regularly, has history of COPD. Since onset no relief with OTC medication. EMS reports the patient was hypoxic, 85% on room air on arrival, but improved with a nonrebreather mask to 98%. Patient was received fentanyl in route, and he notes that this improved his pain somewhat. Past Medical History:  Diagnosis Date  . Alcoholic ([place])   . Back pain   . COPD (chronic obstructive pulmonary disease) ([place])   . Heavy smoker   . Hep C w/ coma, chronic ([place])   . Loose, teeth   . Pancreatitis     Patient Active Problem List   Diagnosis Date Noted  . Radiculopathy due to lumbar intervertebral disc disorder 12/29/2017  . Chronic left-sided low back pain with left-sided sciatica 11/30/2017  . 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 ARTERY REPAIR  2013   neck  . KNEE SURGERY  2013   rt  . MICROLARYNGOSCOPY N/A 07/31/2014   Procedure: MICROLARYNGOSCOPY WITH BIOPSY ;  Surgeon: Rozetta Nunnery, MD;  Location: [facility];  Service: ENT;  Laterality: N/A;  . NECK SURGERY  2013   cervical fusion-        Home Medications    Prior to Admission medications   Not on File    Family History No family history on file.  Social  History Social History   Tobacco Use  . Smoking status: Current Every Day Smoker    Packs/day: 2.00    Types: Cigarettes  . Smokeless tobacco: Never Used  Substance Use Topics  . Alcohol use: Yes    Comment: alcoholic (4 20'U a day). 12 pck a day  . Drug use: No     Allergies   Penicillins   Review of Systems Review of Systems  Constitutional:       Per HPI, otherwise negative  HENT:       Per HPI, otherwise negative  Respiratory:       Per HPI, otherwise negative  Cardiovascular:       Per HPI, otherwise negative  Gastrointestinal: Positive for nausea and vomiting.  Endocrine:       Negative aside from HPI  Genitourinary:       Neg aside from HPI   Musculoskeletal:       Per HPI, otherwise negative  Skin: Negative.   Neurological: Positive for weakness. Negative for syncope.     Physical Exam Updated Vital Signs BP 137/82   Pulse (!) 130   Temp (!) 102 F (38.9 C) (Oral)   Resp (!) 26   Ht 5\' 8"  (1.727 m)   Wt 72.6 kg   SpO2 96%   BMI 24.33 kg/m   Physical Exam Vitals signs and nursing note reviewed.  Constitutional:  General: He is not in acute distress.    Appearance: He is ill-appearing and diaphoretic.  HENT:     Head: Normocephalic and atraumatic.  Eyes:     Conjunctiva/sclera: Conjunctivae normal.  Cardiovascular:     Rate and Rhythm: Regular rhythm. Tachycardia present.  Pulmonary:     Effort: Pulmonary effort is normal. Tachypnea present.     Breath sounds: Decreased breath sounds present. No wheezing.  Abdominal:     General: There is no distension.  Skin:    General: Skin is warm.  Neurological:     Mental Status: He is alert and oriented to person, place, and time.      ED Treatments / Results  Labs (all labs ordered are listed, but only abnormal results are displayed) Labs Reviewed  COMPREHENSIVE METABOLIC PANEL - Abnormal; Notable for the following components:      Result Value   Sodium 128 (*)    Chloride 92 (*)     Glucose, Bld 124 (*)    Calcium 8.4 (*)    Albumin 1.9 (*)    Alkaline Phosphatase 143 (*)    All other components within normal limits  CBC WITH DIFFERENTIAL/PLATELET - Abnormal; Notable for the following components:   WBC 19.1 (*)    Hemoglobin 10.1 (*)    HCT 33.5 (*)    MCV 74.9 (*)    MCH 22.6 (*)    RDW 19.3 (*)    Platelets 747 (*)    Neutro Abs 17.5 (*)    Lymphs Abs 0.3 (*)    Abs Immature Granulocytes 0.12 (*)    All other components within normal limits  BRAIN NATRIURETIC PEPTIDE - Abnormal; Notable for the following components:   B Natriuretic Peptide 166.6 (*)    All other components within normal limits  SARS CORONAVIRUS 2 (HOSPITAL ORDER, [facility])  CULTURE, BLOOD (ROUTINE X 2)  CULTURE, BLOOD (ROUTINE X 2)  URINALYSIS, ROUTINE W REFLEX MICROSCOPIC  LACTIC ACID, PLASMA  PROTIME-INR    EKG EKG Interpretation  Date/Time:  Saturday November 26 2018 10:26:16 EDT Ventricular Rate:  162 PR Interval:    QRS Duration: 80 QT Interval:  332 QTC Calculation: 546 R Axis:   -100 Text Interpretation:  Sinus tachycardia Anterior infarct, old Prolonged QT interval Abnormal ECG Confirmed by Carmin Muskrat [phone]) on 11/26/2018 11:04:41 AM    EKG #2 sinus tachycardia, rate 145 otherwise similar to prior, abnormal.  Radiology Dg Chest Port 1 View  Result Date: 11/26/2018 CLINICAL DATA:  Right flank pain, nausea vomiting. EXAM: PORTABLE CHEST 1 VIEW COMPARISON:  01/31/2017 FINDINGS: Cardiomediastinal silhouette is mildly enlarged. Calcific atherosclerotic disease of the aorta. Moderate in size right pleural effusion. Bilateral lower lobe atelectasis versus peribronchial airspace consolidation. Osseous structures are without acute abnormality. Soft tissues are grossly normal. IMPRESSION: 1. Moderate in size right pleural effusion. 2. Bilateral lower lobe atelectasis versus peribronchial airspace consolidation. 3. Enlarged cardiac silhouette with  calcific atherosclerotic disease of the aorta. Electronically Signed   By: Fidela Salisbury M.D.   On: 11/26/2018 12:00    Procedures Procedures (including critical care time)  Medications Ordered in ED Medications  ceFEPIme (MAXIPIME) 2 g in sodium chloride 0.9 % 100 mL IVPB (has no administration in time range)  vancomycin (VANCOCIN) 1,500 mg in sodium chloride 0.9 % 500 mL IVPB (has no administration in time range)  ceFEPIme (MAXIPIME) 2 g in sodium chloride 0.9 % 100 mL IVPB (has no administration in time range)  vancomycin (VANCOCIN) 1,250 mg in sodium chloride 0.9 % 250 mL IVPB (has no administration in time range)  sodium chloride 0.9 % bolus 1,000 mL (0 mLs Intravenous Stopped 11/26/18 1302)  acetaminophen (TYLENOL) tablet 1,000 mg (1,000 mg Oral Given 11/26/18 1135)     Initial Impression / Assessment and Plan / ED Course  I have reviewed the triage vital signs and the nursing notes.  Pertinent labs & imaging results that were available during my care of the patient were reviewed by me and considered in my medical decision making (see chart for details).    With initial tachypnea, tachycardia, hypoxia, the patient continue to use supplemental oxygen, broad differential considered including sepsis, pneumonia, COVID, hepatobiliary dysfunction.    1:24 PM Patient appears more comfortable, states that he feels somewhat better, though he has persistent pain, and now it is more comfortable, he cannot specify the ribs below the right infracostal margin. With his additional complaints of nausea, vomiting, CT scan is pending. However, thus far, patient's evaluation is concerning for pneumonia, leukocytosis, bilateral opacification on x-ray, and given his initial tachypnea, tachycardia, fever, he meets sirs criteria, though no early evidence for septic shock.  Blood pressure has remained unremarkable, no indication for copious fluid resuscitation. Patient has received broad-spectrum  antibiotics after I discussed his case with our pharmacy colleagues, fluids, and heart rate has diminished, tachypnea has improved as well. Given these findings, the patient requires admission for further evaluation, monitoring, management.  On admission, CT abdomen pelvis is pending.  COVID result is negative.  Final Clinical Impressions(s) / ED Diagnoses   Final diagnoses:  Sepsis without acute organ dysfunction, due to unspecified organism [facility])    CRITICAL CARE Performed by: Carmin Muskrat Total critical care time: 35 minutes Critical care time was exclusive of separately billable procedures and treating other patients. Critical care was necessary to treat or prevent imminent or life-threatening deterioration. Critical care was time spent personally by me on the following activities: development of treatment plan with patient and/or surrogate as well as nursing, discussions with consultants, evaluation of patient's response to treatment, examination of patient, obtaining history from patient or surrogate, ordering and performing treatments and interventions, ordering and review of laboratory studies, ordering and review of radiographic studies, pulse oximetry and re-evaluation of patient's condition.    Carmin Muskrat, MD 11/26/18 1327

## 2018-11-26 NOTE — Op Note (Signed)
CARDIOTHORACIC SURGERY OPERATIVE NOTE  Date of Procedure:   11/26/2018  Preoperative Diagnosis:  Community - acquired pneumonia with right empyema  Postoperative Diagnosis:  same  Procedure:    Video Bronchoscopy with Endobronchial Lavage  Video-assisted Thoracoscopy for Drainage of Empyema   Surgeon:    Valentina Gu. Roxy Manns, MD  Assistant:    John Giovanni, PA-C  Anesthesia:    Albertha Ghee, MD  Operative Findings:   Acute suppurative empyema     BRIEF CLINICAL NOTE AND INDICATIONS FOR SURGERY  Patient is a 58 year old male with history of COPD, longstanding heavy tobacco abuse and alcohol abuse who presented to the emergency department with a progressive 2-week history of fevers, productive cough, and right-sided pleuritic chest pain and underwent chest x-ray and CT scan which are diagnostic of community-acquired pneumonia with empyema.  The patient has been seen in consultation and counseled at length regarding the indications, risks and potential benefits of surgery.  All questions have been answered, and the patient provides full informed consent for the operation as described.    DETAILS OF THE OPERATIVE PROCEDURE  The patient is brought to the operating room on the above mentioned date And placed in the supine position on the operating table. Intravenous access and radial arterial line are placed by the anesthesia team. Intravenous antibiotics are administered and pneumatic sequential compression boots are placed on both lower extremities. General endotracheal anesthesia is induced uneventfully.  The patient was initially intubated using a single-lumen endotracheal tube.  Flexible fiberoptic video bronchoscopy is performed using a 5 mm bronchoscope passed through the existing endotracheal tube.  The distal trachea, carina, and left and right endobronchial tree are all visualized.  The left side is initially evaluated and is notably entirely normal with minimal secretions.  The  entire right endobronchial tree is widely patent.  There are copious purulent secretions.  All major segmental and subsegmental bronchi on the right side are patent and free of signs of obstruction.  No endobronchial mass lesions are identified.  The right endobronchial tree is lavaged with copious saline.  Portions of endobronchial lavage are trapped and sent for routine culture and cytology.  The patient was re-intubated using a dual lumen endotracheal tube. The patient is turned to the left lateral decubitus position.  An axillary roll is placed and the patient's right chest was prepared and draped in a sterile manner.  A small incision is made overlying the seventh intercostal space and the anterior axillary line. The incision is completed into the pleural space with electrocautery. The pleural space is palpated and a blunt sucker tip is placed into the pleural space to evacuate pleural fluid. A moderate-large of murky purulent and serosanguinous fluid are evacuated and portions of the pleural fluid or trapped to be sent for cytology, culture, and routine laboratory evaluation.  A 10 mm port was passed through the incision and a thoracoscopic camera advanced through the port. The pleural space is examined visually.  Sites for additional ports and a miniature incision are identified.  A small incision is made directly laterally overlying the fifth intercostal space.  2 additional port incisions are placed anteriorly.  Through these ports and working incisions the remainder of the procedure is completed.  There is acute suppurative empyema with multiple loculations and areas of acute inflammation.  Mobilization of the pleural peel is straightforward and without significant fibrosis.  Copious amounts of pleural peel and multiple pockets of fluid are all evacuated.  Specimens of the pleural peel  are sent for histology.  There is severe consolidation of the lung particularly in the right middle lobe.  The lung  is completely mobilized circumferentially without difficulty.  The pleural space is irrigated with copious warm saline solution. The pleural space is drained using 3 chest tubes exited through separate stab incisions including the original port incision. The remaining incision is closed in multiple layers in routine fashion. The 3 chest tubes were fixed to close suction drainage device.  The patient tolerated the procedure well, was extubated in the operative room, and transported to the postanesthesia care in stable condition. All sponge instrument and needle counts are verified correct. There were no intraoperative complications. Estimated blood loss was 100 mL      Clarence H. Roxy Manns MD 11/26/2018 7:30 PM

## 2018-11-27 ENCOUNTER — Inpatient Hospital Stay (HOSPITAL_COMMUNITY): Payer: Medicaid Other

## 2018-11-27 DIAGNOSIS — Z7289 Other problems related to lifestyle: Secondary | ICD-10-CM

## 2018-11-27 DIAGNOSIS — Z720 Tobacco use: Secondary | ICD-10-CM

## 2018-11-27 LAB — HIV ANTIBODY (ROUTINE TESTING W REFLEX): HIV Screen 4th Generation wRfx: NONREACTIVE

## 2018-11-27 LAB — POCT I-STAT 7, (LYTES, BLD GAS, ICA,H+H)
Bicarbonate: 24.5 mmol/L (ref 20.0–28.0)
Calcium, Ion: 1.11 mmol/L — ABNORMAL LOW (ref 1.15–1.40)
HCT: 30 % — ABNORMAL LOW (ref 39.0–52.0)
Hemoglobin: 10.2 g/dL — ABNORMAL LOW (ref 13.0–17.0)
O2 Saturation: 95 %
Patient temperature: 97.7
Potassium: 4.4 mmol/L (ref 3.5–5.1)
Sodium: 134 mmol/L — ABNORMAL LOW (ref 135–145)
TCO2: 26 mmol/L (ref 22–32)
pCO2 arterial: 37 mmHg (ref 32.0–48.0)
pH, Arterial: 7.427 (ref 7.350–7.450)
pO2, Arterial: 71 mmHg — ABNORMAL LOW (ref 83.0–108.0)

## 2018-11-27 LAB — CBC
HCT: 28.1 % — ABNORMAL LOW (ref 39.0–52.0)
Hemoglobin: 8.3 g/dL — ABNORMAL LOW (ref 13.0–17.0)
MCH: 22.7 pg — ABNORMAL LOW (ref 26.0–34.0)
MCHC: 29.5 g/dL — ABNORMAL LOW (ref 30.0–36.0)
MCV: 77 fL — ABNORMAL LOW (ref 80.0–100.0)
Platelets: 574 10*3/uL — ABNORMAL HIGH (ref 150–400)
RBC: 3.65 MIL/uL — ABNORMAL LOW (ref 4.22–5.81)
RDW: 19.9 % — ABNORMAL HIGH (ref 11.5–15.5)
WBC: 21.4 10*3/uL — ABNORMAL HIGH (ref 4.0–10.5)
nRBC: 0 % (ref 0.0–0.2)

## 2018-11-27 LAB — COMPREHENSIVE METABOLIC PANEL
ALT: 15 U/L (ref 0–44)
AST: 17 U/L (ref 15–41)
Albumin: 1.4 g/dL — ABNORMAL LOW (ref 3.5–5.0)
Alkaline Phosphatase: 92 U/L (ref 38–126)
Anion gap: 8 (ref 5–15)
BUN: 9 mg/dL (ref 6–20)
CO2: 24 mmol/L (ref 22–32)
Calcium: 8 mg/dL — ABNORMAL LOW (ref 8.9–10.3)
Chloride: 101 mmol/L (ref 98–111)
Creatinine, Ser: 0.56 mg/dL — ABNORMAL LOW (ref 0.61–1.24)
GFR calc Af Amer: >60 mL/min (ref >60)
GFR calc non Af Amer: >60 mL/min (ref >60)
Glucose, Bld: 153 mg/dL — ABNORMAL HIGH (ref 70–99)
Potassium: 4.4 mmol/L (ref 3.5–5.1)
Sodium: 133 mmol/L — ABNORMAL LOW (ref 135–145)
Total Bilirubin: 0.2 mg/dL — ABNORMAL LOW (ref 0.3–1.2)
Total Protein: 5.6 g/dL — ABNORMAL LOW (ref 6.5–8.1)

## 2018-11-27 LAB — PREALBUMIN: Prealbumin: <5 mg/dL — ABNORMAL LOW (ref 18–38)

## 2018-11-27 LAB — IRON AND TIBC
Iron: 6 ug/dL — ABNORMAL LOW (ref 45–182)
Saturation Ratios: 3 % — ABNORMAL LOW (ref 17.9–39.5)
TIBC: 204 ug/dL — ABNORMAL LOW (ref 250–450)
UIBC: 198 ug/dL

## 2018-11-27 LAB — MRSA PCR SCREENING: MRSA by PCR: NEGATIVE

## 2018-11-27 LAB — FERRITIN: Ferritin: 105 ng/mL (ref 24–336)

## 2018-11-27 MED ORDER — CHLORDIAZEPOXIDE HCL 5 MG PO CAPS
25.0000 mg | ORAL_CAPSULE | Freq: Every day | ORAL | Status: DC
  Administered 2018-11-27 – 2018-11-28 (×2): 25 mg via ORAL
  Filled 2018-11-27 (×2): qty 5

## 2018-11-27 MED ORDER — VITAMIN B-1 100 MG PO TABS: 100.0000 mg | ORAL_TABLET | Freq: Every day | ORAL | Status: DC

## 2018-11-27 MED ORDER — THIAMINE HCL 100 MG/ML IJ SOLN
100.0000 mg | Freq: Every day | INTRAMUSCULAR | Status: DC
  Administered 2018-11-27 – 2018-11-30 (×4): 100 mg via INTRAVENOUS
  Filled 2018-11-27 (×4): qty 2

## 2018-11-27 MED ORDER — VANCOMYCIN HCL IN DEXTROSE 1-5 GM/200ML-% IV SOLN: 1000.0000 mg | INTRAVENOUS | Status: DC

## 2018-11-27 MED ORDER — NICOTINE 21 MG/24HR TD PT24
21.0000 mg | MEDICATED_PATCH | Freq: Every day | TRANSDERMAL | Status: DC
  Administered 2018-11-27 – 2018-12-04 (×8): 21 mg via TRANSDERMAL
  Filled 2018-11-27 (×8): qty 1

## 2018-11-27 NOTE — Progress Notes (Signed)
Date: 11/27/2018  Patient name: [name]  Medical record number: [id number]  Date of birth: [birth year]   I have seen and evaluated [name] and discussed their care with the Residency Team.  In brief, patient is a 58 year old male with past medical history of alcohol use disorder, tobacco use disorder who presented to the ED with worsening shortness of breath for the last 2 days.  Patient states that approximately 2 to 3 weeks ago he started feeling "unwell".  Patient complains of chills and nausea but denied any vomiting.  No abdominal pain, no palpitations, no diaphoresis, no fevers or chills.  For the last 3 to 4 days prior to admission he noted a productive cough with greenish phlegm and some associated shortness of breath for the 2 days prior to his admission.  Patient tried some over-the-counter cold medications did not help.  On the day prior to his admission he was moving a couch and noted right upper quadrant and right lower chest pain as well as worsening shortness of breath.  He came to the ED for further evaluation.  No headache, no blurry vision, no focal weakness, no tingling or numbness, no lightheadedness, no syncope, no sick contacts.  Today patient states that he feels better and that his shortness of breath is improving.    PMHx, Fam Hx, and/or Soc Hx : As per resident admit note  Vitals:   11/27/18 1100 11/27/18 1132  BP: 129/72   Pulse:    Resp: (!) 25   Temp:  98.8 F (37.1 C)  SpO2:     Physical Exam  Constitutional: He is oriented to person, place, and time and well-developed, well-nourished, and in no distress.  HENT:  Head: Normocephalic and atraumatic.  Eyes: Right eye exhibits no discharge. Left eye exhibits no discharge.  Cardiovascular: Normal rate, regular rhythm and normal heart sounds.  Pulmonary/Chest: Effort normal. He has no wheezes. He has no rales.  Decreased breath sounds in right lower base   Abdominal: Soft. Bowel sounds are normal.  He exhibits no distension. There is no abdominal tenderness.  Musculoskeletal:        General: No tenderness or edema.  Neurological: He is alert and oriented to person, place, and time.  Skin: Skin is warm and dry.  Psychiatric: Mood and affect normal.    Assessment and Plan: I have seen and evaluated the patient as outlined above. I agree with the formulated Assessment and Plan as detailed in the residents' note, with the following changes:   1.  Community-acquired pneumonia with right-sided empyema: -Patient presented to the ED with productive cough and worsening shortness of breath and was found to have a right-sided pneumonia as well as a right-sided loculated pleural effusion which is consistent with an empyema.  He was also noted to have leukocytosis and fevers consistent with an underlying infection. -Patient white count is increased slightly to 21 today.  I suspect is secondary to his recent surgery -Cardiothoracic surgery follow-up and recommendations appreciated.  Patient is now status post VATS for right-sided empyema as well as video bronchoscopy. -Continue cefepime and vancomycin for now -Blood cultures with no growth to date -BAL showing gram-positive cocci on Gram stain.  We will follow-up cultures which have no growth to date -Patient also has a history of chronic alcohol use.  He is on Librium 25 mg daily currently but has no signs of alcohol withdrawal.  We will continue to monitor closely.  Patient is on CIWA protocol. -Patient also  had hyponatremia on admission with sodium 128 which is slowly improved to 134 over the last 24 hours.  We will continue to monitor BMP.  I suspect hyponatremia is likely hypovolemic secondary to decreased oral intake -Thrombocytosis likely an acute phase reactant.  We will continue to monitor platelet count.  Aldine Contes, MD 6/21/20201:51 PM

## 2018-11-27 NOTE — Progress Notes (Signed)
Chaplain responded to request for AD; spoke briefly with incoming RN before entering room to deliver materials.  Pt was asleep and despite several attempts, did not wake up.  Chaplain left material on bed table and gave instructions to RN that pt may call [facility] during business hours for help in completing the form. Please contact as needed or requested.  Luana Shu [phone]      11/27/18 1935  Clinical Encounter Type  Visited With Patient not available  Visit Type Initial;Other (Comment) (AD)  Referral From Patient  Consult/Referral To Chaplain  Stress Factors  Patient Stress Factors Health changes  Mental Health Advance Directives  Would patient like information on creating a mental health advance directive? Yes (Inpatient - patient requests chaplain consult to create a mental health advance directive)

## 2018-11-27 NOTE — Progress Notes (Signed)
   Subjective:  Mr. [name] was seen sitting up doing well. He stated that he was breathing better. He did not have any diaphoresis.  Objective:  Vital signs in last 24 hours: Vitals:   11/27/18 0900 11/27/18 1000 11/27/18 1100 11/27/18 1132  BP: 135/84 (!) 145/82 129/72   Pulse:      Resp: (!) 22 (!) 36 (!) 25   Temp:    98.8 F (37.1 C)  TempSrc:    Oral  SpO2:  98%    Weight:      Height:       Physical Exam  Constitutional: He appears well-developed and well-nourished. No distress.  HENT:  Head: Normocephalic and atraumatic.  Eyes: Conjunctivae are normal.  Cardiovascular: Normal rate, regular rhythm and normal heart sounds.  Respiratory: Effort normal. No respiratory distress. He has no wheezes.  Mildly decreased breath sounds in bilateral lower lungs  GI: Soft. Bowel sounds are normal. He exhibits no distension. There is no abdominal tenderness.  Musculoskeletal:        General: No edema.  Neurological: He is alert.  Skin: He is not diaphoretic.  Psychiatric: He has a normal mood and affect. His behavior is normal. Judgment and thought content normal.   Assessment/Plan: Mr. Gallery is a 58 y.o m with aud, tobacco use disorder who presented with a 2-3 week history of dyspnea. Being treated for sepsis secondary to pulmonary etiology.   CAP complicated by acute superative empyema s/p VATS and BAL 6/20, POD 1 Patient appears to be doing much better after his procedure yesterday.  He has been afebrile.  His leukocytosis is slightly elevated to 21 today, which is probably a result of his surgery.  Chest x-ray 6/21 shows stable right basal opacification.  Patient has put out approximately 380 mL of fluid via chest tube.  -Appreciate Dr. Roxy Manns from cardiothoracic surgery following -BAL culture 6/20 growing gram-positive cocci in chains on Gram stain, culture no growth less than 12 hours -surgical pathology pending  -Blood culture 6/20 pending -continue vancomycin, day 2  -continue cefepime, day 2 -Prealbumin <5, albumin 1.4  Right upper abdomen pain   CT abdomen showed aortic atherosclerosis without other significant changes.   Hypotonic hyponatremia The patient sodium on admission was 128.   Urine sodium 49, urine osmolality 291, osmolality 269.  -Fluid resuscitation  Microcytic anemia Patient's MCV is 74.9, hemoglobin 10.1, RDW 19.3.   -ordered Iron studies (ferritin, iron, tibc, iron saturation)  Elevated ALP ALP mildly elevated at 143 on admission, repeat is 92 today 6/21.  Thrombocytosis Platelets 747 on admission now improved to 574.  -iron studies ordered  -Consider pathologist reviewed peripheral blood smear if persists  Alcohol use disorder  Drinks 12 beers daily and has had withdrawal symptoms prior. Last etoh morning 6/20.  Significant other states that he the patient has had several hospitalizations during which he had withdrawals.  -CIWA with ativan -Prophylaxis with Librium 26m daily  -Multivitamins  -Folic acid 154mqd  -Thiamine 10027md   Dispo: Anticipated discharge in approximately 2-3 day(s).   ChuLars MageD 11/27/2018, 1:03 PM Pager: [phone]

## 2018-11-27 NOTE — Progress Notes (Signed)
[address]       [place] [address]             [phone]        CARDIOTHORACIC SURGERY PROGRESS NOTE   R1 Day Post-Op Procedure(s) (LRB): VIDEO ASSISTED THORACOSCOPY (VATS)/EMPYEMA (Right) Video Bronchoscopy  Subjective: Looks good.  No agitation or other signs of withdrawal.  Reports feeling much better than prior to surgery.  Expected soreness but overall pain improved.  Cough improved.  No SOB  Objective: Vital signs: BP Readings from Last 1 Encounters:  11/27/18 117/84   Pulse Readings from Last 1 Encounters:  11/27/18 98   Resp Readings from Last 1 Encounters:  11/27/18 (!) 21   Temp Readings from Last 1 Encounters:  11/27/18 98.2 F (36.8 C) (Oral)    Hemodynamics:    Physical Exam:  Rhythm:   sinus  Breath sounds: Few rhonchi  Heart sounds:  RRR  Incisions:  Dressings dry, intact  Abdomen:  Soft, non-distended, non-tender  Extremities:  Warm, well-perfused  Chest tubes:  low volume thin serosanguinous output, no air leak    Intake/Output from previous day: 06/20 0701 - 06/21 0700 In: 2409.4 [I.V.:1820.2; IV Piggyback:589.2] Out: 0277 [Urine:1195; Blood:100; Chest Tube:310] Intake/Output this shift: Total I/O In: 445.3 [P.O.:360; I.V.:72.9; IV Piggyback:12.4] Out: 55 [Urine:35; Chest Tube:20]  Lab Results:  CBC: Recent Labs    11/26/18 1053 11/27/18 0503 11/27/18 0555  WBC 19.1* 21.4*  --   HGB 10.1* 8.3* 10.2*  HCT 33.5* 28.1* 30.0*  PLT 747* 574*  --     BMET:  Recent Labs    11/26/18 1053 11/27/18 0503 11/27/18 0555  NA 128* 133* 134*  K 4.8 4.4 4.4  CL 92* 101  --   CO2 23 24  --   GLUCOSE 124* 153*  --   BUN 6 9  --   CREATININE 0.68 0.56*  --   CALCIUM 8.4* 8.0*  --     Results for FITZHUGH, VIZCARRONDO (MRN [id number]) as of 11/27/2018 09:36  Ref. Range 11/27/2018 05:03  PREALBUMIN Latest Ref Range: 18 - 38 mg/dL <5 (L)   Results for PHILLIPS, GOULETTE (MRN [id number]) as of 11/27/2018 09:36  Ref. Range  11/27/2018 05:03  Albumin Latest Ref Range: 3.5 - 5.0 g/dL 1.4 (L)   PT/INR:   Recent Labs    11/26/18 1053  LABPROT 15.1  INR 1.2    CBG (last 3)  No results for input(s): GLUCAP in the last 72 hours.  ABG    Component Value Date/Time   PHART 7.427 11/27/2018 0555   PCO2ART 37.0 11/27/2018 0555   PO2ART 71.0 (L) 11/27/2018 0555   HCO3 24.5 11/27/2018 0555   TCO2 26 11/27/2018 0555   O2SAT 95.0 11/27/2018 0555    CXR: PORTABLE CHEST 1 VIEW  COMPARISON:  11/26/2018  FINDINGS: Lungs are adequately inflated. There are 3 small caliber right-sided pleural drainage catheters unchanged. No evidence of pneumothorax. Persistent opacification in the right base likely patient's known right-sided empyema with associated atelectatic change. Stable mild left basilar opacification likely atelectasis. Cardiomediastinal silhouette and remainder of the exam is unchanged.  IMPRESSION: Stable right base opacification likely representing patient's known empyema with associated atelectasis. 3 small caliber right-sided pleural drainage catheter is unchanged. Mild stable left base opacification likely atelectasis.   Electronically Signed   By: Marin Olp M.D.   On: 11/27/2018 08:32   Assessment/Plan: S/P Procedure(s) (LRB): VIDEO ASSISTED THORACOSCOPY (VATS)/EMPYEMA (Right) Video Bronchoscopy  Overall doing well POD1 drainage acute suppurative empyema complicating CAP Breathing comfortably w/ O2 sats 95% on 2 L/min, CXR looks good NSR w/ stable BP No signs of alcohol withdrawal so far Long-standing alcohol abuse Long-standing tobacco abuse w/ likely significant COPD Protein-depleted malnutrition, likely severe Essential hypertension   Mobilize  D/C Aline  D/C foley  Decrease IV fluids  Advance diet as tolerated  Continue IV Vanc and Cefepime  Follow up cultures  Eventually will need nutrition consult and PT consult  Monitor for signs of withdrawal   SCD boots - no pharmacologic anticoagulation at this time  Rexene Alberts, MD 11/27/2018 9:38 AM

## 2018-11-28 ENCOUNTER — Encounter (HOSPITAL_COMMUNITY): Payer: Self-pay | Admitting: Thoracic Surgery (Cardiothoracic Vascular Surgery)

## 2018-11-28 LAB — BASIC METABOLIC PANEL
Anion gap: 9 (ref 5–15)
BUN: 9 mg/dL (ref 6–20)
CO2: 25 mmol/L (ref 22–32)
Calcium: 8.3 mg/dL — ABNORMAL LOW (ref 8.9–10.3)
Chloride: 97 mmol/L — ABNORMAL LOW (ref 98–111)
Creatinine, Ser: 0.56 mg/dL — ABNORMAL LOW (ref 0.61–1.24)
GFR calc Af Amer: >60 mL/min (ref >60)
GFR calc non Af Amer: >60 mL/min (ref >60)
Glucose, Bld: 98 mg/dL (ref 70–99)
Potassium: 4.7 mmol/L (ref 3.5–5.1)
Sodium: 131 mmol/L — ABNORMAL LOW (ref 135–145)

## 2018-11-28 LAB — CBC
HCT: 26.8 % — ABNORMAL LOW (ref 39.0–52.0)
Hemoglobin: 7.8 g/dL — ABNORMAL LOW (ref 13.0–17.0)
MCH: 22.5 pg — ABNORMAL LOW (ref 26.0–34.0)
MCHC: 29.1 g/dL — ABNORMAL LOW (ref 30.0–36.0)
MCV: 77.2 fL — ABNORMAL LOW (ref 80.0–100.0)
Platelets: 565 10*3/uL — ABNORMAL HIGH (ref 150–400)
RBC: 3.47 MIL/uL — ABNORMAL LOW (ref 4.22–5.81)
RDW: 19.9 % — ABNORMAL HIGH (ref 11.5–15.5)
WBC: 17.9 10*3/uL — ABNORMAL HIGH (ref 4.0–10.5)
nRBC: 0 % (ref 0.0–0.2)

## 2018-11-28 MED ORDER — FUROSEMIDE 10 MG/ML IJ SOLN
20.0000 mg | Freq: Once | INTRAMUSCULAR | Status: AC
  Administered 2018-11-28: 20 mg via INTRAVENOUS
  Filled 2018-11-28: qty 2

## 2018-11-28 MED ORDER — CHLORDIAZEPOXIDE HCL 5 MG PO CAPS
25.0000 mg | ORAL_CAPSULE | Freq: Two times a day (BID) | ORAL | Status: DC
  Administered 2018-11-28: 25 mg via ORAL
  Filled 2018-11-28: qty 5

## 2018-11-28 MED ORDER — CHLORHEXIDINE GLUCONATE CLOTH 2 % EX PADS
6.0000 | MEDICATED_PAD | Freq: Every day | CUTANEOUS | Status: DC
  Administered 2018-11-28: 6 via TOPICAL

## 2018-11-28 MED ORDER — ENSURE ENLIVE PO LIQD
237.0000 mL | Freq: Three times a day (TID) | ORAL | Status: DC
  Administered 2018-11-28 – 2018-12-04 (×17): 237 mL via ORAL

## 2018-11-28 MED ORDER — FE FUMARATE-B12-VIT C-FA-IFC PO CAPS
1.0000 | ORAL_CAPSULE | Freq: Three times a day (TID) | ORAL | Status: DC
  Administered 2018-11-28 – 2018-12-04 (×19): 1 via ORAL
  Filled 2018-11-28 (×20): qty 1

## 2018-11-28 MED ORDER — CHLORHEXIDINE GLUCONATE CLOTH 2 % EX PADS
6.0000 | MEDICATED_PAD | Freq: Every day | CUTANEOUS | Status: DC
  Administered 2018-11-29 – 2018-12-03 (×5): 6 via TOPICAL

## 2018-11-28 NOTE — Progress Notes (Signed)
   Subjective: Patient was seen and evaluated at bedside on morning rounds. He has some hoarseness.  No new complaints. He received 3 doses of Ativan yesterday. No other acute event overnight.   Objective:  Vital signs in last 24 hours: Vitals:   11/28/18 0200 11/28/18 0300 11/28/18 0400 11/28/18 0500  BP: (!) 164/81 (!) 148/91 134/70 (!) 143/94  Pulse:   (!) 105   Resp: (!) 26 (!) 22 18 (!) 23  Temp:      TempSrc:      SpO2: (!) 89% 95% 95% 95%  Weight:      Height:       Physical Exam:  VS reviewed, nursing notes reviewed. General: No acute distress, has hourseness CV: RRR, nl S1S2 Pulm and chest: No respiratory distress. Chest tube is in place. No crackle on anterior and lateral chest exam Abdomen: Soft and nontender to palpation Neurologic exam: A & O x3   Assessment/Plan:  Principal Problem:   Empyema of right pleural space (HCC) Active Problems:   CAP (community acquired pneumonia)   Alcohol abuse   Tobacco abuse   Poor dentition   Empyema, right [facility])  Mr. Porreca is a 58 y.o m with aud, tobacco use disorder who presented with a 2-3 week history of dyspnea. Being treated for sepsis secondary to pulmonary etiology.  CAP complicated by acute superative empyema s/p VATS and BAL 6/20, POD 1 Patient appears to be doing much better after his procedure yesterday.  He has been afebrile.  His leukocytosis is slightly elevated to 21 today, which is probably a result of his surgery. BAL culture 6/20 growing gram-positive cocci in chains on Gram stain, culture no growth less than 12 hours.  Chest x-ray 6/21 shows stable right basal opacification.   No fever overnight, leukocytosis improved to 17.9.  Blood culture 6/20 pending--> no growth <12h  -continue vancomycin, day3 -continue cefepime, day3 -Prealbumin <5, albumin 1.4 -Appreciate Dr. Roxy Manns from cardiothoracic surgery following -surgical pathology pending   Right upper abdomen pain  CT abdomen showed aortic  atherosclerosis without other significant changes.  Hypotonic hyponatremia The patient sodium on admission was 128. Urine sodium 49, urine osmolality 291, osmolality 269. Na 131 today. No improvement today. Will continue fluid restriction.  -Fluid resuscitation  Microcytic anemia Thrombocytosis Platelets 747 on admission now improved to 565. Iron deficiency anemia: Hemoglobin 7.8.  Saturation: 3%, ferritin 105 (can be falsely elevated due to acute illness)  -Hold off of iron due to active infection -Consider pathologist reviewed peripheral blood smear if persists  Alcohol use disorder  Drinks 12 beers daily and has had withdrawal symptoms prior. Last etoh morning 6/20.  Significant other states that he the patient has had several hospitalizations during which he had withdrawals. Required 3 doses of Ativan since yesterday  -Continue with CIWA with ativan -Increaseing Librium to 31m BID -Ct Multivitamins  -Ct Folic acid 164mqd  -Ct Thiamine 10056md  Dispo: Anticipated discharge in approximately 2-3 day(s).   MasDewayne HatchD 11/28/2018, 5:55 AM Pager: [phone]

## 2018-11-28 NOTE — Progress Notes (Signed)
Initial Nutrition Assessment  RD working remotely.  DOCUMENTATION CODES:   Not applicable, suspect some degree of malnutrition but unable to confirm without NFPE.  INTERVENTION:   - Ensure Enlive po TID, each supplement provides 350 kcal and 20 grams of protein  - Continue MVI with minerals daily  - Encourage adequate PO intake  NUTRITION DIAGNOSIS:   Inadequate oral intake related to decreased appetite as evidenced by meal completion < 50%, per patient/family report.  GOAL:   Patient will meet greater than or equal to 90% of their needs  MONITOR:   PO intake, Supplement acceptance, Labs, I & O's, Weight trends  REASON FOR ASSESSMENT:   Consult Assessment of nutrition requirement/status  ASSESSMENT:   58 year old male who presented to the ED on 6/20 with right flank pain and N/V. PMH of COPD, EtOH abuse, hepatitis C, tobacco use. Chest x-ray and CT chest showing CAP with right pleural effusion that is concerning for empyema with a right paratracheal lymph node.  6/20 - s/p VATS  Spoke with pt via phone call to room. Pt states he is feeling some better but not much. Pt with very hoarse voice and difficult to understand over phone call.  Pt states that he doesn't have much of an appetite at this time. Pt reports that the food is good and he is "eating what I can." RD discussed the importance of adequate kcal and protein intake in healing and maintaining lean muscle mass. RD encouraged pt to eat as well as he feels like he can at mealtimes. Pt expresses understanding.  Pt states that PTA, he was eating well which included 1-2 meals/day. Pt states that a meal would include a cheeseburger "or something like that." When RD asked pt to elaborate, pt states that actually his appetite was poor for 2-3 weeks PTA due to being sick.  Pt states that he has noticed his weight trending down over the last 2-3 weeks due to not eating much. Pt states that his UBW is 150 lbs. Weight on 6/20  was 155.9 lbs. RD will continue to monitor trends during admission.  Reviewed weight history in chart. Last weight available PTA is from 2018 and is consistent with pt's current weight.  Pt amenable to receiving an oral nutrition supplement to aid in meeting kcal and protein needs. RD will order Ensure Enlive TID.  Suspect some degree of malnutrition but unable to confirm without NFPE.  Meal Completion: 40% x 1 recorded meal  Medications reviewed and include: Dulcolax, Trinsicon capsule TID after meals, MVI with minerals, Senna, thiamine, IV abx, D5 with KCl @ 10 ml/hr  Labs reviewed: sodium 131, chloride 97, iron 6, hemoglobin 7.8  UOP; 1160 ml x 24 hours CT: 410 ml x 24 hours I/O's: +1.2 L since admit  NUTRITION - FOCUSED PHYSICAL EXAM:  Unable to complete at this time. RD working remotely.  Diet Order:   Diet Order            Diet Heart Room service appropriate? Yes; Fluid consistency: Thin  Diet effective now              EDUCATION NEEDS:   Education needs have been addressed  Skin:  Skin Assessment: Skin Integrity Issues: Incisions: closed incision to right chest  Last BM:  11/25/18  Height:   Ht Readings from Last 1 Encounters:  11/26/18 5\' 8"  (1.727 m)    Weight:   Wt Readings from Last 1 Encounters:  11/26/18 70.7 kg  Ideal Body Weight:  70 kg  BMI:  Body mass index is 23.7 kg/m.  Estimated Nutritional Needs:   Kcal:  2000-2200  Protein:  100-115 grams  Fluid:  >/= 2.0 L    Gaynell Face, MS, RD, LDN Inpatient Clinical Dietitian Pager: [phone] Weekend/After Hours: [phone]

## 2018-11-28 NOTE — Progress Notes (Signed)
CT surgery p.m. Rounds  Patient ambulated today 200 cc serosanguineous drainage Pain appears to be adequately controlled On 3 L nasal cannula, heart rate 100 sinus rhythm

## 2018-11-28 NOTE — Progress Notes (Signed)
      [address]       [place] [address]             [phone]        CARDIOTHORACIC SURGERY PROGRESS NOTE   R2 Days Post-Op Procedure(s) (LRB): VIDEO ASSISTED THORACOSCOPY (VATS)/EMPYEMA (Right) Video Bronchoscopy  Subjective: Reports feeling better.  Pain under good control.  Cough improving.  No SOB.  No reported signs of agitation  Objective: Vital signs: BP Readings from Last 1 Encounters:  11/28/18 118/80   Pulse Readings from Last 1 Encounters:  11/28/18 (!) 105   Resp Readings from Last 1 Encounters:  11/28/18 (!) 31   Temp Readings from Last 1 Encounters:  11/28/18 98 F (36.7 C)    Hemodynamics:    Physical Exam:  Rhythm:   Sinus tach  Breath sounds: Scattered rhonchi  Heart sounds:  tachy  Incisions:  Dressings dry, intact  Abdomen:  Soft, non-distended, non-tender  Extremities:  Warm, well-perfused  Chest tubes:  Decreasing but significant volume thin serosanguinous output, no air leak    Intake/Output from previous day: 06/21 0701 - 06/22 0700 In: 2578 [P.O.:1320; I.V.:420.9; IV Piggyback:837.1] Out: 1570 [Urine:1160; Chest Tube:410] Intake/Output this shift: Total I/O In: 10 [I.V.:10] Out: 315 [Urine:275; Chest Tube:40]  Lab Results:  CBC: Recent Labs    11/27/18 0503 11/27/18 0555 11/28/18 0352  WBC 21.4*  --  17.9*  HGB 8.3* 10.2* 7.8*  HCT 28.1* 30.0* 26.8*  PLT 574*  --  565*    BMET:  Recent Labs    11/26/18 1053 11/27/18 0503 11/27/18 0555  NA 128* 133* 134*  K 4.8 4.4 4.4  CL 92* 101  --   CO2 23 24  --   GLUCOSE 124* 153*  --   BUN 6 9  --   CREATININE 0.68 0.56*  --   CALCIUM 8.4* 8.0*  --      PT/INR:   Recent Labs    11/26/18 1053  LABPROT 15.1  INR 1.2    CBG (last 3)  No results for input(s): GLUCAP in the last 72 hours.  ABG    Component Value Date/Time   PHART 7.427 11/27/2018 0555   PCO2ART 37.0 11/27/2018 0555   PO2ART 71.0 (L) 11/27/2018 0555   HCO3 24.5  11/27/2018 0555   TCO2 26 11/27/2018 0555   O2SAT 95.0 11/27/2018 0555    CXR: pending  Assessment/Plan: S/P Procedure(s) (LRB): VIDEO ASSISTED THORACOSCOPY (VATS)/EMPYEMA (Right) Video Bronchoscopy  Clinically stable/improving POD2 drainage acute suppurative empyema complicating CAP Breathing comfortably w/ O2 sats 90-95% on 3 L/min NSR w/ stable BP No signs of alcohol withdrawal so far Long-standing alcohol abuse Long-standing tobacco abuse w/ likely significant COPD Protein-depleted malnutrition, likely severe Essential hypertension   Mobilize  Advance diet as tolerated  Continue IV Vanc and Cefepime  Follow up cultures  nutrition consult and PT consult  Monitor for signs of withdrawal  SCD boots - no pharmacologic anticoagulation at this time  Transfer 2C  Rexene Alberts, MD 11/28/2018 8:12 AM

## 2018-11-28 NOTE — Anesthesia Postprocedure Evaluation (Signed)
Anesthesia Post Note  Patient: Chief Operating Officer  Procedure(s) Performed: VIDEO ASSISTED THORACOSCOPY (VATS)/EMPYEMA (Right Chest) Video Bronchoscopy     Patient location during evaluation: PACU Anesthesia Type: General Level of consciousness: awake and alert Pain management: pain level controlled Vital Signs Assessment: post-procedure vital signs reviewed and stable Respiratory status: spontaneous breathing, nonlabored ventilation, respiratory function stable and patient connected to nasal cannula oxygen Cardiovascular status: blood pressure returned to baseline and stable Postop Assessment: no apparent nausea or vomiting Anesthetic complications: no    Last Vitals:  Vitals:   11/28/18 0700 11/28/18 0800  BP: (!) 145/93 118/80  Pulse:    Resp: (!) 22 (!) 31  Temp:  37.1 C  SpO2: 97% 90%    Last Pain:  Vitals:   11/28/18 0800  TempSrc: Axillary  PainSc:                  [place] S

## 2018-11-29 ENCOUNTER — Inpatient Hospital Stay (HOSPITAL_COMMUNITY): Payer: Medicaid Other

## 2018-11-29 LAB — BASIC METABOLIC PANEL
Anion gap: 10 (ref 5–15)
BUN: 9 mg/dL (ref 6–20)
CO2: 27 mmol/L (ref 22–32)
Calcium: 8.3 mg/dL — ABNORMAL LOW (ref 8.9–10.3)
Chloride: 93 mmol/L — ABNORMAL LOW (ref 98–111)
Creatinine, Ser: 0.59 mg/dL — ABNORMAL LOW (ref 0.61–1.24)
GFR calc Af Amer: >60 mL/min (ref >60)
GFR calc non Af Amer: >60 mL/min (ref >60)
Glucose, Bld: 107 mg/dL — ABNORMAL HIGH (ref 70–99)
Potassium: 4.1 mmol/L (ref 3.5–5.1)
Sodium: 130 mmol/L — ABNORMAL LOW (ref 135–145)

## 2018-11-29 LAB — CBC
HCT: 27.2 % — ABNORMAL LOW (ref 39.0–52.0)
Hemoglobin: 8 g/dL — ABNORMAL LOW (ref 13.0–17.0)
MCH: 22.2 pg — ABNORMAL LOW (ref 26.0–34.0)
MCHC: 29.4 g/dL — ABNORMAL LOW (ref 30.0–36.0)
MCV: 75.6 fL — ABNORMAL LOW (ref 80.0–100.0)
Platelets: 564 10*3/uL — ABNORMAL HIGH (ref 150–400)
RBC: 3.6 MIL/uL — ABNORMAL LOW (ref 4.22–5.81)
RDW: 19 % — ABNORMAL HIGH (ref 11.5–15.5)
WBC: 12 10*3/uL — ABNORMAL HIGH (ref 4.0–10.5)
nRBC: 0 % (ref 0.0–0.2)

## 2018-11-29 LAB — OSMOLALITY: Osmolality: 272 mOsm/kg — ABNORMAL LOW (ref 275–295)

## 2018-11-29 MED ORDER — KETOROLAC TROMETHAMINE 15 MG/ML IJ SOLN: 15.0000 mg | Freq: Four times a day (QID) | INTRAMUSCULAR | Status: DC | PRN

## 2018-11-29 MED ORDER — CHLORDIAZEPOXIDE HCL 5 MG PO CAPS
25.0000 mg | ORAL_CAPSULE | Freq: Once | ORAL | Status: AC
  Administered 2018-11-29: 25 mg via ORAL
  Filled 2018-11-29: qty 5

## 2018-11-29 MED ORDER — KETOROLAC TROMETHAMINE 30 MG/ML IJ SOLN
30.0000 mg | Freq: Once | INTRAMUSCULAR | Status: AC
  Administered 2018-11-29: 30 mg via INTRAVENOUS
  Filled 2018-11-29: qty 1

## 2018-11-29 MED ORDER — CHLORDIAZEPOXIDE HCL 5 MG PO CAPS: 25.0000 mg | ORAL_CAPSULE | Freq: Every day | ORAL | Status: DC

## 2018-11-29 MED ORDER — MORPHINE SULFATE (PF) 2 MG/ML IV SOLN
2.0000 mg | INTRAVENOUS | Status: DC | PRN
  Administered 2018-11-29 – 2018-11-30 (×3): 2 mg via INTRAVENOUS
  Filled 2018-11-29 (×3): qty 1

## 2018-11-29 MED ORDER — KETOROLAC TROMETHAMINE 15 MG/ML IJ SOLN
15.0000 mg | Freq: Four times a day (QID) | INTRAMUSCULAR | Status: AC | PRN
  Administered 2018-11-29 – 2018-11-30 (×3): 15 mg via INTRAVENOUS
  Filled 2018-11-29 (×3): qty 1

## 2018-11-29 NOTE — Evaluation (Signed)
Physical Therapy Evaluation Patient Details Name: [name] MRN: [id number] DOB: [birth year] Today's Date: 11/29/2018   History of Present Illness  Patient is a 58 year old male with history of COPD, longstanding heavy tobacco abuse and alcohol abuse who presented to the emergency department with a progressive 2-week history of fevers, productive cough, and right-sided pleuritic chest pain and underwent chest x-ray and CT scan which are diagnostic of community-acquired pneumonia with empyema. Pt underwent a R VATS.  Clinical Impression  Pt admitted with above. Pt impulsive with decreased safety awareness however suspect this is baseline personality. Pt mobilizing well with RW. Suspect pt to progress quickly and be able to ambulate without AD in near future. Acute PT to follow.    Follow Up Recommendations No PT follow up;Supervision/Assistance - 24 hour    Equipment Recommendations  None recommended by PT    Recommendations for Other Services       Precautions / Restrictions Precautions Precautions: Fall Precaution Comments: R chest tube Restrictions Weight Bearing Restrictions: No      Mobility  Bed Mobility Overal bed mobility: Needs Assistance Bed Mobility: Supine to Sit     Supine to sit: Supervision     General bed mobility comments: supervision and assist for lines and tubes only, no physical assist needed, verbal cues for safety due to impulsivity and not watching for lines  Transfers Overall transfer level: Needs assistance Equipment used: None Transfers: Sit to/from Stand Sit to Stand: Min guard         General transfer comment: min guard for safety due to impulsivity but no physical assist  Ambulation/Gait Ambulation/Gait assistance: Min guard Gait Distance (Feet): 500 Feet Assistive device: Rolling walker (2 wheeled) Gait Pattern/deviations: Step-through pattern;Decreased stride length Gait velocity: dec compared to normal Gait velocity  interpretation: 1.31 - 2.62 ft/sec, indicative of limited community ambulator General Gait Details: no epsiodes of LOB, good walker managemnet, SPO2 at >88% on RA, denies extensive SOB  Stairs            Wheelchair Mobility    Modified Rankin (Stroke Patients Only)       Balance Overall balance assessment: Mild deficits observed, not formally tested                                           Pertinent Vitals/Pain Pain Assessment: No/denies pain    Home Living Family/patient expects to be discharged to:: Private residence Living Arrangements: Spouse/significant other Available Help at Discharge: Family;Available 24 hours/day Type of Home: House Home Access: Stairs to enter Entrance Stairs-Rails: Can reach both Entrance Stairs-Number of Steps: 3 Home Layout: One level Home Equipment: None      Prior Function Level of Independence: Independent         Comments: works around the Aeronautical engineer   Dominant Hand: Right    Extremity/Trunk Assessment   Upper Extremity Assessment Upper Extremity Assessment: Overall WFL for tasks assessed    Lower Extremity Assessment Lower Extremity Assessment: Overall WFL for tasks assessed    Cervical / Trunk Assessment Cervical / Trunk Assessment: Other exceptions Cervical / Trunk Exceptions: R chest tube  Communication   Communication: No difficulties  Cognition Arousal/Alertness: Awake/alert Behavior During Therapy: WFL for tasks assessed/performed Overall Cognitive Status: Within Functional Limits for tasks assessed  General Comments: pt impuslive with decreased safety awareness however suspect this to be baseline personality      General Comments General comments (skin integrity, edema, etc.): assist pt to the bathroom and pt found stumbling over lines when trying to get up, no fall however educated pt on importance of asking for assist when  getting up due to all the lines    Exercises     Assessment/Plan    PT Assessment Patient needs continued PT services  PT Problem List Decreased strength;Decreased activity tolerance;Decreased balance;Decreased mobility;Decreased knowledge of use of DME;Decreased safety awareness       PT Treatment Interventions DME instruction;Gait training;Stair training;Functional mobility training;Therapeutic activities;Therapeutic exercise;Balance training;Neuromuscular re-education    PT Goals (Current goals can be found in the Care Plan section)  Acute Rehab PT Goals Patient Stated Goal: home soon PT Goal Formulation: With patient Time For Goal Achievement: 12/13/18 Potential to Achieve Goals: Good    Frequency Min 3X/week   Barriers to discharge        Co-evaluation               AM-PAC PT "6 Clicks" Mobility  Outcome Measure Help needed turning from your back to your side while in a flat bed without using bedrails?: None Help needed moving from lying on your back to sitting on the side of a flat bed without using bedrails?: None Help needed moving to and from a bed to a chair (including a wheelchair)?: None Help needed standing up from a chair using your arms (e.g., wheelchair or bedside chair)?: A Little Help needed to walk in hospital room?: A Little Help needed climbing 3-5 steps with a railing? : A Little 6 Click Score: 21    End of Session Equipment Utilized During Treatment: Gait belt Activity Tolerance: Patient tolerated treatment well Patient left: in bed;with call bell/phone within reach;with bed alarm set Nurse Communication: Mobility status PT Visit Diagnosis: Unsteadiness on feet (R26.81);Difficulty in walking, not elsewhere classified (R26.2)    Time: 1206-1226 PT Time Calculation (min) (ACUTE ONLY): 20 min   Charges:   PT Evaluation $PT Eval Moderate Complexity: 1 Mod          Kittie Plater, PT, DPT Acute Rehabilitation Services Pager #:  [phone] Office #: [phone]   Berline Lopes 11/29/2018, 12:37 PM

## 2018-11-29 NOTE — Progress Notes (Addendum)
[facility] DAILY ICU PROGRESS NOTE                   [address]            ,[place] [address]          [phone]   3 Days Post-Op Procedure(s) (LRB): VIDEO ASSISTED THORACOSCOPY (VATS)/EMPYEMA (Right) Video Bronchoscopy  Total Length of Stay:  LOS: 3 days   Subjective: Sitting up in chair, says he is very uncomfortable due to incisional pain.  Productive cough, mostly clear sputum.  Objective: Vital signs in last 24 hours: Temp:  [97.8 F (36.6 C)-98.5 F (36.9 C)] 98.3 F (36.8 C) (06/23 0733) Pulse Rate:  [104-132] 115 (06/23 0700) Cardiac Rhythm: Sinus tachycardia (06/23 0400) Resp:  [19-35] 19 (06/23 0700) BP: (132-181)/(66-106) 151/87 (06/23 0700) SpO2:  [84 %-99 %] 95 % (06/23 0733)  Filed Weights   11/26/18 1300 11/26/18 2120  Weight: 72.6 kg 70.7 kg    Weight change:     Intake/Output from previous day: 06/22 0701 - 06/23 0700 In: 1600 [P.O.:1080; I.V.:219.9; IV Piggyback:300.1] Out: 3785 [Urine:3375; Chest Tube:410]  Intake/Output this shift: No intake/output data recorded.  Current Meds: Scheduled Meds: . acetaminophen  1,000 mg Oral Q6H   Or  . acetaminophen (TYLENOL) oral liquid 160 mg/5 mL  1,000 mg Oral Q6H  . bisacodyl  10 mg Oral Daily  . chlordiazePOXIDE  25 mg Oral BID  . Chlorhexidine Gluconate Cloth  6 each Topical Q0600  . feeding supplement (ENSURE ENLIVE)  237 mL Oral TID BM  . ferrous [id number] C-folic acid  1 capsule Oral TID PC  . ipratropium-albuterol  3 mL Nebulization Q6H  . ketorolac  30 mg Intravenous Once  . multivitamin with minerals  1 tablet Oral Daily  . nicotine  21 mg Transdermal Daily  . senna-docusate  1 tablet Oral QHS  . thiamine injection  100 mg Intravenous Daily   Continuous Infusions: . ceFEPime (MAXIPIME) IV Stopped (11/29/18 0646)  . dextrose 5 % and 0.9 % NaCl with KCl 20 mEq/L 10 mL/hr at 11/29/18 0700   PRN Meds:.acetaminophen **OR** acetaminophen, ketorolac, LORazepam,  morphine injection, ondansetron (ZOFRAN) IV, oxyCODONE, traMADol  General appearance: alert, cooperative and moderate distress Neurologic: intact Heart: Sinus tachycardia. Lungs: Breath sounds clear posterior, still diminished right base. CT drainage 475ml thin serous fluid past 24 hours.  No air leak. Wound: Chest tubes secured, Right chest incision dressing is dry.  Lab Results: CBC: Recent Labs    11/28/18 0352 11/29/18 0326  WBC 17.9* 12.0*  HGB 7.8* 8.0*  HCT 26.8* 27.2*  PLT 565* 564*   BMET:  Recent Labs    11/28/18 0755 11/29/18 0326  NA 131* 130*  K 4.7 4.1  CL 97* 93*  CO2 25 27  GLUCOSE 98 107*  BUN 9 9  CREATININE 0.56* 0.59*  CALCIUM 8.3* 8.3*    CMET: Lab Results  Component Value Date   WBC 12.0 (H) 11/29/2018   HGB 8.0 (L) 11/29/2018   HCT 27.2 (L) 11/29/2018   PLT 564 (H) 11/29/2018   GLUCOSE 107 (H) 11/29/2018   ALT 15 11/27/2018   AST 17 11/27/2018   NA 130 (L) 11/29/2018   K 4.1 11/29/2018   CL 93 (L) 11/29/2018   CREATININE 0.59 (L) 11/29/2018   BUN 9 11/29/2018   CO2 27 11/29/2018   TSH 1.290 07/12/2014   INR 1.2 11/26/2018   HGBA1C 5.1 03/18/2011  PT/INR:  Recent Labs    11/26/18 1053  LABPROT 15.1  INR 1.2   Radiology: Dg Chest Port 1 View  Result Date: 11/29/2018 CLINICAL DATA:  Empyema.  Chest tube. EXAM: PORTABLE CHEST 1 VIEW COMPARISON:  11/27/2018. FINDINGS: Three right chest tubes in stable position. No pneumothorax. Stable right pleural thickening/small effusion. Stable right base atelectasis/infiltrate. Left base atelectasis/infiltrate small left pleural effusion again noted. Heart size stable. No acute bony abnormality. IMPRESSION: 1. Three right chest tubes in stable position with stable right base pleural thickening/small effusion. Persistent right base atelectasis/infiltrate. 2. Left base atelectasis/infiltrate and small left pleural effusion again noted. Electronically Signed   By: Marcello Moores  Register   On:  11/29/2018 07:58     Assessment/Plan: S/P Procedure(s) (LRB): VIDEO ASSISTED THORACOSCOPY (VATS)/EMPYEMA (Right) Video Bronchoscopy  -POD3 bronch, right VATS for drainage of empyema from CAP. Respiratory status stable but pain control not adequate. Add Toradol for 48 hours.  Pleural fluid Cx re-incubated for better growth. Continue cefepime for now. CXR noted. Continue to encourage pulmonary hygiene, nebs. Transfer to [facility] when bed available.    -History of EtOH abuse- CIWA protocol and thiamine ordered.  -DVT PPX- add Lovenox    Antony Odea, PA-C [phone] 11/29/2018 8:02 AM   I have seen and examined the patient and agree with the assessment and plan as outlined.  Pleural fluid culture growing Streptococcus intermedius, sensitivities pending.  Awaiting bed on 2C for transfer  Rexene Alberts, MD 11/29/2018

## 2018-11-29 NOTE — Progress Notes (Signed)
   Subjective: [name] was seen resting in his bed this morning eating breakfast.  He reports that he is doing better today, he feels that his breathing is doing a little better. He is still coughing up sputum. And reports some mild burning pain at the site of chest tube insertion. Other wise, no other complaint.  Objective:  Vital signs in last 24 hours: Vitals:   11/29/18 0500 11/29/18 0600 11/29/18 0700 11/29/18 0733  BP: 140/75 (!) 148/88 (!) 151/87   Pulse: (!) 106 (!) 132 (!) 115   Resp: (!) 23 (!) 24 19   Temp:    98.3 F (36.8 C)  TempSrc:    Oral  SpO2: 96% 95% 94% 95%  Weight:      Height:       General: No acute distress CV: RRR, nl S1S2 Pulm and chest: No respiratory distress.  Chest tube is in place and dressing is intact.  Has some cloudy drainage through the tube.  Mild rhonchi on anterior and lateral chest exam. Abdomen:  Abdomen is soft and nontender to palpation.  Assessment/Plan:  Principal Problem:   Empyema of right pleural space (HCC) Active Problems:   CAP (community acquired pneumonia)   Alcohol abuse   Tobacco abuse   Poor dentition   Empyema, right (HCC)  CAP complicated by acute superativeempyema s/p VATS and BAL 6/20,POD 3 Patient appears to be doing much better today. He has been afebrile.  Leukocytosis trended down to 12. BAL culture 6/20 growing gram-positive cocci in chains on Gram stain, culture no growth less than12 hours?. Cardiothoracic surgery is following. recommended reincubation for better growth.   Chest x-ray today 6/23 shows 3 right chest tubes in stable position with stable right base pleural thickening/small effusion. Persistent right base atelectasis/infiltrate. 2. Left base atelectasis/infiltrate and small left pleural effusion again noted. Discontinued vancomycin 6/22, since MRSA PCR came back negative. -continue cefepime -f/u BAL culture  -Continue pain control with Toradol and Oxycodone and Morphine PRN (only  required 1 dose of morphine. -Lovenox for PPX  Hypotonic hyponatremia The patient sodium on admission was 128.Urine sodium 49, urine osmolality 291, osmolality 269. Na 131 today.  -continue fluid restriction.  Microcytic anemia Thrombocytosis Hb stable at 8 today Platelets 747 on admission now improved to 565. Iron deficiency anemia: Hemoglobin 7.8.  Saturation: 3%, ferritin 105 (can be falsely elevated due to acute illness)  -Hold off of iron due to active infection -Considerpathologist reviewed peripheral blood smearif persists  Alcohol use disorder  Drinks 12 beers daily and has had withdrawal symptoms prior. Last etoh morning 6/20.Significant other states that he the patient has had several hospitalizations during which he had withdrawals.  No further withdrawal. Last CIWA 4. No Ativan required since yesterday  -Continue with CIWA with ativan -DecreasedLibrium to 31mQD today and DC tomorrow -Ct Multivitamins  -Ct Folic acid 1100mqd  -Ct Thiamine 10066md   Dispo: Anticipated discharge in approximately 3-4 days  MasDewayne HatchD 11/29/2018, 8:59 AM Pager: _0 @

## 2018-11-29 NOTE — Progress Notes (Signed)
Pharmacy Antibiotic Note  [name] is a 58 y.o. male admitted on 11/26/2018 with suspected pneumonia.  Pharmacy has been consulted for cefepime dosing. Today is day 4 of antibiotics for cefepime. Patient has been improving clinically, 6/20 BAL culture showing mod GPC in chains / in pairs; all other cultures no growth to date. BAL culture is most likely strept pneumo?. Patient was found to have an empyema and is now s/p VATs procedure.  Patient's WBC downward trending 21.4>17.9>12, afebrile, NSR, RR 19; scr 0.59 and stable.   Plan: Continue Cefepime 2g IV every 8 hours Monitor signs/ symptoms of infection and clinical improvement, renal function and follow-up with length of therapy per CVTS.   Height: _0  (172.7 cm) Weight: 155 lb 13.8 oz (70.7 kg) IBW/kg (Calculated) : 68.4  Temp (24hrs), Avg:98.4 F (36.9 C), Min:98.3 F (36.8 C), Max:98.5 F (36.9 C)  Recent Labs  Lab 11/26/18 1053 11/27/18 0503 11/28/18 0352 11/28/18 0755 11/29/18 0326  WBC 19.1* 21.4* 17.9*  --  12.0*  CREATININE 0.68 0.56*  --  0.56* 0.59*  LATICACIDVEN 1.8  --   --   --   --     Estimated Creatinine Clearance: 99.8 mL/min (A) (by C-G formula based on SCr of 0.59 mg/dL (L)).    Allergies  Allergen Reactions  . Penicillins Other (See Comments)    Tolerated Zosyn 11/2018 Has patient had a PCN reaction causing immediate rash, facial/tongue/throat swelling, SOB or lightheadedness with hypotension: childhood allergy Has patient had a PCN reaction causing severe rash involving mucus membranes or skin necrosis: childhood allergy Has patient had a PCN reaction that required hospitalization childhood allergy Has patient had a PCN reaction occurring within the last 10 years: childhood allergy If all of the above answers are "NO", then may proceed wit    Antimicrobials this admission: Vanc 6/20 >> 6/22 Zosyn 6/20 > 6/21 Cefepime 6/20 >>   Dose adjustments this admission: N/A  Microbiology  results: 6/20 pleural fluid: ngtd 6/20 BAL: mod GPC in chains / in pairs 6/20BCx: ngtd 6/20 COVIDPCR:neg 6/21 MRSA negative    Thank you for the interesting consult and for involving pharmacy in this patient's care.  Tamela Gammon, PharmD 11/29/2018 2:45 PM PGY-1 Pharmacy Resident Direct Phone: [phone] Please check [email or web address] for unit-specific pharmacist phone numbers

## 2018-11-29 NOTE — Progress Notes (Signed)
      [address]       [place] [address]             [phone]      POD # 3 drainage of empyema  Asleep  BP 138/86   Pulse (!) 105   Temp 98.4 F (36.9 C) (Oral)   Resp (!) 30   Ht 5\' 8"  (1.727 m)   Wt 70.7 kg   SpO2 94%   BMI 23.70 kg/m   Intake/Output Summary (Last 24 hours) at 11/29/2018 1826 Last data filed at 11/29/2018 1700 Gross per 24 hour  Intake 1283.15 ml  Output 1700 ml  Net -416.85 ml   Continue current Rx  Jaine Estabrooks C. Roxan Hockey, MD [facility] [phone]

## 2018-11-30 LAB — BASIC METABOLIC PANEL
Anion gap: 12 (ref 5–15)
BUN: 10 mg/dL (ref 6–20)
CO2: 27 mmol/L (ref 22–32)
Calcium: 8.3 mg/dL — ABNORMAL LOW (ref 8.9–10.3)
Chloride: 92 mmol/L — ABNORMAL LOW (ref 98–111)
Creatinine, Ser: 0.66 mg/dL (ref 0.61–1.24)
GFR calc Af Amer: >60 mL/min (ref >60)
GFR calc non Af Amer: >60 mL/min (ref >60)
Glucose, Bld: 90 mg/dL (ref 70–99)
Potassium: 4.1 mmol/L (ref 3.5–5.1)
Sodium: 131 mmol/L — ABNORMAL LOW (ref 135–145)

## 2018-11-30 LAB — CBC
HCT: 27.3 % — ABNORMAL LOW (ref 39.0–52.0)
Hemoglobin: 8.1 g/dL — ABNORMAL LOW (ref 13.0–17.0)
MCH: 22.5 pg — ABNORMAL LOW (ref 26.0–34.0)
MCHC: 29.7 g/dL — ABNORMAL LOW (ref 30.0–36.0)
MCV: 75.8 fL — ABNORMAL LOW (ref 80.0–100.0)
Platelets: 601 10*3/uL — ABNORMAL HIGH (ref 150–400)
RBC: 3.6 MIL/uL — ABNORMAL LOW (ref 4.22–5.81)
RDW: 19.3 % — ABNORMAL HIGH (ref 11.5–15.5)
WBC: 12.1 10*3/uL — ABNORMAL HIGH (ref 4.0–10.5)
nRBC: 0.2 % (ref 0.0–0.2)

## 2018-11-30 MED ORDER — IPRATROPIUM-ALBUTEROL 0.5-2.5 (3) MG/3ML IN SOLN: 3.0000 mL | Freq: Four times a day (QID) | RESPIRATORY_TRACT | Status: DC | PRN

## 2018-11-30 MED ORDER — CHLORDIAZEPOXIDE HCL 5 MG PO CAPS
10.0000 mg | ORAL_CAPSULE | Freq: Once | ORAL | Status: AC
  Administered 2018-11-30: 10 mg via ORAL
  Filled 2018-11-30: qty 2

## 2018-11-30 MED ORDER — ENOXAPARIN SODIUM 40 MG/0.4ML ~~LOC~~ SOLN
40.0000 mg | SUBCUTANEOUS | Status: DC
  Administered 2018-11-30 – 2018-12-04 (×5): 40 mg via SUBCUTANEOUS
  Filled 2018-11-30 (×5): qty 0.4

## 2018-11-30 MED ORDER — VITAMIN B-1 100 MG PO TABS
100.0000 mg | ORAL_TABLET | Freq: Every day | ORAL | Status: DC
  Administered 2018-12-01 – 2018-12-04 (×4): 100 mg via ORAL
  Filled 2018-11-30 (×4): qty 1

## 2018-11-30 NOTE — Discharge Summary (Signed)
Physician Discharge Summary  Patient ID: [name] MRN: [id number] DOB/AGE: 1960-10-23 58 y.o.  Admit date: 11/26/2018 Discharge date: 12/04/2018  Admission Diagnoses: Principal Problem:   Empyema of right pleural space ([place]) Active Problems:   CAP (community acquired pneumonia)   Alcohol abuse   Tobacco abuse   Poor dentition   Empyema, right (HCC)   History of hepatitis C  Discharge Diagnoses:  Principal Problem:   Empyema of right pleural space ([place]) Active Problems:   CAP (community acquired pneumonia)   Alcohol abuse   Tobacco abuse   Poor dentition   Empyema, right (HCC)   History of hepatitis C  Discharged Condition: good  History of Present IllnessI:  Patient is a 58 year old male with history of COPD, longstanding heavy tobacco abuse and alcohol abuse who presented to the emergency department with a progressive 2-week history of fevers, productive cough, and right-sided pleuritic chest pain and underwent chest x-ray and CT scan which are diagnostic of community-acquired pneumonia with empyema.  Patient states that approximately 2 weeks ago he began to have fevers and malaise.  He developed a productive cough.  Symptoms have gotten worse and over the last few days he has developed pain along the right side of his chest which is exacerbated by deep breath and cough.  He has not had abdominal pain and he reports normal bowel function with no history of diarrhea.  He denies history of hemoptysis.  He denies any changes in olfactory sensory function or other neurologic changes.  He denies blurry vision.  He has not had nausea or vomiting.  Symptoms got much worse over the past 12 to 24 hours prompting him to present to the emergency department.  Admission blood work reveals leukocytosis with white blood count 19,000 and chest x-ray revealed diffuse opacity in the right lung field.  CT scan demonstrates findings consistent with empyema.  Patient lives locally in  [place] with his close friend who he refers to as his wife.  He is not working.  He admits to smoking 1-1/2 packs of cigarettes daily and drinking at least 12 beers every day.  He reports no significant physical limitations prior to the last few weeks and states that prior to that he was functionally independent.  Hospital Course:  Mr. Ronnald Ramp was admitted to the hospital and taken to the operating room 11/26/2018 where right video-assisted thoracoscopy was carried out for drainage of empyema and placement of a chest tube.  This is the operative note below.  Following the procedure, he was extubated and transferred to the postanesthesia care unit clearly stable during his initial recovery.  He was then transferred to the cardiovascular ICU.  Empiric antibiotic therapy was continued with IV cefepime.  Pleural fluid cultures obtained in the operating room grew Streptococcus intermedius.  The patient made good effort with work on pulmonary hygiene with incentive spirometry.  He was also treated with nebulized bronchodilator therapy.  Chest tube drainage was moderate for the first few days following surgery then tapered off.  Physical therapy was requested for assistance with mobility.  He participated with therapy and made good progress.  Diet was advanced and well-tolerated. Antibiotics were continued and adjusted by the internal medicine team. He continued to make progress clinically.  His WBC remained around 12-14, 000 during the last several days of the admission. He had no fever. After the chest tube drainage decreased to 64ml for a 24 hour period, the dhest tube was removed and follow up CXR was stable.  He was ready for discharge on 12/04/18.  Consults: Internal medicine  Significant Diagnostic Studies:   CLINICAL DATA:  58 year old male with acute RIGHT flank and abdominal pain with nausea and vomiting for 3 days and cough and congestion for 2 days.  EXAM: CT CHEST, ABDOMEN, AND PELVIS WITH  CONTRAST  TECHNIQUE: Multidetector CT imaging of the chest, abdomen and pelvis was performed following the standard protocol during bolus administration of intravenous contrast.  CONTRAST:  156mL OMNIPAQUE IOHEXOL 300 MG/ML  SOLN  COMPARISON:  11/26/2018 and 01/31/2017 chest radiographs. 07/12/2014 chest CT. 03/08/2012 abdominal ultrasound.  FINDINGS: CT CHEST FINDINGS  Cardiovascular: Cardiomegaly again noted. Coronary artery and thoracic aortic atherosclerotic calcifications again noted. There is no evidence of thoracic aortic aneurysm or pericardial effusion.  Mediastinum/Nodes: A 1.2 cm high RIGHT paratracheal lymph node is identified (series 3: Image 10). Other shotty mediastinal and hilar lymph nodes are identified.  Lungs/Pleura: Heterogeneous low-attenuation of majority of the RIGHT middle lobe is identified, measuring up to 7 x 10.5 cm and greatest transverse diameter. There is possible narrowing of some RIGHT middle lobe bronchi.  RIGHT LOWER lobe atelectasis is noted.  Moderate RIGHT pleural effusion is loculated with most prominent loculations as follows:  A 3.2 x 6.6 cm loculation anteriorly along the mid RIGHT chest  A 5.8 x 6.8 cm loculation posteriorly along the mid RIGHT chest  A 5.2 x 8.6 cm x 12 cm loculation along the posterior LOWER RIGHT lung.  Mild pleural enhancement is noted.  Mild consolidation/atelectasis in the LEFT lung base noted.  No pneumothorax.  Musculoskeletal: No acute or suspicious bony abnormalities noted.  CT ABDOMEN PELVIS FINDINGS  Hepatobiliary: The liver is unremarkable. Cholelithiasis noted without CT evidence of acute cholecystitis. No biliary dilatation.  Pancreas: Unremarkable  Spleen: Unremarkable  Adrenals/Urinary Tract: Kidneys, adrenal glands and bladder are unremarkable.  Stomach/Bowel: Stomach is within normal limits. Appendix appears normal. No evidence of bowel wall thickening,  distention, or inflammatory changes.  Vascular/Lymphatic: Aortic atherosclerosis. No enlarged abdominal or pelvic lymph nodes.  Reproductive: Prostate is unremarkable.  Other: No ascites, focal collection or pneumoperitoneum.  Musculoskeletal: No acute or significant osseous findings.  IMPRESSION: 1. Extensive hypodense heterogeneity of the RIGHT middle lobe with moderate loculated RIGHT pleural effusion as discussed. This may represent extensive RIGHT middle lobe infection with empyema, but malignancy is difficult to entirely exclude. Borderline high RIGHT paratracheal lymph node. 2. LEFT basilar atelectasis versus consolidation. 3. No acute abnormality within the abdomen or pelvis. 4. Cardiomegaly and coronary artery disease. 5.  Aortic Atherosclerosis (ICD10-I70.0).   Electronically Signed   By: Margarette Canada M.D.   On: 11/26/2018 15:14    EXAM: PORTABLE CHEST 1 VIEW  COMPARISON:  11/27/2018.  FINDINGS: Three right chest tubes in stable position. No pneumothorax. Stable right pleural thickening/small effusion. Stable right base atelectasis/infiltrate. Left base atelectasis/infiltrate small left pleural effusion again noted. Heart size stable. No acute bony abnormality.  IMPRESSION: 1. Three right chest tubes in stable position with stable right base pleural thickening/small effusion. Persistent right base atelectasis/infiltrate. 2. Left base atelectasis/infiltrate and small left pleural effusion again noted.   Electronically Signed   By: Marcello Moores  Register   On: 11/29/2018 07:58    EXAM: PORTABLE CHEST 1 VIEW -DAY of DISCHARGE  COMPARISON:  Chest x-rays dated 12/03/2018 and 12/02/2018  FINDINGS: Heart size and mediastinal contours are stable. RIGHT-sided chest tubes have been removed. Persistent opacity at the RIGHT lung base. LEFT lung remains clear. No pneumothorax seen.  IMPRESSION: 1.  Interval removal of the RIGHT-sided chest tubes.  No pneumothorax seen. 2. Persistent opacity at the RIGHT lung base, stable compared to yesterday's exam, corresponding to the region of consolidation and loculated fluid collections demonstrated on earlier chest CT of 11/26/2018.   Electronically Signed   By: Franki Cabot M.D.   On: 12/04/2018 08:23   Treatments: Surgery  CARDIOTHORACIC SURGERY OPERATIVE NOTE  Date of Procedure:                            11/26/2018  Preoperative Diagnosis:                  Community - acquired pneumonia with right empyema  Postoperative Diagnosis:                same  Procedure:                   Video Bronchoscopy with Endobronchial Lavage  Video-assisted Thoracoscopy for Drainage of Empyema              Surgeon:                                            Valentina Gu. Roxy Manns, MD  Assistant:                                           John Giovanni, PA-C  Anesthesia:                                        Albertha Ghee, MD  Operative Findings:                          Acute suppurative empyema     BRIEF CLINICAL NOTE AND INDICATIONS FOR SURGERY  Patient is a 58 year old male with history of COPD, longstanding heavy tobacco abuse and alcohol abuse who presented to the emergency department with a progressive 2-week history of fevers, productive cough, and right-sided pleuritic chest pain and underwent chest x-ray and CT scan which are diagnostic of community-acquired pneumonia with empyema.  The patient has been seen in consultation and counseled at length regarding the indications, risks and potential benefits of surgery.  All questions have been answered, and the patient provides full informed consent for the operation as described.  Discharge Exam: Blood pressure 137/80, pulse 95, temperature 98.3 F (36.8 C), temperature source Oral, resp. rate 19, height 5\' 8"  (1.727 m), weight 70.7 kg, SpO2 98 %.  General appearance:alert, cooperative  andnodistress Neurologic:intact Heart:Sinus tachycardia.This is mild and stable. Lungs:Breath sounds clear posterior, still diminished right base.  CXR is stable post CT removal. Wound: Right chest incision and CT site dressing are dry.  Disposition:    Allergies as of 12/04/2018      Reactions   Penicillins Other (See Comments)   Tolerated Zosyn 11/2018 Has patient had a PCN reaction causing immediate rash, facial/tongue/throat swelling, SOB or lightheadedness with hypotension: childhood allergy Has patient had a PCN reaction causing severe rash involving mucus membranes or skin necrosis: childhood allergy Has patient had a PCN reaction that required hospitalization  childhood allergy Has patient had a PCN reaction occurring within the last 10 years: childhood allergy If all of the above answers are "NO", then may proceed wit      Medication List    STOP taking these medications   Goodys Extra Strength 500-325-65 MG Pack Generic drug: Aspirin-Acetaminophen-Caffeine     TAKE these medications   cefdinir 300 MG capsule Commonly known as: OMNICEF Take 1 capsule (300 mg total) by mouth 2 (two) times daily.   ferrous [id number] C-folic acid capsule Commonly known as: TRINSICON / FOLTRIN Take 1 capsule by mouth 3 (three) times daily after meals for 30 days.   metoprolol tartrate 25 MG tablet Commonly known as: LOPRESSOR Take 1 tablet (25 mg total) by mouth 2 (two) times daily.   nicotine 21 mg/24hr patch Commonly known as: NICODERM CQ - dosed in mg/24 hours Place 1 patch (21 mg total) onto the skin daily for 14 days. Start taking on: December 05, 2018   oxyCODONE-acetaminophen 5-325 MG tablet Commonly known as: Percocet Take 1 tablet by mouth every 4 (four) hours as needed for up to 5 days for severe pain.      Follow-up Information    [facility] [place]. Go on 12/19/2018.   Specialty: Cardiothoracic Surgery Why: You  have an appointment at Dr. Guy Sandifer office on Monday 12/19/2018 at 3:30 pm. Please arrive 30 min early for a chest x-ray to be done at [facility] located on the 1st floor of the same building. Contact information: [place], [place] [phone]          Signed: Antony Odea, PA-C 12/04/2018, 11:31 AM

## 2018-11-30 NOTE — Progress Notes (Signed)
   Subjective: Patient was seen and evaluated at bedside on morning rounds. No acute events overnight. No other acute complaints. Patient has had 7-8 loose stools, breathing better, hitting about 1000 on incentive spirometer. He mentions that he is doing better. Still has some cough with sputum but pain is better.  Objective:  Vital signs in last 24 hours: Vitals:   11/30/18 0300 11/30/18 0400 11/30/18 0500 11/30/18 0600  BP:  (!) 147/81  (!) 142/91  Pulse:      Resp: (!) 27 (!) 26 (!) 31 (!) 32  Temp:  98.2 F (36.8 C)    TempSrc:  Oral    SpO2:  93%    Weight:      Height:       Physical Exam:  VS reviewed, nursing notes reviewed. Tele reviewed. General: Sitting in the bed in no acute distress CV: Tachycardic, no murmur, no LEE Pulm and chest: No respiratory distress, chest tube is in place. CTA bilateraly Abdomen: soft, non tender to palpation Neurologic exam: Alert and oriented x 3  Assessment/Plan:  Principal Problem:   Empyema of right pleural space (HCC) Active Problems:   CAP (community acquired pneumonia)   Alcohol abuse   Tobacco abuse   Poor dentition   Empyema, right (HCC)  CAP complicated by acute superativeempyema s/p VATS and BAL 6/20,POD 4: Clinically improving.  Patient appears to be doing much better today.  He has been afebrile. He has tachycardia on tele. Asymptomatic. Can be due to dehydration. Will monitor.  Leukocytosis trended down to 12. BAL culture 6/20 growing gram-positive cocci in chains on Gram stain, culture with few streptococcus Intermedius.  Repeated Chest x-ray 6/23.  Stable right base pleural thickening/small effusion. Persistent right base atelectasis/infiltrate. Left base atelectasis/infiltrate and small left pleural effusion again noted. On Cefepime since 11/26/2018 Discontinued vancomycin 6/22, since MRSA PCR came back negative.  -Continue cefepime. May switch when sensitivity results come back. -Continue pain control  with Toradol and Oxycodone and Morphine PRN (only required 1 dose of morphine. Decreased frequency) -ContinueLovenox for PPX -EKG--> Sinus tachycardia.   Diarrhea: No abdominal pain, afebrile, no leukocytosis.  No evidence of C-diff at this point but will keep a close eye on it. Asked the nurse to notify MD if diarrhea continued. Can also be in setting of scheduled Senna  -DC Senna -Notify MD if diarrhea continued. Will need to test for C-diff in that case.   Hypotonic hyponatremia: Na stayed at 131 The patient sodium on admission was 128.Urine sodium 49, urine osmolality 291, osmolality 269. Na 131 today.  -continue fluid restriction. -BMP daily  Microcytic anemia, IDA: Thrombocytosis Hb stable at 8.1 today Platelets 747 on admission now 601 Iron deficiency anemia: Hemoglobin 7.8.Saturation: 3%, ferritin 105 (can be falsely elevated due to acute illness)  -Hold off of iron due to active infection -Considerpathologist reviewed peripheral blood smearif persists  Alcohol use disorder  Drinks 12 beers daily and has had withdrawal symptoms prior. Last etoh morning 6/20.Significant other states that he the patient has had several hospitalizations during which he had withdrawals.  No further withdrawal. Last CIWA 4->1>1>0>0 Received 1 dose of Ativan yesterday.  -Will give one dose of Librium -Continue withCIWA with ativan  -DecreasedLibriumto35mQD today and DC tomorrow -CtMultivitamins  -CtFolic acid 140mqd  -CtThiamine 10021md   Dispo: Anticipated discharge depends on clinical improvement and stability  MasDewayne HatchD 11/30/2018, 7:43 AM Pager: [phone]

## 2018-11-30 NOTE — Progress Notes (Addendum)
4 Days Post-Op Procedure(s) (LRB): VIDEO ASSISTED THORACOSCOPY (VATS)/EMPYEMA (Right) Video Bronchoscopy Subjective: Says he is more comfortable today. No new concerns.   Objective: Vital signs in last 24 hours: Temp:  [98.1 F (36.7 C)-98.4 F (36.9 C)] 98.4 F (36.9 C) (06/24 0744) Pulse Rate:  [105] 105 (06/23 1400) Cardiac Rhythm: Sinus tachycardia (06/24 0800) Resp:  [17-32] 23 (06/24 0800) BP: (113-158)/(68-91) 113/71 (06/24 0800) SpO2:  [89 %-94 %] 93 % (06/24 0828)     Intake/Output from previous day: 06/23 0701 - 06/24 0700 In: 1078.9 [P.O.:840; I.V.:38.8; IV Piggyback:200.1] Out: 2242 [Urine:2050; [place]; Chest Tube:190] Intake/Output this shift: Total I/O In: 100 [IV Piggyback:100] Out: -    General appearance: alert, cooperative and moderate distress Neurologic: intact Heart: Sinus tachycardia. Lungs: Breath sounds clear posterior, still diminished right base. CT drainage 170ml thin serous fluid past 24 hours.  No air leak. Wound: Chest tubes secured, Right chest incision dressing is dry.    Lab Results: Recent Labs    11/29/18 0326 11/30/18 0240  WBC 12.0* 12.1*  HGB 8.0* 8.1*  HCT 27.2* 27.3*  PLT 564* 601*   BMET:  Recent Labs    11/29/18 0326 11/30/18 0240  NA 130* 131*  K 4.1 4.1  CL 93* 92*  CO2 27 27  GLUCOSE 107* 90  BUN 9 10  CREATININE 0.59* 0.66  CALCIUM 8.3* 8.3*    PT/INR: No results for input(s): LABPROT, INR in the last 72 hours. ABG    Component Value Date/Time   PHART 7.427 11/27/2018 0555   HCO3 24.5 11/27/2018 0555   TCO2 26 11/27/2018 0555   O2SAT 95.0 11/27/2018 0555   CBG (last 3)  No results for input(s): GLUCAP in the last 72 hours.  Assessment/Plan: S/P Procedure(s) (LRB): VIDEO ASSISTED THORACOSCOPY (VATS)/EMPYEMA (Right) Video Bronchoscopy  -POD4 bronch, right VATS for drainage of empyema from CAP. Respiratory status stable. Pain controlled.  Pleural fluid Cx grew strept species, sensitivities  pending.   Continue cefepime for now. Repeat CXR in am. Continue to encourage pulmonary hygiene, nebs. Transfer to [facility] when bed available.  Leave CT's in place for drainage.  -History of EtOH abuse- CIWA protocol and thiamine ordered.  -DVT PPX- add Lovenox   LOS: 4 days    Antony Odea, PA-C [phone] 11/30/2018   I have seen and examined the patient and agree with the assessment and plan as outlined.  Rexene Alberts, MD 11/30/2018 12:40 PM

## 2018-11-30 NOTE — Progress Notes (Addendum)
EVENING ROUNDS NOTE :     [address]       [place] [address]             [phone]                 4 Days Post-Op Procedure(s) (LRB): VIDEO ASSISTED THORACOSCOPY (VATS)/EMPYEMA (Right) Video Bronchoscopy  Total Length of Stay:  LOS: 4 days  BP (!) 143/73   Pulse (!) 105   Temp 98.5 F (36.9 C) (Oral)   Resp (!) 23   Ht 5\' 8"  (1.727 m)   Wt 70.7 kg   SpO2 93%   BMI 23.70 kg/m   .Intake/Output      06/24 0701 - 06/25 0700   P.O.    I.V. (mL/kg)    IV Piggyback 100   Total Intake(mL/kg) 100 (1.4)   Urine (mL/kg/hr) 375 (0.4)   Stool    Chest Tube 150   Total Output 525   Net -425         . ceFEPime (MAXIPIME) IV 2 g (11/30/18 1611)  . dextrose 5 % and 0.9 % NaCl with KCl 20 mEq/L Stopped (11/29/18 1616)     Lab Results  Component Value Date   WBC 12.1 (H) 11/30/2018   HGB 8.1 (L) 11/30/2018   HCT 27.3 (L) 11/30/2018   PLT 601 (H) 11/30/2018   GLUCOSE 90 11/30/2018   ALT 15 11/27/2018   AST 17 11/27/2018   NA 131 (L) 11/30/2018   K 4.1 11/30/2018   CL 92 (L) 11/30/2018   CREATININE 0.66 11/30/2018   BUN 10 11/30/2018   CO2 27 11/30/2018   TSH 1.290 07/12/2014   INR 1.2 11/26/2018   HGBA1C 5.1 03/18/2011    Asleep, stable day 150 cc CT output today On Cefipime, Sensitivities remain pending Continue current care  Caryn Bee Office [phone] 11/30/2018 7:14 PM   I have seen and examined the patient and agree with the assessment and plan as outlined.  Rexene Alberts, MD 11/30/2018 7:20 PM

## 2018-11-30 NOTE — Discharge Instructions (Signed)

## 2018-12-01 ENCOUNTER — Inpatient Hospital Stay (HOSPITAL_COMMUNITY): Payer: Medicaid Other

## 2018-12-01 LAB — CBC
HCT: 27.4 % — ABNORMAL LOW (ref 39.0–52.0)
Hemoglobin: 7.9 g/dL — ABNORMAL LOW (ref 13.0–17.0)
MCH: 22 pg — ABNORMAL LOW (ref 26.0–34.0)
MCHC: 28.8 g/dL — ABNORMAL LOW (ref 30.0–36.0)
MCV: 76.3 fL — ABNORMAL LOW (ref 80.0–100.0)
Platelets: 618 10*3/uL — ABNORMAL HIGH (ref 150–400)
RBC: 3.59 MIL/uL — ABNORMAL LOW (ref 4.22–5.81)
RDW: 19.1 % — ABNORMAL HIGH (ref 11.5–15.5)
WBC: 12.8 10*3/uL — ABNORMAL HIGH (ref 4.0–10.5)
nRBC: 0.2 % (ref 0.0–0.2)

## 2018-12-01 LAB — AEROBIC/ANAEROBIC CULTURE W GRAM STAIN (SURGICAL/DEEP WOUND)

## 2018-12-01 LAB — C DIFFICILE QUICK SCREEN W PCR REFLEX
C Diff antigen: NEGATIVE
C Diff interpretation: NOT DETECTED
C Diff toxin: NEGATIVE

## 2018-12-01 LAB — BASIC METABOLIC PANEL
Anion gap: 11 (ref 5–15)
BUN: 11 mg/dL (ref 6–20)
CO2: 26 mmol/L (ref 22–32)
Calcium: 8.4 mg/dL — ABNORMAL LOW (ref 8.9–10.3)
Chloride: 93 mmol/L — ABNORMAL LOW (ref 98–111)
Creatinine, Ser: 0.66 mg/dL (ref 0.61–1.24)
GFR calc Af Amer: >60 mL/min (ref >60)
GFR calc non Af Amer: >60 mL/min (ref >60)
Glucose, Bld: 90 mg/dL (ref 70–99)
Potassium: 4.3 mmol/L (ref 3.5–5.1)
Sodium: 130 mmol/L — ABNORMAL LOW (ref 135–145)

## 2018-12-01 LAB — CULTURE, BLOOD (ROUTINE X 2)
Culture: NO GROWTH
Culture: NO GROWTH
Special Requests: ADEQUATE
Special Requests: ADEQUATE

## 2018-12-01 LAB — TSH: TSH: 1.831 u[IU]/mL (ref 0.350–4.500)

## 2018-12-01 MED ORDER — LORAZEPAM 2 MG/ML IJ SOLN: 2.0000 mg | INTRAMUSCULAR | Status: DC | PRN

## 2018-12-01 MED ORDER — SODIUM CHLORIDE 0.9 % IV SOLN
2.0000 g | INTRAVENOUS | Status: DC
  Administered 2018-12-01 – 2018-12-04 (×4): 2 g via INTRAVENOUS
  Filled 2018-12-01: qty 20
  Filled 2018-12-01 (×3): qty 2

## 2018-12-01 NOTE — Progress Notes (Signed)
Transferred-in from [facility] ambulatory. Right chest tube  Intact. Denied any discomfort.

## 2018-12-01 NOTE — Progress Notes (Signed)
Physical Therapy Treatment Patient Details Name: [name] MRN: [id number] DOB: April 27, 1961 Today's Date: 12/01/2018    History of Present Illness Patient is a 58 year old male with history of COPD, longstanding heavy tobacco abuse and alcohol abuse who presented to the emergency department with a progressive 2-week history of fevers, productive cough, and right-sided pleuritic chest pain and underwent chest x-ray and CT scan which are diagnostic of community-acquired pneumonia with empyema. Pt underwent a R VATS.    PT Comments    Pt admitted with above diagnosis. Pt currently with functional limitations due to balance and endurance deficits. Pt was able to ambulate with RW with min guard assist and incr distance around the entire unit x 2.  Needs cues for safety at times due to impulsivity.  Will continue to follow acutely.  Making good progress.  Pt will benefit from skilled PT to increase their independence and safety with mobility to allow discharge to the venue listed below.     Follow Up Recommendations  No PT follow up;Supervision/Assistance - 24 hour     Equipment Recommendations  None recommended by PT    Recommendations for Other Services       Precautions / Restrictions Precautions Precautions: Fall Precaution Comments: R chest tube Restrictions Weight Bearing Restrictions: Yes RUE Weight Bearing: Partial weight bearing RUE Partial Weight Bearing Percentage or Pounds: No pushing off to stand/TAVR surgery LUE Weight Bearing: Partial weight bearing LUE Partial Weight Bearing Percentage or Pounds: No pushing off to stand/TAVR surgery    Mobility  Bed Mobility Overal bed mobility: Needs Assistance Bed Mobility: Supine to Sit     Supine to sit: Supervision     General bed mobility comments: supervision and assist for lines and tubes only, no physical assist needed, verbal cues for safety due to impulsivity and not watching for lines  Transfers Overall transfer  level: Needs assistance Equipment used: None Transfers: Sit to/from Stand Sit to Stand: Min guard         General transfer comment: min guard for safety due to impulsivity but no physical assist  Ambulation/Gait Ambulation/Gait assistance: Min guard Gait Distance (Feet): 740 Feet Assistive device: Rolling walker (2 wheeled) Gait Pattern/deviations: Step-through pattern;Decreased stride length Gait velocity: dec compared to normal Gait velocity interpretation: 1.31 - 2.62 ft/sec, indicative of limited community ambulator General Gait Details: no episodes of LOB, good walker managemnet, SPO2 at >88% on RA, denies extensive SOB   Stairs             Wheelchair Mobility    Modified Rankin (Stroke Patients Only)       Balance Overall balance assessment: Mild deficits observed, not formally tested                                          Cognition Arousal/Alertness: Awake/alert Behavior During Therapy: WFL for tasks assessed/performed Overall Cognitive Status: Within Functional Limits for tasks assessed                                 General Comments: pt impulsive with decreased safety awareness however suspect this to be baseline personality      Exercises      General Comments        Pertinent Vitals/Pain Pain Assessment: No/denies pain    Home Living  Prior Function            PT Goals (current goals can now be found in the care plan section) Acute Rehab PT Goals Patient Stated Goal: home soon Progress towards PT goals: Progressing toward goals    Frequency    Min 3X/week      PT Plan Current plan remains appropriate    Co-evaluation              AM-PAC PT "6 Clicks" Mobility   Outcome Measure  Help needed turning from your back to your side while in a flat bed without using bedrails?: None Help needed moving from lying on your back to sitting on the side of a flat bed  without using bedrails?: None Help needed moving to and from a bed to a chair (including a wheelchair)?: None Help needed standing up from a chair using your arms (e.g., wheelchair or bedside chair)?: A Little Help needed to walk in hospital room?: A Little Help needed climbing 3-5 steps with a railing? : A Little 6 Click Score: 21    End of Session Equipment Utilized During Treatment: Gait belt Activity Tolerance: Patient tolerated treatment well Patient left: in bed;with call bell/phone within reach;with bed alarm set Nurse Communication: Mobility status PT Visit Diagnosis: Unsteadiness on feet (R26.81);Difficulty in walking, not elsewhere classified (R26.2)     Time: 2706-2376 PT Time Calculation (min) (ACUTE ONLY): 21 min  Charges:  $Gait Training: 8-22 mins                     [place] Pager:  [phone]  Office:  [place] 12/01/2018, 9:54 AM

## 2018-12-01 NOTE — Progress Notes (Signed)
Pt has not had a liquid stool since my shift. RN will continue to monitor for stool sample.

## 2018-12-01 NOTE — Progress Notes (Addendum)
[facility] DAILY ICU PROGRESS NOTE                   [address]            [place] [address]          [phone]   5 Days Post-Op Procedure(s) (LRB): VIDEO ASSISTED THORACOSCOPY (VATS)/EMPYEMA (Right) Video Bronchoscopy  Total Length of Stay:  LOS: 5 days   Subjective: Pt has remained in the ICU due to bed shortage in progressive care.  He is alert and oriented and comfortable. Reports frequent loose stools.    Objective: Vital signs in last 24 hours: Temp:  [98 F (36.7 C)-98.8 F (37.1 C)] 98.8 F (37.1 C) (06/25 0751) Pulse Rate:  [108-128] 123 (06/25 0700) Cardiac Rhythm: Sinus tachycardia (06/25 0751) Resp:  [23-36] 29 (06/25 0700) BP: (122-169)/(66-92) 161/90 (06/25 0405) SpO2:  [89 %-99 %] 99 % (06/25 0700)  Filed Weights   11/26/18 1300 11/26/18 2120  Weight: 72.6 kg 70.7 kg    Weight change:    Hemodynamic parameters for last 24 hours:    Intake/Output from previous day: 06/24 0701 - 06/25 0700 In: 1047.6 [P.O.:480; I.V.:114.2; IV Piggyback:453.4] Out: 765 [Urine:525; Chest Tube:240]  Intake/Output this shift: Total I/O In: -  Out: 160 [Urine:150; Chest Tube:10]  Current Meds: Scheduled Meds: . acetaminophen  1,000 mg Oral Q6H   Or  . acetaminophen (TYLENOL) oral liquid 160 mg/5 mL  1,000 mg Oral Q6H  . bisacodyl  10 mg Oral Daily  . Chlorhexidine Gluconate Cloth  6 each Topical Q0600  . enoxaparin (LOVENOX) injection  40 mg Subcutaneous Q24H  . feeding supplement (ENSURE ENLIVE)  237 mL Oral TID BM  . ferrous [id number] C-folic acid  1 capsule Oral TID PC  . multivitamin with minerals  1 tablet Oral Daily  . nicotine  21 mg Transdermal Daily  . thiamine  100 mg Oral Daily   Continuous Infusions: . ceFEPime (MAXIPIME) IV 2 g (12/01/18 2355)  . dextrose 5 % and 0.9 % NaCl with KCl 20 mEq/L 10 mL/hr at 12/01/18 0600   PRN Meds:.acetaminophen **OR** acetaminophen, ipratropium-albuterol, LORazepam, morphine injection,  ondansetron (ZOFRAN) IV, oxyCODONE, traMADol  General appearance:alert, cooperative and moderate distress Neurologic:intact Heart:Sinus tachycardia. Lungs:Breath sounds clear posterior, still diminished right base. CT drainage 246ml thin serous fluid past 24 hours. No air leak. Wound:Chest tubes secured, Right chest incision dressing is dry.  Lab Results: CBC: Recent Labs    11/30/18 0240 12/01/18 0234  WBC 12.1* 12.8*  HGB 8.1* 7.9*  HCT 27.3* 27.4*  PLT 601* 618*   BMET:  Recent Labs    11/30/18 0240 12/01/18 0234  NA 131* 130*  K 4.1 4.3  CL 92* 93*  CO2 27 26  GLUCOSE 90 90  BUN 10 11  CREATININE 0.66 0.66  CALCIUM 8.3* 8.4*    CMET: Lab Results  Component Value Date   WBC 12.8 (H) 12/01/2018   HGB 7.9 (L) 12/01/2018   HCT 27.4 (L) 12/01/2018   PLT 618 (H) 12/01/2018   GLUCOSE 90 12/01/2018   ALT 15 11/27/2018   AST 17 11/27/2018   NA 130 (L) 12/01/2018   K 4.3 12/01/2018   CL 93 (L) 12/01/2018   CREATININE 0.66 12/01/2018   BUN 11 12/01/2018   CO2 26 12/01/2018   TSH 1.290 07/12/2014   INR 1.2 11/26/2018   HGBA1C 5.1 03/18/2011      PT/INR: No results for input(s): LABPROT, INR  in the last 72 hours. Radiology: Dg Chest Port 1 View  Result Date: 12/01/2018 CLINICAL DATA:  Empyema. EXAM: PORTABLE CHEST 1 VIEW COMPARISON:  Radiograph November 29, 2018. FINDINGS: Stable cardiomegaly. Atherosclerosis of thoracic aorta is noted. Stable position of 3 right-sided chest tubes. No pneumothorax is noted. Stable right pleural effusion is noted with adjacent atelectasis or infiltrate. Small left pleural effusion is noted with associated atelectasis or infiltrate. Bony thorax is unremarkable. IMPRESSION: Stable position of 3 right-sided chest tubes are noted without pneumothorax. Stable right pleural effusion with associated atelectasis or infiltrate. Small left pleural effusion is noted with associated atelectasis or infiltrate. Aortic Atherosclerosis  (ICD10-I70.0). Electronically Signed   By: Marijo Conception M.D.   On: 12/01/2018 07:02     Assessment/Plan: S/P Procedure(s) (LRB): VIDEO ASSISTED THORACOSCOPY (VATS)/EMPYEMA (Right) Video Bronchoscopy   -POD5 bronch, right VATS for drainage of empyema from CAP. Respiratory status stable. Pain controlled. Pleural fluid Cx grew strept species, sensitivities pending.   ABX adjusted from cefepime to ceftriaxone per internal medicine.  Will remove anterior tube today. Repeat CXR in am. Continue to encourage pulmonary hygiene, nebs. Transfer to [facility] when bed available.  -Frequent loose stools- C.Diff assay ordered per internal medicine.   -History of EtOH abuse- CIWA protocol and thiamine ordered.  -DVT PPX- Lovenox    Antony Odea, PA-C [phone] 12/01/2018 8:10 AM    I have seen and examined the patient and agree with the assessment and plan as outlined.  Still waiting for bed to transfer.  Rexene Alberts, MD 12/01/2018

## 2018-12-01 NOTE — Progress Notes (Addendum)
   Subjective: Patient reports that he is still having some diarrhea. No abdominal pain. He is still having a cough but feels much better today. Denies any pain at this time. Denies any palpitation, heart racing.  Objective:  Vital signs in last 24 hours: Vitals:   12/01/18 0200 12/01/18 0300 12/01/18 0400 12/01/18 0405  BP:   (!) 169/86 (!) 161/90  Pulse:      Resp: (!) 25 (!) 29 (!) 36 (!) 35  Temp:   98.8 F (37.1 C)   TempSrc:   Oral   SpO2:   (!) 89% 96%  Weight:      Height:       General: Well appearing male, NAD, sitting in bed Cardiac: Tachycardic, regular rhythm, no murmur, no edema Pulm and  chest: This tube is in place, no swelling and no local tenderness in chest tube insertion area.  No crackle Skin: No rash  Assessment/Plan:  Principal Problem:   Empyema of right pleural space (HCC) Active Problems:   CAP (community acquired pneumonia)   Alcohol abuse   Tobacco abuse   Poor dentition   Empyema, right (HCC)  CAP complicated by acute superativeempyema s/p VATS and BAL 6/20,POD4: Improving and feels much better. Still has diarrhea. Afebrile and leukocytosis trended down, remained at 12. Still has diarrhea. No abdominal pain, no fever though. Will check C-diff Still has asymptomatic sinus tachycardia, likely due to dehydration, current infection, pain, ...will monitor.   BAL culture 6/20 growing gram-positive cocci in chains on Gram stain, culture with few streptococcus Intermedius.   Discontinuedvancomycin 6/22,since MRSA PCR came back negative. On Cefepime since 11/26/2018.  Will switch Cefepime to Ceftriaxone, since responded well clinically to cephalosporin so far and not concerning for pseudomonas.   -Switch cefepime to ceftriaxone today -Continue pain control current medication -ContinueLovenox for PPX -Continue cardiac monitoring -CXR today per CT surgery -checking C-diff  Hypotonic hyponatremia The patient sodium on admission was  128.Urine sodium 49, urine osmolality 291, osmolality 269. Na stable at130 today.  -continue fluid restriction.  Microcytic anemia Thrombocytosis Hb stable at 7.9 today Platelets 747 on admission now improved to 565. Iron deficiency anemia: Hemoglobin 7.8.Saturation: 3%, ferritin 105 (can be falsely elevated due to acute illness)  -Hold off of iron due to active infection -Considerpathologist reviewed peripheral blood smearif persists -CBC daily  Alcohol use disorder  Drinks 12 beers daily and has had withdrawal symptoms prior. Last etoh morning 6/20.Significant other states that he the patient has had several hospitalizations during which he had withdrawals. Last CIWA 1-->3 No Ativan required since yesterday  -Continue withCIWA with ativan -StopLibrium -CtMultivitamins  -CtFolic acid 49m qd  -CtThiamine 1075mqd  Diet: regular IV fluid: fluid restriction VTE ppx: Lovenox Code status: Full  Dispo: Anticipated discharge in approximately 3-4 days   MaDewayne HatchMD 12/01/2018, 6:00 AM Pager: _0 @

## 2018-12-02 ENCOUNTER — Inpatient Hospital Stay (HOSPITAL_COMMUNITY): Payer: Medicaid Other

## 2018-12-02 LAB — CBC
HCT: 26.2 % — ABNORMAL LOW (ref 39.0–52.0)
Hemoglobin: 7.8 g/dL — ABNORMAL LOW (ref 13.0–17.0)
MCH: 22.4 pg — ABNORMAL LOW (ref 26.0–34.0)
MCHC: 29.8 g/dL — ABNORMAL LOW (ref 30.0–36.0)
MCV: 75.3 fL — ABNORMAL LOW (ref 80.0–100.0)
Platelets: 617 10*3/uL — ABNORMAL HIGH (ref 150–400)
RBC: 3.48 MIL/uL — ABNORMAL LOW (ref 4.22–5.81)
RDW: 18.7 % — ABNORMAL HIGH (ref 11.5–15.5)
WBC: 14.1 10*3/uL — ABNORMAL HIGH (ref 4.0–10.5)
nRBC: 0 % (ref 0.0–0.2)

## 2018-12-02 LAB — BASIC METABOLIC PANEL
Anion gap: 12 (ref 5–15)
BUN: 5 mg/dL — ABNORMAL LOW (ref 6–20)
CO2: 25 mmol/L (ref 22–32)
Calcium: 8.4 mg/dL — ABNORMAL LOW (ref 8.9–10.3)
Chloride: 91 mmol/L — ABNORMAL LOW (ref 98–111)
Creatinine, Ser: 0.52 mg/dL — ABNORMAL LOW (ref 0.61–1.24)
GFR calc Af Amer: >60 mL/min (ref >60)
GFR calc non Af Amer: >60 mL/min (ref >60)
Glucose, Bld: 91 mg/dL (ref 70–99)
Potassium: 4.1 mmol/L (ref 3.5–5.1)
Sodium: 128 mmol/L — ABNORMAL LOW (ref 135–145)

## 2018-12-02 MED ORDER — METOPROLOL TARTRATE 25 MG PO TABS
25.0000 mg | ORAL_TABLET | Freq: Two times a day (BID) | ORAL | Status: DC
  Administered 2018-12-02 – 2018-12-04 (×5): 25 mg via ORAL
  Filled 2018-12-02 (×5): qty 1

## 2018-12-02 MED ORDER — SODIUM CHLORIDE 0.9 % IV SOLN
INTRAVENOUS | Status: AC
  Administered 2018-12-02: 09:00:00 via INTRAVENOUS

## 2018-12-02 MED ORDER — LACTATED RINGERS IV SOLN: INTRAVENOUS | Status: DC

## 2018-12-02 MED ORDER — OXYCODONE HCL 5 MG PO TABS
5.0000 mg | ORAL_TABLET | Freq: Four times a day (QID) | ORAL | Status: DC | PRN
  Administered 2018-12-02 – 2018-12-04 (×6): 10 mg via ORAL
  Filled 2018-12-02 (×6): qty 2

## 2018-12-02 NOTE — Progress Notes (Addendum)
   Subjective: Patient was seen and evaluated at bedside on morning rounds. laying in his bed this morning. Patient states that he is eating well. Has not had any diarrhea. States that he is doing well. Pain is better. No shortness of breath. Has less cough.  Objective:  Vital signs in last 24 hours: Vitals:   12/01/18 2300 12/01/18 2352 12/02/18 0240 12/02/18 0311  BP:  (!) 150/88  (!) 154/91  Pulse: (!) 129  (!) 127 (!) 117  Resp: 19  14 (!) 22  Temp:  99 F (37.2 C)  99.5 F (37.5 C)  TempSrc:  Oral  Oral  SpO2: 96%  95% 95%  Weight:      Height:       Ph/e: General: Sitting in bed in no acute distress CV: Regular rhythm, tachycardic, no murmur, no edema Pulmonary/chest: Chest tube is in place, no swelling, tenderness at the area, Clear to auscultation Abdomen: Soft, nontender to palpation, BS present Neurologic exam: Alert and oriented x3  Assessment/Plan:  Principal Problem:   Empyema of right pleural space (HCC) Active Problems:   CAP (community acquired pneumonia)   Alcohol abuse   Tobacco abuse   Poor dentition   Empyema, right (HCC)  CAP complicated by acute superativeempyema s/p VATS and BAL 6/20: BAL culture 6/20 growing gram-positive cocci in chains on Gram stain, culturewith few streptococcus Intermedius.   CXR 6/26: Stable chest with stable right chest tubes. Bilateral pleural thickening is unchanged. Bibasilar atelectasis and right base possible infiltrate unchanged. No pneumothorax.  Antibiotic changes so far: Discontinuedvancomycin 6/22,since MRSA PCR came back negative. Was on Cefepime 11/26/2018.- 6/25  Switched Cefepime to Ceftriaxone 6/25 sinceresponded well clinically to cephalosporin so far andnot concerning for pseudomonas.  A& P: Pain has been improved.  How ever, he is still tachycardic (sinus tach).  Can be likely due to dehydration secondary to diarrhea. Will give some IV F. Diarrhea resolved today.  C. difficile was negative.  leukocytosis increased to 14 today comparing to 12 prior days.  He is afebrile and clinically improving.  We will continue ceftriaxone and we will monitor him.  -Continue Ceftriaxone  -Continue pain controlcurrent medication -ContinueLovenox for PPX -Continue cardiac monitoring  Hypotonic hyponatremia The patient sodium on admission was 128.Urine sodium 49, urine osmolality 291, osmolality 269-->271. Worse today at 128 -NS 100 ml/h x10 h -Will give maintenance NS 100 ml/ for dehydration and in hope to improve hyponatremia and also tachycardia since seems to be dehydrated now after 2 dose of diarrhea.  Microcytic anemia Thrombocytosis Hb stable at7.8today Platelets 747 on admission, now 617 Iron deficiency anemia: Hemoglobin 7.8.Saturation: 3%, ferritin 105 (can be falsely elevated due to acute illness)  -Is on Po iro/vitamin pill. Hold off of IV iron due to active infection -Considerpathologist reviewed peripheral blood smearif persists -CBC daily  Alcohol use disorder : Drinks 12 beers daily and has had withdrawal symptoms prior. Last etoh morning 6/20.Significant other states that he the patient has had several hospitalizations during which he had withdrawals.  No withdrawal. Last CIWA0 NoAtivan required yesterday Librium stopped  -Continue withCIWA with PRN ativan -CtMultivitamins  -CtFolic acid 57m qd  -CtThiamine 1027mqd  Dispo: Anticipated discharge in approximately 3-4 days  MaDewayne HatchMD 12/02/2018, 7:21 AM Pager: _0 @

## 2018-12-02 NOTE — Progress Notes (Addendum)
6 Days Post-Op Procedure(s) (LRB): VIDEO ASSISTED THORACOSCOPY (VATS)/EMPYEMA (Right) Video Bronchoscopy Subjective: Resting comfortably, feel like he is progressing.  Loose stools have resolved. C.Diff toxin and antigen negative.   Objective: Vital signs in last 24 hours: Temp:  [98.3 F (36.8 C)-99.5 F (37.5 C)] 98.7 F (37.1 C) (06/26 0733) Pulse Rate:  [101-129] 116 (06/26 0733) Cardiac Rhythm: Sinus tachycardia (06/26 0733) Resp:  [14-34] 34 (06/26 0733) BP: (142-175)/(80-93) 142/86 (06/26 0733) SpO2:  [90 %-96 %] 91 % (06/26 0733)     Intake/Output from previous day: 06/25 0701 - 06/26 0700 In: 758.7 [P.O.:440; I.V.:218.7; IV Piggyback:100] Out: 2040 [Urine:1970; Chest Tube:70] Intake/Output this shift: Total I/O In: 250 [P.O.:240; I.V.:10] Out: 300 [Urine:300]  General appearance:alert, cooperative and moderate distress Neurologic:intact Heart:Sinus tachycardia. Lungs:Breath sounds clear posterior, still diminished right base. CT drainage36ml thin serous fluid past 24 hours. No air leak. Wound:Chest tubes secured, Right chest incision dressing is dry.  Lab Results: Recent Labs    12/01/18 0234 12/02/18 0334  WBC 12.8* 14.1*  HGB 7.9* 7.8*  HCT 27.4* 26.2*  PLT 618* 617*   BMET:  Recent Labs    12/01/18 0234 12/02/18 0334  NA 130* 128*  K 4.3 4.1  CL 93* 91*  CO2 26 25  GLUCOSE 90 91  BUN 11 5*  CREATININE 0.66 0.52*  CALCIUM 8.4* 8.4*    PT/INR: No results for input(s): LABPROT, INR in the last 72 hours. ABG    Component Value Date/Time   PHART 7.427 11/27/2018 0555   HCO3 24.5 11/27/2018 0555   TCO2 26 11/27/2018 0555   O2SAT 95.0 11/27/2018 0555   CBG (last 3)  No results for input(s): GLUCAP in the last 72 hours.  Assessment/Plan: S/P Procedure(s) (LRB): VIDEO ASSISTED THORACOSCOPY (VATS)/EMPYEMA (Right) Video Bronchoscopy  -POD6bronch, right VATS for drainage of empyema from CAP. Respiratory status stable. Pain  controlled.Pleural fluid Cxgrew strept intermedius, sensitivities not reported. Currently on IV ceftriaxone. WBC trending up slightly, re-check in AM.  CXR is stable following removal of anterior tube on 6/25.  CT drainage continues to taper off and expect we will be able to remove remaining tubes in the next 1-2 days. Continue to encourage pulmonary hygiene, nebs.  -Frequent loose stools- C.Diff toxin and antigen negative, says loose stools have resolved.   -History of EtOH abuse- CIWA protocol and thiamine ordered but no overt signs of withdrawal except tachycardia.  -DVT PPX- Lovenox    LOS: 6 days    Antony Odea, PA-C [phone] 12/02/2018   I have seen and examined the patient and agree with the assessment and plan as outlined.  Chest tube output thin, serosanguinous and trending down.  CXR stable w/ RLL opacity likely primarily related to severe consolidation of RLL and RML.  Probably can remove last 2 tubes over next 1-2 days depending on output.  Rexene Alberts, MD 12/02/2018 9:37 AM

## 2018-12-02 NOTE — Progress Notes (Signed)
Offered to ambulate along the hallway  for the second time but  refused, claimed to have  pain on the surgical site, to try later.

## 2018-12-03 ENCOUNTER — Inpatient Hospital Stay (HOSPITAL_COMMUNITY): Payer: Medicaid Other

## 2018-12-03 LAB — BASIC METABOLIC PANEL
Anion gap: 15 (ref 5–15)
BUN: 6 mg/dL (ref 6–20)
CO2: 24 mmol/L (ref 22–32)
Calcium: 8.2 mg/dL — ABNORMAL LOW (ref 8.9–10.3)
Chloride: 89 mmol/L — ABNORMAL LOW (ref 98–111)
Creatinine, Ser: 0.58 mg/dL — ABNORMAL LOW (ref 0.61–1.24)
GFR calc Af Amer: >60 mL/min (ref >60)
GFR calc non Af Amer: >60 mL/min (ref >60)
Glucose, Bld: 99 mg/dL (ref 70–99)
Potassium: 4 mmol/L (ref 3.5–5.1)
Sodium: 128 mmol/L — ABNORMAL LOW (ref 135–145)

## 2018-12-03 LAB — CBC
HCT: 27.1 % — ABNORMAL LOW (ref 39.0–52.0)
Hemoglobin: 7.9 g/dL — ABNORMAL LOW (ref 13.0–17.0)
MCH: 21.8 pg — ABNORMAL LOW (ref 26.0–34.0)
MCHC: 29.2 g/dL — ABNORMAL LOW (ref 30.0–36.0)
MCV: 74.7 fL — ABNORMAL LOW (ref 80.0–100.0)
Platelets: 629 10*3/uL — ABNORMAL HIGH (ref 150–400)
RBC: 3.63 MIL/uL — ABNORMAL LOW (ref 4.22–5.81)
RDW: 18.7 % — ABNORMAL HIGH (ref 11.5–15.5)
WBC: 12.2 10*3/uL — ABNORMAL HIGH (ref 4.0–10.5)
nRBC: 0 % (ref 0.0–0.2)

## 2018-12-03 NOTE — Progress Notes (Signed)
Remaining R chest tube removed. Applied occlusive gauze, 4X4 and tape over site. Pt tolerated the procedure well. No new complain. VS stable. Will continue to monitor pt.

## 2018-12-03 NOTE — Progress Notes (Addendum)
7 Days Post-Op Procedure(s) (LRB): VIDEO ASSISTED THORACOSCOPY (VATS)/EMPYEMA (Right) Video Bronchoscopy Subjective: Says he feels well, no new problems. Pain controlled.   Objective: Vital signs in last 24 hours: Temp:  [98 F (36.7 C)-99.7 F (37.6 C)] 99.7 F (37.6 C) (06/27 0754) Pulse Rate:  [95-128] 112 (06/27 0754) Cardiac Rhythm: Sinus tachycardia (06/27 0700) Resp:  [17-35] 20 (06/27 0754) BP: (135-153)/(75-88) 142/80 (06/27 0754) SpO2:  [87 %-98 %] 95 % (06/27 0754)     Intake/Output from previous day: 06/26 0701 - 06/27 0700 In: 1959.9 [P.O.:750; I.V.:1109.9; IV Piggyback:100] Out: 1240 [Urine:1200; Chest Tube:40] Intake/Output this shift: Total I/O In: 240 [P.O.:240] Out: -   General appearance:alert, cooperative and no distress Neurologic:intact Heart:Sinus tachycardia. This is mild and stable.  Lungs:Breath sounds clear posterior, still diminished right base. CT drainage32ml thin serous fluid past 24 hours. No air leak.  CXR is stable.  Wound:Chest tubes secured, Right chest incision dressing is dry.  Lab Results: Recent Labs    12/02/18 0334 12/03/18 0716  WBC 14.1* 12.2*  HGB 7.8* 7.9*  HCT 26.2* 27.1*  PLT 617* 629*   BMET:  Recent Labs    12/02/18 0334 12/03/18 0716  NA 128* 128*  K 4.1 4.0  CL 91* 89*  CO2 25 24  GLUCOSE 91 99  BUN 5* 6  CREATININE 0.52* 0.58*  CALCIUM 8.4* 8.2*    PT/INR: No results for input(s): LABPROT, INR in the last 72 hours. ABG    Component Value Date/Time   PHART 7.427 11/27/2018 0555   HCO3 24.5 11/27/2018 0555   TCO2 26 11/27/2018 0555   O2SAT 95.0 11/27/2018 0555   CBG (last 3)  No results for input(s): GLUCAP in the last 72 hours.  Assessment/Plan: S/P Procedure(s) (LRB): VIDEO ASSISTED THORACOSCOPY (VATS)/EMPYEMA (Right) Video Bronchoscopy  -POD7bronch, right VATS for drainage of empyema from CAP. Respiratory status stable. Pain controlled. WBC trending back toward normal.   CXR is  stable following removal of anterior tube on 6/25.  CT drainage continues to taper off. Will remove remaining tubes today. Repeat CXR in AM and plan discharge home if stable.  Continue to encourage pulmonary hygiene, nebs.  -Frequent loose stools- C.Diff toxin and antigen negative, says loose stools have resolved.   -History of EtOH abuse- CIWA protocol and thiamine ordered but no overt signs of withdrawal except tachycardia.  -DVT PPX- Lovenox     LOS: 7 days    Antony Odea, PA-C [phone] 12/03/2018 patient examined and medical record reviewed,agree with above note. Tharon Aquas Trigt III 12/03/2018

## 2018-12-03 NOTE — Progress Notes (Signed)
   Subjective: Patient is doing well.  Denies any shortness of breath.  Did not have any further diarrhea.  He mentions that his chest tubes were removed today and his doing well.  Objective:  Vital signs in last 24 hours: Vitals:   12/03/18 0422 12/03/18 0651 12/03/18 0754 12/03/18 1105  BP: (!) 153/85  (!) 142/80 122/79  Pulse: (!) 110 (!) 128 (!) 112 94  Resp: (!) _0 (!) 23  Temp: 98.6 F (37 C)  99.7 F (37.6 C) 98.8 F (37.1 C)  TempSrc: Oral  Oral Oral  SpO2: 90% (!) 87% 95% 97%  Weight:      Height:       General: No acute distress CV: RRR, nl S1S2, no murmur Lungs: Chest tube removed, incision sites with sutures sutures are clean, diffuse rhonchi Neurologic exam: Alert and oriented x3 Abdominal exam: Soft, nontender to palpation  Assessment/Plan:  Principal Problem:   Empyema of right pleural space (HCC) Active Problems:   CAP (community acquired pneumonia)   Alcohol abuse   Tobacco abuse   Poor dentition   Empyema, right (HCC)  CAP complicated by acute superativeempyema s/p VATS and BAL 6/20: Chest tube removed today.  He is clinically stable and improved.  BAL culture 6/20 growing gram-positive cocci in chains on Gram stain, culturewith few streptococcus Intermedius.   CXR 6/26: Stable chest with stable right chest tubes. Bilateral pleural thickening is unchanged. Bibasilar atelectasis and right base possible infiltrate unchanged. No pneumothorax.  Antibiotic changes so far: Discontinuedvancomycin 6/22,since MRSA PCR came back negative. Was on Cefepime 11/26/2018.- 6/25  Switched Cefepime to Ceftriaxone 6/25 sinceresponded well clinically to cephalosporin so far andnot concerning for pseudomonas.  -Continue ceftriaxone today, (started 11/26/2018) we will switch to p.o. Cefdinir tomorrow.  When stable, will discharge with p.o. cefdinir to continue for total course of 14 days. -Continue pain controlcurrent -ContinueLovenox for PPX  -Continue cardiac monitoring -Repeat chest x-ray tomorrow per cardiothoracic surgery  Hypotonic hyponatremia Sodium remained at 128 Worse today at 128 -Continue water restriction today  Microcytic anemia: Hb stable at 7.9  IDA -Continue iro/vitamin pill. Hold off ofIViron due to active infection -Considerpathologist reviewed peripheral blood smearif persists -CBC daily -F/u with PCP and may consider IV irin when infection resolved  Alcohol use disorder: No withdrawals. No Ativan overnight. Last CIWA0  -Continue withCIWA with PRN ativan -CtMultivitamins and Folic acid 59m qd and Thiamine 1065mqd  Dispo: Anticipated discharge in approximately 1-2 days.   MaDewayne HatchMD 12/03/2018, 4:21 PM Pager: _1 @

## 2018-12-04 ENCOUNTER — Inpatient Hospital Stay (HOSPITAL_COMMUNITY): Payer: Medicaid Other

## 2018-12-04 LAB — CBC
HCT: 25.3 % — ABNORMAL LOW (ref 39.0–52.0)
Hemoglobin: 7.7 g/dL — ABNORMAL LOW (ref 13.0–17.0)
MCH: 22.6 pg — ABNORMAL LOW (ref 26.0–34.0)
MCHC: 30.4 g/dL (ref 30.0–36.0)
MCV: 74.2 fL — ABNORMAL LOW (ref 80.0–100.0)
Platelets: 651 10*3/uL — ABNORMAL HIGH (ref 150–400)
RBC: 3.41 MIL/uL — ABNORMAL LOW (ref 4.22–5.81)
RDW: 18.8 % — ABNORMAL HIGH (ref 11.5–15.5)
WBC: 14.7 10*3/uL — ABNORMAL HIGH (ref 4.0–10.5)
nRBC: 0 % (ref 0.0–0.2)

## 2018-12-04 LAB — BASIC METABOLIC PANEL
Anion gap: 13 (ref 5–15)
BUN: 5 mg/dL — ABNORMAL LOW (ref 6–20)
CO2: 25 mmol/L (ref 22–32)
Calcium: 8.3 mg/dL — ABNORMAL LOW (ref 8.9–10.3)
Chloride: 89 mmol/L — ABNORMAL LOW (ref 98–111)
Creatinine, Ser: 0.57 mg/dL — ABNORMAL LOW (ref 0.61–1.24)
GFR calc Af Amer: >60 mL/min (ref >60)
GFR calc non Af Amer: >60 mL/min (ref >60)
Glucose, Bld: 93 mg/dL (ref 70–99)
Potassium: 4.3 mmol/L (ref 3.5–5.1)
Sodium: 127 mmol/L — ABNORMAL LOW (ref 135–145)

## 2018-12-04 MED ORDER — NICOTINE 21 MG/24HR TD PT24: 14.0000 mg | MEDICATED_PATCH | Freq: Every day | TRANSDERMAL | 0 refills | Status: AC

## 2018-12-04 MED ORDER — OXYCODONE-ACETAMINOPHEN 5-325 MG PO TABS: 1.0000 | ORAL_TABLET | ORAL | 0 refills | Status: AC | PRN

## 2018-12-04 MED ORDER — FE FUMARATE-B12-VIT C-FA-IFC PO CAPS: 1.0000 | ORAL_CAPSULE | Freq: Three times a day (TID) | ORAL | 0 refills | Status: AC

## 2018-12-04 MED ORDER — METOPROLOL TARTRATE 25 MG PO TABS: 25.0000 mg | ORAL_TABLET | Freq: Two times a day (BID) | ORAL | 2 refills | Status: DC

## 2018-12-04 MED ORDER — CEFDINIR 300 MG PO CAPS: 300.0000 mg | ORAL_CAPSULE | Freq: Two times a day (BID) | ORAL | 0 refills | Status: DC

## 2018-12-04 NOTE — Progress Notes (Signed)
   Subjective:  [name] stated that he is doing well.  He states that he is going to continue to remain off of alcohol.  He is excited to go home.  He states that he is breathing well and is not having chest pain.  Objective:  Vital signs in last 24 hours: Vitals:   12/03/18 1105 12/03/18 1940 12/03/18 2316 12/04/18 0300  BP: 122/79 (!) 147/79 (!) 146/85 (!) 148/81  Pulse: 94 (!) 104 100 (!) 104  Resp: (!) 23 (!) 21 (!) 29 (!) 22  Temp: 98.8 F (37.1 C) 98.7 F (37.1 C) 98 F (36.7 C) 98.4 F (36.9 C)  TempSrc: Oral Oral Oral Oral  SpO2: 97% 90% 97% 98%  Weight:      Height:       Physical Exam  Constitutional: Appears well-developed and well-nourished. No distress.  HENT:  Head: Normocephalic and atraumatic.  Eyes: Conjunctivae are normal.  Cardiovascular: Normal rate, regular rhythm and normal heart sounds.  Respiratory: Effort normal and breath sounds normal. No respiratory distress. No wheezes.  GI: Soft. Bowel sounds are normal. No distension. There is no tenderness.  Musculoskeletal: No edema.  Neurological: Is alert.  Skin: Not diaphoretic. No erythema.  Psychiatric: Normal mood and affect. Behavior is normal. Judgment and thought content normal.    Assessment/Plan:  Principal Problem:   Empyema of right pleural space (HCC) Active Problems:   CAP (community acquired pneumonia)   Alcohol abuse   Tobacco abuse   Poor dentition   Empyema, right (HCC)  CAP complicated by acute superativeempyema s/p VATS and BAL 6/20 All chest tubes were removed 6/27. Continues to be afebrile.  Patient continues to have some hyponatremia 127, creatinine stable at 0.57, PVC mildly elevated at 14.7, hemoglobin stable at 7.7..  Repeat chest x-ray 6/28 showing stable right lung base opacity, no pneumothorax present.  He is clinically stable and improved.  -Blood culture 6/20 no growth for 5 days -BAL 6/20 moderate gram-positive cocci in chains, culturewith few streptococcus  Intermedius. -Vancomycin 6/20-6/22> cefepime  6/20-6/25> ceftriaxone 6/25-present -Will switch to cefdinir today for total abx duration 14 days (stop date 7/3)  Hypotonic hyponatremia Sodium at 127.  The patient continue fluid restriction at baseline.  He should get his sodium level rechecked at follow-up in 1 week and at that time if he continues to have chronic hyponatremia he should consider salt supplementation.   Iron Deficiency anemia  -May need iron infusion on an outpatient basis  Alcohol use disorder Patient is doing well off alcohol with CIWA 0 and not requiring any Ativan  -ContinueMultivitamins and Folic acid 61m qd and Thiamine 1077mqd  Dispo: Anticipated discharge in approximately today.   ChLars MageMD 12/04/2018, 7:05 AM Pager: [phone]

## 2018-12-04 NOTE — Progress Notes (Signed)
8 Days Post-Op Procedure(s) (LRB): VIDEO ASSISTED THORACOSCOPY (VATS)/EMPYEMA (Right) Video Bronchoscopy Subjective: Feels good, still having some productive cough as expected. Pain well controlled and he is tolerating regular diet.   Objective: Vital signs in last 24 hours: Temp:  [98 F (36.7 C)-98.8 F (37.1 C)] 98.3 F (36.8 C) (06/28 0741) Pulse Rate:  [94-111] 103 (06/28 1013) Cardiac Rhythm: Sinus tachycardia (06/28 0700) Resp:  [21-29] 28 (06/28 0741) BP: (122-154)/(75-98) 135/75 (06/28 1013) SpO2:  [90 %-98 %] 95 % (06/28 0741)     Intake/Output from previous day: 06/27 0701 - 06/28 0700 In: 720 [P.O.:720] Out: 150 [Urine:150] Intake/Output this shift: Total I/O In: 240 [P.O.:240] Out: -   General appearance:alert, cooperative and no distress Neurologic:intact Heart:Sinus tachycardia. This is mild and stable.  Lungs:Breath sounds clear posterior, still diminished right base.  CXR is stable post CT removal.  Wound: Right chest incision and CT site dressing are dry.  Lab Results: Recent Labs    12/03/18 0716 12/04/18 0710  WBC 12.2* 14.7*  HGB 7.9* 7.7*  HCT 27.1* 25.3*  PLT 629* 651*   BMET:  Recent Labs    12/03/18 0716 12/04/18 0710  NA 128* 127*  K 4.0 4.3  CL 89* 89*  CO2 24 25  GLUCOSE 99 93  BUN 6 5*  CREATININE 0.58* 0.57*  CALCIUM 8.2* 8.3*    PT/INR: No results for input(s): LABPROT, INR in the last 72 hours. ABG    Component Value Date/Time   PHART 7.427 11/27/2018 0555   HCO3 24.5 11/27/2018 0555   TCO2 26 11/27/2018 0555   O2SAT 95.0 11/27/2018 0555   CBG (last 3)  No results for input(s): GLUCAP in the last 72 hours.  Assessment/Plan: S/P Procedure(s) (LRB): VIDEO ASSISTED THORACOSCOPY (VATS)/EMPYEMA (Right) Video Bronchoscopy  -POD8bronch, right VATS for drainage of empyema from CAP. Respiratory status stable. Pain controlled. WBC back to 14K today.  CXR is stable following removal of the remaining chest tube  yesterday.   Plan discharge home today on oral cefdinir as recommended by medicine team.  I  Encouraged him  To continue working daily on pulmonary hygiene and IS after discharge. Discussed smoking cessation and he said he felt motivated and committed to not resume smoking or drinking after discharge.  -Frequent loose stools- C.Difftoxin and antigen negative, says loose stools have resolved.  -Discharge today if internal medicine team agrees. Appreciate assistance from IM.    LOS: 8 days    Antony Odea, [place] [phone] 12/04/2018

## 2018-12-04 NOTE — Plan of Care (Signed)
  Problem: Education: Goal: Knowledge of General Education information will improve Description: Including pain rating scale, medication(s)/side effects and non-pharmacologic comfort measures 12/04/2018 1254 by Malachy Chamber, RN Outcome: Adequate for Discharge 12/04/2018 1254 by Malachy Chamber, RN Outcome: Progressing   Problem: Health Behavior/Discharge Planning: Goal: Ability to manage health-related needs will improve 12/04/2018 1254 by Malachy Chamber, RN Outcome: Adequate for Discharge 12/04/2018 1254 by Malachy Chamber, RN Outcome: Progressing   Problem: Clinical Measurements: Goal: Ability to maintain clinical measurements within normal limits will improve 12/04/2018 1254 by Malachy Chamber, RN Outcome: Adequate for Discharge 12/04/2018 1254 by Malachy Chamber, RN Outcome: Progressing Goal: Will remain free from infection 12/04/2018 1254 by Malachy Chamber, RN Outcome: Adequate for Discharge 12/04/2018 1254 by Malachy Chamber, RN Outcome: Progressing Goal: Diagnostic test results will improve 12/04/2018 1254 by Malachy Chamber, RN Outcome: Adequate for Discharge 12/04/2018 1254 by Malachy Chamber, RN Outcome: Progressing Goal: Respiratory complications will improve 12/04/2018 1254 by Malachy Chamber, RN Outcome: Adequate for Discharge 12/04/2018 1254 by Malachy Chamber, RN Outcome: Progressing Goal: Cardiovascular complication will be avoided 12/04/2018 1254 by Malachy Chamber, RN Outcome: Adequate for Discharge 12/04/2018 1254 by Malachy Chamber, RN Outcome: Progressing   Problem: Nutrition: Goal: Adequate nutrition will be maintained 12/04/2018 1254 by Malachy Chamber, RN Outcome: Adequate for Discharge 12/04/2018 1254 by Malachy Chamber, RN Outcome: Progressing   Problem: Coping: Goal: Level of anxiety will decrease 12/04/2018 1254 by Malachy Chamber, RN Outcome: Adequate for  Discharge 12/04/2018 1254 by Malachy Chamber, RN Outcome: Progressing   Problem: Elimination: Goal: Will not experience complications related to bowel motility 12/04/2018 1254 by Malachy Chamber, RN Outcome: Adequate for Discharge 12/04/2018 1254 by Malachy Chamber, RN Outcome: Progressing Goal: Will not experience complications related to urinary retention 12/04/2018 1254 by Malachy Chamber, RN Outcome: Adequate for Discharge 12/04/2018 1254 by Malachy Chamber, RN Outcome: Progressing   Problem: Safety: Goal: Ability to remain free from injury will improve 12/04/2018 1254 by Malachy Chamber, RN Outcome: Adequate for Discharge 12/04/2018 1254 by Malachy Chamber, RN Outcome: Progressing   Problem: Skin Integrity: Goal: Risk for impaired skin integrity will decrease 12/04/2018 1254 by Malachy Chamber, RN Outcome: Adequate for Discharge 12/04/2018 1254 by Malachy Chamber, RN Outcome: Progressing   Problem: Education: Goal: Knowledge of disease or condition will improve 12/04/2018 1254 by Malachy Chamber, RN Outcome: Adequate for Discharge 12/04/2018 1254 by Malachy Chamber, RN Outcome: Progressing Goal: Knowledge of the prescribed therapeutic regimen will improve 12/04/2018 1254 by Malachy Chamber, RN Outcome: Adequate for Discharge 12/04/2018 1254 by Malachy Chamber, RN Outcome: Progressing

## 2018-12-05 LAB — ANAEROBIC CULTURE

## 2018-12-06 ENCOUNTER — Other Ambulatory Visit (INDEPENDENT_AMBULATORY_CARE_PROVIDER_SITE_OTHER): Payer: Self-pay | Admitting: Orthopaedic Surgery

## 2018-12-06 ENCOUNTER — Telehealth: Payer: Self-pay

## 2018-12-06 NOTE — Telephone Encounter (Signed)
These look like they are from another doc?

## 2018-12-06 NOTE — Telephone Encounter (Signed)
Patient's family member, Ms. Ronnald Ramp contacted the office requesting a medication for patient due to diarrhea.  She stated that he is taking an antibiotic that is causing him diarrhea.  I advised that this is normal and he could take an over-the-counter medication for the diarrhea.  She acknowledged receipt.

## 2018-12-06 NOTE — Telephone Encounter (Signed)
The original prescription was discontinued on 08/12/2018 by Carylon Perches, CMA. Renewing this prescription may not be appropriate.

## 2018-12-06 NOTE — Telephone Encounter (Signed)
Ok, I will not refill.  He will need to contact pcp

## 2018-12-09 ENCOUNTER — Telehealth: Payer: Self-pay | Admitting: Thoracic Surgery (Cardiothoracic Vascular Surgery)

## 2018-12-09 NOTE — Telephone Encounter (Signed)
Ms. Ronnald Ramp called on Mr. [name] behalf  C/o poor appetite, incisional pain, yellow fluid draining from CT sites, redness around sutures and a "lump" behind incision near shoulder blade. No fevers or chills  Had VATS for empyema on 6/20. Went home on 6/28- 5 days ago  Based on description it sounds like redness is due to local irritation from sutures- small area around each incision with no erythema between. Fluid description c/w serous drainage which is not unusual after a tube has been in place for a week. "Lump" at incision likely a seroma.  May use acetaminophen or ibuprofen as needed for pain. I am not comfortable refilling narcotics this early after dc without an assessment. He should not have already used them all. If his pain is too severe to control with NSAIDs should go to ED for evaluation  Follow up in office Monday  If condition worsens or does not improve as expected instructed to call back or go to ED  Remo Lipps C. Roxan Hockey, MD [facility] [phone]

## 2018-12-13 ENCOUNTER — Encounter: Payer: Medicaid Other | Admitting: Internal Medicine

## 2018-12-13 ENCOUNTER — Ambulatory Visit (INDEPENDENT_AMBULATORY_CARE_PROVIDER_SITE_OTHER): Payer: Self-pay | Admitting: Physician Assistant

## 2018-12-13 ENCOUNTER — Other Ambulatory Visit: Payer: Self-pay

## 2018-12-13 VITALS — BP 127/82 | HR 81 | Temp 98.3°F | Resp 16 | Ht 68.0 in | Wt 143.2 lb

## 2018-12-13 DIAGNOSIS — Z09 Encounter for follow-up examination after completed treatment for conditions other than malignant neoplasm: Secondary | ICD-10-CM

## 2018-12-13 DIAGNOSIS — J869 Pyothorax without fistula: Secondary | ICD-10-CM

## 2018-12-13 MED ORDER — OXYCODONE-ACETAMINOPHEN 5-325 MG PO TABS: 1.0000 | ORAL_TABLET | Freq: Four times a day (QID) | ORAL | 0 refills | Status: DC | PRN

## 2018-12-13 NOTE — Progress Notes (Signed)
HPI:  Patient presents to office today for a wound check.  His wife contacted the office over the weekend stating he had yellow drainage coming from his incision sites.  Patient is alone today he denies fever, when asked about drainage he stated he did not know his wife called.    Current Outpatient Medications  Medication Sig Dispense Refill  . ferrous [id number] C-folic acid (TRINSICON / FOLTRIN) capsule Take 1 capsule by mouth 3 (three) times daily after meals for 30 days. 90 capsule 0  . metoprolol tartrate (LOPRESSOR) 25 MG tablet Take 1 tablet (25 mg total) by mouth 2 (two) times daily. 60 tablet 2  . nicotine (NICODERM CQ - DOSED IN MG/24 HOURS) 21 mg/24hr patch Place 1 patch (21 mg total) onto the skin daily for 14 days. (Patient not taking: Reported on 12/13/2018) 14 patch 0  . oxyCODONE-acetaminophen (PERCOCET/ROXICET) 5-325 MG tablet Take 1 tablet by mouth every 6 (six) hours as needed for severe pain. 20 tablet 0   No current facility-administered medications for this visit.     Physical Exam:  BP 127/82 (BP Location: Right Arm, Patient Position: Sitting, Cuff Size: Normal)   Pulse 81   Temp 98.3 F (36.8 C) (Oral)   Resp 16   Ht 5\' 8"  (1.727 m)   Wt 143 lb 3.2 oz (65 kg)   SpO2 94% Comment: RA  BMI 21.77 kg/m   Gen: no apparent distress Heart: RRR Lungs: CTA bilaterally Incisions: well healed, no drainage or erythema present, there was multiple chest tube sutures in place with eschar present  Diagnostic Tests:  None obtained  A/P:  1. Right VATS incisions are healing without evidence of any drainage or erythema 2. Removed chest tube sutures that had scab present, which is likely what patient's wife was talking about 3. RTC as scheduled for routine follow up visit.  Ellwood Handler, PA-C [facility] [phone]

## 2018-12-13 NOTE — Patient Instructions (Signed)
Please wash chest tube sites daily with soap and water

## 2018-12-14 ENCOUNTER — Other Ambulatory Visit: Payer: Self-pay | Admitting: Thoracic Surgery (Cardiothoracic Vascular Surgery)

## 2018-12-14 DIAGNOSIS — J869 Pyothorax without fistula: Secondary | ICD-10-CM

## 2018-12-19 ENCOUNTER — Other Ambulatory Visit: Payer: Self-pay

## 2018-12-20 ENCOUNTER — Other Ambulatory Visit: Payer: Self-pay | Admitting: *Deleted

## 2018-12-20 DIAGNOSIS — G8918 Other acute postprocedural pain: Secondary | ICD-10-CM

## 2018-12-20 MED ORDER — TRAMADOL HCL 50 MG PO TABS: 50.0000 mg | ORAL_TABLET | Freq: Four times a day (QID) | ORAL | 0 refills | Status: AC | PRN

## 2018-12-21 ENCOUNTER — Ambulatory Visit: Payer: Medicaid Other

## 2018-12-21 ENCOUNTER — Ambulatory Visit
Admission: RE | Admit: 2018-12-21 | Discharge: 2018-12-21 | Disposition: A | Discharge status: Home / Self Care | Payer: Medicaid Other | Source: Ambulatory Visit | Attending: Thoracic Surgery (Cardiothoracic Vascular Surgery) | Admitting: Thoracic Surgery (Cardiothoracic Vascular Surgery)

## 2018-12-21 DIAGNOSIS — J869 Pyothorax without fistula: Secondary | ICD-10-CM

## 2018-12-26 ENCOUNTER — Ambulatory Visit: Payer: Self-pay

## 2018-12-26 NOTE — Progress Notes (Unsigned)
This encounter was created in error - please disregard.

## 2018-12-27 ENCOUNTER — Ambulatory Visit: Payer: Medicaid Other

## 2019-02-12 ENCOUNTER — Other Ambulatory Visit: Payer: Self-pay | Admitting: Physician Assistant

## 2019-02-19 ENCOUNTER — Emergency Department (HOSPITAL_COMMUNITY): Payer: Medicaid Other | Admitting: Anesthesiology

## 2019-02-19 ENCOUNTER — Emergency Department (HOSPITAL_COMMUNITY)
Admission: EM | Admit: 2019-02-19 | Discharge: 2019-02-19 | Disposition: A | Discharge status: Home / Self Care | Payer: Medicaid Other | Attending: Emergency Medicine | Admitting: Emergency Medicine

## 2019-02-19 ENCOUNTER — Other Ambulatory Visit: Payer: Self-pay

## 2019-02-19 ENCOUNTER — Encounter (HOSPITAL_COMMUNITY): Payer: Self-pay | Admitting: Emergency Medicine

## 2019-02-19 ENCOUNTER — Emergency Department (HOSPITAL_COMMUNITY): Payer: Medicaid Other

## 2019-02-19 ENCOUNTER — Encounter (HOSPITAL_COMMUNITY)
Admission: EM | Disposition: A | Discharge status: Home / Self Care | Payer: Self-pay | Source: Home / Self Care | Attending: Emergency Medicine

## 2019-02-19 DIAGNOSIS — E871 Hypo-osmolality and hyponatremia: Secondary | ICD-10-CM | POA: Diagnosis not present

## 2019-02-19 DIAGNOSIS — B182 Chronic viral hepatitis C: Secondary | ICD-10-CM | POA: Insufficient documentation

## 2019-02-19 DIAGNOSIS — Z20828 Contact with and (suspected) exposure to other viral communicable diseases: Secondary | ICD-10-CM | POA: Insufficient documentation

## 2019-02-19 DIAGNOSIS — X58XXXA Exposure to other specified factors, initial encounter: Secondary | ICD-10-CM | POA: Insufficient documentation

## 2019-02-19 DIAGNOSIS — Z7982 Long term (current) use of aspirin: Secondary | ICD-10-CM | POA: Insufficient documentation

## 2019-02-19 DIAGNOSIS — K259 Gastric ulcer, unspecified as acute or chronic, without hemorrhage or perforation: Secondary | ICD-10-CM | POA: Diagnosis not present

## 2019-02-19 DIAGNOSIS — T18128A Food in esophagus causing other injury, initial encounter: Secondary | ICD-10-CM | POA: Diagnosis not present

## 2019-02-19 DIAGNOSIS — R7309 Other abnormal glucose: Secondary | ICD-10-CM

## 2019-02-19 DIAGNOSIS — Y9389 Activity, other specified: Secondary | ICD-10-CM | POA: Insufficient documentation

## 2019-02-19 DIAGNOSIS — Y999 Unspecified external cause status: Secondary | ICD-10-CM | POA: Insufficient documentation

## 2019-02-19 DIAGNOSIS — K3189 Other diseases of stomach and duodenum: Secondary | ICD-10-CM | POA: Diagnosis not present

## 2019-02-19 DIAGNOSIS — F17210 Nicotine dependence, cigarettes, uncomplicated: Secondary | ICD-10-CM | POA: Diagnosis not present

## 2019-02-19 DIAGNOSIS — K2210 Ulcer of esophagus without bleeding: Secondary | ICD-10-CM | POA: Diagnosis not present

## 2019-02-19 DIAGNOSIS — Z880 Allergy status to penicillin: Secondary | ICD-10-CM | POA: Insufficient documentation

## 2019-02-19 DIAGNOSIS — Y929 Unspecified place or not applicable: Secondary | ICD-10-CM | POA: Insufficient documentation

## 2019-02-19 DIAGNOSIS — Z79899 Other long term (current) drug therapy: Secondary | ICD-10-CM | POA: Diagnosis not present

## 2019-02-19 DIAGNOSIS — J449 Chronic obstructive pulmonary disease, unspecified: Secondary | ICD-10-CM | POA: Insufficient documentation

## 2019-02-19 HISTORY — PX: FOREIGN BODY REMOVAL: SHX962

## 2019-02-19 HISTORY — PX: BIOPSY: SHX5522

## 2019-02-19 HISTORY — PX: ESOPHAGOGASTRODUODENOSCOPY (EGD) WITH PROPOFOL: SHX5813

## 2019-02-19 LAB — CBC WITH DIFFERENTIAL/PLATELET
Abs Immature Granulocytes: 0.07 10*3/uL (ref 0.00–0.07)
Basophils Absolute: 0 10*3/uL (ref 0.0–0.1)
Basophils Relative: 0 %
Eosinophils Absolute: 0 10*3/uL (ref 0.0–0.5)
Eosinophils Relative: 0 %
HCT: 38.8 % — ABNORMAL LOW (ref 39.0–52.0)
Hemoglobin: 11.8 g/dL — ABNORMAL LOW (ref 13.0–17.0)
Immature Granulocytes: 1 %
Lymphocytes Relative: 3 %
Lymphs Abs: 0.5 10*3/uL — ABNORMAL LOW (ref 0.7–4.0)
MCH: 23.7 pg — ABNORMAL LOW (ref 26.0–34.0)
MCHC: 30.4 g/dL (ref 30.0–36.0)
MCV: 77.9 fL — ABNORMAL LOW (ref 80.0–100.0)
Monocytes Absolute: 0.9 10*3/uL (ref 0.1–1.0)
Monocytes Relative: 6 %
Neutro Abs: 12.6 10*3/uL — ABNORMAL HIGH (ref 1.7–7.7)
Neutrophils Relative %: 90 %
Platelets: 449 10*3/uL — ABNORMAL HIGH (ref 150–400)
RBC: 4.98 MIL/uL (ref 4.22–5.81)
RDW: 20.4 % — ABNORMAL HIGH (ref 11.5–15.5)
WBC: 14 10*3/uL — ABNORMAL HIGH (ref 4.0–10.5)
nRBC: 0 % (ref 0.0–0.2)

## 2019-02-19 LAB — BASIC METABOLIC PANEL
Anion gap: 16 — ABNORMAL HIGH (ref 5–15)
BUN: 12 mg/dL (ref 6–20)
CO2: 21 mmol/L — ABNORMAL LOW (ref 22–32)
Calcium: 9.6 mg/dL (ref 8.9–10.3)
Chloride: 94 mmol/L — ABNORMAL LOW (ref 98–111)
Creatinine, Ser: 0.68 mg/dL (ref 0.61–1.24)
GFR calc Af Amer: >60 mL/min (ref >60)
GFR calc non Af Amer: >60 mL/min (ref >60)
Glucose, Bld: 120 mg/dL — ABNORMAL HIGH (ref 70–99)
Potassium: 4.5 mmol/L (ref 3.5–5.1)
Sodium: 131 mmol/L — ABNORMAL LOW (ref 135–145)

## 2019-02-19 LAB — SARS CORONAVIRUS 2 BY RT PCR (HOSPITAL ORDER, PERFORMED IN ~~LOC~~ HOSPITAL LAB): SARS Coronavirus 2: NEGATIVE

## 2019-02-19 SURGERY — ESOPHAGOGASTRODUODENOSCOPY (EGD) WITH PROPOFOL
Anesthesia: General

## 2019-02-19 MED ORDER — LORAZEPAM 1 MG PO TABS: 0.0000 mg | ORAL_TABLET | Freq: Two times a day (BID) | ORAL | Status: DC

## 2019-02-19 MED ORDER — FENTANYL CITRATE (PF) 100 MCG/2ML IJ SOLN
INTRAMUSCULAR | Status: DC | PRN
  Administered 2019-02-19: 50 ug via INTRAVENOUS

## 2019-02-19 MED ORDER — SODIUM CHLORIDE 0.9 % IV BOLUS
1000.0000 mL | Freq: Once | INTRAVENOUS | Status: AC
  Administered 2019-02-19: 15:00:00 1000 mL via INTRAVENOUS

## 2019-02-19 MED ORDER — LIDOCAINE 2% (20 MG/ML) 5 ML SYRINGE
INTRAMUSCULAR | Status: DC | PRN
  Administered 2019-02-19: 60 mg via INTRAVENOUS

## 2019-02-19 MED ORDER — SODIUM CHLORIDE 0.9 % IV SOLN: INTRAVENOUS | Status: DC

## 2019-02-19 MED ORDER — PROPOFOL 10 MG/ML IV BOLUS
INTRAVENOUS | Status: DC | PRN
  Administered 2019-02-19: 150 mg via INTRAVENOUS

## 2019-02-19 MED ORDER — LORAZEPAM 2 MG/ML IJ SOLN: 0.0000 mg | Freq: Two times a day (BID) | INTRAMUSCULAR | Status: DC

## 2019-02-19 MED ORDER — SUCCINYLCHOLINE CHLORIDE 20 MG/ML IJ SOLN
INTRAMUSCULAR | Status: DC | PRN
  Administered 2019-02-19: 70 mg via INTRAVENOUS

## 2019-02-19 MED ORDER — PANTOPRAZOLE SODIUM 40 MG PO TBEC: 40.0000 mg | DELAYED_RELEASE_TABLET | Freq: Two times a day (BID) | ORAL | 3 refills | Status: DC

## 2019-02-19 MED ORDER — ONDANSETRON HCL 4 MG/2ML IJ SOLN
INTRAMUSCULAR | Status: DC | PRN
  Administered 2019-02-19: 4 mg via INTRAVENOUS

## 2019-02-19 MED ORDER — MORPHINE SULFATE (PF) 4 MG/ML IV SOLN
4.0000 mg | Freq: Once | INTRAVENOUS | Status: AC
  Administered 2019-02-19: 14:00:00 4 mg via INTRAVENOUS
  Filled 2019-02-19: qty 1

## 2019-02-19 MED ORDER — MIDAZOLAM HCL 5 MG/5ML IJ SOLN
INTRAMUSCULAR | Status: DC | PRN
  Administered 2019-02-19: 2 mg via INTRAVENOUS

## 2019-02-19 MED ORDER — SUCRALFATE 1 G PO TABS: 1.0000 g | ORAL_TABLET | Freq: Two times a day (BID) | ORAL | 1 refills | Status: DC

## 2019-02-19 MED ORDER — MIDAZOLAM HCL 2 MG/2ML IJ SOLN
INTRAMUSCULAR | Status: AC
  Filled 2019-02-19: qty 2

## 2019-02-19 MED ORDER — LORAZEPAM 2 MG/ML IJ SOLN
0.0000 mg | Freq: Four times a day (QID) | INTRAMUSCULAR | Status: DC
  Administered 2019-02-19: 16:00:00 1 mg via INTRAVENOUS
  Filled 2019-02-19: qty 1

## 2019-02-19 MED ORDER — LORAZEPAM 1 MG PO TABS: 0.0000 mg | ORAL_TABLET | Freq: Four times a day (QID) | ORAL | Status: DC

## 2019-02-19 MED ORDER — LACTATED RINGERS IV SOLN
INTRAVENOUS | Status: DC
  Administered 2019-02-19: 18:00:00 via INTRAVENOUS

## 2019-02-19 MED ORDER — FENTANYL CITRATE (PF) 250 MCG/5ML IJ SOLN
INTRAMUSCULAR | Status: AC
  Filled 2019-02-19: qty 5

## 2019-02-19 MED ORDER — THIAMINE HCL 100 MG/ML IJ SOLN
100.0000 mg | Freq: Every day | INTRAMUSCULAR | Status: DC
  Administered 2019-02-19: 16:00:00 100 mg via INTRAVENOUS
  Filled 2019-02-19: qty 2

## 2019-02-19 MED ORDER — VITAMIN B-1 100 MG PO TABS: 100.0000 mg | ORAL_TABLET | Freq: Every day | ORAL | Status: DC

## 2019-02-19 MED ORDER — HYDROMORPHONE HCL 1 MG/ML IJ SOLN
0.5000 mg | Freq: Once | INTRAMUSCULAR | Status: AC
  Administered 2019-02-19: 16:00:00 0.5 mg via INTRAVENOUS
  Filled 2019-02-19: qty 1

## 2019-02-19 MED ORDER — GLUCAGON HCL RDNA (DIAGNOSTIC) 1 MG IJ SOLR
1.0000 mg | Freq: Once | INTRAMUSCULAR | Status: AC
  Administered 2019-02-19: 1 mg via INTRAVENOUS
  Filled 2019-02-19: qty 1

## 2019-02-19 SURGICAL SUPPLY — 14 items

## 2019-02-19 NOTE — ED Provider Notes (Signed)
[facility] EMERGENCY DEPARTMENT Provider Note   CSN: [id number] Arrival date & time: 02/19/19  1313   History   Chief Complaint Chief Complaint  Patient presents with  . food impaction   HPI [name] is a 58 y.o. male with past medical history significant for alcohol abuse, chronic back pain, COPD, hepatitis C, empyema right lung, chronic pancreatitis, tobacco abuse who presents for evaluation of food impaction.  Patient states approximately 3 PM yesterday he was eating a pork chop and felt as if a piece got stuck in his throat.  Says he has tried to make himself throw up however has not been able to throw up any food.  He does drink alcohol, 12 pack daily and has been unable to tolerate any liquids secondary to pain.  Denies prior history of DTs or withdrawal seizures.  Denies any anticoagulation use.  Last p.o. intake at 3 PM yesterday.  States he does have a nagging sensation to his lower esophagus as well as a severe burning sensation in his throat.  Denies prior history of esophageal varices.  He is able to tolerate his oral secretions however states occasionally he feels like he does have to spit this out.  Denies headache, dizziness, chest pain, shortness of breath, dysphasia, hemoptysis, abdominal pain, diarrhea, dysuria.  He has not taken anything for his symptoms.  No prior history of esophageal strictures.  Denies additional aggravating or alleviating factors.  History obtained from patient and past medical records.  No interpreter is used.  No GI provider.     HPI  Past Medical History:  Diagnosis Date  . Alcohol abuse   . Alcoholic ([place])   . Back pain   . CAP (community acquired pneumonia) 11/26/2018  . COPD (chronic obstructive pulmonary disease) ([place])   . Empyema of right pleural space ([place]) 11/26/2018  . Heavy smoker   . Hep C w/ coma, chronic ([place])   . Loose, teeth   . Pancreatitis   . Poor dentition   . Tobacco abuse     Patient Active  Problem List   Diagnosis Date Noted  . CAP (community acquired pneumonia) 11/26/2018  . Empyema of right pleural space ([place]) 11/26/2018  . Empyema, right ([place]) 11/26/2018  . Alcohol abuse   . Tobacco abuse   . Poor dentition   . Radiculopathy due to lumbar intervertebral disc disorder 12/29/2017  . Chronic left-sided low back pain with left-sided sciatica 11/30/2017  . 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 ARTERY REPAIR  2013   neck  . KNEE SURGERY  2013   rt  . MICROLARYNGOSCOPY N/A 07/31/2014   Procedure: MICROLARYNGOSCOPY WITH BIOPSY ;  Surgeon: Rozetta Nunnery, MD;  Location: [facility];  Service: ENT;  Laterality: N/A;  . NECK SURGERY  2013   cervical fusion-  . VIDEO ASSISTED THORACOSCOPY (VATS)/EMPYEMA Right 11/26/2018   Procedure: VIDEO ASSISTED THORACOSCOPY (VATS)/EMPYEMA;  Surgeon: Rexene Alberts, MD;  Location: [facility];  Service: Thoracic;  Laterality: Right;  [name] VIDEO BRONCHOSCOPY  11/26/2018   Procedure: Video Bronchoscopy;  Surgeon: Rexene Alberts, MD;  Location: [facility];  Service: Thoracic;;      Home Medications    Prior to Admission medications   Medication Sig Start Date End Date Taking? Authorizing Provider  aspirin-acetaminophen-caffeine (EXCEDRIN MIGRAINE) [phone] MG tablet Take 1 tablet by mouth every 6 (six) hours as needed (pain).  Yes [provider]  ibuprofen (ADVIL) 200 MG tablet Take 400 mg by mouth every 6 (six) hours as needed for moderate pain.   Yes [provider]  metoprolol tartrate (LOPRESSOR) 25 MG tablet Take 1 tablet (25 mg total) by mouth 2 (two) times daily. 12/04/18  Yes Roddenberry, Arlis Porta, PA-C  oxyCODONE-acetaminophen (PERCOCET/ROXICET) 5-325 MG tablet Take 1 tablet by mouth every 6 (six) hours as needed for severe pain. 12/13/18  Yes Barrett, Erin R, PA-C  predniSONE (STERAPRED UNI-PAK 48 TAB) 10  MG (48) TBPK tablet Take 10 mg by mouth as directed. Started on 02-08-19  ds30 02/08/19  Yes [provider]  traMADol (ULTRAM) 50 MG tablet Take 50 mg by mouth every 6 (six) hours as needed for moderate pain.   Yes [provider]    Family History No family history on file.  Social History Social History   Tobacco Use  . Smoking status: Current Every Day Smoker    Packs/day: 2.00    Types: Cigarettes  . Smokeless tobacco: Never Used  Substance Use Topics  . Alcohol use: Yes    Comment: alcoholic (4 [id number] a day). 12 pck a day  . Drug use: No     Allergies   Penicillins   Review of Systems Review of Systems  Constitutional: Negative.   HENT: Positive for sore throat and trouble swallowing (Liquid and solid food.). Negative for congestion, drooling, ear discharge, facial swelling, postnasal drip, rhinorrhea, sinus pressure, sinus pain and sneezing.   Respiratory: Negative.   Cardiovascular: Negative.   Gastrointestinal: Negative.   Genitourinary: Negative.   Musculoskeletal: Negative.   Skin: Negative.   Neurological: Negative.   All other systems reviewed and are negative.    Physical Exam Updated Vital Signs BP (!) 161/75   Pulse 81   Temp 98.4 F (36.9 C) (Oral)   Resp 20   Ht 5\' 8"  (1.727 m)   Wt 68 kg   SpO2 98%   BMI 22.81 kg/m   Physical Exam Vitals signs and nursing note reviewed.  Constitutional:      General: He is not in acute distress.    Appearance: He is well-developed. He is not ill-appearing, toxic-appearing or diaphoretic.  HENT:     Head: Normocephalic and atraumatic.     Jaw: There is normal jaw occlusion.     Nose: Nose normal.     Mouth/Throat:     Lips: Pink.     Mouth: Mucous membranes are moist.     Pharynx: Uvula midline.     Tonsils: No tonsillar exudate or tonsillar abscesses.     Comments: No drooling, dysphasia or trismus.  No pooling of oral secretions.  Mildly erythematous posterior oropharynx. Eyes:      Pupils: Pupils are equal, round, and reactive to light.  Neck:     Musculoskeletal: Full passive range of motion without pain, normal range of motion and neck supple.     Trachea: Trachea and phonation normal.     Comments: No neck stiffness or neck rigidity.  Phonation normal. Cardiovascular:     Rate and Rhythm: Normal rate and regular rhythm.     Pulses: Normal pulses.     Heart sounds: Normal heart sounds.  Pulmonary:     Effort: Pulmonary effort is normal. No respiratory distress.  Chest:     Comments: Clear to auscultation bilaterally without wheeze, rhonchi or rales. Abdominal:     General: There is no distension.  Palpations: Abdomen is soft.     Comments: Soft, Nontender without rebound or guarding.  Normoactive bowel sounds.  No overlying skin changes to abdominal wall.  Musculoskeletal: Normal range of motion.     Comments: Moves all 4 extremities without difficulty.  Skin:    General: Skin is warm and dry.     Comments: No edema, erythema, ecchymosis or warmth.  Brisk capillary refill.  Neurological:     Mental Status: He is alert.    ED Treatments / Results  Labs (all labs ordered are listed, but only abnormal results are displayed) Labs Reviewed  CBC WITH DIFFERENTIAL/PLATELET - Abnormal; Notable for the following components:      Result Value   WBC 14.0 (*)    Hemoglobin 11.8 (*)    HCT 38.8 (*)    MCV 77.9 (*)    MCH 23.7 (*)    RDW 20.4 (*)    Platelets 449 (*)    Neutro Abs 12.6 (*)    Lymphs Abs 0.5 (*)    All other components within normal limits  BASIC METABOLIC PANEL - Abnormal; Notable for the following components:   Sodium 131 (*)    Chloride 94 (*)    CO2 21 (*)    Glucose, Bld 120 (*)    Anion gap 16 (*)    All other components within normal limits  SARS CORONAVIRUS 2 (HOSPITAL ORDER, [facility])   EKG EKG Interpretation  Date/Time:  Sunday February 19 2019 15:26:58 EDT Ventricular Rate:  81 PR Interval:     QRS Duration: 96 QT Interval:  416 QTC Calculation: 483 R Axis:   125 Text Interpretation:  Sinus rhythm Right axis deviation Low voltage, extremity leads Nonspecific T abnormalities, lateral leads Borderline prolonged QT interval No significant change since last tracing Confirmed by Blanchie Dessert [phone]) on 02/19/2019 3:32:40 PM   Radiology Dg Chest 2 View  Result Date: 02/19/2019 CLINICAL DATA:  Possible food stuck in esophagus from last night. ETOH abuse. EXAM: CHEST - 2 VIEW COMPARISON:  12/21/2018 FINDINGS: Lungs are adequately inflated without focal airspace consolidation or effusion. Cardiomediastinal silhouette and remainder the exam is unchanged. IMPRESSION: No active cardiopulmonary disease. Electronically Signed   By: Marin Olp M.D.   On: 02/19/2019 14:33   Procedures Procedures (including critical care time)  Medications Ordered in ED Medications  LORazepam (ATIVAN) injection 0-4 mg (1 mg Intravenous Given 02/19/19 1617)    Or  LORazepam (ATIVAN) tablet 0-4 mg ( Oral See Alternative 02/19/19 1617)  LORazepam (ATIVAN) injection 0-4 mg (has no administration in time range)    Or  LORazepam (ATIVAN) tablet 0-4 mg (has no administration in time range)  thiamine (VITAMIN B-1) tablet 100 mg ( Oral See Alternative 02/19/19 1618)    Or  thiamine (B-1) injection 100 mg (100 mg Intravenous Given 02/19/19 1618)  glucagon (human recombinant) (GLUCAGEN) injection 1 mg (1 mg Intravenous Given 02/19/19 1424)  morphine 4 MG/ML injection 4 mg (4 mg Intravenous Given 02/19/19 1424)  sodium chloride 0.9 % bolus 1,000 mL (0 mLs Intravenous Stopped 02/19/19 1530)  HYDROmorphone (DILAUDID) injection 0.5 mg (0.5 mg Intravenous Given 02/19/19 1551)   Initial Impression / Assessment and Plan / ED Course  I have reviewed the triage vital signs and the nursing notes.  Pertinent labs & imaging results that were available during my care of the patient were reviewed by me and considered in my  medical decision making (see chart for details).  58 year old male history of chronic alcoholism, hepatitis C appears otherwise well presents for evaluation of food bolus. Eating pork chops yesterday around 3 PM and has felt sensation of food in his throat. No drooling, dysphasia or trismus however has been unable to drink or eat since the incident.  He does drink approximately 12 beers daily.  Denies history of DTs or withdrawal seizures.  He does not appear to be actively withdrawing at this time.  Heart and lungs clear.  Phonation normal.  Posterior oropharynx mildly erythematous without any obvious swelling or impaction.  Will get basic labs, x-ray, trial glucagon.  If unsuccessful need to call GI. Possible food impaction.  Labs and imaging personally reviewed: DG chest without acute findings. Mild hyponatremia at 134, elevated glucose at 120, gap 16.  Likely related to alcohol use versus dehydration.  No infectious symptoms on exam. CBC with mild leukocytosis at 14.0, hemoglobin 11.8  No emesis after glucagon.  Will consult with GI for possible scope.  1500: Consulted with Dr. Therisa Doyne, gastroenterology. Will plan to perform EGD. No evidence of alcohol withdrawal at this time. VS stable. Getting IVF for elevated anion gap.    Care transferred to Law who will follow up after EGD. She will determine ultimate treatment, plan and disposition.    Final Clinical Impressions(s) / ED Diagnoses   Final diagnoses:  Food impaction of esophagus, initial encounter  Hyponatremia  Elevated glucose    ED Discharge Orders    None       ,  A, PA-C 02/19/19 1620    Isla Pence, MD 02/22/19 [phone]

## 2019-02-19 NOTE — ED Notes (Signed)
Pt transported to Endo 

## 2019-02-19 NOTE — H&P (Signed)
[name] is an 58 y.o. male.   Chief Complaint: Esophageal food bolus impaction HPI: 59 year old male states he was in his usual state of health until 3 PM yesterday when he was eating pork chop and felt a piece lodged in his upper throat.  Since then he has not been able to swallow any solids or liquids without regurgitating them.  He denies shortness of breath and has been able to maintain his secretions saliva.  However patient has not been able to eat or drink anything since 3 PM yesterday.  He continues to feel a tightness and pressure in his upper throat.  Prior to this patient has never had any difficulty swallowing or pain on swallowing.  He denies history of acid reflux or heartburn.  He has never had an endoscopy or a colonoscopy in the past.  Denies black stools or blood in stool. Patient drinks 12 cans of beer on a daily basis and has been doing so for the last 20 years.  He also smokes almost 2 packs of cigarettes daily for over 20 years. Patient is on prednisone for right leg pain and also takes Excedrin for migraine headaches as needed along with ibuprofen as needed for moderate pain.   Past Medical History:  Diagnosis Date  . Alcohol abuse   . Alcoholic ([place])   . Back pain   . CAP (community acquired pneumonia) 11/26/2018  . COPD (chronic obstructive pulmonary disease) ([place])   . Empyema of right pleural space ([place]) 11/26/2018  . Heavy smoker   . Hep C w/ coma, chronic ([place])   . Loose, teeth   . Pancreatitis   . Poor dentition   . Tobacco abuse     Past Surgical History:  Procedure Laterality Date  . ARTERY REPAIR  2013   neck  . KNEE SURGERY  2013   rt  . MICROLARYNGOSCOPY N/A 07/31/2014   Procedure: MICROLARYNGOSCOPY WITH BIOPSY ;  Surgeon: Rozetta Nunnery, MD;  Location: [facility];  Service: ENT;  Laterality: N/A;  . NECK SURGERY  2013   cervical fusion-  . VIDEO ASSISTED THORACOSCOPY (VATS)/EMPYEMA Right 11/26/2018   Procedure: VIDEO ASSISTED  THORACOSCOPY (VATS)/EMPYEMA;  Surgeon: Rexene Alberts, MD;  Location: [facility];  Service: Thoracic;  Laterality: Right;  [name] VIDEO BRONCHOSCOPY  11/26/2018   Procedure: Video Bronchoscopy;  Surgeon: Rexene Alberts, MD;  Location: [facility];  Service: Thoracic;;    History reviewed. No pertinent family history. Social History:  reports that he has been smoking cigarettes. He has been smoking about 2.00 packs per day. He has never used smokeless tobacco. He reports current alcohol use. He reports that he does not use drugs.  Allergies:  Allergies  Allergen Reactions  . Penicillins Other (See Comments)    Tolerated Zosyn 11/2018 Has patient had a PCN reaction causing immediate rash, facial/tongue/throat swelling, SOB or lightheadedness with hypotension: childhood allergy Has patient had a PCN reaction causing severe rash involving mucus membranes or skin necrosis: childhood allergy Has patient had a PCN reaction that required hospitalization childhood allergy Has patient had a PCN reaction occurring within the last 10 years: childhood allergy If all of the above answers are "NO", then may proceed wit    Medications Prior to Admission  Medication Sig Dispense Refill  . aspirin-acetaminophen-caffeine (EXCEDRIN MIGRAINE) 250-250-65 MG tablet Take 1 tablet by mouth every 6 (six) hours as needed (pain).    [name] ibuprofen (ADVIL) 200 MG tablet Take 400 mg by mouth  every 6 (six) hours as needed for moderate pain.    . metoprolol tartrate (LOPRESSOR) 25 MG tablet Take 1 tablet (25 mg total) by mouth 2 (two) times daily. 60 tablet 2  . oxyCODONE-acetaminophen (PERCOCET/ROXICET) 5-325 MG tablet Take 1 tablet by mouth every 6 (six) hours as needed for severe pain. 20 tablet 0  . predniSONE (STERAPRED UNI-PAK 48 TAB) 10 MG (48) TBPK tablet Take 10 mg by mouth as directed. Started on 02-08-19  ds30    . traMADol (ULTRAM) 50 MG tablet Take 50 mg by mouth every 6 (six) hours as needed for moderate pain.       Results for orders placed or performed during the hospital encounter of 02/19/19 (from the past 48 hour(s))  CBC with Differential     Status: Abnormal   Collection Time: 02/19/19  1:40 PM  Result Value Ref Range   WBC 14.0 (H) 4.0 - 10.5 K/uL   RBC 4.98 4.22 - 5.81 MIL/uL   Hemoglobin 11.8 (L) 13.0 - 17.0 g/dL   HCT 38.8 (L) 39.0 - 52.0 %   MCV 77.9 (L) 80.0 - 100.0 fL   MCH 23.7 (L) 26.0 - 34.0 pg   MCHC 30.4 30.0 - 36.0 g/dL   RDW 20.4 (H) 11.5 - 15.5 %   Platelets 449 (H) 150 - 400 K/uL   nRBC 0.0 0.0 - 0.2 %   Neutrophils Relative % 90 %   Neutro Abs 12.6 (H) 1.7 - 7.7 K/uL   Lymphocytes Relative 3 %   Lymphs Abs 0.5 (L) 0.7 - 4.0 K/uL   Monocytes Relative 6 %   Monocytes Absolute 0.9 0.1 - 1.0 K/uL   Eosinophils Relative 0 %   Eosinophils Absolute 0.0 0.0 - 0.5 K/uL   Basophils Relative 0 %   Basophils Absolute 0.0 0.0 - 0.1 K/uL   Immature Granulocytes 1 %   Abs Immature Granulocytes 0.07 0.00 - 0.07 K/uL    Comment: Performed at [facility], [address], [place], [place] [id number]  Basic metabolic panel     Status: Abnormal   Collection Time: 02/19/19  1:40 PM  Result Value Ref Range   Sodium 131 (L) 135 - 145 mmol/L   Potassium 4.5 3.5 - 5.1 mmol/L   Chloride 94 (L) 98 - 111 mmol/L   CO2 21 (L) 22 - 32 mmol/L   Glucose, Bld 120 (H) 70 - 99 mg/dL   BUN 12 6 - 20 mg/dL   Creatinine, Ser 0.68 0.61 - 1.24 mg/dL   Calcium 9.6 8.9 - 10.3 mg/dL   GFR calc non Af Amer >60 >60 mL/min   GFR calc Af Amer >60 >60 mL/min   Anion gap 16 (H) 5 - 15    Comment: Performed at [facility], [place] [address] [place], [place] [address]  SARS Coronavirus 2 [facility] order, Performed in [facility] hospital lab) Nasopharyngeal Nasopharyngeal Swab     Status: None   Collection Time: 02/19/19  3:10 PM   Specimen: Nasopharyngeal Swab  Result Value Ref Range   SARS Coronavirus 2 NEGATIVE NEGATIVE    Comment: (NOTE) If result is NEGATIVE SARS-CoV-2 target nucleic  acids are NOT DETECTED. The SARS-CoV-2 RNA is generally detectable in upper and lower  respiratory specimens during the acute phase of infection. The lowest  concentration of SARS-CoV-2 viral copies this assay can detect is 250  copies / mL. A negative result does not preclude SARS-CoV-2 infection  and should not be  used as the sole basis for treatment or other  patient management decisions.  A negative result may occur with  improper specimen collection / handling, submission of specimen other  than nasopharyngeal swab, presence of viral mutation(s) within the  areas targeted by this assay, and inadequate number of viral copies  (<250 copies / mL). A negative result must be combined with clinical  observations, patient history, and epidemiological information. If result is POSITIVE SARS-CoV-2 target nucleic acids are DETECTED. The SARS-CoV-2 RNA is generally detectable in upper and lower  respiratory specimens dur ing the acute phase of infection.  Positive  results are indicative of active infection with SARS-CoV-2.  Clinical  correlation with patient history and other diagnostic information is  necessary to determine patient infection status.  Positive results do  not rule out bacterial infection or co-infection with other viruses. If result is PRESUMPTIVE POSTIVE SARS-CoV-2 nucleic acids MAY BE PRESENT.   A presumptive positive result was obtained on the submitted specimen  and confirmed on repeat testing.  While 2019 novel coronavirus  (SARS-CoV-2) nucleic acids may be present in the submitted sample  additional confirmatory testing may be necessary for epidemiological  and / or clinical management purposes  to differentiate between  SARS-CoV-2 and other Sarbecovirus currently known to infect humans.  If clinically indicated additional testing with an alternate test  methodology [phone]) is advised. The SARS-CoV-2 RNA is generally  detectable in upper and lower respiratory  sp ecimens during the acute  phase of infection. The expected result is Negative. Fact Sheet for Patients:  StrictlyIdeas.no Fact Sheet for Healthcare Providers: BankingDealers.co.za This test is not yet approved or cleared by the [place] FDA and has been authorized for detection and/or diagnosis of SARS-CoV-2 by FDA under an Emergency Use Authorization (EUA).  This EUA will remain in effect (meaning this test can be used) for the duration of the COVID-19 declaration under Section 564(b)(1) of the Act, 21 U.S.C. section 360bbb-3(b)(1), unless the authorization is terminated or revoked sooner. Performed at [facility], [place] [address] [place], [place] [address]    Dg Chest 2 View  Result Date: 02/19/2019 CLINICAL DATA:  Possible food stuck in esophagus from last night. ETOH abuse. EXAM: CHEST - 2 VIEW COMPARISON:  12/21/2018 FINDINGS: Lungs are adequately inflated without focal airspace consolidation or effusion. Cardiomediastinal silhouette and remainder the exam is unchanged. IMPRESSION: No active cardiopulmonary disease. Electronically Signed   By: Marin Olp M.D.   On: 02/19/2019 14:33    Review of Systems  Constitutional: Negative.   HENT: Negative.   Eyes: Negative.   Respiratory: Negative.   Cardiovascular: Negative.   Gastrointestinal: Positive for heartburn, nausea and vomiting.  Genitourinary: Negative.   Musculoskeletal: Positive for joint pain.  Skin: Negative.   Neurological: Negative.   Endo/Heme/Allergies: Negative.   Psychiatric/Behavioral: Negative.     Negative except for pertinent positive mentioned in HPI  Blood pressure (!) 170/98, pulse 92, temperature 99.1 F (37.3 C), temperature source Temporal, resp. rate (!) 22, height 5\' 8"  (1.727 m), weight 68 kg, SpO2 98 %. Physical Exam  Constitutional: He is oriented to person, place, and time. He appears well-developed and well-nourished.  HENT:   Head: Normocephalic and atraumatic.  Eyes: Conjunctivae are normal.  Neck: Neck supple.  Cardiovascular: Normal rate and regular rhythm.  Respiratory: Effort normal.  GI: He exhibits no distension.  Neurological: He is alert and oriented to person, place, and time.  Skin: Skin is dry.  Psychiatric: He  has a normal mood and affect.     Assessment/Plan Esophageal food bolus impaction. Recommend EGD now. As patient has had impacted food for over 24 hours, there is increased risk of bleeding/perforation with underlying edema/inflammation in the setting of NSAID and steroid use. The risks and the benefits of the procedure were discussed with the patient in details, he verbalizes understanding and consents.  Ronnette Juniper, MD 02/19/2019, 5:36 PM

## 2019-02-19 NOTE — Op Note (Signed)
[facility] Patient Name: [name] Procedure Date : 02/19/2019 MRN: [id number] Attending MD: Ronnette Juniper , MD Date of Birth: 1961/04/27 CSN: [id number] Age: 58 Admit Type: Emergency Department Procedure:                Upper GI endoscopy Indications:              Foreign body in the esophagus, esophageal food                            bolus impaction Providers:                Ronnette Juniper, MD, Ashley Jacobs, RN, Laverda Sorenson,                            Technician, Merrilyn Puma, CRNA Referring MD:              Medicines:                Monitored Anesthesia Care Complications:            No immediate complications. Estimated blood loss:                            Minimal. Estimated Blood Loss:     Estimated blood loss was minimal. Procedure:                Pre-Anesthesia Assessment:                           - Prior to the procedure, a History and Physical                            was performed, and patient medications and                            allergies were reviewed. The patient's tolerance of                            previous anesthesia was also reviewed. The risks                            and benefits of the procedure and the sedation                            options and risks were discussed with the patient.                            All questions were answered, and informed consent                            was obtained. Prior Anticoagulants: The patient has                            taken no previous anticoagulant or antiplatelet  agents. ASA Grade Assessment: III - A patient with                            severe systemic disease. After reviewing the risks                            and benefits, the patient was deemed in                            satisfactory condition to undergo the procedure.                           After obtaining informed consent, the endoscope was                            passed under direct  vision. Throughout the                            procedure, the patient's blood pressure, pulse, and                            oxygen saturations were monitored continuously. The                            [id number]-[id number]) Olympus gastroscope was                            introduced through the mouth, and advanced to the                            second part of duodenum. The upper GI endoscopy was                            accomplished without difficulty. The patient                            tolerated the procedure well. Scope In: Scope Out: Findings:      Food was found in the lower third of the esophagus. The impacted meat       was gently pushed in to the gastric cavity with minimal resistance.      LA Grade C (one or more mucosal breaks continuous between tops of 2 or       more mucosal folds, less than 75% circumference) esophagitis with no       bleeding was found 30 to 40 cm from the incisors. Biopsies were taken       with a cold forceps for histology.      Few superficial esophageal ulcers with no bleeding and no stigmata of       recent bleeding were found 30 to 40 cm from the incisors.      One non-bleeding cratered gastric ulcer with a clean ulcer base (Forrest       Class III) was found in the prepyloric region of the stomach.The pylorus       appeared deformed from scarring and possibly healed ulcers in the past.  The lesion was 10 mm in largest dimension.      Localized moderately erythematous mucosa without bleeding was found in       the gastric antrum. Biopsies were taken with a cold forceps for       Helicobacter pylori testing.      The cardia and gastric fundus were normal on retroflexion.      The examined duodenum was normal. Impression:               - Food in the lower third of the esophagus.                            Successfully pushed in to gastric cavity with                            minimal resistance.                           - LA Grade  C esophagitis. Biopsied.                           - Non-bleeding esophageal ulcers.                           - Non-bleeding gastric ulcer with a clean ulcer                            base (Forrest Class III).                           - Erythematous mucosa in the antrum. Biopsied.                           - Normal examined duodenum. Moderate Sedation:      Patient did not receive moderate sedation for this procedure, but       instead received monitored anesthesia care. Recommendation:           - Resume regular diet.                           - Use Protonix (pantoprazole) 40 mg PO BID for 8                            weeks.                           - Use sucralfate tablets 1 gram PO BID for 2 weeks.                           - Await pathology results.                           - Avoid alcohol, NSAIDs and smoking, if possible                            steroids. Procedure Code(s):        --- Professional ---  [id number], Esophagogastroduodenoscopy, flexible,                            transoral; with biopsy, single or multiple Diagnosis Code(s):        --- Professional ---                           [id number], Food in esophagus causing other injury,                            initial encounter                           K20.9, Esophagitis, unspecified                           K22.10, Ulcer of esophagus without bleeding                           K25.9, Gastric ulcer, unspecified as acute or                            chronic, without hemorrhage or perforation                           K31.89, Other diseases of stomach and duodenum                           T18.108A, Unspecified foreign body in esophagus                            causing other injury, initial encounter CPT copyright 2019 [employer] Association. All rights reserved. The codes documented in this report are preliminary and upon coder review may  be revised to meet current compliance  requirements. Ronnette Juniper, MD 02/19/2019 6:26:28 PM This report has been signed electronically. Number of Addenda: 0

## 2019-02-19 NOTE — ED Provider Notes (Signed)
Sign out from [facility], PA-C at shift change See previous providers note for full H&P  Choked on a pork chop yesterday around 3 PM.  Waiting for gastroenterology for food disimpaction.  Patient is an alcoholic and has not been able to drink.  On CIWA protocol, no evidence of withdrawal right now. Fluids for anion gap.  Physical Exam  BP (!) 161/75   Pulse 81   Temp 98.4 F (36.9 C) (Oral)   Resp 20   Ht 5\' 8"  (1.727 m)   Wt 68 kg   SpO2 98%   BMI 22.81 kg/m   Physical Exam Vitals signs and nursing note reviewed.  Constitutional:      General: He is not in acute distress.    Appearance: He is well-developed. He is not diaphoretic.  HENT:     Head: Normocephalic and atraumatic.     Mouth/Throat:     Pharynx: No oropharyngeal exudate.  Eyes:     General: No scleral icterus.       Right eye: No discharge.        Left eye: No discharge.     Conjunctiva/sclera: Conjunctivae normal.  Neck:     Musculoskeletal: Normal range of motion and neck supple.     Thyroid: No thyromegaly.  Cardiovascular:     Rate and Rhythm: Normal rate and regular rhythm.  Pulmonary:     Effort: Pulmonary effort is normal.  Lymphadenopathy:     Cervical: No cervical adenopathy.  Skin:    General: Skin is warm and dry.     Coloration: Skin is not pale.     Findings: No rash.  Neurological:     Mental Status: He is alert.     Coordination: Coordination normal.     ED Course/Procedures     Procedures  MDM  Patient returns from endoscopy with piece of pork chop pushed of the stomach for digestion.  Biopsies were taken.  Gastroenterology prescribed Carafate and Protonix.  Patient advised to follow-up as recommended by gastroenterology.  Patient has recovered and is feeling back to baseline from general anesthesia.  I called wife with patient's permission and updated her and she is sending a friend to pick the patient up.  Patient discharged in satisfactory condition.        Frederica Kuster, PA-C 02/19/19 2028    Blanchie Dessert, MD 02/19/19 2043

## 2019-02-19 NOTE — Anesthesia Procedure Notes (Signed)
Procedure Name: Intubation Date/Time: 02/19/2019 6:07 PM Performed by: Wilburn Cornelia, CRNA Pre-anesthesia Checklist: Patient identified, Emergency Drugs available, Suction available, Patient being monitored and Timeout performed Patient Re-evaluated:Patient Re-evaluated prior to induction Oxygen Delivery Method: Circle system utilized Preoxygenation: Pre-oxygenation with 100% oxygen Induction Type: IV induction, Rapid sequence and Cricoid Pressure applied Laryngoscope Size: Mac and 3 Grade View: Grade I Tube type: Oral Tube size: 7.5 mm Number of attempts: 1 Airway Equipment and Method: Stylet Placement Confirmation: ETT inserted through vocal cords under direct vision,  positive ETCO2,  breath sounds checked- equal and bilateral and CO2 detector Secured at: 22 cm Tube secured with: Tape Dental Injury: Teeth and Oropharynx as per pre-operative assessment

## 2019-02-19 NOTE — Anesthesia Preprocedure Evaluation (Addendum)
Anesthesia Evaluation  Patient identified by MRN, date of birth, ID band Patient awake    Reviewed: Allergy & Precautions, NPO status , Patient's Chart, lab work & pertinent test results  Airway Mallampati: II       Dental  (+) Poor Dentition, Edentulous Upper   Pulmonary COPD, Current SmokerPatient did not abstain from smoking.,  empyema right lung   Pulmonary exam normal        Cardiovascular Normal cardiovascular exam  ECG: rate 81. Sinus rhythm Right axis deviation   Neuro/Psych PSYCHIATRIC DISORDERS  Neuromuscular disease    GI/Hepatic negative GI ROS, (+)     substance abuse  , Hepatitis -, C  Endo/Other  negative endocrine ROS  Renal/GU negative Renal ROS     Musculoskeletal Back pain   Abdominal   Peds  Hematology  (+) anemia ,   Anesthesia Other Findings food bolus impaction, dysphagia  Reproductive/Obstetrics                            Anesthesia Physical Anesthesia Plan  ASA: III and emergent  Anesthesia Plan: General   Post-op Pain Management:    Induction: Intravenous and Rapid sequence  PONV Risk Score and Plan: 1 and Ondansetron, Dexamethasone, Treatment may vary due to age or medical condition and Midazolam  Airway Management Planned: Oral ETT  Additional Equipment:   Intra-op Plan:   Post-operative Plan: Extubation in OR  Informed Consent: I have reviewed the patients History and Physical, chart, labs and discussed the procedure including the risks, benefits and alternatives for the proposed anesthesia with the patient or authorized representative who has indicated his/her understanding and acceptance.     Dental advisory given  Plan Discussed with: CRNA  Anesthesia Plan Comments:        Anesthesia Quick Evaluation

## 2019-02-19 NOTE — Discharge Instructions (Addendum)
You should be getting a call from [facility] gastroenterology regarding your biopsy results.  Your medications, Protonix and Carafate, were sent to the [facility] on [address].  Begin taking these as prescribed.  Please return to the emergency department if you develop any new or worsening symptoms.

## 2019-02-19 NOTE — Brief Op Note (Signed)
02/19/2019  6:26 PM  PATIENT:  [name]  58 y.o. male  PRE-OPERATIVE DIAGNOSIS:  food bolus impaction, dysphagia  POST-OPERATIVE DIAGNOSIS:  food impaction, pushed into stomach, gastric ulcers, biopsies  PROCEDURE:  Procedure(s): ESOPHAGOGASTRODUODENOSCOPY (EGD) WITH PROPOFOL (N/A) BIOPSY  SURGEON:  Surgeon(s) and Role:    Ronnette Juniper, MD - Primary  PHYSICIAN ASSISTANT:   ASSISTANTS: Fredderick Phenix, RN, Laverda Sorenson, Tech  ANESTHESIA:   MAC  EBL:  Minimal  BLOOD ADMINISTERED:none  DRAINS: none   LOCAL MEDICATIONS USED:  NONE  SPECIMEN:  Biopsy / Limited Resection  DISPOSITION OF SPECIMEN:  PATHOLOGY  COUNTS:  YES  TOURNIQUET:  * No tourniquets in log *  DICTATION: .Dragon Dictation  PLAN OF CARE: Discharge to home after PACU  PATIENT DISPOSITION:  PACU - hemodynamically stable.   Delay start of Pharmacological VTE agent (>24hrs) due to surgical blood loss or risk of bleeding: no

## 2019-02-19 NOTE — ED Triage Notes (Signed)
Pt states he believes a piece of pork chop got stuck in esophagus while eating last night.  States he is able to swallow secretions but has not been able to drink anything.  Reports ETOH abuse and unable to drink since last night.

## 2019-02-19 NOTE — ED Notes (Signed)
Pt is awake, A&O x 4 and currently drinking a sprite w/o difficulty.

## 2019-02-19 NOTE — ED Notes (Signed)
Patient spouse calling would like a call back, states she's not heard anything since he got here. Is very worried for him.  [place]

## 2019-02-19 NOTE — Transfer of Care (Signed)
Immediate Anesthesia Transfer of Care Note  Patient: [name]  Procedure(s) Performed: ESOPHAGOGASTRODUODENOSCOPY (EGD) WITH PROPOFOL (N/A ) BIOPSY  Patient Location: Endoscopy Unit  Anesthesia Type:General  Level of Consciousness: awake and alert   Airway & Oxygen Therapy: Patient Spontanous Breathing and Patient connected to nasal cannula oxygen  Post-op Assessment: Report given to RN and Post -op Vital signs reviewed and stable  Post vital signs: Reviewed and stable  Last Vitals:  Vitals Value Taken Time  BP    Temp    Pulse    Resp    SpO2      Last Pain:  Vitals:   02/19/19 1729  TempSrc: Temporal  PainSc: 5          Complications: No apparent anesthesia complications

## 2019-02-20 NOTE — Anesthesia Postprocedure Evaluation (Signed)
Anesthesia Post Note  Patient: [name]  Procedure(s) Performed: ESOPHAGOGASTRODUODENOSCOPY (EGD) WITH PROPOFOL (N/A ) BIOPSY FOREIGN BODY REMOVAL     Patient location during evaluation: Endoscopy Anesthesia Type: General Level of consciousness: awake and alert Pain management: pain level controlled Vital Signs Assessment: post-procedure vital signs reviewed and stable Respiratory status: spontaneous breathing, nonlabored ventilation, respiratory function stable and patient connected to nasal cannula oxygen Cardiovascular status: blood pressure returned to baseline and stable Postop Assessment: no apparent nausea or vomiting Anesthetic complications: no    Last Vitals:  Vitals:   02/19/19 1930 02/19/19 1945  BP: (!) 149/79 (!) 150/69  Pulse: 82 79  Resp: 19 (!) 22  Temp:    SpO2: 97% 99%    Last Pain:  Vitals:   02/19/19 2100  TempSrc:   PainSc: 6                  Diedra Sinor P Layden Caterino

## 2019-02-21 ENCOUNTER — Encounter (HOSPITAL_COMMUNITY): Payer: Self-pay | Admitting: Gastroenterology

## 2019-03-13 ENCOUNTER — Other Ambulatory Visit: Payer: Self-pay | Admitting: Neurological Surgery

## 2019-03-27 ENCOUNTER — Other Ambulatory Visit (HOSPITAL_COMMUNITY): Payer: Medicaid Other

## 2019-03-27 ENCOUNTER — Inpatient Hospital Stay (HOSPITAL_COMMUNITY)
Admission: RE | Admit: 2019-03-27 | Discharge: 2019-03-27 | Disposition: A | Discharge status: Home / Self Care | Payer: Medicaid Other | Source: Ambulatory Visit

## 2019-03-27 NOTE — Pre-Procedure Instructions (Signed)
[facility] DRUG STORE [place], [place] AT [place] [address] Phone: [phone] Fax: [phone]      Your procedure is scheduled on Thursday, October 22nd.  Report to [facility] Main Entrance "A" at 5:30 A.M., and check in at the Admitting office.  Call this number if you have problems the morning of surgery:  [phone]  Call [phone] if you have any questions prior to your surgery date Monday-Friday 8am-4pm    Remember:  Do not eat or drink after midnight the night before your surgery    Take these medicines the morning of surgery with A SIP OF WATER: metoprolol tartrate (LOPRESSOR)  As needed: HYDROcodone-acetaminophen (NORCO/VICODIN), methocarbamol (ROBAXIN).    As of today, STOP taking any Aspirin (unless otherwise instructed by your surgeon), Aleve, Naproxen, Ibuprofen, Motrin, Advil, Goody's, BC's, all herbal medications, fish oil, and all vitamins.    The Morning of Surgery  Do not wear jewelry.  Do not wear lotions, powders, or colognes, or deodorant  Men may shave face and neck.  Do not bring valuables to the hospital.  [facility] is not responsible for any belongings or valuables.  If you are a smoker, DO NOT Smoke 24 hours prior to surgery IF you wear a CPAP at night please bring your mask, tubing, and machine the morning of surgery   Remember that you must have someone to transport you home after your surgery, and remain with you for 24 hours if you are discharged the same day.   Contacts, glasses, hearing aids, dentures or bridgework may not be worn into surgery.    Leave your suitcase in the car.  After surgery it may be brought to your room.  For patients admitted to the hospital, discharge time will be determined by your treatment team.  Patients discharged the day of surgery will not be allowed to drive home.    Special instructions:   [place]-  Preparing For Surgery  Before surgery, you can play an important role. Because skin is not sterile, your skin needs to be as free of germs as possible. You can reduce the number of germs on your skin by washing with CHG (chlorahexidine gluconate) Soap before surgery.  CHG is an antiseptic cleaner which kills germs and bonds with the skin to continue killing germs even after washing.    Oral Hygiene is also important to reduce your risk of infection.  Remember - BRUSH YOUR TEETH THE MORNING OF SURGERY WITH YOUR REGULAR TOOTHPASTE  Please do not use if you have an allergy to CHG or antibacterial soaps. If your skin becomes reddened/irritated stop using the CHG.  Do not shave (including legs and underarms) for at least 48 hours prior to first CHG shower. It is OK to shave your face.  Please follow these instructions carefully.   1. Shower the NIGHT BEFORE SURGERY and the MORNING OF SURGERY with CHG Soap.   2. If you chose to wash your hair, wash your hair first as usual with your normal shampoo.  3. After you shampoo, rinse your hair and body thoroughly to remove the shampoo.  4. Use CHG as you would any other liquid soap. You can apply CHG directly to the skin and wash gently with a scrungie or a clean washcloth.   5. Apply the CHG Soap to your body ONLY FROM THE NECK DOWN.  Do not use on open  wounds or open sores. Avoid contact with your eyes, ears, mouth and genitals (private parts). Wash Face and genitals (private parts)  with your normal soap.   6. Wash thoroughly, paying special attention to the area where your surgery will be performed.  7. Thoroughly rinse your body with warm water from the neck down.  8. DO NOT shower/wash with your normal soap after using and rinsing off the CHG Soap.  9. Pat yourself dry with a CLEAN TOWEL.  10. Wear CLEAN PAJAMAS to bed the night before surgery, wear comfortable clothes the morning of surgery  11. Place CLEAN SHEETS on your bed the night of  your first shower and DO NOT SLEEP WITH PETS.    Day of Surgery:  Do not apply any deodorants/lotions. Please shower the morning of surgery with the CHG soap  Please wear clean clothes to the hospital/surgery center.   Remember to brush your teeth WITH YOUR REGULAR TOOTHPASTE.   Please read over the following fact sheets that you were given.

## 2019-03-27 NOTE — Progress Notes (Signed)
Pt was a no show to PAT appointment. Pt contacted and stated that surgery had been rescheduled d/t insurance reasons. Pt was told to not come to PAT appointment today. PAT scheduler notified for rescheduling.   Jacqlyn Larsen, RN

## 2019-03-30 ENCOUNTER — Other Ambulatory Visit: Payer: Self-pay

## 2019-03-30 DIAGNOSIS — Z20822 Contact with and (suspected) exposure to covid-19: Secondary | ICD-10-CM

## 2019-04-01 ENCOUNTER — Telehealth: Payer: Self-pay

## 2019-04-01 LAB — NOVEL CORONAVIRUS, NAA: SARS-CoV-2, NAA: NOT DETECTED

## 2019-04-01 NOTE — Telephone Encounter (Signed)
Pt called for covid test results- advised test results are not back.

## 2019-04-11 NOTE — Progress Notes (Signed)
[facility] DRUG STORE [place], [place] AT [place] [address] Phone: [phone] Fax: [phone]      Your procedure is scheduled on Tuesday, November 10th, 2020.   Report to [facility] Main Entrance "A" at 5:30 A.M., and check in at the Admitting office.   Call this number if you have problems the morning of surgery:  [phone]  Call [phone] if you have any questions prior to your surgery date Monday-Friday 8am-4pm    Remember:  Do not eat or drink after midnight the night before your surgery    Take these medicines the morning of surgery with A SIP OF WATER :  Metoprolol Tartrate (Lopressor) Pantoprazole (Protonix)  If needed: Hydrocodone-Aceteminophen (NORCO/Vicodin) Methocarbamol (Robaxin) Oxycodone-Acetaminophen (PERCOCET/Roxicet)  7 days prior to surgery STOP taking any Aspirin (unless otherwise instructed by your surgeon), Aleve, Naproxen, Ibuprofen, Motrin, Advil, Goody's, BC's, all herbal medications, fish oil, and all vitamins.    The Morning of Surgery  Do not wear jewelry, make-up or nail polish.  Do not wear lotions, powders, or perfumes/colognes, or deodorant  Do not shave 48 hours prior to surgery.  Men may shave face and neck.  Do not bring valuables to the hospital.  [facility] is not responsible for any belongings or valuables.  If you are a smoker, DO NOT Smoke 24 hours prior to surgery IF you wear a CPAP at night please bring your mask, tubing, and machine the morning of surgery   Remember that you must have someone to transport you home after your surgery, and remain with you for 24 hours if you are discharged the same day.   Contacts, glasses, hearing aids, dentures or bridgework may not be worn into surgery.    Leave your suitcase in the car.  After surgery it may be brought to your room.  For patients admitted to the hospital, discharge time will be  determined by your treatment team.  Patients discharged the day of surgery will not be allowed to drive home.    Special instructions:   [place]- Preparing For Surgery  Before surgery, you can play an important role. Because skin is not sterile, your skin needs to be as free of germs as possible. You can reduce the number of germs on your skin by washing with CHG (chlorahexidine gluconate) Soap before surgery.  CHG is an antiseptic cleaner which kills germs and bonds with the skin to continue killing germs even after washing.    Oral Hygiene is also important to reduce your risk of infection.  Remember - BRUSH YOUR TEETH THE MORNING OF SURGERY WITH YOUR REGULAR TOOTHPASTE  Please do not use if you have an allergy to CHG or antibacterial soaps. If your skin becomes reddened/irritated stop using the CHG.  Do not shave (including legs and underarms) for at least 48 hours prior to first CHG shower. It is OK to shave your face.  Please follow these instructions carefully.   1. Shower the NIGHT BEFORE SURGERY and the MORNING OF SURGERY with CHG Soap.   2. If you chose to wash your hair, wash your hair first as usual with your normal shampoo.  3. After you shampoo, rinse your hair and body thoroughly to remove the shampoo.  4. Use CHG as you would any other liquid soap. You can apply CHG directly to the skin and wash gently with a scrungie or a clean  washcloth.   5. Apply the CHG Soap to your body ONLY FROM THE NECK DOWN.  Do not use on open wounds or open sores. Avoid contact with your eyes, ears, mouth and genitals (private parts). Wash Face and genitals (private parts)  with your normal soap.   6. Wash thoroughly, paying special attention to the area where your surgery will be performed.  7. Thoroughly rinse your body with warm water from the neck down.  8. DO NOT shower/wash with your normal soap after using and rinsing off the CHG Soap.  9. Pat yourself dry with a CLEAN  TOWEL.  10. Wear CLEAN PAJAMAS to bed the night before surgery, wear comfortable clothes the morning of surgery  11. Place CLEAN SHEETS on your bed the night of your first shower and DO NOT SLEEP WITH PETS.    Day of Surgery:  Do not apply any deodorants/lotions. Please shower the morning of surgery with the CHG soap  Please wear clean clothes to the hospital/surgery center.   Remember to brush your teeth WITH YOUR REGULAR TOOTHPASTE.   Please read over the following fact sheets that you were given.

## 2019-04-12 ENCOUNTER — Encounter (HOSPITAL_COMMUNITY): Payer: Self-pay

## 2019-04-12 ENCOUNTER — Inpatient Hospital Stay (HOSPITAL_COMMUNITY): Payer: Medicaid Other | Admitting: Certified Registered Nurse Anesthetist

## 2019-04-12 ENCOUNTER — Other Ambulatory Visit: Payer: Self-pay

## 2019-04-12 ENCOUNTER — Encounter (HOSPITAL_COMMUNITY)
Admission: RE | Admit: 2019-04-12 | Discharge: 2019-04-12 | Disposition: A | Discharge status: Home / Self Care | Payer: Medicaid Other | Source: Ambulatory Visit | Attending: Neurological Surgery | Admitting: Neurological Surgery

## 2019-04-12 ENCOUNTER — Inpatient Hospital Stay (HOSPITAL_COMMUNITY): Payer: Medicaid Other | Admitting: Physician Assistant

## 2019-04-12 DIAGNOSIS — R000 Tachycardia, unspecified: Secondary | ICD-10-CM | POA: Insufficient documentation

## 2019-04-12 DIAGNOSIS — Z01818 Encounter for other preprocedural examination: Secondary | ICD-10-CM | POA: Insufficient documentation

## 2019-04-12 LAB — COMPREHENSIVE METABOLIC PANEL
ALT: 10 U/L (ref 0–44)
AST: 16 U/L (ref 15–41)
Albumin: 2.8 g/dL — ABNORMAL LOW (ref 3.5–5.0)
Alkaline Phosphatase: 87 U/L (ref 38–126)
Anion gap: 17 — ABNORMAL HIGH (ref 5–15)
BUN: 6 mg/dL (ref 6–20)
CO2: 19 mmol/L — ABNORMAL LOW (ref 22–32)
Calcium: 8.9 mg/dL (ref 8.9–10.3)
Chloride: 97 mmol/L — ABNORMAL LOW (ref 98–111)
Creatinine, Ser: 0.68 mg/dL (ref 0.61–1.24)
GFR calc Af Amer: >60 mL/min (ref >60)
GFR calc non Af Amer: >60 mL/min (ref >60)
Glucose, Bld: 80 mg/dL (ref 70–99)
Potassium: 4.4 mmol/L (ref 3.5–5.1)
Sodium: 133 mmol/L — ABNORMAL LOW (ref 135–145)
Total Bilirubin: 0.6 mg/dL (ref 0.3–1.2)
Total Protein: 7.1 g/dL (ref 6.5–8.1)

## 2019-04-12 LAB — CBC
HCT: 37 % — ABNORMAL LOW (ref 39.0–52.0)
Hemoglobin: 11.1 g/dL — ABNORMAL LOW (ref 13.0–17.0)
MCH: 22.7 pg — ABNORMAL LOW (ref 26.0–34.0)
MCHC: 30 g/dL (ref 30.0–36.0)
MCV: 75.8 fL — ABNORMAL LOW (ref 80.0–100.0)
Platelets: 457 10*3/uL — ABNORMAL HIGH (ref 150–400)
RBC: 4.88 MIL/uL (ref 4.22–5.81)
RDW: 18.9 % — ABNORMAL HIGH (ref 11.5–15.5)
WBC: 11.8 10*3/uL — ABNORMAL HIGH (ref 4.0–10.5)
nRBC: 0 % (ref 0.0–0.2)

## 2019-04-12 LAB — SURGICAL PCR SCREEN
MRSA, PCR: NEGATIVE
Staphylococcus aureus: NEGATIVE

## 2019-04-12 NOTE — Progress Notes (Signed)
PCP - Denies Cardiologist - Denies  Chest x-ray - 02/19/19 EKG - 04/12/19 Stress Test - denies ECHO - denies Cardiac Cath - denies  Blood Thinner Instructions: N/A Aspirin Instructions: N/A  COVID TEST- scheduled 04/14/19; informed of need for self-quarantine; patient states verbal understanding  Coronavirus Screening  Have you experienced the following symptoms:  Cough yes/no: No Fever (>100.21F)  yes/no: No Runny nose yes/no: No Sore throat yes/no: No Difficulty breathing/shortness of breath  yes/no: No  Have you or a family member traveled in the last 14 days and where? yes/no: No  If the patient indicates "YES" to the above questions, their PAT will be rescheduled to limit the exposure to others and, the surgeon will be notified. THE PATIENT WILL NEED TO BE ASYMPTOMATIC FOR 14 DAYS.   If the patient is not experiencing any of these symptoms, the PAT nurse will instruct them to NOT bring anyone with them to their appointment since they may have these symptoms or traveled as well.   Please remind your patients and families that hospital visitation restrictions are in effect and the importance of the restrictions.   Anesthesia review: YES - Patient admitted to PAT w/ HR 137.  NT called RN for evaluation.  Karoline Caldwell, PA-C for anesthesia, notified and came for evaluation also.  Stat EKG confirmed Sinus Tachycardia w/HR 128. Patient had no other c/o or s/sx of distress.  Patient states he has only had two beers today; was not SOB or anxious appearing.  Stat labs were obtained at the start of the PAT visit for further evaluation per anesthesia.  PAT visit resumed.  Medication list reviewed w/patient and patient states he does not take any RX medications; takes only Goody's powders 4-5 times/day.  Patient denies any s/sx GI bleeding.  Patient states he doesn't know where he got the RX for Metoprolol 25mg  PO BID and he no longer has any of the medication, hasn't taken it for some time;  however, [facility] pharmacy was called and confirmed this medication was RX by Enid Cutter, PA-C for CVTS s/p VATS in June, 2020.  Patient states he has not had this medication refilled and denies a PCP.      HR 143 upon re-check prior to allowing patient to leave PAT.  Patient told that we would call w/follow-up when labs were resulted.  Patient to be advised to follow-up at Urgent Care for refill of Metoprolol 25mg  PO BID.  Dr. Tomasita Crumble office notified of concerns by Karoline Caldwell, PA-C.  Await further instructions re: surgery proceeding.  Patient denies shortness of breath, fever, cough and chest pain at PAT appointment  All instructions explained to the patient, with a verbal understanding of the material. Patient agrees to go over the instructions while at home for a better understanding. Patient also instructed to self quarantine after being tested for COVID-19. The opportunity to ask questions was provided.

## 2019-04-12 NOTE — Progress Notes (Signed)
Call placed to patient at home; permission given by patient to speak w/his significant other, [name].  Patient encouraged to follow-up at Urgent Care for tachycardia and need for Metoprolol RX refill.  Ms. Ronnald Ramp states they already have a "home" appointment w/Dr. Carmela Rima on Friday morning - this physician will be their PCP and will be making house calls.  Ms. Ronnald Ramp encouraged to share this information w/Dr. Carmela Rima re: tachycardia and follow-up w/Dr. Clarice Pole office re: plan of care.  She states Jessica from Dr. Clarice Pole office has already called them this afternoon since PAT appointment.  Will continue to proceed as planned unless cancelled by surgeon/anesthesia.

## 2019-04-13 LAB — TYPE AND SCREEN
ABO/RH(D): AB POS
Antibody Screen: NEGATIVE

## 2019-04-13 NOTE — Progress Notes (Signed)
Anesthesia Chart Review: Pt evaluated at PAT for tachycardia. Pt heart rate 120s on arrival. EKG showed sinus tachycardia, rate 128. Pt denied any CV symptoms, said he "felt normal". Current etoh abuse, pt drinks ~12 beers/day and reported two beers prior to arrival. He denied any recreational drug use. Review of history shows he was previously prescribed metoprolol after VATS for empyema in June. Pt reports he is no longer taking - actually reports he takes no prescribed medication but does take 5-6 Goody powders per day. Other than tachycardia, physical exam was benign, lungs were clear to auscultation bilaterally, no LE edema. Pt did seem to have difficulty with recall. When asked what surgery he was having he said he thought he was having surgery on his hip (he is scheduled for lumbar fusion), and he reported Dr. Ellene Route as his PCP, he was otherwise oriented x 3.  Stat CMP and CBC were ordered and were without significant abnormality. Pt was ultimately advised to be seen at urgent care to see if he could get restarted on metoprolol. Pt significant other reported that pt has appointment scheduled 11/6 for home visit from Alvester Chou, [address] and would discuss today's findings as well as upcoming surgery.  I called and spoke with Dr. Clarice Pole surgery scheduler. She sent surgical clearance forms to Alvester Chou, FNP and will await to hear back following assessment of pt at scheduled home visit on 11/6. I advised that if pt arrived for surgery in similar condition as he presented to PAT he would likely be cancelled as he did not appear optimized for surgery.   This note and lab results were routed to Alvester Chou, FNP through [employer].   Wynonia Musty [facility] Stay [facility]/Anesthesiology Phone [phone] 04/13/2019 1:46 PM

## 2019-04-14 ENCOUNTER — Other Ambulatory Visit (HOSPITAL_COMMUNITY)
Admission: RE | Admit: 2019-04-14 | Discharge: 2019-04-14 | Disposition: A | Discharge status: Home / Self Care | Payer: Medicaid Other | Source: Ambulatory Visit | Attending: Neurological Surgery | Admitting: Neurological Surgery

## 2019-04-14 DIAGNOSIS — Z20828 Contact with and (suspected) exposure to other viral communicable diseases: Secondary | ICD-10-CM | POA: Insufficient documentation

## 2019-04-14 DIAGNOSIS — Z01812 Encounter for preprocedural laboratory examination: Secondary | ICD-10-CM | POA: Diagnosis not present

## 2019-04-15 LAB — NOVEL CORONAVIRUS, NAA (HOSP ORDER, SEND-OUT TO REF LAB; TAT 18-24 HRS): SARS-CoV-2, NAA: NOT DETECTED

## 2019-04-17 NOTE — Anesthesia Preprocedure Evaluation (Addendum)
Anesthesia Evaluation  Patient identified by MRN, date of birth, ID band Patient awake    Reviewed: Allergy & Precautions, NPO status , Patient's Chart, lab work & pertinent test results, reviewed documented beta blocker date and time   Airway Mallampati: II  TM Distance: >3 FB Neck ROM: Full    Dental  (+) Dental Advisory Given, Poor Dentition, Edentulous Upper   Pulmonary neg pulmonary ROS, Current Smoker,    Pulmonary exam normal breath sounds clear to auscultation       Cardiovascular Pt. on home beta blockers Normal cardiovascular exam Rhythm:Regular Rate:Normal     Neuro/Psych negative neurological ROS  negative psych ROS   GI/Hepatic negative GI ROS, (+) Hepatitis -, C  Endo/Other  negative endocrine ROS  Renal/GU negative Renal ROS  negative genitourinary   Musculoskeletal negative musculoskeletal ROS (+)   Abdominal   Peds negative pediatric ROS (+)  Hematology negative hematology ROS (+)   Anesthesia Other Findings   Reproductive/Obstetrics negative OB ROS                                                            Anesthesia Evaluation  Patient identified by MRN, date of birth, ID band Patient awake    Reviewed: Allergy & Precautions, NPO status , Patient's Chart, lab work & pertinent test results  Airway Mallampati: II       Dental  (+) Poor Dentition, Edentulous Upper   Pulmonary COPD, Current SmokerPatient did not abstain from smoking.,  empyema right lung   Pulmonary exam normal        Cardiovascular Normal cardiovascular exam  ECG: rate 81. Sinus rhythm Right axis deviation   Neuro/Psych PSYCHIATRIC DISORDERS  Neuromuscular disease    GI/Hepatic negative GI ROS, (+)     substance abuse  , Hepatitis -, C  Endo/Other  negative endocrine ROS  Renal/GU negative Renal ROS     Musculoskeletal Back pain   Abdominal   Peds   Hematology  (+) anemia ,   Anesthesia Other Findings food bolus impaction, dysphagia  Reproductive/Obstetrics                            Anesthesia Physical Anesthesia Plan  ASA: III and emergent  Anesthesia Plan: General   Post-op Pain Management:    Induction: Intravenous and Rapid sequence  PONV Risk Score and Plan: 1 and Ondansetron, Dexamethasone, Treatment may vary due to age or medical condition and Midazolam  Airway Management Planned: Oral ETT  Additional Equipment:   Intra-op Plan:   Post-operative Plan: Extubation in OR  Informed Consent: I have reviewed the patients History and Physical, chart, labs and discussed the procedure including the risks, benefits and alternatives for the proposed anesthesia with the patient or authorized representative who has indicated his/her understanding and acceptance.     Dental advisory given  Plan Discussed with: CRNA  Anesthesia Plan Comments:        Anesthesia Quick Evaluation  Anesthesia Physical Anesthesia Plan  ASA: III  Anesthesia Plan: General   Post-op Pain Management:    Induction: Intravenous  PONV Risk Score and Plan: 2 and Ondansetron, Dexamethasone and Treatment may vary due to age or medical condition  Airway Management Planned: Oral ETT  Additional Equipment: None  Intra-op Plan:   Post-operative Plan: Extubation in OR  Informed Consent: I have reviewed the patients History and Physical, chart, labs and discussed the procedure including the risks, benefits and alternatives for the proposed anesthesia with the patient or authorized representative who has indicated his/her understanding and acceptance.     Dental advisory given  Plan Discussed with: CRNA  Anesthesia Plan Comments: (See PAT note by Karoline Caldwell, PA-C 04/12/19. Per Lorriane Shire at Dr. Clarice Pole office, the pt was seen and cleared by his PCP. They are awaiting clearance to be faxed and will send it as soon  as it is received.   No preoperative workup in chart this AM. Hold for OR until received. Pt giving conflicting reports of medications as he told RN he's not taking any medicine, but he told me that his PCP put him on a new medication.   Given this patient's uncontrolled medical problems, and his apparently terrible ETOH abuse, I believe he needs much better medical management prior to elective back surgery for pain. He needs a plan to improve his ETOH abuse, and a medical management plan for managing his withdrawal symptoms post surgery. Additionally he is heavily using Goody's (5-6) powder packs and thus may also be at high risk for intraoperative bleeding. His aspirin intake is ~2 grams per day.  )     Anesthesia Quick Evaluation

## 2019-04-18 ENCOUNTER — Encounter (HOSPITAL_COMMUNITY): Payer: Self-pay

## 2019-04-18 ENCOUNTER — Ambulatory Visit (HOSPITAL_COMMUNITY)
Admission: RE | Admit: 2019-04-18 | Discharge: 2019-04-18 | Disposition: A | Discharge status: Home / Self Care | Payer: Medicaid Other | Attending: Neurological Surgery | Admitting: Neurological Surgery

## 2019-04-18 ENCOUNTER — Inpatient Hospital Stay (HOSPITAL_COMMUNITY): Payer: Medicaid Other

## 2019-04-18 ENCOUNTER — Other Ambulatory Visit: Payer: Self-pay

## 2019-04-18 ENCOUNTER — Encounter (HOSPITAL_COMMUNITY)
Admission: RE | Disposition: A | Discharge status: Home / Self Care | Payer: Self-pay | Source: Home / Self Care | Attending: Neurological Surgery

## 2019-04-18 DIAGNOSIS — Z79899 Other long term (current) drug therapy: Secondary | ICD-10-CM | POA: Insufficient documentation

## 2019-04-18 DIAGNOSIS — Z880 Allergy status to penicillin: Secondary | ICD-10-CM | POA: Diagnosis not present

## 2019-04-18 DIAGNOSIS — M4807 Spinal stenosis, lumbosacral region: Secondary | ICD-10-CM | POA: Diagnosis not present

## 2019-04-18 DIAGNOSIS — D649 Anemia, unspecified: Secondary | ICD-10-CM | POA: Insufficient documentation

## 2019-04-18 DIAGNOSIS — M48061 Spinal stenosis, lumbar region without neurogenic claudication: Secondary | ICD-10-CM | POA: Diagnosis not present

## 2019-04-18 DIAGNOSIS — J449 Chronic obstructive pulmonary disease, unspecified: Secondary | ICD-10-CM | POA: Insufficient documentation

## 2019-04-18 DIAGNOSIS — M549 Dorsalgia, unspecified: Secondary | ICD-10-CM | POA: Diagnosis present

## 2019-04-18 DIAGNOSIS — Z419 Encounter for procedure for purposes other than remedying health state, unspecified: Secondary | ICD-10-CM

## 2019-04-18 DIAGNOSIS — M4726 Other spondylosis with radiculopathy, lumbar region: Secondary | ICD-10-CM | POA: Diagnosis not present

## 2019-04-18 DIAGNOSIS — Z5309 Procedure and treatment not carried out because of other contraindication: Secondary | ICD-10-CM | POA: Diagnosis not present

## 2019-04-18 DIAGNOSIS — F17210 Nicotine dependence, cigarettes, uncomplicated: Secondary | ICD-10-CM | POA: Insufficient documentation

## 2019-04-18 DIAGNOSIS — F1020 Alcohol dependence, uncomplicated: Secondary | ICD-10-CM | POA: Insufficient documentation

## 2019-04-18 SURGERY — POSTERIOR LUMBAR FUSION 2 LEVEL
Anesthesia: General | Site: Back

## 2019-04-18 MED ORDER — DEXAMETHASONE SODIUM PHOSPHATE 10 MG/ML IJ SOLN
INTRAMUSCULAR | Status: AC
  Filled 2019-04-18: qty 1

## 2019-04-18 MED ORDER — MIDAZOLAM HCL 2 MG/2ML IJ SOLN
INTRAMUSCULAR | Status: AC
  Filled 2019-04-18: qty 2

## 2019-04-18 MED ORDER — CHLORHEXIDINE GLUCONATE CLOTH 2 % EX PADS: 6.0000 | MEDICATED_PAD | Freq: Once | CUTANEOUS | Status: DC

## 2019-04-18 MED ORDER — FENTANYL CITRATE (PF) 250 MCG/5ML IJ SOLN
INTRAMUSCULAR | Status: AC
  Filled 2019-04-18: qty 5

## 2019-04-18 MED ORDER — THROMBIN 5000 UNITS EX SOLR
CUTANEOUS | Status: AC
  Filled 2019-04-18: qty 5000

## 2019-04-18 MED ORDER — ROCURONIUM BROMIDE 10 MG/ML (PF) SYRINGE
PREFILLED_SYRINGE | INTRAVENOUS | Status: AC
  Filled 2019-04-18: qty 10

## 2019-04-18 MED ORDER — VANCOMYCIN HCL IN DEXTROSE 1-5 GM/200ML-% IV SOLN
1000.0000 mg | INTRAVENOUS | Status: AC
  Administered 2019-04-18: 1000 mg via INTRAVENOUS

## 2019-04-18 MED ORDER — THROMBIN 20000 UNITS EX SOLR
CUTANEOUS | Status: AC
  Filled 2019-04-18: qty 20000

## 2019-04-18 MED ORDER — BUPIVACAINE HCL (PF) 0.5 % IJ SOLN
INTRAMUSCULAR | Status: AC
  Filled 2019-04-18: qty 30

## 2019-04-18 MED ORDER — LIDOCAINE 2% (20 MG/ML) 5 ML SYRINGE
INTRAMUSCULAR | Status: AC
  Filled 2019-04-18: qty 5

## 2019-04-18 MED ORDER — ONDANSETRON HCL 4 MG/2ML IJ SOLN
INTRAMUSCULAR | Status: AC
  Filled 2019-04-18: qty 2

## 2019-04-18 MED ORDER — LIDOCAINE-EPINEPHRINE 1 %-1:100000 IJ SOLN
INTRAMUSCULAR | Status: AC
  Filled 2019-04-18: qty 1

## 2019-04-18 MED ORDER — PROPOFOL 10 MG/ML IV BOLUS
INTRAVENOUS | Status: AC
  Filled 2019-04-18: qty 20

## 2019-04-18 MED ORDER — LACTATED RINGERS IV SOLN
INTRAVENOUS | Status: DC
  Administered 2019-04-18: 07:00:00 via INTRAVENOUS

## 2019-04-18 NOTE — H&P (Signed)
[name]. is an 58 y.o. male.   Chief Complaint: Back and bilateral lower extremity pain HPI: [name] is a 58 year old individual whose had significant problems with back and bilateral lower extremity pain worse on the left than on the right.  He has had significant degenerative disc disease in the lower lumbar spine at L4-5 and L5-S1 after being seen couple of months ago I advised surgical decompression and arthrodesis of his lower lumbar spine he is now admitted for that procedure.  Past Medical History:  Diagnosis Date  . Alcohol abuse   . Alcoholic ([place])   . Back pain   . CAP (community acquired pneumonia) 11/26/2018  . COPD (chronic obstructive pulmonary disease) ([place])   . Empyema of right pleural space ([place]) 11/26/2018  . Heavy smoker   . Hep C w/ coma, chronic ([place])   . Loose, teeth   . Pancreatitis   . Poor dentition   . Tobacco abuse     Past Surgical History:  Procedure Laterality Date  . ARTERY REPAIR  2013   neck  . BIOPSY  02/19/2019   Procedure: BIOPSY;  Surgeon: Ronnette Juniper, MD;  Location: [facility] ENDOSCOPY;  Service: Gastroenterology;;  . ESOPHAGOGASTRODUODENOSCOPY (EGD) WITH PROPOFOL N/A 02/19/2019   Procedure: ESOPHAGOGASTRODUODENOSCOPY (EGD) WITH PROPOFOL;  Surgeon: Ronnette Juniper, MD;  Location: [place];  Service: Gastroenterology;  Laterality: N/A;  . FOREIGN BODY REMOVAL  02/19/2019   Procedure: FOREIGN BODY REMOVAL;  Surgeon: Ronnette Juniper, MD;  Location: [facility] ENDOSCOPY;  Service: Gastroenterology;;  . KNEE SURGERY  2013   rt  . MICROLARYNGOSCOPY N/A 07/31/2014   Procedure: MICROLARYNGOSCOPY WITH BIOPSY ;  Surgeon: Rozetta Nunnery, MD;  Location: [place];  Service: ENT;  Laterality: N/A;  . NECK SURGERY  2013   cervical fusion-  . VASCULAR SURGERY    . VIDEO ASSISTED THORACOSCOPY (VATS)/EMPYEMA Right 11/26/2018   Procedure: VIDEO ASSISTED THORACOSCOPY (VATS)/EMPYEMA;  Surgeon: Rexene Alberts, MD;  Location: [place];  Service:  Thoracic;  Laterality: Right;  [name] VIDEO BRONCHOSCOPY  11/26/2018   Procedure: Video Bronchoscopy;  Surgeon: Rexene Alberts, MD;  Location: [place];  Service: Thoracic;;    History reviewed. No pertinent family history. Social History:  reports that he has been smoking cigarettes. He has been smoking about 2.00 packs per day. He has never used smokeless tobacco. He reports current alcohol use. He reports that he does not use drugs.  Allergies:  Allergies  Allergen Reactions  . Penicillins Other (See Comments)    Tolerated Zosyn 11/2018 Has patient had a PCN reaction causing immediate rash, facial/tongue/throat swelling, SOB or lightheadedness with hypotension: childhood allergy Has patient had a PCN reaction causing severe rash involving mucus membranes or skin necrosis: childhood allergy Has patient had a PCN reaction that required hospitalization childhood allergy Has patient had a PCN reaction occurring within the last 10 years: childhood allergy If all of the above answers are "NO", then may proceed wit    Medications Prior to Admission  Medication Sig Dispense Refill  . Aspirin-Acetaminophen-Caffeine (GOODYS EXTRA STRENGTH) 500-325-65 MG PACK Take 1 Package by mouth 4 (four) times daily as needed (pain).     . metoprolol tartrate (LOPRESSOR) 25 MG tablet Take 1 tablet (25 mg total) by mouth 2 (two) times daily. (Patient not taking: Reported on 04/12/2019) 60 tablet 2  . oxyCODONE-acetaminophen (PERCOCET/ROXICET) 5-325 MG tablet Take 1 tablet by mouth every 6 (six) hours as needed for severe pain. (Patient not taking:  Reported on 03/22/2019) 20 tablet 0  . pantoprazole (PROTONIX) 40 MG tablet Take 1 tablet (40 mg total) by mouth 2 (two) times daily. (Patient not taking: Reported on 03/22/2019) 180 tablet 3    No results found for this or any previous visit (from the past 48 hour(s)). No results found.  Review of Systems  Constitutional: Negative.   HENT: Negative.   Eyes: Negative.    Respiratory: Negative.   Cardiovascular: Negative.   Gastrointestinal: Negative.   Genitourinary: Negative.   Musculoskeletal: Positive for back pain.  Skin: Negative.   Neurological: Positive for tingling, speech change and weakness.  Endo/Heme/Allergies: Negative.   Psychiatric/Behavioral: Negative.     Blood pressure 125/72, pulse 69, temperature 97.6 F (36.4 C), temperature source Oral, resp. rate 20, height 5\' 8"  (1.727 m), weight 64.5 kg, SpO2 97 %. Physical Exam  Constitutional: He is oriented to person, place, and time. He appears well-developed and well-nourished.  HENT:  Head: Normocephalic and atraumatic.  Eyes: Pupils are equal, round, and reactive to light. Conjunctivae and EOM are normal.  Neck: Normal range of motion. Neck supple.  GI: Soft. Bowel sounds are normal.  Musculoskeletal:     Comments: Positive straight leg raising at 30 degrees in either lower extremity Patrick's maneuver is negative bilaterally.  Neurological: He is alert and oriented to person, place, and time.  Decreased function in tibialis anterior on the left at 4- out of 5 decreased function gastroc on the right 4 out of 5 absent reflexes in the patella and the Achilles 3+ reflexes in the biceps 2+ triceps 1+ in the brachioradialis.  Cranial nerve examination is within the limits of normal.  Skin: Skin is warm and dry.  Psychiatric: He has a normal mood and affect. His behavior is normal. Judgment and thought content normal.     Assessment/Plan Lumbar spondylosis with stenosis L4-5 and L5-S1.  Lumbar radiculopathy.  Plan: Decompression and fusion L4-5 and L5-S1.  Earleen [place], MD 04/18/2019, 7:46 AM

## 2019-04-18 NOTE — Progress Notes (Signed)
Patient's surgery being cancelled.  Patient's IV removed with catheter intact.  Patient taken in wheelchair to main entrance.

## 2019-04-18 NOTE — Progress Notes (Signed)
OR staff was notified that case was cancelled at 08:15. All opened materials, meds and equipment were wasted.

## 2019-04-18 NOTE — Progress Notes (Signed)
Patient ID: William Holster., male   DOB: 1961-04-06, 58 y.o.   MRN: [id number] Patient surgery canceled this a.m.  Discussed with Dr. Lissa Hoard from anesthesia and he is concerned that the patient's work-up has not been adequate nor has he had any time with sobriety.  He is not intoxicated currently.  I discussed with the patient his drinking habits and he admits to 12 beers per day pretty much daily.  I noted that he had appeared to PAT intoxicated.  I discussed with him the concern regarding general anesthesia with his heart rate and blood pressure recently been uncontrolled and his drinking history.  I advised that he needs to have a period of sobriety for about 30 days to make the surgery safer.  He notes that he has had sober periods in the past the last one he notes was about 8 years ago while in prison for 90 days.  In light of anesthesias concerns surgery is canceled today and he will need to pursue further conservative care.  I have suggested that he follow-up with his primary care doc in a couple of weeks and I would like to see him in a month's time to check his progress if he is able to maintain some level of his sobriety and his vital signs are stabilizing we can reschedule his surgery at that time.  I asked him if he requires any help to get and maintain sobriety and he notes that he is already been aware of programs and participated with them to some degree.  The surgery that is planned is necessary for his pain control but does involve significant risks and involved so far as it is elective it is incumbent on him to prepare himself best medically to tolerate the surgery.  I will plan a visit with him in a month's time.

## 2019-05-01 ENCOUNTER — Other Ambulatory Visit: Payer: Self-pay

## 2019-05-01 ENCOUNTER — Inpatient Hospital Stay (HOSPITAL_COMMUNITY)
Admission: EM | Admit: 2019-05-01 | Discharge: 2019-05-05 | DRG: 640 | Disposition: A | Discharge status: Home / Self Care | Payer: Medicaid Other | Attending: Family Medicine | Admitting: Family Medicine

## 2019-05-01 ENCOUNTER — Encounter (HOSPITAL_COMMUNITY): Payer: Self-pay | Admitting: Emergency Medicine

## 2019-05-01 DIAGNOSIS — J449 Chronic obstructive pulmonary disease, unspecified: Secondary | ICD-10-CM | POA: Diagnosis present

## 2019-05-01 DIAGNOSIS — Z20828 Contact with and (suspected) exposure to other viral communicable diseases: Secondary | ICD-10-CM | POA: Diagnosis present

## 2019-05-01 DIAGNOSIS — Z79899 Other long term (current) drug therapy: Secondary | ICD-10-CM | POA: Diagnosis not present

## 2019-05-01 DIAGNOSIS — I10 Essential (primary) hypertension: Secondary | ICD-10-CM | POA: Diagnosis present

## 2019-05-01 DIAGNOSIS — D509 Iron deficiency anemia, unspecified: Secondary | ICD-10-CM | POA: Diagnosis present

## 2019-05-01 DIAGNOSIS — Z880 Allergy status to penicillin: Secondary | ICD-10-CM | POA: Diagnosis not present

## 2019-05-01 DIAGNOSIS — F1010 Alcohol abuse, uncomplicated: Secondary | ICD-10-CM | POA: Diagnosis not present

## 2019-05-01 DIAGNOSIS — B182 Chronic viral hepatitis C: Secondary | ICD-10-CM | POA: Diagnosis present

## 2019-05-01 DIAGNOSIS — E871 Hypo-osmolality and hyponatremia: Principal | ICD-10-CM

## 2019-05-01 DIAGNOSIS — M5116 Intervertebral disc disorders with radiculopathy, lumbar region: Secondary | ICD-10-CM | POA: Diagnosis present

## 2019-05-01 DIAGNOSIS — K852 Alcohol induced acute pancreatitis without necrosis or infection: Secondary | ICD-10-CM | POA: Insufficient documentation

## 2019-05-01 DIAGNOSIS — D649 Anemia, unspecified: Secondary | ICD-10-CM

## 2019-05-01 DIAGNOSIS — F10230 Alcohol dependence with withdrawal, uncomplicated: Secondary | ICD-10-CM

## 2019-05-01 DIAGNOSIS — I472 Ventricular tachycardia: Secondary | ICD-10-CM | POA: Diagnosis present

## 2019-05-01 DIAGNOSIS — K644 Residual hemorrhoidal skin tags: Secondary | ICD-10-CM | POA: Diagnosis present

## 2019-05-01 DIAGNOSIS — K859 Acute pancreatitis without necrosis or infection, unspecified: Secondary | ICD-10-CM | POA: Diagnosis present

## 2019-05-01 DIAGNOSIS — K625 Hemorrhage of anus and rectum: Secondary | ICD-10-CM | POA: Diagnosis present

## 2019-05-01 DIAGNOSIS — J90 Pleural effusion, not elsewhere classified: Secondary | ICD-10-CM | POA: Diagnosis present

## 2019-05-01 DIAGNOSIS — R1013 Epigastric pain: Secondary | ICD-10-CM

## 2019-05-01 DIAGNOSIS — K739 Chronic hepatitis, unspecified: Secondary | ICD-10-CM | POA: Diagnosis not present

## 2019-05-01 DIAGNOSIS — F17210 Nicotine dependence, cigarettes, uncomplicated: Secondary | ICD-10-CM | POA: Diagnosis present

## 2019-05-01 LAB — URINALYSIS, ROUTINE W REFLEX MICROSCOPIC
Bacteria, UA: NONE SEEN
Bilirubin Urine: NEGATIVE
Glucose, UA: NEGATIVE mg/dL
Hgb urine dipstick: NEGATIVE
Ketones, ur: NEGATIVE mg/dL
Leukocytes,Ua: NEGATIVE
Nitrite: NEGATIVE
Protein, ur: 100 mg/dL — AB
Specific Gravity, Urine: 1.011 (ref 1.005–1.030)
pH: 7 (ref 5.0–8.0)

## 2019-05-01 LAB — COMPREHENSIVE METABOLIC PANEL
ALT: 13 U/L (ref 0–44)
AST: 18 U/L (ref 15–41)
Albumin: 3.4 g/dL — ABNORMAL LOW (ref 3.5–5.0)
Alkaline Phosphatase: 48 U/L (ref 38–126)
Anion gap: 14 (ref 5–15)
BUN: 13 mg/dL (ref 6–20)
CO2: 22 mmol/L (ref 22–32)
Calcium: 8.8 mg/dL — ABNORMAL LOW (ref 8.9–10.3)
Chloride: 81 mmol/L — ABNORMAL LOW (ref 98–111)
Creatinine, Ser: 0.69 mg/dL (ref 0.61–1.24)
GFR calc Af Amer: >60 mL/min (ref >60)
GFR calc non Af Amer: >60 mL/min (ref >60)
Glucose, Bld: 102 mg/dL — ABNORMAL HIGH (ref 70–99)
Potassium: 4.9 mmol/L (ref 3.5–5.1)
Sodium: 117 mmol/L — CL (ref 135–145)
Total Bilirubin: 1.5 mg/dL — ABNORMAL HIGH (ref 0.3–1.2)
Total Protein: 7 g/dL (ref 6.5–8.1)

## 2019-05-01 LAB — BASIC METABOLIC PANEL
Anion gap: 14 (ref 5–15)
Anion gap: 12 (ref 5–15)
BUN: 13 mg/dL (ref 6–20)
BUN: 15 mg/dL (ref 6–20)
CO2: 22 mmol/L (ref 22–32)
CO2: 23 mmol/L (ref 22–32)
Calcium: 8.7 mg/dL — ABNORMAL LOW (ref 8.9–10.3)
Calcium: 9 mg/dL (ref 8.9–10.3)
Chloride: 82 mmol/L — ABNORMAL LOW (ref 98–111)
Chloride: 84 mmol/L — ABNORMAL LOW (ref 98–111)
Creatinine, Ser: 0.72 mg/dL (ref 0.61–1.24)
Creatinine, Ser: 0.59 mg/dL — ABNORMAL LOW (ref 0.61–1.24)
GFR calc Af Amer: >60 mL/min (ref >60)
GFR calc Af Amer: >60 mL/min (ref >60)
GFR calc non Af Amer: >60 mL/min (ref >60)
GFR calc non Af Amer: >60 mL/min (ref >60)
Glucose, Bld: 131 mg/dL — ABNORMAL HIGH (ref 70–99)
Glucose, Bld: 107 mg/dL — ABNORMAL HIGH (ref 70–99)
Potassium: 4.4 mmol/L (ref 3.5–5.1)
Potassium: 4.5 mmol/L (ref 3.5–5.1)
Sodium: 118 mmol/L — CL (ref 135–145)
Sodium: 119 mmol/L — CL (ref 135–145)

## 2019-05-01 LAB — CBC
HCT: 40.1 % (ref 39.0–52.0)
Hemoglobin: 12.6 g/dL — ABNORMAL LOW (ref 13.0–17.0)
MCH: 23.1 pg — ABNORMAL LOW (ref 26.0–34.0)
MCHC: 31.4 g/dL (ref 30.0–36.0)
MCV: 73.6 fL — ABNORMAL LOW (ref 80.0–100.0)
Platelets: 519 10*3/uL — ABNORMAL HIGH (ref 150–400)
RBC: 5.45 MIL/uL (ref 4.22–5.81)
RDW: 19.6 % — ABNORMAL HIGH (ref 11.5–15.5)
WBC: 11.3 10*3/uL — ABNORMAL HIGH (ref 4.0–10.5)
nRBC: 0.3 % — ABNORMAL HIGH (ref 0.0–0.2)

## 2019-05-01 LAB — SARS CORONAVIRUS 2 (TAT 6-24 HRS): SARS Coronavirus 2: NEGATIVE

## 2019-05-01 LAB — PHOSPHORUS: Phosphorus: 4.1 mg/dL (ref 2.5–4.6)

## 2019-05-01 LAB — MAGNESIUM: Magnesium: 2 mg/dL (ref 1.7–2.4)

## 2019-05-01 LAB — PROTIME-INR
INR: 0.9 (ref 0.8–1.2)
Prothrombin Time: 12.3 seconds (ref 11.4–15.2)

## 2019-05-01 LAB — LIPASE, BLOOD: Lipase: 87 U/L — ABNORMAL HIGH (ref 11–51)

## 2019-05-01 MED ORDER — DEXTROSE-NACL 5-0.9 % IV SOLN
INTRAVENOUS | Status: DC
  Administered 2019-05-01: 22:00:00 via INTRAVENOUS

## 2019-05-01 MED ORDER — ONDANSETRON HCL 4 MG/2ML IJ SOLN: 4.0000 mg | Freq: Four times a day (QID) | INTRAMUSCULAR | Status: DC | PRN

## 2019-05-01 MED ORDER — SODIUM CHLORIDE 0.9 % IV BOLUS
1000.0000 mL | Freq: Once | INTRAVENOUS | Status: AC
  Administered 2019-05-01: 1000 mL via INTRAVENOUS

## 2019-05-01 MED ORDER — KETOROLAC TROMETHAMINE 15 MG/ML IJ SOLN: 15.0000 mg | Freq: Four times a day (QID) | INTRAMUSCULAR | Status: DC | PRN

## 2019-05-01 MED ORDER — LORAZEPAM 1 MG PO TABS
1.0000 mg | ORAL_TABLET | ORAL | Status: AC | PRN
  Administered 2019-05-02 – 2019-05-03 (×6): 1 mg via ORAL
  Filled 2019-05-01 (×7): qty 1

## 2019-05-01 MED ORDER — NICOTINE 14 MG/24HR TD PT24
14.0000 mg | MEDICATED_PATCH | Freq: Every day | TRANSDERMAL | Status: DC
  Administered 2019-05-01 – 2019-05-05 (×5): 14 mg via TRANSDERMAL
  Filled 2019-05-01 (×5): qty 1

## 2019-05-01 MED ORDER — HYDRALAZINE HCL 20 MG/ML IJ SOLN
10.0000 mg | Freq: Three times a day (TID) | INTRAMUSCULAR | Status: DC
  Administered 2019-05-01 – 2019-05-02 (×2): 10 mg via INTRAVENOUS
  Filled 2019-05-01 (×2): qty 1

## 2019-05-01 MED ORDER — ENOXAPARIN SODIUM 40 MG/0.4ML ~~LOC~~ SOLN: 40.0000 mg | SUBCUTANEOUS | Status: DC

## 2019-05-01 MED ORDER — MORPHINE SULFATE (PF) 2 MG/ML IV SOLN
1.0000 mg | Freq: Once | INTRAVENOUS | Status: AC
  Administered 2019-05-01: 1 mg via INTRAVENOUS
  Filled 2019-05-01: qty 1

## 2019-05-01 MED ORDER — VITAMIN B-1 100 MG PO TABS
100.0000 mg | ORAL_TABLET | Freq: Every day | ORAL | Status: DC
  Administered 2019-05-01 – 2019-05-05 (×5): 100 mg via ORAL
  Filled 2019-05-01 (×5): qty 1

## 2019-05-01 MED ORDER — THIAMINE HCL 100 MG/ML IJ SOLN
100.0000 mg | Freq: Every day | INTRAMUSCULAR | Status: DC
  Filled 2019-05-01: qty 2

## 2019-05-01 MED ORDER — LORAZEPAM 2 MG/ML IJ SOLN
1.0000 mg | INTRAMUSCULAR | Status: AC | PRN
  Administered 2019-05-01 (×2): 2 mg via INTRAVENOUS
  Filled 2019-05-01 (×3): qty 1

## 2019-05-01 MED ORDER — ENOXAPARIN SODIUM 40 MG/0.4ML ~~LOC~~ SOLN
40.0000 mg | SUBCUTANEOUS | Status: DC
  Administered 2019-05-01: 40 mg via SUBCUTANEOUS

## 2019-05-01 MED ORDER — ADULT MULTIVITAMIN W/MINERALS CH
1.0000 | ORAL_TABLET | Freq: Every day | ORAL | Status: DC
  Administered 2019-05-01 – 2019-05-05 (×5): 1 via ORAL
  Filled 2019-05-01 (×5): qty 1

## 2019-05-01 MED ORDER — MORPHINE SULFATE (PF) 2 MG/ML IV SOLN
1.0000 mg | INTRAVENOUS | Status: DC | PRN
  Administered 2019-05-01 – 2019-05-03 (×8): 1 mg via INTRAVENOUS
  Filled 2019-05-01 (×8): qty 1

## 2019-05-01 MED ORDER — DEXTROSE-NACL 5-0.9 % IV SOLN
Freq: Once | INTRAVENOUS | Status: DC
  Administered 2019-05-01: 17:00:00 via INTRAVENOUS

## 2019-05-01 MED ORDER — PANTOPRAZOLE SODIUM 40 MG IV SOLR
40.0000 mg | Freq: Every day | INTRAVENOUS | Status: DC
  Administered 2019-05-01 – 2019-05-02 (×2): 40 mg via INTRAVENOUS
  Filled 2019-05-01 (×2): qty 40

## 2019-05-01 MED ORDER — FOLIC ACID 1 MG PO TABS
1.0000 mg | ORAL_TABLET | Freq: Every day | ORAL | Status: DC
  Administered 2019-05-01 – 2019-05-05 (×5): 1 mg via ORAL
  Filled 2019-05-01 (×5): qty 1

## 2019-05-01 NOTE — ED Provider Notes (Signed)
[facility] EMERGENCY DEPARTMENT Provider Note   CSN: [id number] Arrival date & time: 05/01/19  1043     History   Chief Complaint Chief Complaint  Patient presents with  . Abdominal Pain    HPI [name]. is a 58 y.o. male past medical history of alcohol use, COPD, hepatitis C, pancreatitis, tobacco use, presenting to the emergency part abdominal pain.  He reports gradual onset of symptoms approximate 1 week ago.  He was reporting pain in his epigastrium and left upper quadrant ongoing for about a week.  He says it is constant, gnawing, burning pain.  It does not radiate anywhere.  He says similar symptoms in the past with pancreatitis.  He denies nausea or vomiting or diarrhea.    Of note, the patient drinks 12 beers a day.  His last beer was this morning around 8 9 AM.  He is beginning to feel that he may be withdrawing.  He denies history of withdrawal seizures.  He smokes 1 pack/day.  He denies any history of abdominal surgery.  He denies fevers, chills, cough, congestion.  Allergies Penicillins      HPI  Past Medical History:  Diagnosis Date  . Alcohol abuse   . Alcoholic ([place])   . Back pain   . CAP (community acquired pneumonia) 11/26/2018  . COPD (chronic obstructive pulmonary disease) ([place])   . Empyema of right pleural space ([place]) 11/26/2018  . Heavy smoker   . Hep C w/ coma, chronic ([place])   . Loose, teeth   . Pancreatitis   . Poor dentition   . Tobacco abuse     Patient Active Problem List   Diagnosis Date Noted  . CAP (community acquired pneumonia) 11/26/2018  . Empyema of right pleural space ([place]) 11/26/2018  . Empyema, right ([place]) 11/26/2018  . Alcohol abuse   . Tobacco abuse   . Poor dentition   . Radiculopathy due to lumbar intervertebral disc disorder 12/29/2017  . Chronic left-sided low back pain with left-sided sciatica 11/30/2017  . 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 ARTERY REPAIR  2013   neck  . BIOPSY  02/19/2019   Procedure: BIOPSY;  Surgeon: Ronnette Juniper, MD;  Location: [facility] ENDOSCOPY;  Service: Gastroenterology;;  . ESOPHAGOGASTRODUODENOSCOPY (EGD) WITH PROPOFOL N/A 02/19/2019   Procedure: ESOPHAGOGASTRODUODENOSCOPY (EGD) WITH PROPOFOL;  Surgeon: Ronnette Juniper, MD;  Location: [facility];  Service: Gastroenterology;  Laterality: N/A;  . FOREIGN BODY REMOVAL  02/19/2019   Procedure: FOREIGN BODY REMOVAL;  Surgeon: Ronnette Juniper, MD;  Location: [facility] ENDOSCOPY;  Service: Gastroenterology;;  . KNEE SURGERY  2013   rt  . MICROLARYNGOSCOPY N/A 07/31/2014   Procedure: MICROLARYNGOSCOPY WITH BIOPSY ;  Surgeon: Rozetta Nunnery, MD;  Location: [facility];  Service: ENT;  Laterality: N/A;  . NECK SURGERY  2013   cervical fusion-  . VASCULAR SURGERY    . VIDEO ASSISTED THORACOSCOPY (VATS)/EMPYEMA Right 11/26/2018   Procedure: VIDEO ASSISTED THORACOSCOPY (VATS)/EMPYEMA;  Surgeon: Rexene Alberts, MD;  Location: [facility];  Service: Thoracic;  Laterality: Right;  [name] VIDEO BRONCHOSCOPY  11/26/2018   Procedure: Video Bronchoscopy;  Surgeon: Rexene Alberts, MD;  Location: [facility];  Service: Thoracic;;        Home Medications    Prior to Admission medications   Medication Sig Start Date End Date Taking? Authorizing Provider  Aspirin-Acetaminophen-Caffeine ([place]) [phone]  MG PACK Take 1 Package by mouth 4 (four) times daily as needed (pain).     [provider]  metoprolol tartrate (LOPRESSOR) 25 MG tablet Take 1 tablet (25 mg total) by mouth 2 (two) times daily. Patient not taking: Reported on 04/12/2019 12/04/18   Antony Odea, PA-C  oxyCODONE-acetaminophen (PERCOCET/ROXICET) 5-325 MG tablet Take 1 tablet by mouth every 6 (six) hours as needed for severe pain. Patient not taking: Reported on 03/22/2019 12/13/18   Barrett, Lodema Hong, PA-C  pantoprazole  (PROTONIX) 40 MG tablet Take 1 tablet (40 mg total) by mouth 2 (two) times daily. Patient not taking: Reported on 03/22/2019 02/19/19 02/19/20  Ronnette Juniper, MD    Family History No family history on file.  Social History Social History   Tobacco Use  . Smoking status: Current Every Day Smoker    Packs/day: 2.00    Types: Cigarettes  . Smokeless tobacco: Never Used  Substance Use Topics  . Alcohol use: Yes    Comment: alcoholic (4 123XX123 a day). 12 pck a day  . Drug use: No     Allergies   Penicillins   Review of Systems Review of Systems  Constitutional: Negative for chills and fever.  Eyes: Negative for photophobia and visual disturbance.  Respiratory: Negative for cough and shortness of breath.   Cardiovascular: Negative for chest pain and palpitations.  Gastrointestinal: Positive for abdominal pain and nausea. Negative for constipation, diarrhea and vomiting.  Genitourinary: Negative for dysuria and hematuria.  Skin: Negative for pallor and rash.  Neurological: Negative for seizures, syncope, light-headedness and headaches.  All other systems reviewed and are negative.    Physical Exam Updated Vital Signs BP (!) 141/91   Pulse 89   Temp 98 F (36.7 C) (Oral)   Resp (!) 29   Ht 5\' 8"  (1.727 m)   Wt 64.5 kg   SpO2 100%   BMI 21.62 kg/m   Physical Exam Vitals signs and nursing note reviewed.  Constitutional:      Appearance: He is well-developed.  HENT:     Head: Normocephalic and atraumatic.  Eyes:     Conjunctiva/sclera: Conjunctivae normal.  Neck:     Musculoskeletal: Neck supple.  Cardiovascular:     Rate and Rhythm: Normal rate and regular rhythm.     Heart sounds: Normal heart sounds.  Pulmonary:     Effort: Pulmonary effort is normal. No respiratory distress.     Breath sounds: Normal breath sounds.  Abdominal:     Palpations: Abdomen is soft.     Tenderness: There is abdominal tenderness in the epigastric area and left upper quadrant. There  is no right CVA tenderness, left CVA tenderness or guarding. Negative signs include Murphy's sign, Rovsing's sign, McBurney's sign and psoas sign.  Skin:    General: Skin is warm and dry.  Neurological:     General: No focal deficit present.     Mental Status: He is alert and oriented to person, place, and time.  Psychiatric:        Mood and Affect: Mood normal.        Behavior: Behavior normal.      ED Treatments / Results  Labs (all labs ordered are listed, but only abnormal results are displayed) Labs Reviewed  LIPASE, BLOOD - Abnormal; Notable for the following components:      Result Value   Lipase 87 (*)    All other components within normal limits  COMPREHENSIVE METABOLIC PANEL - Abnormal; Notable  for the following components:   Sodium 117 (*)    Chloride 81 (*)    Glucose, Bld 102 (*)    Calcium 8.8 (*)    Albumin 3.4 (*)    Total Bilirubin 1.5 (*)    All other components within normal limits  CBC - Abnormal; Notable for the following components:   WBC 11.3 (*)    Hemoglobin 12.6 (*)    MCV 73.6 (*)    MCH 23.1 (*)    RDW 19.6 (*)    Platelets 519 (*)    nRBC 0.3 (*)    All other components within normal limits  SARS CORONAVIRUS 2 (TAT 6-24 HRS)  URINALYSIS, ROUTINE W REFLEX MICROSCOPIC  MAGNESIUM  PHOSPHORUS    EKG None  Radiology No results found.  Procedures Procedures (including critical care time)  Medications Ordered in ED Medications  nicotine (NICODERM CQ - dosed in mg/24 hours) patch 14 mg (14 mg Transdermal Patch Applied 05/01/19 1238)  LORazepam (ATIVAN) tablet 1-4 mg (has no administration in time range)    Or  LORazepam (ATIVAN) injection 1-4 mg (has no administration in time range)  thiamine (VITAMIN B-1) tablet 100 mg (100 mg Oral Given 05/01/19 1237)    Or  thiamine (B-1) injection 100 mg ( Intravenous See Alternative [id number])  folic acid (FOLVITE) tablet 1 mg (1 mg Oral Given 05/01/19 1237)  multivitamin with minerals  tablet 1 tablet (1 tablet Oral Given 05/01/19 1237)  sodium chloride 0.9 % bolus 1,000 mL (1,000 mLs Intravenous New Bag/Given 05/01/19 1239)     Initial Impression / Assessment and Plan / ED Course  I have reviewed the triage vital signs and the nursing notes.  Pertinent labs & imaging results that were available during my care of the patient were reviewed by me and considered in my medical decision making (see chart for details).  58 yo male here with abdominal pain, ongoing x 1 week, nausea.  No vomiting.  Clinically well appearing and in no acute distress.  Very low concern for acute surgical abdomen based on his presentation.  Mild elevation in lipase - likely some element of chronic pancreatitis here  Significant hyponatremia, suspect this is due to beer potomania (he reports he eats very little food).  No significant hyperglycemia.  Do not suspect diabetic ketosis at this time.  No evidence of confusion or seizures 2/2 hyponatremia to warrant ICU admission at this time.  Plan for hospital admission, CIWA protocol, IV NS bolus Nicotine patch (smokes 1-2 ppd daily)   Final Clinical Impressions(s) / ED Diagnoses   Final diagnoses:  Hyponatremia  Alcohol dependence with uncomplicated withdrawal ([place])  Epigastric pain    ED Discharge Orders    None       Wyvonnia Dusky, MD 05/01/19 1251

## 2019-05-01 NOTE — ED Triage Notes (Signed)
Pt arrives via EMS with reports of LUQ sharp pain for a week. hx of pancreatitis and ETOH abuse. Pt states he has had intermittent blood in his stool. Last had one beer this AM.

## 2019-05-01 NOTE — H&P (Addendum)
[facility] Admission History and Physical Service Pager: [phone]  Patient name: [name]. Medical record number: [id number] Date of birth: 05-Feb-1961 Age: 58 y.o. Gender: male  Primary Care Provider: Alvester Chou, NP Consultants:  Code Status: Full codes Preferred Emergency Contact: Rogene Houston [phone]  Chief Complaint: Abdominal pain  Assessment and Plan: [name]. is a 58 y.o. male presenting with abdominal pain. PMH is significant for alcohol use, hx of pancreatitis, tobacco use, hx of empyema (VATS 11/2018), and chronic hyponatremia.   Abdominal pain  Nausea  Vomiting Patient reports 3 to 4 days of abdominal pain.  He states that he is also had worsening chills and diaphoresis.  Patient also had nausea and nonbloody vomiting.  Patient reports that the pain is right in the middle of his stomach (epigastric region) and feels like the pain felt when he had pancreatitis "a few years ago". Patient denies any chest pain.  Reports mild shortness of breath.  Patient reports he did not take anything for this abdominal pain.  Lab work on admission showed sodium 117, K 4.9, lipase 87, total bili 1.5, WBC 11.3, and Hgb 12.6. EKG showed normal sinus rhythm with prolonged QT of 503.  Corrected QTC was 444.  Differential includes acute pancreatitis vs viral gastritis vs cholecystitis vs peptic ulcer.  Given patient's history of alcoholism as well as pancreatitis feel like this is most likely an episode of pancreatitis. Could also consider his diaphoresis and nausea/vomiting to acute alcohol withdrawal, however patient's last alcoholic drink was 9am today (about 5 hours ago). -Admit to inpatient teaching service with Dr. Ardelia Mems is attending -PT/INR ordered -NPO for pancreatitis -IV Protonix 40 mg IV at bedtime -IV Zofran 4 mg every 6 as needed -Morphine 1 mg given once -Vitals per routine  -IV maintenance fluids with D5 normal saline for 5  hours and then reassess  -Daily CBCs -Daily CMP's  Hyponatremia Patient's sodium on admission was 117.  Repeat sodium after about 500cc of fluid is 118.  He was started on a liter bolus of normal saline in the ED, however kept bending his elbow where his IV was placed which slowed down the infusion rate. -Repeat BMP every 4 hours -IV maintenance fluids at 100 mL an hour with D5 normal saline for first 5 hours of admission; then night team can re-evaluate IV fluids - Monitor for signs and symptoms of worsening hyponatremia  Hypertension Blood pressures have been elevated since admission ranging from 141/91-191/92.  Patient takes 50 mg metoprolol twice daily. -Continue home metoprolol 50 mg daily -Hydralazine 10 mg three times daily - Monitor BP frequently  EtOH abuse Patient reports he drinks approximately 12 beers daily.  He says that this is his normal amount.  Reports that he has never had withdrawal or DTs, however he reports he feels that he's going into withdrawal now. The patient has history of a hospitalization in which he was treated in the ICU for alcohol withdrawal.  Reports that most recent beer was at 9 AM this morning. CIWA in the ED recorded at 13. -CIWA protocol - Ativan on board for CIWAs >5. Please follow prompts for Ativan administration.   Bright red blood per rectum Patient reports he has had occasional bright red blood per rectum over the past week.  He said he has never experienced this before.  Patient denies any history of hemorrhoids that he knows of.  Denies any dark or tarry stools.  Patient is asymptomatic  for signs of anemia.  Admission CBC showed hemoglobin of 12.6.  Patient reports he has never had a colonoscopy. -Will need rectal exam -Consider GI consult  - Hold lovenox or other DVT prophylaxis with concern for acute bleed  Tobacco use Smokes 1-1.5 packs per day -Offered smoking cessation counseling as well as nicotine patches -Patient has nicotine patch  in place  FEN/GI: N.p.o. Prophylaxis: Lovenox  Disposition: Admission to inpatient teaching service  History of Present Illness:  Semaje L [place] Pugh. is a 58 y.o. male presenting with abdominal pain for approximately 3 to 4 days.  He thinks he's going through withdrawals or has pancreatic issues like he did in the past.. He drinks a 12-pack daily, last beer was this morning around 8am. He's been vomiting, had nausea.  No diarrhea or constipation. Not peeing more than normal.  Patient has had a slight cough for approximately 2 days and is now having cold chills.  Denies any fevers. Having a little shortness of breath. No swelling in his feet or hands. He has not taken anything for stomach pain. He's had pancreatitis once in the past, about 3-4 years ago. He has not been eating much recently. He lives with his girlfriend. No sick contacts, no travel.  Patient reports that he has never gone through withdrawals in the past but he just feels like this might be was going on.  Per chart review patient has had multiple hospital admissions for alcohol abuse/alcohol withdrawals with the most recent being in 2018 which required admission to the ICU and Pre[facility].    Has also had some bright red blood in his poop on and off for the past week. Has never had a colonoscopy. Denies weight loss.   Review Of Systems: Per HPI with the following additions:   Review of Systems  Constitutional: Positive for chills and diaphoresis. Negative for fever and weight loss.  HENT: Negative for congestion and sinus pain.   Eyes: Negative for blurred vision.  Respiratory: Positive for cough and shortness of breath. Negative for hemoptysis and sputum production.   Cardiovascular: Negative for chest pain and palpitations.  Gastrointestinal: Positive for abdominal pain, blood in stool (Intermittently bright red blood over the last week.  Patient denies hemorrhoids), nausea and vomiting. Negative for constipation, diarrhea and  heartburn.  Genitourinary: Negative for dysuria, frequency, hematuria and urgency.  Musculoskeletal: Negative for neck pain.  Skin: Negative for rash.  Neurological: Negative for tremors, seizures, weakness and headaches.  Endo/Heme/Allergies: Does not bruise/bleed easily.  Psychiatric/Behavioral: Positive for substance abuse (12 beers a day).    Patient Active Problem List   Diagnosis Date Noted  . Hyponatremia 05/01/2019  . CAP (community acquired pneumonia) 11/26/2018  . Empyema of right pleural space ([place]) 11/26/2018  . Empyema, right ([place]) 11/26/2018  . Alcohol abuse   . Tobacco abuse   . Poor dentition   . Radiculopathy due to lumbar intervertebral disc disorder 12/29/2017  . Chronic left-sided low back pain with left-sided sciatica 11/30/2017  . 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 Alcohol abuse   . Alcoholic ([place])   . Back pain   . CAP (community acquired pneumonia) 11/26/2018  . COPD (chronic obstructive pulmonary disease) ([place])   . Empyema of right pleural space ([place]) 11/26/2018  . Heavy smoker   . Hep C w/ coma, chronic ([place])   . Loose, teeth   . Pancreatitis   .  Poor dentition   . Tobacco abuse     Past Surgical History: Past Surgical History:  Procedure Laterality Date  . ARTERY REPAIR  2013   neck  . BIOPSY  02/19/2019   Procedure: BIOPSY;  Surgeon: Ronnette Juniper, MD;  Location: [facility] ENDOSCOPY;  Service: Gastroenterology;;  . ESOPHAGOGASTRODUODENOSCOPY (EGD) WITH PROPOFOL N/A 02/19/2019   Procedure: ESOPHAGOGASTRODUODENOSCOPY (EGD) WITH PROPOFOL;  Surgeon: Ronnette Juniper, MD;  Location: [facility];  Service: Gastroenterology;  Laterality: N/A;  . FOREIGN BODY REMOVAL  02/19/2019   Procedure: FOREIGN BODY REMOVAL;  Surgeon: Ronnette Juniper, MD;  Location: [facility] ENDOSCOPY;  Service: Gastroenterology;;  . KNEE SURGERY  2013   rt  . MICROLARYNGOSCOPY N/A  07/31/2014   Procedure: MICROLARYNGOSCOPY WITH BIOPSY ;  Surgeon: Rozetta Nunnery, MD;  Location: [facility];  Service: ENT;  Laterality: N/A;  . NECK SURGERY  2013   cervical fusion-  . VASCULAR SURGERY    . VIDEO ASSISTED THORACOSCOPY (VATS)/EMPYEMA Right 11/26/2018   Procedure: VIDEO ASSISTED THORACOSCOPY (VATS)/EMPYEMA;  Surgeon: Rexene Alberts, MD;  Location: [facility];  Service: Thoracic;  Laterality: Right;  [name] VIDEO BRONCHOSCOPY  11/26/2018   Procedure: Video Bronchoscopy;  Surgeon: Rexene Alberts, MD;  Location: [facility] OR;  Service: Thoracic;;    Social History: Social History   Tobacco Use  . Smoking status: Current Every Day Smoker    Packs/day: 2.00    Types: Cigarettes  . Smokeless tobacco: Never Used  Substance Use Topics  . Alcohol use: Yes    Comment: alcoholic (4 123XX123 a day). 12 pck a day  . Drug use: No   Smokes 1 pack - 1.5 packs daily Declines other drug use  Additional social history:  Please also refer to relevant sections of EMR.  Family History: No family history on file. Patient denies any relevant family medical history  Allergies and Medications: Allergies  Allergen Reactions  . Penicillins Other (See Comments)    Tolerated Zosyn 11/2018 Has patient had a PCN reaction causing immediate rash, facial/tongue/throat swelling, SOB or lightheadedness with hypotension: childhood allergy Has patient had a PCN reaction causing severe rash involving mucus membranes or skin necrosis: childhood allergy Has patient had a PCN reaction that required hospitalization childhood allergy Has patient had a PCN reaction occurring within the last 10 years: childhood allergy If all of the above answers are "NO", then may proceed wit   No current facility-administered medications on file prior to encounter.    Current Outpatient Medications on File Prior to Encounter  Medication Sig Dispense Refill  . Aspirin-Acetaminophen-Caffeine (GOODYS EXTRA STRENGTH)  500-325-65 MG PACK Take 1 Package by mouth 4 (four) times daily as needed (pain).     [name] ibuprofen (ADVIL) 200 MG tablet Take 400 mg by mouth every 6 (six) hours as needed for moderate pain.    . metoprolol tartrate (LOPRESSOR) 50 MG tablet Take 50 mg by mouth 2 (two) times daily.    . pantoprazole (PROTONIX) 40 MG tablet Take 1 tablet (40 mg total) by mouth 2 (two) times daily. 180 tablet 3  . metoprolol tartrate (LOPRESSOR) 25 MG tablet Take 1 tablet (25 mg total) by mouth 2 (two) times daily. (Patient not taking: Reported on 04/12/2019) 60 tablet 2  . oxyCODONE-acetaminophen (PERCOCET/ROXICET) 5-325 MG tablet Take 1 tablet by mouth every 6 (six) hours as needed for severe pain. (Patient not taking: Reported on 03/22/2019) 20 tablet 0    Objective: BP (!) 181/90   Pulse 73  Temp 98 F (36.7 C) (Oral)   Resp (!) 27   Ht 5\' 8"  (1.727 m)   Wt 64.5 kg   SpO2 99%   BMI 21.62 kg/m  Physical Exam  Constitutional: He is oriented to person, place, and time. He appears distressed.  HENT:  Head: Normocephalic and atraumatic.  Mouth/Throat: No oropharyngeal exudate.  Eyes: Pupils are equal, round, and reactive to light. Conjunctivae and EOM are normal.  Neck: Normal range of motion. Neck supple.  Cardiovascular: Normal rate and regular rhythm. Exam reveals no gallop and no friction rub.  No murmur heard. Pulmonary/Chest: He is in respiratory distress. He has no wheezes. He has no rales. He exhibits no tenderness.  Abdominal: Soft. Bowel sounds are normal. There is abdominal tenderness (Epigastric tenderness).  Musculoskeletal:        General: No deformity or edema.  Lymphadenopathy:    He has no cervical adenopathy.  Neurological: He is alert and oriented to person, place, and time. No cranial nerve deficit. Coordination normal.  Skin: Skin is warm. He is diaphoretic.  Patient is extremely diaphoretic  Psychiatric: Affect normal.    Labs and Imaging: CBC BMET  Recent Labs  Lab  05/01/19 1054  WBC 11.3*  HGB 12.6*  HCT 40.1  PLT 519*   Recent Labs  Lab 05/01/19 1510  NA 118*  K 4.4  CL 82*  CO2 22  BUN 13  CREATININE 0.72  GLUCOSE 131*  CALCIUM 8.7*     EKG: Sinus rhythm with left atrial enlargement  UA-pH 7.0, protein-100, specific gravity 1.011  Gifford Shave, MD 05/01/2019, 4:01 PM PGY-1, [place] Intern pager: [phone], text pages welcome  FPTS Upper-Level Resident Addendum I have independently interviewed and examined the patient. I have discussed the above with the original author and agree with their documentation. My edits for correction/addition/clarification are in blue. Please see also any attending notes.    Milus Banister, DO PGY-2, [facility] 05/01/2019 9:27 PM  [facility] Service pager: [phone] (text pages welcome through [place])

## 2019-05-01 NOTE — Discharge Summary (Addendum)
[facility] Discharge Summary  Patient name: [name]. Medical record number: [id number] Date of birth: 08/12/1960 Age: 58 y.o. Gender: male Date of Admission: 05/01/2019  Date of Discharge: 05/05/2019 Admitting Physician: Leeanne Rio, MD  Primary Care Provider: Alvester Chou, NP Consultants: none  Indication for Hospitalization: Hyponatremia and acute pancreatitis  Discharge Diagnoses/Problem List:  Hyponatremia Abdominal pain with nausea and vomiting ?Pancreatitis, unconfirmed diagnosis Alcohol Use Disorder, chronic Hypertension BRBPR with external hemorrhoids Tobacco use disorder  Disposition: Home   Discharge Condition: Stable  Discharge Exam:  Physical Exam: General: NAD, pleasant Cardiovascular: RRR, no m/r/g, no LE edema Respiratory: CTA BL, normal work of breathing MSK: moves 4 extremities equally Derm: no rashes appreciated Neuro: CN II-XII grossly intact Psych: AOx3, appropriate affect  Brief Hospital Course:  Patient presented 05/01/2019 for abdominal pain, nausea, vomiting.  He reported that he drinks a 12 pack of beer a day with his last drink being at 9 AM that morning.  His initial lab work showed a sodium of 117, most likely due to beer potomania.  He was started on Normal Saline IVF in the emergency department and admitted to the inpatient service.  On 11/24 patient's sodium had trended up to 126.  He was placed on CIWA protocol and received occasional doses of Ativan. CIWA score remained 13 and below during his admission. His epigastric pain, nausea, and vomiting were treated with Zofran and morphine (which was later switched to Oxycodone 5mg  q6 PRN). His symptoms resided and thus Zofran and Oxycodone were no longer needed to keep him comfortable. By this time his sodium had improved to 127.   By 05/04/2019 the patient was feeling much better and requesting to go home. His sodium was 128 on morning labs.  He did not  require any Ativan overnight. We recommended him staying for the afternoon and we would recheck his sodium, which was 126 on recheck. He also called out for abdominal pain and requested Ativan. He was given 1 mg Ativan.  Patient's hyponatremia on day of discharge was improved with a sodium of 131.  He was tolerating p.o. intake well and requesting discharge.  Patient requested assistance with EtOH abstinence. He was discharged with 300 mg Gabapentin in the morning and at lunch, and 600 mg at night.   Issues for Follow Up:  1. Referral made to GI for colonoscopy for chronic Anemia as well as for evaluation and treatment for chronic Hepatitis C that has never been treated.  2. Please provide patient with resources for EtOH cessation.  3. Patient started on losartan for blood pressure while admitted. 4. Given patient's smoking history recommend low-dose CT chest 5. To treat anemia, patient prescribed Iron supplements (Ferrous sulfate) 325mg  every other day indefinitely. 6. Patient's EKG showed short run of "ventricular tachycardia" - we recommend an echocardiogram to evaluate for any heart or valvular disease. 7. Patient prescribed Gabapentin 300mg  in the morning, 300mg  at lunch, and 600mg  at night to help decrease dependence on alcohol.  Significant Procedures: none  Significant Labs and Imaging:  Recent Labs  Lab 05/03/19 0219 05/04/19 0149 05/05/19 0420  WBC 12.2* 12.0* 10.1  HGB 10.3* 9.7* 10.0*  HCT 33.0* 32.6* 34.4*  PLT 394 334 402*   Recent Labs  Lab 05/01/19 1054  05/03/19 0219 05/03/19 1922 05/04/19 0149 05/04/19 1351 05/05/19 0420  NA 117*   < > 127* 126* 128* 126* 131*  K 4.9   < > 4.1 4.2 4.7 4.3  4.5  CL 81*   < > 96* 97* 99 97* 96*  CO2 22   < > 22 19* 19* 22 25  GLUCOSE 102*   < > 105* 103* 98 103* 93  BUN 13   < > 13 11 10 9 8   CREATININE 0.69   < > 0.65 0.65 0.57* 0.63 0.61  CALCIUM 8.8*   < > 8.7* 8.4* 8.6* 8.5* 8.9  MG 2.0  --   --   --   --   --  2.0  PHOS  4.1  --   --   --   --   --   --   ALKPHOS 48  --   --   --   --   --   --   AST 18  --   --   --   --   --   --   ALT 13  --   --   --   --   --   --   ALBUMIN 3.4*  --   --   --   --   --   --    < > = values in this interval not displayed.   Hep A nonreactive Hep B nonreactive Hep C reactive HIV nonreactive   Results/Tests Pending at Time of Discharge: None  Discharge Medications:  Allergies as of 05/05/2019      Reactions   Penicillins Other (See Comments)   Tolerated Zosyn 11/2018 Has patient had a PCN reaction causing immediate rash, facial/tongue/throat swelling, SOB or lightheadedness with hypotension: childhood allergy Has patient had a PCN reaction causing severe rash involving mucus membranes or skin necrosis: childhood allergy Has patient had a PCN reaction that required hospitalization childhood allergy Has patient had a PCN reaction occurring within the last 10 years: childhood allergy If all of the above answers are "NO", then may proceed wit      Medication List    STOP taking these medications   Goodys Extra Strength 500-325-65 MG Pack Generic drug: Aspirin-Acetaminophen-Caffeine   ibuprofen 200 MG tablet Commonly known as: ADVIL   oxyCODONE-acetaminophen 5-325 MG tablet Commonly known as: PERCOCET/ROXICET     TAKE these medications   ferrous sulfate 325 (65 FE) MG tablet Take 1 tablet (325 mg total) by mouth every other day.   gabapentin 300 MG capsule Commonly known as: Neurontin Take 1 tablet (300mg ) each morning and at lunch, take 2 tablets (600mg ) at night.   losartan 25 MG tablet Commonly known as: COZAAR Take 1 tablet (25 mg total) by mouth daily.   metoprolol tartrate 50 MG tablet Commonly known as: LOPRESSOR Take 50 mg by mouth 2 (two) times daily. What changed: Another medication with the same name was removed. Continue taking this medication, and follow the directions you see here.   pantoprazole 40 MG tablet Commonly known as:  Protonix Take 1 tablet (40 mg total) by mouth 2 (two) times daily.       Discharge Instructions: Please refer to Patient Instructions section of EMR for full details.  Patient was counseled important signs and symptoms that should prompt return to medical care, changes in medications, dietary instructions, activity restrictions, and follow up appointments.    Gifford Shave, MD 05/06/2019, 12:18 PM PGY-1, [place] Upper-Level Resident Addendum I have independently interviewed and examined the patient. I have discussed the above with the original author and agree with their documentation. My edits for correction/addition/clarification are  in blue. Please see also any attending notes.    Milus Banister, DO PGY-2, [facility] 05/08/2019 9:08 AM  FPTS Service pager: [phone] (text pages welcome through [facility])

## 2019-05-02 LAB — CBC
HCT: 37.1 % — ABNORMAL LOW (ref 39.0–52.0)
Hemoglobin: 11.9 g/dL — ABNORMAL LOW (ref 13.0–17.0)
MCH: 23.5 pg — ABNORMAL LOW (ref 26.0–34.0)
MCHC: 32.1 g/dL (ref 30.0–36.0)
MCV: 73.2 fL — ABNORMAL LOW (ref 80.0–100.0)
Platelets: 393 10*3/uL (ref 150–400)
RBC: 5.07 MIL/uL (ref 4.22–5.81)
RDW: 19.7 % — ABNORMAL HIGH (ref 11.5–15.5)
WBC: 13.3 10*3/uL — ABNORMAL HIGH (ref 4.0–10.5)
nRBC: 0.2 % (ref 0.0–0.2)

## 2019-05-02 LAB — BASIC METABOLIC PANEL
Anion gap: 11 (ref 5–15)
Anion gap: 10 (ref 5–15)
BUN: 16 mg/dL (ref 6–20)
BUN: 16 mg/dL (ref 6–20)
CO2: 23 mmol/L (ref 22–32)
CO2: 23 mmol/L (ref 22–32)
Calcium: 8.9 mg/dL (ref 8.9–10.3)
Calcium: 8.8 mg/dL — ABNORMAL LOW (ref 8.9–10.3)
Chloride: 91 mmol/L — ABNORMAL LOW (ref 98–111)
Chloride: 93 mmol/L — ABNORMAL LOW (ref 98–111)
Creatinine, Ser: 0.64 mg/dL (ref 0.61–1.24)
Creatinine, Ser: 0.65 mg/dL (ref 0.61–1.24)
GFR calc Af Amer: >60 mL/min (ref >60)
GFR calc Af Amer: >60 mL/min (ref >60)
GFR calc non Af Amer: >60 mL/min (ref >60)
GFR calc non Af Amer: >60 mL/min (ref >60)
Glucose, Bld: 115 mg/dL — ABNORMAL HIGH (ref 70–99)
Glucose, Bld: 97 mg/dL (ref 70–99)
Potassium: 4.4 mmol/L (ref 3.5–5.1)
Potassium: 4.1 mmol/L (ref 3.5–5.1)
Sodium: 125 mmol/L — ABNORMAL LOW (ref 135–145)
Sodium: 126 mmol/L — ABNORMAL LOW (ref 135–145)

## 2019-05-02 MED ORDER — SODIUM CHLORIDE 0.9 % IV SOLN
INTRAVENOUS | Status: DC
  Administered 2019-05-02 – 2019-05-03 (×3): via INTRAVENOUS

## 2019-05-02 MED ORDER — METOPROLOL TARTRATE 50 MG PO TABS
50.0000 mg | ORAL_TABLET | Freq: Two times a day (BID) | ORAL | Status: DC
  Administered 2019-05-02 – 2019-05-05 (×7): 50 mg via ORAL
  Filled 2019-05-02 (×7): qty 1

## 2019-05-02 NOTE — Progress Notes (Signed)
Family Medicine Teaching Service Daily Progress Note Intern Pager: [phone]  Patient name: [name]. Medical record number: [id number] Date of birth: [birth year] Age: 58 y.o. Gender: male  Primary Care Provider: Alvester Chou, NP Consultants: None at this time Code Status: Full code  Pt Overview and Major Events to Date:  05/01/2019-patient admitted for hyponatremia  Assessment and Plan: [name]. is a 58 y.o. male presenting with abdominal pain. PMH is significant for alcohol use, hx of pancreatitis, tobacco use, hx of empyema (VATS 11/2018), and chronic hyponatremia.   Abdominal pain/nausea/vomiting Patient admitted with a chief complaint of abdominal pain and nausea and vomiting.  He was also diaphoretic.  He was evaluated in the emergency room and found to have epigastric pain, none bloody vomiting, a sodium of 117, lipase 87, T bili 1.5, WBC 11.3.  Patient has a history of pancreatitis and reportedly felt like that was what was happening again. -N.p.o. for pancreatitis -IV Protonix-40 mg IV at bedtime -IV Zofran 4 mg every 6 hours as needed -Morphine 1 mg every 2 hours as needed -Vitals per routine -IV maintenance fluids with D5 normal saline -Daily CBCs -Daily CMP  Hyponatremia Patient's sodium admission was 117.  He was given approximately half a liter of fluid and it was trended to 118.  After admission he was started on maintenance fluids with D5 normal saline has signs were continue to trend with most recent being 119. -Repeat BMP every 4 hours -IVF maintenance fluid 100 mL an hour with D5 normal saline -Monitor for signs and symptoms of worsening hyponatremia  Hypertension Blood pressure this morning 150/86.  Blood pressure range yesterday was 141/91-191/92.  Home medication includes 50 mg metoprolol twice daily. -Continue home metoprolol 50 mg twice daily -Hydralazine 10 mg 3 times daily -Monitored BP frequently  EtOH abuse Patient reports drinking  approximately 12 beers a day.  On admission he reported he felt like he was going into DTs although he said he had never been through them before.  Per chart review patient was hospitalized in the ICU in 2018 for alcohol withdrawal.  Patient's last alcoholic drink was 9 AM on 05/01/2019.  CIWA in the ED recorded at 73.  Overnight CIWA's were 7, 5, 0, 0.  Patient received 4 mg of Ativan overnight and 1 mg this morning. -CIWA protocol -Ativan on board for CYS greater than 5.  Please follow prompts for Ativan administration  Bright red blood per rectum Patient reports he has had bleeding per rectum off and on for the past week or so.  He reports he has never had issues with this in the past.  Denies any dark tarry stools.  Patient is asymptomatic for signs of anemia.  Admission CBC showed hemoglobin of 12.6.  Patient reports he is never had a colonoscopy rectal exam showed multiple external hemorrhoids with none actively bleeding.  Patient reports he had a bowel movement today 11/24 and there was no bright red blood. -Monitor CBCs -Monitor bowel movements -Consider GI consult   Tobacco use Patient smokes 1-1.5 packs/day -Offered smoking cessation as well as nicotine patches -Patient has nicotine patch in place  FEN/GI: N.p.o. PPx: SCDs  Disposition: Pending further evaluation  Subjective:  Patient is doing well this morning.  Reports he feels "much better" when compared to yesterday.  Denies any headaches, shortness of breath, nausea, vomiting, diarrhea, constipation, blood in stool. Objective: Temp:  [98 F (36.7 C)-98.4 F (36.9 C)] 98.4 F (36.9 C) (11/24 0528)  Pulse Rate:  [73-109] 100 (11/24 0528) Resp:  [16-29] 18 (11/24 0528) BP: (141-191)/(82-120) 150/86 (11/24 0528) SpO2:  [98 %-100 %] 99 % (11/24 0528) Weight:  [64.5 kg-66.2 kg] 66.2 kg (11/23 1956) Physical Exam: General: NAD, resting comfortably in bed HEENT: Atraumatic. Normocephalic.  Neck: No cervical lymphadenopathy.   Cardiac: RRR, no m/r/g Respiratory: CTAB, normal work of breathing Abdomen: soft, nontender, nondistended, bowel sounds normal GI: Rectal exam done showing external hemorrhoids with mild irritation, no active bleeding noted. Skin: warm and dry, no rashes noted Neuro: alert and oriented  Laboratory: Recent Labs  Lab 05/01/19 1054  WBC 11.3*  HGB 12.6*  HCT 40.1  PLT 519*   Recent Labs  Lab 05/01/19 1054 05/01/19 1510 05/01/19 2105  NA 117* 118* 119*  K 4.9 4.4 4.5  CL 81* 82* 84*  CO2 22 22 23   BUN 13 13 15   CREATININE 0.69 0.72 0.59*  CALCIUM 8.8* 8.7* 9.0  PROT 7.0  --   --   BILITOT 1.5*  --   --   ALKPHOS 48  --   --   ALT 13  --   --   AST 18  --   --   GLUCOSE 102* 131* 107*    Imaging/Diagnostic Tests: No results found.   Gifford Shave, MD 05/02/2019, 9:21 AM PGY-1, [place] Intern pager: [phone], text pages welcome\

## 2019-05-02 NOTE — Plan of Care (Signed)

## 2019-05-02 NOTE — Progress Notes (Addendum)
Paged MD about patient's HR sustaining in the 140's. 12-lead EKG ordered. PRN ativan and morphine administered. Will continue to monitor.  1510 Paged MD to confirm EKG is completed.

## 2019-05-02 NOTE — Progress Notes (Signed)
Confirmed with lab this morning she has drawn morning BMP.

## 2019-05-02 NOTE — Progress Notes (Signed)
CRITICAL VALUE ALERT  Critical Value:  Sodium 119  Date & Time Notied:  05/01/2019 @ 2150  Provider Notified: Chauncey Reading, MD @ 2152  Orders Received/Actions taken: Renewed D5NS @ 100

## 2019-05-03 ENCOUNTER — Encounter (HOSPITAL_COMMUNITY): Payer: Self-pay | Admitting: General Practice

## 2019-05-03 LAB — BASIC METABOLIC PANEL
Anion gap: 9 (ref 5–15)
Anion gap: 10 (ref 5–15)
BUN: 13 mg/dL (ref 6–20)
BUN: 11 mg/dL (ref 6–20)
CO2: 22 mmol/L (ref 22–32)
CO2: 19 mmol/L — ABNORMAL LOW (ref 22–32)
Calcium: 8.7 mg/dL — ABNORMAL LOW (ref 8.9–10.3)
Calcium: 8.4 mg/dL — ABNORMAL LOW (ref 8.9–10.3)
Chloride: 96 mmol/L — ABNORMAL LOW (ref 98–111)
Chloride: 97 mmol/L — ABNORMAL LOW (ref 98–111)
Creatinine, Ser: 0.65 mg/dL (ref 0.61–1.24)
Creatinine, Ser: 0.65 mg/dL (ref 0.61–1.24)
GFR calc Af Amer: >60 mL/min (ref >60)
GFR calc Af Amer: >60 mL/min (ref >60)
GFR calc non Af Amer: >60 mL/min (ref >60)
GFR calc non Af Amer: >60 mL/min (ref >60)
Glucose, Bld: 105 mg/dL — ABNORMAL HIGH (ref 70–99)
Glucose, Bld: 103 mg/dL — ABNORMAL HIGH (ref 70–99)
Potassium: 4.1 mmol/L (ref 3.5–5.1)
Potassium: 4.2 mmol/L (ref 3.5–5.1)
Sodium: 127 mmol/L — ABNORMAL LOW (ref 135–145)
Sodium: 126 mmol/L — ABNORMAL LOW (ref 135–145)

## 2019-05-03 LAB — CBC
HCT: 33 % — ABNORMAL LOW (ref 39.0–52.0)
Hemoglobin: 10.3 g/dL — ABNORMAL LOW (ref 13.0–17.0)
MCH: 23.3 pg — ABNORMAL LOW (ref 26.0–34.0)
MCHC: 31.2 g/dL (ref 30.0–36.0)
MCV: 74.5 fL — ABNORMAL LOW (ref 80.0–100.0)
Platelets: 394 10*3/uL (ref 150–400)
RBC: 4.43 MIL/uL (ref 4.22–5.81)
RDW: 19.7 % — ABNORMAL HIGH (ref 11.5–15.5)
WBC: 12.2 10*3/uL — ABNORMAL HIGH (ref 4.0–10.5)
nRBC: 0 % (ref 0.0–0.2)

## 2019-05-03 MED ORDER — OXYCODONE HCL 5 MG PO TABS
5.0000 mg | ORAL_TABLET | Freq: Four times a day (QID) | ORAL | Status: DC | PRN
  Administered 2019-05-03 – 2019-05-05 (×8): 5 mg via ORAL
  Filled 2019-05-03 (×9): qty 1

## 2019-05-03 MED ORDER — LOSARTAN POTASSIUM 25 MG PO TABS
25.0000 mg | ORAL_TABLET | Freq: Every day | ORAL | Status: DC
  Administered 2019-05-03 – 2019-05-05 (×3): 25 mg via ORAL
  Filled 2019-05-03 (×3): qty 1

## 2019-05-03 MED ORDER — MORPHINE SULFATE (PF) 2 MG/ML IV SOLN: 1.0000 mg | Freq: Four times a day (QID) | INTRAVENOUS | Status: DC | PRN

## 2019-05-03 MED ORDER — PANTOPRAZOLE SODIUM 40 MG PO TBEC
40.0000 mg | DELAYED_RELEASE_TABLET | Freq: Every day | ORAL | Status: DC
  Administered 2019-05-03 – 2019-05-04 (×2): 40 mg via ORAL
  Filled 2019-05-03 (×2): qty 1

## 2019-05-03 MED ORDER — ENOXAPARIN SODIUM 40 MG/0.4ML ~~LOC~~ SOLN
40.0000 mg | SUBCUTANEOUS | Status: DC
  Administered 2019-05-03 – 2019-05-05 (×3): 40 mg via SUBCUTANEOUS
  Filled 2019-05-03 (×3): qty 0.4

## 2019-05-03 NOTE — Progress Notes (Addendum)
Family Medicine Teaching Service Daily Progress Note Intern Pager: [phone]  Patient name: [name]. Medical record number: [id number] Date of birth: February 10, 1961 Age: 58 y.o. Gender: male  Primary Care Provider: Alvester Chou, NP Consultants: None at this time Code Status: Full code  Pt Overview and Major Events to Date:  05/01/2019-patient admitted for hyponatremia  Assessment and Plan: William L [place] Sack. is a 57 y.o. male presenting with abdominal pain. PMH is significant for alcohol use, hx of pancreatitis, tobacco use, hx of empyema (VATS 11/2018), and chronic hyponatremia.   Abdominal pain/nausea/vomiting Patient admitted with a chief complaint of abdominal pain and nausea and vomiting.  He was also diaphoretic.  He was evaluated in the emergency room and found to have epigastric pain, none bloody vomiting, a sodium of 117, lipase 87, T bili 1.5, WBC 11.3.  Patient has a history of pancreatitis and reportedly felt like that was what was happening again.  Abdominal pain is improving.  Still complaining of mild epigastric pain.  Patient received 4 mg of morphine overnight. -Advance to full liquid diet  -Protonix-40 mg p.o. at bedtime -IV Zofran 4 mg every 6 hours as needed -Oxycodone IR 5 mg every 6 hours as needed -Vitals per routine -IV fluids with D5 normal saline at 50 mL/hr  -Daily CBCs -Daily CMP  Hyponatremia Patient's sodium admission was 117.  He was given approximately half a liter of fluid and it was trended to 118.  After admission he was started on maintenance fluids with D5 normal saline has signs were continue to trend with most recent being 127 -Twice daily -IVF maintenance fluid 50 mL an hour with D5 normal saline -Monitor for signs and symptoms of worsening hyponatremia  Hypertension Blood pressure this morning 171/91.  Blood pressure range yesterday was 140/81-184/90 home medication includes 50 mg metoprolol twice daily. -Continue metoprolol 50 mg  twice daily -Add losartan 25 mg daily -Monitored BP frequently  EtOH abuse Patient reports drinking approximately 12 beers a day.  On admission he reported he felt like he was going into DTs although he said he had never been through them before.  Per chart review patient was hospitalized in the ICU in 2018 for alcohol withdrawal.  Patient's last alcoholic drink was 9 AM on 05/01/2019.  CIWA in the ED recorded at 105.  Overnight CIWA's were 5, 0,6, 0, 0, 0.  Patient received 2 mg mg of Ativan overnight. -CIWA protocol -Ativan on board for CIWAS greater than 5.  Please follow prompts for Ativan administration  Bright red blood per rectum Patient reports he has had bleeding per rectum off and on for the past week or so.  He reports he has never had issues with this in the past.  Denies any dark tarry stools.  Patient is asymptomatic for signs of anemia.  Admission CBC showed hemoglobin of 12.6.  Patient reports he is never had a colonoscopy rectal exam showed multiple external hemorrhoids with none actively bleeding.  Patient reports he had a bowel movement today 11/24 and there was no bright red blood. -Monitor CBCs -Monitor bowel movements -Consider GI consult   Tobacco use Patient smokes 1-1.5 packs/day -Offered smoking cessation as well as nicotine patches -Patient has nicotine patch in place  FEN/GI: N.p.o. PPx: SCDs  Disposition: Pending further evaluation  Subjective:  Patient reports he is feeling much better this morning.  Has minor epigastric pain.  Denies any shortness of breath or chest pain.  Objective: Temp:  Reina.Alexander F (  36.7 C)-98.6 F (37 C)] 98.3 F (36.8 C) (11/25 0637) Pulse Rate:  [72-141] 90 (11/25 0637) Resp:  [17-20] 18 (11/25 0637) BP: (140-184)/(81-99) 171/91 (11/25 0637) SpO2:  [99 %-100 %] 100 % (11/25 [id number]) Physical Exam: General: NAD, sleeping when I enter the room, easily arousable. HEENT: Atraumatic. Normocephalic.  Neck: No cervical lymphadenopathy.   Cardiac: RRR, no m/r/g Respiratory: CTAB, normal work of breathing Abdomen: soft, nontender, nondistended, bowel sounds normal GI: Rectal exam (05/02/2019) done showing external hemorrhoids with mild irritation, no active bleeding noted. Skin: warm and dry, no rashes noted Neuro: alert and oriented  Laboratory: Recent Labs  Lab 05/01/19 1054 05/02/19 1013 05/03/19 0219  WBC 11.3* 13.3* 12.2*  HGB 12.6* 11.9* 10.3*  HCT 40.1 37.1* 33.0*  PLT 519* 393 394   Recent Labs  Lab 05/01/19 1054  05/02/19 1013 05/02/19 1505 05/03/19 0219  NA 117*   < > 125* 126* 127*  K 4.9   < > 4.4 4.1 4.1  CL 81*   < > 91* 93* 96*  CO2 22   < > 23 23 22   BUN 13   < > 16 16 13   CREATININE 0.69   < > 0.64 0.65 0.65  CALCIUM 8.8*   < > 8.9 8.8* 8.7*  PROT 7.0  --   --   --   --   BILITOT 1.5*  --   --   --   --   ALKPHOS 48  --   --   --   --   ALT 13  --   --   --   --   AST 18  --   --   --   --   GLUCOSE 102*   < > 115* 97 105*   < > = values in this interval not displayed.    Imaging/Diagnostic Tests: No results found.   Gifford Shave, MD 05/03/2019, 9:43 AM PGY-1, [place] Intern pager: [phone], text pages welcome\

## 2019-05-03 NOTE — Plan of Care (Signed)

## 2019-05-04 DIAGNOSIS — F1010 Alcohol abuse, uncomplicated: Secondary | ICD-10-CM

## 2019-05-04 DIAGNOSIS — D509 Iron deficiency anemia, unspecified: Secondary | ICD-10-CM

## 2019-05-04 LAB — BASIC METABOLIC PANEL
Anion gap: 10 (ref 5–15)
Anion gap: 7 (ref 5–15)
BUN: 10 mg/dL (ref 6–20)
BUN: 9 mg/dL (ref 6–20)
CO2: 19 mmol/L — ABNORMAL LOW (ref 22–32)
CO2: 22 mmol/L (ref 22–32)
Calcium: 8.6 mg/dL — ABNORMAL LOW (ref 8.9–10.3)
Calcium: 8.5 mg/dL — ABNORMAL LOW (ref 8.9–10.3)
Chloride: 99 mmol/L (ref 98–111)
Chloride: 97 mmol/L — ABNORMAL LOW (ref 98–111)
Creatinine, Ser: 0.57 mg/dL — ABNORMAL LOW (ref 0.61–1.24)
Creatinine, Ser: 0.63 mg/dL (ref 0.61–1.24)
GFR calc Af Amer: >60 mL/min (ref >60)
GFR calc Af Amer: >60 mL/min (ref >60)
GFR calc non Af Amer: >60 mL/min (ref >60)
GFR calc non Af Amer: >60 mL/min (ref >60)
Glucose, Bld: 98 mg/dL (ref 70–99)
Glucose, Bld: 103 mg/dL — ABNORMAL HIGH (ref 70–99)
Potassium: 4.7 mmol/L (ref 3.5–5.1)
Potassium: 4.3 mmol/L (ref 3.5–5.1)
Sodium: 128 mmol/L — ABNORMAL LOW (ref 135–145)
Sodium: 126 mmol/L — ABNORMAL LOW (ref 135–145)

## 2019-05-04 LAB — CBC
HCT: 32.6 % — ABNORMAL LOW (ref 39.0–52.0)
Hemoglobin: 9.7 g/dL — ABNORMAL LOW (ref 13.0–17.0)
MCH: 23.1 pg — ABNORMAL LOW (ref 26.0–34.0)
MCHC: 29.8 g/dL — ABNORMAL LOW (ref 30.0–36.0)
MCV: 77.6 fL — ABNORMAL LOW (ref 80.0–100.0)
Platelets: 334 10*3/uL (ref 150–400)
RBC: 4.2 MIL/uL — ABNORMAL LOW (ref 4.22–5.81)
RDW: 20 % — ABNORMAL HIGH (ref 11.5–15.5)
WBC: 12 10*3/uL — ABNORMAL HIGH (ref 4.0–10.5)
nRBC: 0 % (ref 0.0–0.2)

## 2019-05-04 LAB — HIV ANTIBODY (ROUTINE TESTING W REFLEX): HIV Screen 4th Generation wRfx: NONREACTIVE

## 2019-05-04 LAB — HEPATITIS PANEL, ACUTE
HCV Ab: REACTIVE — AB
Hep A IgM: NONREACTIVE
Hep B C IgM: NONREACTIVE
Hepatitis B Surface Ag: NONREACTIVE

## 2019-05-04 MED ORDER — LORAZEPAM 1 MG PO TABS
1.0000 mg | ORAL_TABLET | Freq: Once | ORAL | Status: AC | PRN
  Administered 2019-05-04: 1 mg via ORAL

## 2019-05-04 MED ORDER — LOPERAMIDE HCL 2 MG PO CAPS
2.0000 mg | ORAL_CAPSULE | Freq: Two times a day (BID) | ORAL | Status: DC | PRN
  Administered 2019-05-04: 2 mg via ORAL
  Filled 2019-05-04: qty 1

## 2019-05-04 MED ORDER — ACETAMINOPHEN 325 MG PO TABS
650.0000 mg | ORAL_TABLET | Freq: Four times a day (QID) | ORAL | Status: DC | PRN
  Administered 2019-05-04 – 2019-05-05 (×2): 650 mg via ORAL
  Filled 2019-05-04 (×2): qty 2

## 2019-05-04 MED ORDER — LORAZEPAM 1 MG PO TABS: 1.0000 mg | ORAL_TABLET | Freq: Once | ORAL | Status: DC

## 2019-05-04 NOTE — Progress Notes (Signed)
Pt had eight beat run of v tach. Pt alert and oriented. Denies pain. Telemetry order has expired. Paged Dr. Renold Genta to notify him of pt's V tach and need to renew Telemetry.

## 2019-05-04 NOTE — Progress Notes (Signed)
Family Medicine Teaching Service Daily Progress Note Intern Pager: [phone]  Patient name: [name]. Medical record number: [id number] Date of birth: 12/24/1960 Age: 58 y.o. Gender: male  Primary Care Provider: Alvester Chou, NP Consultants: None at this time Code Status: Full code  Pt Overview and Major Events to Date:  05/01/2019-patient admitted for hyponatremia  Assessment and Plan: [name]. is a 58 y.o. male presenting with abdominal pain. PMH is significant for alcohol use, hx of  hepatitis C, pancreatitis, tobacco use, hx of empyema (VATS 11/2018), and chronic hyponatremia.   Hyponatremia, improving Patient's sodium admission was 117, now131.  Patient currently asymptomatic -Encourage p.o. intake -Monitor BMP  Microcytic Anemia: Admission CBC showed hemoglobin of 12.6, this am 10.0. EGD from 02/19/2019 showed LA Grade C esophagitis. Non-bleeding esophageal ulcers and Non-bleeding gastric ulcer with a clean ulcer base.  -Ferritin low at 13  Abdominal pain/nausea/vomiting, resolved Patient denies any abdominal pain, nausea or vomiting this morning.  He received 2 doses of OxyIR overnight. -Obtain RUQ ultrasound -Advance to full liquid diet  -Protonix-40 mg p.o. at bedtime -Oxycodone IR 5 mg every 6 hours as needed  H/o hepatitis C Appears on patient problem list in 2013.  Patient reports that he has never been treated and has never had a follow-up with a doctor. -f/u HCV quantitative level -Ensure that patient has follow-up outpatient  Hypertension, stable Blood pressure this morning 152/82.  Home medication includes 50 mg metoprolol twice daily. -Continue metoprolol 50 mg twice daily -Continue losartan 25 mg daily -Monitored BP frequently  EtOH abuse Patient reports drinking approximately 12 beers a day.  Previously has been hospitalized in the ICU in 2018 for alcohol withdrawal.  Patient's last alcoholic drink was 9 AM on 05/01/2019. Overnight  CIWA's 0.  Patient received 0 doses of Ativan overnight.  Patient is interested in cessation help after discharge. -CIWA protocol -Encourage cessation outpatient and provide with resources -Consider discharging on gabapentin 300 mg in the morning and afternoon and 600 mg at night as patient has previously tried with some benefit  BRBPR Patient reports he has had bleeding per rectum off and on for the past week or so. Patient is asymptomatic for signs of anemia.  Admission CBC showed hemoglobin of 12.6, this am 10.0.  Patient reports he is never had a colonoscopy. Rectal exam showed multiple external hemorrhoids with none actively bleeding.  -Monitor CBCs -Monitor bowel movements  Tobacco use disorder Patient smokes 1-1.5 packs/day. History of loculated pleural effusions, given smoking history, recommend follow up with low dose CT with PCP after discharge.  -Offered smoking cessation as well as nicotine patches -Patient has nicotine patch in place  FEN/GI: Regular diet PPx: Lovenox  Disposition: Stable for discharge  Subjective:  Patient reports that he is feeling quite well and would like to go home.  Objective: Temp:  [97.6 F (36.4 C)-99 F (37.2 C)] 97.6 F (36.4 C) (11/26 2038) Pulse Rate:  [71-90] 90 (11/26 2323) Resp:  [14-20] 20 (11/26 2323) BP: (145-166)/(78-95) 155/85 (11/26 2323) SpO2:  [99 %-100 %] 100 % (11/26 2323) Physical Exam: General: NAD, pleasant Cardiovascular: RRR, no m/r/g, no LE edema Respiratory: CTA BL, normal work of breathing MSK: moves 4 extremities equally Derm: no rashes appreciated Neuro: CN II-XII grossly intact Psych: AOx3, appropriate affect  Laboratory: Recent Labs  Lab 05/02/19 1013 05/03/19 0219 05/04/19 0149  WBC 13.3* 12.2* 12.0*  HGB 11.9* 10.3* 9.7*  HCT 37.1* 33.0* 32.6*  PLT 393 394  [place]  Lab 05/01/19 1054  05/03/19 1922 05/04/19 0149 05/04/19 1351  NA 117*   < > 126* 128* 126*  K 4.9   < > 4.2 4.7 4.3   CL 81*   < > 97* 99 97*  CO2 22   < > 19* 19* 22  BUN 13   < > 11 10 9   CREATININE 0.69   < > 0.65 0.57* 0.63  CALCIUM 8.8*   < > 8.4* 8.6* 8.5*  PROT 7.0  --   --   --   --   BILITOT 1.5*  --   --   --   --   ALKPHOS 48  --   --   --   --   ALT 13  --   --   --   --   AST 18  --   --   --   --   GLUCOSE 102*   < > 103* 98 103*   < > = values in this interval not displayed.   Imaging/Diagnostic Tests: No results found.  [name], [place], DO 05/05/2019, 12:06 AM PGY-3, [place] Intern pager: [phone], text pages welcome\

## 2019-05-04 NOTE — Progress Notes (Signed)
Family Medicine Teaching Service Daily Progress Note Intern Pager: [phone]  Patient name: [name]. Medical record number: [id number] Date of birth: [birth year] Age: 58 y.o. Gender: male  Primary Care Provider: Alvester Chou, NP Consultants: None at this time Code Status: Full code  Pt Overview and Major Events to Date:  05/01/2019-patient admitted for hyponatremia  Assessment and Plan: [name]. is a 58 y.o. male presenting with abdominal pain. PMH is significant for alcohol use, hx of pancreatitis, tobacco use, hx of empyema (VATS 11/2018), and chronic hyponatremia.   Abdominal pain/nausea/vomiting Patient admitted with a chief complaint of abdominal pain and nausea and vomiting.  He was also diaphoretic.  He was evaluated in the emergency room and found to have epigastric pain, none bloody vomiting, a sodium of 117, lipase 87, T bili 1.5, WBC 11.3.  Patient has a history of pancreatitis and reportedly felt like that was what was happening again.  Abdominal pain is improving.  Has occasional mild epigastric pain..  Patient received 2 doses of oxycodone.  We will repeat the BMP this afternoon and he may be able to discharge after that. -Advance to full liquid diet  -Protonix-40 mg p.o. at bedtime -IV Zofran 4 mg every 6 hours as needed -Oxycodone IR 5 mg every 6 hours as needed -Vitals per routine -IV fluids with D5 normal saline at 50 mL/hr  -Daily CBCs -Daily CMP -Hepatitis panel -HIV screen  Hyponatremia Patient's sodium admission was 117.  He was given approximately half a liter of fluid and it was trended to 118.  After admission he was started on maintenance fluids with D5 normal saline has signs were continue to trend with most recent being 128 -Twice daily -IVF maintenance fluid 50 mL an hour with normal saline -Monitor for signs and symptoms of worsening hyponatremia  Hypertension Blood pressure this morning 171/91.  Blood pressure range yesterday was  140/81-184/90 home medication includes 50 mg metoprolol twice daily. -Continue metoprolol 50 mg twice daily -Add losartan 25 mg daily -Monitored BP frequently  EtOH abuse Patient reports drinking approximately 12 beers a day.  On admission he reported he felt like he was going into DTs although he said he had never been through them before.  Per chart review patient was hospitalized in the ICU in 2018 for alcohol withdrawal.  Patient's last alcoholic drink was 9 AM on 05/01/2019.  CIWA in the ED recorded at 109.  Overnight CIWA's were not recorded since 9 PM.  CIWA at 9 PM was 3.  Patient received no doses of Ativan overnight.  Patient is interested in cessation help after discharge. -CIWA protocol -Ativan on board for CIWAS greater than 5.  Please follow prompts for Ativan administration  Bright red blood per rectum Patient reports he has had bleeding per rectum off and on for the past week or so.  He reports he has never had issues with this in the past.  Denies any dark tarry stools.  Patient is asymptomatic for signs of anemia.  Admission CBC showed hemoglobin of 12.6.  Hemoglobin this morning was 9.7 from 10.3 yesterday.  Patient reports he is never had a colonoscopy rectal exam showed multiple external hemorrhoids with none actively bleeding.  Patient reports he had a bowel movement yesterday 11/25.  He says it was soft and that was not dark and he saw no blood. -Monitor CBCs -Monitor bowel movements -Consider GI consult   Tobacco use Patient smokes 1-1.5 packs/day -Offered smoking cessation as well  as nicotine patches -Patient has nicotine patch in place  FEN/GI: N.p.o. PPx: SCDs  Disposition: Pending further evaluation  Subjective:  Patient is doing well this morning.  Reports he "feels a lot better".  Was walking returning from the bathroom when I arrived to the room.  Says that he wants to be discharged today for Thanksgiving.  He says that he is very interested in quitting  drinking and is not planning on drinking after discharge.  We discussed options for helping with cessation including naltrexone and gabapentin.  Patient reports he has had gabapentin in the past which helped "some ".  Patient denies headache, shortness of breath, chest pain, nausea, vomiting.  Patient said he had a loose stool this morning but there was no blood.  Patient says he has intermittent mild epigastric pain but is not complaining of tenderness at this time.  Objective: Temp:  [97.5 F (36.4 C)-98.2 F (36.8 C)] 98.2 F (36.8 C) (11/26 0539) Pulse Rate:  [79-96] 84 (11/26 0539) Resp:  [14-18] 14 (11/26 0539) BP: (134-166)/(70-95) 166/95 (11/26 0539) SpO2:  [97 %-100 %] 100 % (11/26 0539) Physical Exam: General: NAD, walking around the room when I enter.  He sits down on the side of the bed. HEENT: Atraumatic. Normocephalic.  Neck: No cervical lymphadenopathy.  Cardiac: RRR, no m/r/g Respiratory: CTAB, normal work of breathing Abdomen: soft, nontender to palpation over epigastric area where he was tender previously.  Nondistended GI: Rectal exam (05/02/2019) done showing external hemorrhoids with mild irritation, no active bleeding noted. Skin: warm and dry, no rashes noted Neuro: alert and oriented  Laboratory: Recent Labs  Lab 05/02/19 1013 05/03/19 0219 05/04/19 0149  WBC 13.3* 12.2* 12.0*  HGB 11.9* 10.3* 9.7*  HCT 37.1* 33.0* 32.6*  PLT 393 394 334   Recent Labs  Lab 05/01/19 1054  05/03/19 0219 05/03/19 1922 05/04/19 0149  NA 117*   < > 127* 126* 128*  K 4.9   < > 4.1 4.2 4.7  CL 81*   < > 96* 97* 99  CO2 22   < > 22 19* 19*  BUN 13   < > 13 11 10   CREATININE 0.69   < > 0.65 0.65 0.57*  CALCIUM 8.8*   < > 8.7* 8.4* 8.6*  PROT 7.0  --   --   --   --   BILITOT 1.5*  --   --   --   --   ALKPHOS 48  --   --   --   --   ALT 13  --   --   --   --   AST 18  --   --   --   --   GLUCOSE 102*   < > 105* 103* 98   < > = values in this interval not displayed.    Imaging/Diagnostic Tests: No results found.  Gifford Shave, MD 05/04/2019, 7:03 AM PGY-1, [place] Intern pager: [phone], text pages welcome\

## 2019-05-04 NOTE — Plan of Care (Signed)

## 2019-05-05 ENCOUNTER — Other Ambulatory Visit: Payer: Self-pay | Admitting: Family Medicine

## 2019-05-05 DIAGNOSIS — K739 Chronic hepatitis, unspecified: Secondary | ICD-10-CM

## 2019-05-05 LAB — CBC
HCT: 34.4 % — ABNORMAL LOW (ref 39.0–52.0)
Hemoglobin: 10 g/dL — ABNORMAL LOW (ref 13.0–17.0)
MCH: 23 pg — ABNORMAL LOW (ref 26.0–34.0)
MCHC: 29.1 g/dL — ABNORMAL LOW (ref 30.0–36.0)
MCV: 79.3 fL — ABNORMAL LOW (ref 80.0–100.0)
Platelets: 402 10*3/uL — ABNORMAL HIGH (ref 150–400)
RBC: 4.34 MIL/uL (ref 4.22–5.81)
RDW: 20.6 % — ABNORMAL HIGH (ref 11.5–15.5)
WBC: 10.1 10*3/uL (ref 4.0–10.5)
nRBC: 0 % (ref 0.0–0.2)

## 2019-05-05 LAB — BASIC METABOLIC PANEL
Anion gap: 10 (ref 5–15)
BUN: 8 mg/dL (ref 6–20)
CO2: 25 mmol/L (ref 22–32)
Calcium: 8.9 mg/dL (ref 8.9–10.3)
Chloride: 96 mmol/L — ABNORMAL LOW (ref 98–111)
Creatinine, Ser: 0.61 mg/dL (ref 0.61–1.24)
GFR calc Af Amer: >60 mL/min (ref >60)
GFR calc non Af Amer: >60 mL/min (ref >60)
Glucose, Bld: 93 mg/dL (ref 70–99)
Potassium: 4.5 mmol/L (ref 3.5–5.1)
Sodium: 131 mmol/L — ABNORMAL LOW (ref 135–145)

## 2019-05-05 LAB — MAGNESIUM: Magnesium: 2 mg/dL (ref 1.7–2.4)

## 2019-05-05 LAB — FERRITIN: Ferritin: 13 ng/mL — ABNORMAL LOW (ref 24–336)

## 2019-05-05 MED ORDER — LOSARTAN POTASSIUM 25 MG PO TABS: 25.0000 mg | ORAL_TABLET | Freq: Every day | ORAL | 0 refills | Status: AC

## 2019-05-05 MED ORDER — GABAPENTIN 300 MG PO CAPS: ORAL_CAPSULE | ORAL | 0 refills | Status: DC

## 2019-05-05 MED ORDER — FERROUS SULFATE 325 (65 FE) MG PO TABS: 325.0000 mg | ORAL_TABLET | ORAL | 0 refills | Status: AC

## 2019-05-05 NOTE — Discharge Instructions (Addendum)
Thank you for allowing [place] to participate in your care!    You were admitted for presumed pancreatitis likely due to your alcohol use.  We highly encourage that you stop drinking any alcohol.  It is very important that you follow-up with your primary care provider to follow-up on some regular screening.  You were also noted to have a low sodium while you were here.  Please try to drink plenty of regular fluids and eat a well-balanced diet (3 meals and 1 snack daily) in order to prevent that from happening again.  CARE PLAN: 1. We are referring you to a Gastroenterologist to be evaluated for Hepatitis C (which is curable now!) and for a colonoscopy. We recommend that you get a colonoscopy with a gastroenterologist because you have anemia. Anemia is low blood cell levels. A common cause of anemia is loss of blood in the intestines.  2. To treat your anemia, we also recommend that you take Iron supplements (Ferrous sulfate) 325mg  every other day indefinitely. 3. Your EKG showed some "ventricular tachycardia" - we recommend that your primary care doctor order an echocardiogram to evaluate for any heart or valvular disease. 4. Take Gabapentin 300mg  in the morning, 300mg  at lunch, and 600mg  at night to help decrease your dependence on alcohol.   If you experience worsening of your admission symptoms, develop shortness of breath, life threatening emergency, suicidal or homicidal thoughts you must seek medical attention immediately by calling 911 or calling your MD immediately  if symptoms less severe.   Therapy and Counseling Resources Most providers on this list will take Medicaid. Patients with commercial insurance or Medicare should contact their insurance company to get a list of in network providers.  [place] [address] [place], [place] [address]       [phone]     [facility] [address] [place], [place]     Jinny Blossom [facility] [address], [place], [place]  Family Solutions:  [place]. [place]  [facility]our[facility] Counseling:  [place] [address], [place]  [facility] (under- & uninsured) [address] [place] [phone]    [email or web address] [email or web address]     Phone:  [phone]     [place]  [place] [address]      [place], [place] ext [place]: [place], [place], [place]   [place] ([place] therapist) [address] [place] [address]   [place] [address]    [phone]    [facility]   [place],  [place], [place]   Secretary [place]  [place]  [facility]  [address] [place], [place]        [phone]  Open Access/Walk In Clinic under & uninsured [place],  [address] [place] [phone]):  Mon - Fri from 8 AM - 3 PM  Family Service of the [place],  ([place])   [place], [place]: [phone]) 8:30 - 12; 1 - 2:30  Family Service of the [place],  [place], [place]    [phone]):8:30 - 12; 2 -  3PM  [facility] [employer],  [address]  [place]; [phone]):   Mon - Fri 8 AM - 5 PM  Alcohol & Drug Services [place]  MWF 12:30 to 3:00 or call to schedule an appointment  [phone]  Specific Provider options Psychology Today  [email or web address].[email or web address]/us 1. click on find a therapist  2. enter your zip code 3. left side and select or tailor a therapist for your specific need.   [facility] Provider Directory [email or web address]   (Medicaid)   Follow all drop down to find a provider  [place] or [email or web address] [address], [place], [place] Recovery support and educational   In home counseling [place] Telephone: [phone]  office in [facility] [email or web address]   Does not take reg. Medicaid or Medicare private insurance [place], [place] health Choice, [place], [place], [place], [place], [place] Choice  24-Hour Availability:   [place]   [phone] or [phone]  Family Service of the [facility]   [facility]  [phone] (after hours)  Therapeutic Alternative/Mobile Crisis   [phone]  [place] National Suicide Hotline  316-618-9221 Diamantina Monks)  Call 911 or go to emergency room  [facility]  [phone]);  [place] and [employer], [phone]); [place], [place], [place], [place], [place], [place], [place]

## 2019-05-05 NOTE — Progress Notes (Signed)
[name]. to be discharged Home per MD order. Discussed prescriptions and follow up appointments with the patient. Prescriptions explained to patient; medication list explained in detail. Patient verbalized understanding.  Skin clean, dry and intact without evidence of skin break down, no evidence of skin tears noted. IV catheter discontinued intact. Site without signs and symptoms of complications. Dressing and pressure applied. Pt denies pain at the site currently. No complaints noted.  Patient free of lines, drains, and wounds.   An After Visit Summary (AVS) was printed and given to the patient. Patient escorted via wheelchair, and discharged home via private auto.  Amaryllis Dyke, RN

## 2019-05-06 LAB — HCV RNA QUANT
HCV Quantitative Log: 6.548 log10 IU/mL (ref >1.70)
HCV Quantitative: 3530000 IU/mL (ref >50)

## 2019-05-07 ENCOUNTER — Encounter: Payer: Self-pay | Admitting: Family Medicine

## 2019-06-12 ENCOUNTER — Inpatient Hospital Stay (HOSPITAL_COMMUNITY)
Admission: EM | Admit: 2019-06-12 | Discharge: 2019-08-04 | DRG: 003 | Disposition: A | Discharge status: Home Health | Payer: Medicaid Other | Attending: Internal Medicine | Admitting: Internal Medicine

## 2019-06-12 ENCOUNTER — Emergency Department (HOSPITAL_COMMUNITY): Payer: Medicaid Other

## 2019-06-12 ENCOUNTER — Encounter (HOSPITAL_COMMUNITY): Payer: Self-pay

## 2019-06-12 ENCOUNTER — Inpatient Hospital Stay (HOSPITAL_COMMUNITY): Payer: Medicaid Other

## 2019-06-12 ENCOUNTER — Other Ambulatory Visit: Payer: Self-pay

## 2019-06-12 DIAGNOSIS — R601 Generalized edema: Secondary | ICD-10-CM | POA: Diagnosis not present

## 2019-06-12 DIAGNOSIS — J9621 Acute and chronic respiratory failure with hypoxia: Secondary | ICD-10-CM | POA: Diagnosis present

## 2019-06-12 DIAGNOSIS — I469 Cardiac arrest, cause unspecified: Secondary | ICD-10-CM

## 2019-06-12 DIAGNOSIS — E874 Mixed disorder of acid-base balance: Secondary | ICD-10-CM | POA: Diagnosis not present

## 2019-06-12 DIAGNOSIS — R451 Restlessness and agitation: Secondary | ICD-10-CM | POA: Diagnosis not present

## 2019-06-12 DIAGNOSIS — B1920 Unspecified viral hepatitis C without hepatic coma: Secondary | ICD-10-CM

## 2019-06-12 DIAGNOSIS — J440 Chronic obstructive pulmonary disease with acute lower respiratory infection: Secondary | ICD-10-CM | POA: Diagnosis present

## 2019-06-12 DIAGNOSIS — I509 Heart failure, unspecified: Secondary | ICD-10-CM | POA: Diagnosis not present

## 2019-06-12 DIAGNOSIS — Z8709 Personal history of other diseases of the respiratory system: Secondary | ICD-10-CM

## 2019-06-12 DIAGNOSIS — J150 Pneumonia due to Klebsiella pneumoniae: Secondary | ICD-10-CM | POA: Diagnosis not present

## 2019-06-12 DIAGNOSIS — R001 Bradycardia, unspecified: Secondary | ICD-10-CM | POA: Diagnosis not present

## 2019-06-12 DIAGNOSIS — Z7289 Other problems related to lifestyle: Secondary | ICD-10-CM

## 2019-06-12 DIAGNOSIS — K0889 Other specified disorders of teeth and supporting structures: Secondary | ICD-10-CM

## 2019-06-12 DIAGNOSIS — K219 Gastro-esophageal reflux disease without esophagitis: Secondary | ICD-10-CM | POA: Diagnosis present

## 2019-06-12 DIAGNOSIS — A413 Sepsis due to Hemophilus influenzae: Principal | ICD-10-CM | POA: Diagnosis present

## 2019-06-12 DIAGNOSIS — R05 Cough: Secondary | ICD-10-CM

## 2019-06-12 DIAGNOSIS — R109 Unspecified abdominal pain: Secondary | ICD-10-CM

## 2019-06-12 DIAGNOSIS — J384 Edema of larynx: Secondary | ICD-10-CM | POA: Diagnosis not present

## 2019-06-12 DIAGNOSIS — F1010 Alcohol abuse, uncomplicated: Secondary | ICD-10-CM | POA: Diagnosis not present

## 2019-06-12 DIAGNOSIS — R0902 Hypoxemia: Secondary | ICD-10-CM

## 2019-06-12 DIAGNOSIS — L03115 Cellulitis of right lower limb: Secondary | ICD-10-CM | POA: Diagnosis not present

## 2019-06-12 DIAGNOSIS — L089 Local infection of the skin and subcutaneous tissue, unspecified: Secondary | ICD-10-CM | POA: Diagnosis not present

## 2019-06-12 DIAGNOSIS — Z930 Tracheostomy status: Secondary | ICD-10-CM | POA: Diagnosis not present

## 2019-06-12 DIAGNOSIS — J9690 Respiratory failure, unspecified, unspecified whether with hypoxia or hypercapnia: Secondary | ICD-10-CM

## 2019-06-12 DIAGNOSIS — R0602 Shortness of breath: Secondary | ICD-10-CM | POA: Diagnosis present

## 2019-06-12 DIAGNOSIS — I468 Cardiac arrest due to other underlying condition: Secondary | ICD-10-CM | POA: Diagnosis not present

## 2019-06-12 DIAGNOSIS — M109 Gout, unspecified: Secondary | ICD-10-CM | POA: Diagnosis not present

## 2019-06-12 DIAGNOSIS — M25471 Effusion, right ankle: Secondary | ICD-10-CM

## 2019-06-12 DIAGNOSIS — I808 Phlebitis and thrombophlebitis of other sites: Secondary | ICD-10-CM | POA: Diagnosis not present

## 2019-06-12 DIAGNOSIS — J14 Pneumonia due to Hemophilus influenzae: Secondary | ICD-10-CM | POA: Diagnosis present

## 2019-06-12 DIAGNOSIS — R34 Anuria and oliguria: Secondary | ICD-10-CM | POA: Diagnosis not present

## 2019-06-12 DIAGNOSIS — Z981 Arthrodesis status: Secondary | ICD-10-CM

## 2019-06-12 DIAGNOSIS — Z789 Other specified health status: Secondary | ICD-10-CM

## 2019-06-12 DIAGNOSIS — Z430 Encounter for attention to tracheostomy: Secondary | ICD-10-CM | POA: Diagnosis not present

## 2019-06-12 DIAGNOSIS — R7881 Bacteremia: Secondary | ICD-10-CM

## 2019-06-12 DIAGNOSIS — F17210 Nicotine dependence, cigarettes, uncomplicated: Secondary | ICD-10-CM | POA: Diagnosis present

## 2019-06-12 DIAGNOSIS — R0689 Other abnormalities of breathing: Secondary | ICD-10-CM | POA: Diagnosis not present

## 2019-06-12 DIAGNOSIS — R609 Edema, unspecified: Secondary | ICD-10-CM | POA: Diagnosis not present

## 2019-06-12 DIAGNOSIS — K859 Acute pancreatitis without necrosis or infection, unspecified: Secondary | ICD-10-CM | POA: Diagnosis not present

## 2019-06-12 DIAGNOSIS — A419 Sepsis, unspecified organism: Secondary | ICD-10-CM | POA: Diagnosis present

## 2019-06-12 DIAGNOSIS — F1120 Opioid dependence, uncomplicated: Secondary | ICD-10-CM | POA: Diagnosis present

## 2019-06-12 DIAGNOSIS — N179 Acute kidney failure, unspecified: Secondary | ICD-10-CM | POA: Diagnosis not present

## 2019-06-12 DIAGNOSIS — G9341 Metabolic encephalopathy: Secondary | ICD-10-CM

## 2019-06-12 DIAGNOSIS — M79606 Pain in leg, unspecified: Secondary | ICD-10-CM

## 2019-06-12 DIAGNOSIS — L03313 Cellulitis of chest wall: Secondary | ICD-10-CM | POA: Diagnosis present

## 2019-06-12 DIAGNOSIS — K122 Cellulitis and abscess of mouth: Secondary | ICD-10-CM | POA: Diagnosis not present

## 2019-06-12 DIAGNOSIS — I10 Essential (primary) hypertension: Secondary | ICD-10-CM | POA: Diagnosis present

## 2019-06-12 DIAGNOSIS — Y92230 Patient room in hospital as the place of occurrence of the external cause: Secondary | ICD-10-CM | POA: Diagnosis not present

## 2019-06-12 DIAGNOSIS — J449 Chronic obstructive pulmonary disease, unspecified: Secondary | ICD-10-CM

## 2019-06-12 DIAGNOSIS — B182 Chronic viral hepatitis C: Secondary | ICD-10-CM | POA: Diagnosis not present

## 2019-06-12 DIAGNOSIS — R509 Fever, unspecified: Secondary | ICD-10-CM | POA: Diagnosis not present

## 2019-06-12 DIAGNOSIS — R339 Retention of urine, unspecified: Secondary | ICD-10-CM | POA: Diagnosis not present

## 2019-06-12 DIAGNOSIS — R6521 Severe sepsis with septic shock: Secondary | ICD-10-CM | POA: Diagnosis not present

## 2019-06-12 DIAGNOSIS — G9340 Encephalopathy, unspecified: Secondary | ICD-10-CM | POA: Diagnosis not present

## 2019-06-12 DIAGNOSIS — Z6823 Body mass index (BMI) 23.0-23.9, adult: Secondary | ICD-10-CM

## 2019-06-12 DIAGNOSIS — M79604 Pain in right leg: Secondary | ICD-10-CM | POA: Diagnosis not present

## 2019-06-12 DIAGNOSIS — W19XXXA Unspecified fall, initial encounter: Secondary | ICD-10-CM | POA: Diagnosis not present

## 2019-06-12 DIAGNOSIS — J9611 Chronic respiratory failure with hypoxia: Secondary | ICD-10-CM | POA: Diagnosis not present

## 2019-06-12 DIAGNOSIS — Z720 Tobacco use: Secondary | ICD-10-CM

## 2019-06-12 DIAGNOSIS — J9622 Acute and chronic respiratory failure with hypercapnia: Secondary | ICD-10-CM | POA: Diagnosis present

## 2019-06-12 DIAGNOSIS — A492 Hemophilus influenzae infection, unspecified site: Secondary | ICD-10-CM | POA: Diagnosis not present

## 2019-06-12 DIAGNOSIS — F1020 Alcohol dependence, uncomplicated: Secondary | ICD-10-CM | POA: Diagnosis present

## 2019-06-12 DIAGNOSIS — I96 Gangrene, not elsewhere classified: Secondary | ICD-10-CM | POA: Diagnosis present

## 2019-06-12 DIAGNOSIS — R221 Localized swelling, mass and lump, neck: Secondary | ICD-10-CM | POA: Diagnosis not present

## 2019-06-12 DIAGNOSIS — L8990 Pressure ulcer of unspecified site, unspecified stage: Secondary | ICD-10-CM | POA: Insufficient documentation

## 2019-06-12 DIAGNOSIS — J189 Pneumonia, unspecified organism: Secondary | ICD-10-CM | POA: Diagnosis not present

## 2019-06-12 DIAGNOSIS — F11221 Opioid dependence with intoxication delirium: Secondary | ICD-10-CM | POA: Diagnosis not present

## 2019-06-12 DIAGNOSIS — R1312 Dysphagia, oropharyngeal phase: Secondary | ICD-10-CM | POA: Diagnosis present

## 2019-06-12 DIAGNOSIS — E440 Moderate protein-calorie malnutrition: Secondary | ICD-10-CM | POA: Insufficient documentation

## 2019-06-12 DIAGNOSIS — J9601 Acute respiratory failure with hypoxia: Secondary | ICD-10-CM | POA: Diagnosis not present

## 2019-06-12 DIAGNOSIS — F1021 Alcohol dependence, in remission: Secondary | ICD-10-CM | POA: Diagnosis not present

## 2019-06-12 DIAGNOSIS — R1310 Dysphagia, unspecified: Secondary | ICD-10-CM

## 2019-06-12 DIAGNOSIS — Z0189 Encounter for other specified special examinations: Secondary | ICD-10-CM

## 2019-06-12 DIAGNOSIS — J9811 Atelectasis: Secondary | ICD-10-CM

## 2019-06-12 DIAGNOSIS — Z9911 Dependence on respirator [ventilator] status: Secondary | ICD-10-CM | POA: Diagnosis not present

## 2019-06-12 DIAGNOSIS — L03211 Cellulitis of face: Secondary | ICD-10-CM | POA: Diagnosis present

## 2019-06-12 DIAGNOSIS — I82611 Acute embolism and thrombosis of superficial veins of right upper extremity: Secondary | ICD-10-CM | POA: Diagnosis not present

## 2019-06-12 DIAGNOSIS — D62 Acute posthemorrhagic anemia: Secondary | ICD-10-CM | POA: Diagnosis not present

## 2019-06-12 DIAGNOSIS — K5900 Constipation, unspecified: Secondary | ICD-10-CM | POA: Diagnosis not present

## 2019-06-12 DIAGNOSIS — E8770 Fluid overload, unspecified: Secondary | ICD-10-CM | POA: Diagnosis not present

## 2019-06-12 DIAGNOSIS — E876 Hypokalemia: Secondary | ICD-10-CM | POA: Diagnosis present

## 2019-06-12 DIAGNOSIS — Z20822 Contact with and (suspected) exposure to covid-19: Secondary | ICD-10-CM | POA: Diagnosis not present

## 2019-06-12 DIAGNOSIS — F10231 Alcohol dependence with withdrawal delirium: Secondary | ICD-10-CM | POA: Diagnosis not present

## 2019-06-12 DIAGNOSIS — L03221 Cellulitis of neck: Secondary | ICD-10-CM

## 2019-06-12 DIAGNOSIS — J988 Other specified respiratory disorders: Secondary | ICD-10-CM | POA: Diagnosis not present

## 2019-06-12 DIAGNOSIS — R1110 Vomiting, unspecified: Secondary | ICD-10-CM

## 2019-06-12 DIAGNOSIS — E870 Hyperosmolality and hypernatremia: Secondary | ICD-10-CM | POA: Diagnosis not present

## 2019-06-12 DIAGNOSIS — L89893 Pressure ulcer of other site, stage 3: Secondary | ICD-10-CM | POA: Diagnosis not present

## 2019-06-12 DIAGNOSIS — R059 Cough, unspecified: Secondary | ICD-10-CM

## 2019-06-12 DIAGNOSIS — Z781 Physical restraint status: Secondary | ICD-10-CM

## 2019-06-12 DIAGNOSIS — N5089 Other specified disorders of the male genital organs: Secondary | ICD-10-CM | POA: Diagnosis not present

## 2019-06-12 DIAGNOSIS — Z79899 Other long term (current) drug therapy: Secondary | ICD-10-CM

## 2019-06-12 DIAGNOSIS — Z978 Presence of other specified devices: Secondary | ICD-10-CM

## 2019-06-12 DIAGNOSIS — I471 Supraventricular tachycardia: Secondary | ICD-10-CM | POA: Diagnosis not present

## 2019-06-12 DIAGNOSIS — K089 Disorder of teeth and supporting structures, unspecified: Secondary | ICD-10-CM

## 2019-06-12 DIAGNOSIS — B963 Hemophilus influenzae [H. influenzae] as the cause of diseases classified elsewhere: Secondary | ICD-10-CM | POA: Diagnosis not present

## 2019-06-12 DIAGNOSIS — J0410 Acute tracheitis without obstruction: Secondary | ICD-10-CM | POA: Diagnosis present

## 2019-06-12 DIAGNOSIS — R410 Disorientation, unspecified: Secondary | ICD-10-CM | POA: Diagnosis not present

## 2019-06-12 LAB — CBC WITH DIFFERENTIAL/PLATELET
Abs Immature Granulocytes: 0.04 10*3/uL (ref 0.00–0.07)
Basophils Absolute: 0 10*3/uL (ref 0.0–0.1)
Basophils Relative: 0 %
Eosinophils Absolute: 0 10*3/uL (ref 0.0–0.5)
Eosinophils Relative: 0 %
HCT: 40.9 % (ref 39.0–52.0)
Hemoglobin: 12.2 g/dL — ABNORMAL LOW (ref 13.0–17.0)
Immature Granulocytes: 0 %
Lymphocytes Relative: 5 %
Lymphs Abs: 0.5 10*3/uL — ABNORMAL LOW (ref 0.7–4.0)
MCH: 23.6 pg — ABNORMAL LOW (ref 26.0–34.0)
MCHC: 29.8 g/dL — ABNORMAL LOW (ref 30.0–36.0)
MCV: 79.3 fL — ABNORMAL LOW (ref 80.0–100.0)
Monocytes Absolute: 0.6 10*3/uL (ref 0.1–1.0)
Monocytes Relative: 6 %
Neutro Abs: 9.4 10*3/uL — ABNORMAL HIGH (ref 1.7–7.7)
Neutrophils Relative %: 89 %
Platelets: 410 10*3/uL — ABNORMAL HIGH (ref 150–400)
RBC: 5.16 MIL/uL (ref 4.22–5.81)
RDW: 19.4 % — ABNORMAL HIGH (ref 11.5–15.5)
WBC Morphology: INCREASED
WBC: 10.6 10*3/uL — ABNORMAL HIGH (ref 4.0–10.5)
nRBC: 0 % (ref 0.0–0.2)

## 2019-06-12 LAB — COMPREHENSIVE METABOLIC PANEL
ALT: 28 U/L (ref 0–44)
ALT: <5 U/L (ref 0–44)
AST: 47 U/L — ABNORMAL HIGH (ref 15–41)
AST: 63 U/L — ABNORMAL HIGH (ref 15–41)
Albumin: 3 g/dL — ABNORMAL LOW (ref 3.5–5.0)
Albumin: 2 g/dL — ABNORMAL LOW (ref 3.5–5.0)
Alkaline Phosphatase: 61 U/L (ref 38–126)
Alkaline Phosphatase: 48 U/L (ref 38–126)
Anion gap: 18 — ABNORMAL HIGH (ref 5–15)
Anion gap: 17 — ABNORMAL HIGH (ref 5–15)
BUN: 21 mg/dL — ABNORMAL HIGH (ref 6–20)
BUN: 20 mg/dL (ref 6–20)
CO2: 20 mmol/L — ABNORMAL LOW (ref 22–32)
CO2: 21 mmol/L — ABNORMAL LOW (ref 22–32)
Calcium: 8.6 mg/dL — ABNORMAL LOW (ref 8.9–10.3)
Calcium: 8.2 mg/dL — ABNORMAL LOW (ref 8.9–10.3)
Chloride: 95 mmol/L — ABNORMAL LOW (ref 98–111)
Chloride: 103 mmol/L (ref 98–111)
Creatinine, Ser: 1.18 mg/dL (ref 0.61–1.24)
Creatinine, Ser: 1.32 mg/dL — ABNORMAL HIGH (ref 0.61–1.24)
GFR calc Af Amer: >60 mL/min (ref >60)
GFR calc Af Amer: >60 mL/min (ref >60)
GFR calc non Af Amer: >60 mL/min (ref >60)
GFR calc non Af Amer: 59 mL/min — ABNORMAL LOW (ref >60)
Glucose, Bld: 106 mg/dL — ABNORMAL HIGH (ref 70–99)
Glucose, Bld: 104 mg/dL — ABNORMAL HIGH (ref 70–99)
Potassium: 3.8 mmol/L (ref 3.5–5.1)
Potassium: 3.6 mmol/L (ref 3.5–5.1)
Sodium: 133 mmol/L — ABNORMAL LOW (ref 135–145)
Sodium: 141 mmol/L (ref 135–145)
Total Bilirubin: 0.5 mg/dL (ref 0.3–1.2)
Total Bilirubin: 0.5 mg/dL (ref 0.3–1.2)
Total Protein: 6.9 g/dL (ref 6.5–8.1)
Total Protein: 4.8 g/dL — ABNORMAL LOW (ref 6.5–8.1)

## 2019-06-12 LAB — CBC
HCT: 30.5 % — ABNORMAL LOW (ref 39.0–52.0)
HCT: 32.4 % — ABNORMAL LOW (ref 39.0–52.0)
Hemoglobin: 9.1 g/dL — ABNORMAL LOW (ref 13.0–17.0)
Hemoglobin: 9.4 g/dL — ABNORMAL LOW (ref 13.0–17.0)
MCH: 23.8 pg — ABNORMAL LOW (ref 26.0–34.0)
MCH: 23.4 pg — ABNORMAL LOW (ref 26.0–34.0)
MCHC: 29.8 g/dL — ABNORMAL LOW (ref 30.0–36.0)
MCHC: 29 g/dL — ABNORMAL LOW (ref 30.0–36.0)
MCV: 79.6 fL — ABNORMAL LOW (ref 80.0–100.0)
MCV: 80.8 fL (ref 80.0–100.0)
Platelets: 278 10*3/uL (ref 150–400)
Platelets: 331 10*3/uL (ref 150–400)
RBC: 3.83 MIL/uL — ABNORMAL LOW (ref 4.22–5.81)
RBC: 4.01 MIL/uL — ABNORMAL LOW (ref 4.22–5.81)
RDW: 19.1 % — ABNORMAL HIGH (ref 11.5–15.5)
RDW: 19.3 % — ABNORMAL HIGH (ref 11.5–15.5)
WBC: 5.9 10*3/uL (ref 4.0–10.5)
WBC: 5.9 10*3/uL (ref 4.0–10.5)
nRBC: 0 % (ref 0.0–0.2)
nRBC: 0 % (ref 0.0–0.2)

## 2019-06-12 LAB — TYPE AND SCREEN
ABO/RH(D): AB POS
Antibody Screen: NEGATIVE

## 2019-06-12 LAB — URINALYSIS, ROUTINE W REFLEX MICROSCOPIC
Bacteria, UA: NONE SEEN
Bilirubin Urine: NEGATIVE
Glucose, UA: NEGATIVE mg/dL
Ketones, ur: NEGATIVE mg/dL
Leukocytes,Ua: NEGATIVE
Nitrite: NEGATIVE
Protein, ur: 30 mg/dL — AB
Specific Gravity, Urine: 1.042 — ABNORMAL HIGH (ref 1.005–1.030)
pH: 5 (ref 5.0–8.0)

## 2019-06-12 LAB — BASIC METABOLIC PANEL
Anion gap: 11 (ref 5–15)
BUN: 20 mg/dL (ref 6–20)
CO2: 25 mmol/L (ref 22–32)
Calcium: 8.1 mg/dL — ABNORMAL LOW (ref 8.9–10.3)
Chloride: 105 mmol/L (ref 98–111)
Creatinine, Ser: 1.14 mg/dL (ref 0.61–1.24)
GFR calc Af Amer: >60 mL/min (ref >60)
GFR calc non Af Amer: >60 mL/min (ref >60)
Glucose, Bld: 106 mg/dL — ABNORMAL HIGH (ref 70–99)
Potassium: 3.5 mmol/L (ref 3.5–5.1)
Sodium: 141 mmol/L (ref 135–145)

## 2019-06-12 LAB — I-STAT CHEM 8, ED
BUN: 23 mg/dL — ABNORMAL HIGH (ref 6–20)
Calcium, Ion: 1.03 mmol/L — ABNORMAL LOW (ref 1.15–1.40)
Chloride: 97 mmol/L — ABNORMAL LOW (ref 98–111)
Creatinine, Ser: 0.9 mg/dL (ref 0.61–1.24)
Glucose, Bld: 103 mg/dL — ABNORMAL HIGH (ref 70–99)
HCT: 41 % (ref 39.0–52.0)
Hemoglobin: 13.9 g/dL (ref 13.0–17.0)
Potassium: 3.7 mmol/L (ref 3.5–5.1)
Sodium: 132 mmol/L — ABNORMAL LOW (ref 135–145)
TCO2: 23 mmol/L (ref 22–32)

## 2019-06-12 LAB — GLUCOSE, CAPILLARY
Glucose-Capillary: 94 mg/dL (ref 70–99)
Glucose-Capillary: 110 mg/dL — ABNORMAL HIGH (ref 70–99)
Glucose-Capillary: 133 mg/dL — ABNORMAL HIGH (ref 70–99)

## 2019-06-12 LAB — POCT I-STAT 7, (LYTES, BLD GAS, ICA,H+H)
Acid-Base Excess: 3 mmol/L — ABNORMAL HIGH (ref 0.0–2.0)
Bicarbonate: 28.3 mmol/L — ABNORMAL HIGH (ref 20.0–28.0)
Calcium, Ion: 1.16 mmol/L (ref 1.15–1.40)
HCT: 26 % — ABNORMAL LOW (ref 39.0–52.0)
Hemoglobin: 8.8 g/dL — ABNORMAL LOW (ref 13.0–17.0)
O2 Saturation: 100 %
Patient temperature: 37.1
Potassium: 3.8 mmol/L (ref 3.5–5.1)
Sodium: 141 mmol/L (ref 135–145)
TCO2: 30 mmol/L (ref 22–32)
pCO2 arterial: 47.9 mmHg (ref 32.0–48.0)
pH, Arterial: 7.38 (ref 7.350–7.450)
pO2, Arterial: 186 mmHg — ABNORMAL HIGH (ref 83.0–108.0)

## 2019-06-12 LAB — RESPIRATORY PANEL BY RT PCR (FLU A&B, COVID)
Influenza A by PCR: NEGATIVE
Influenza B by PCR: NEGATIVE
SARS Coronavirus 2 by RT PCR: NEGATIVE

## 2019-06-12 LAB — APTT
aPTT: 33 seconds (ref 24–36)
aPTT: 39 seconds — ABNORMAL HIGH (ref 24–36)

## 2019-06-12 LAB — LACTIC ACID, PLASMA
Lactic Acid, Venous: 4.7 mmol/L (ref 0.5–1.9)
Lactic Acid, Venous: 3 mmol/L (ref 0.5–1.9)
Lactic Acid, Venous: 9.2 mmol/L (ref 0.5–1.9)
Lactic Acid, Venous: 3.4 mmol/L (ref 0.5–1.9)

## 2019-06-12 LAB — PROCALCITONIN: Procalcitonin: 12.29 ng/mL

## 2019-06-12 LAB — PROTIME-INR
INR: 1.3 — ABNORMAL HIGH (ref 0.8–1.2)
INR: 1.5 — ABNORMAL HIGH (ref 0.8–1.2)
Prothrombin Time: 16 seconds — ABNORMAL HIGH (ref 11.4–15.2)
Prothrombin Time: 18.3 seconds — ABNORMAL HIGH (ref 11.4–15.2)

## 2019-06-12 LAB — MRSA PCR SCREENING: MRSA by PCR: NEGATIVE

## 2019-06-12 LAB — CORTISOL: Cortisol, Plasma: 46.2 ug/dL

## 2019-06-12 MED ORDER — SODIUM CHLORIDE 0.9 % IV BOLUS (SEPSIS)
1000.0000 mL | Freq: Once | INTRAVENOUS | Status: AC
  Administered 2019-06-12: 13:00:00 1000 mL via INTRAVENOUS

## 2019-06-12 MED ORDER — SODIUM CHLORIDE 0.9 % IV SOLN: INTRAVENOUS | Status: DC

## 2019-06-12 MED ORDER — MIDAZOLAM HCL 2 MG/2ML IJ SOLN
INTRAMUSCULAR | Status: AC
  Administered 2019-06-12: 16:00:00 2 mg via INTRAVENOUS
  Filled 2019-06-12: qty 2

## 2019-06-12 MED ORDER — CHLORHEXIDINE GLUCONATE 0.12% ORAL RINSE (MEDLINE KIT)
15.0000 mL | Freq: Two times a day (BID) | OROMUCOSAL | Status: DC
  Administered 2019-06-12 – 2019-06-30 (×34): 15 mL via OROMUCOSAL

## 2019-06-12 MED ORDER — PIPERACILLIN-TAZOBACTAM 3.375 G IVPB 30 MIN: 3.3750 g | Freq: Once | INTRAVENOUS | Status: DC

## 2019-06-12 MED ORDER — SODIUM CHLORIDE 0.9 % IV BOLUS (SEPSIS)
500.0000 mL | Freq: Once | INTRAVENOUS | Status: AC
  Administered 2019-06-12: 15:00:00 500 mL via INTRAVENOUS

## 2019-06-12 MED ORDER — SODIUM CHLORIDE 0.9 % IV SOLN
500.0000 mg | Freq: Once | INTRAVENOUS | Status: DC
  Administered 2019-06-12: 12:00:00 500 mg via INTRAVENOUS
  Filled 2019-06-12: qty 500

## 2019-06-12 MED ORDER — SODIUM CHLORIDE 0.9 % IV BOLUS (SEPSIS)
1000.0000 mL | Freq: Once | INTRAVENOUS | Status: AC
  Administered 2019-06-12: 14:00:00 1000 mL via INTRAVENOUS

## 2019-06-12 MED ORDER — DEXTROSE-NACL 5-0.45 % IV SOLN: INTRAVENOUS | Status: DC

## 2019-06-12 MED ORDER — FAMOTIDINE IN NACL 20-0.9 MG/50ML-% IV SOLN
20.0000 mg | INTRAVENOUS | Status: DC
  Administered 2019-06-12 – 2019-06-14 (×3): 20 mg via INTRAVENOUS
  Filled 2019-06-12 (×3): qty 50

## 2019-06-12 MED ORDER — PHENYLEPHRINE HCL-NACL 10-0.9 MG/250ML-% IV SOLN
20.0000 ug/min | INTRAVENOUS | Status: DC
  Administered 2019-06-12 (×2): 20 ug/min via INTRAVENOUS
  Administered 2019-06-13: 04:00:00 60 ug/min via INTRAVENOUS
  Administered 2019-06-13: 30 ug/min via INTRAVENOUS
  Administered 2019-06-13: 08:00:00 60 ug/min via INTRAVENOUS
  Administered 2019-06-13: 20 ug/min via INTRAVENOUS
  Filled 2019-06-12 (×6): qty 250

## 2019-06-12 MED ORDER — DEXAMETHASONE SODIUM PHOSPHATE 10 MG/ML IJ SOLN
10.0000 mg | Freq: Once | INTRAMUSCULAR | Status: DC
  Filled 2019-06-12 (×2): qty 1

## 2019-06-12 MED ORDER — VASOPRESSIN 20 UNIT/ML IV SOLN
0.0300 [IU]/min | INTRAVENOUS | Status: DC
  Filled 2019-06-12: qty 2

## 2019-06-12 MED ORDER — PROPOFOL 1000 MG/100ML IV EMUL
0.0000 ug/kg/min | INTRAVENOUS | Status: DC
  Administered 2019-06-12: 15:00:00 10 ug/kg/min via INTRAVENOUS
  Filled 2019-06-12: qty 100

## 2019-06-12 MED ORDER — EPINEPHRINE 1 MG/10ML IJ SOSY
PREFILLED_SYRINGE | INTRAMUSCULAR | Status: AC
  Filled 2019-06-12: qty 20

## 2019-06-12 MED ORDER — ORAL CARE MOUTH RINSE
15.0000 mL | OROMUCOSAL | Status: DC
  Administered 2019-06-12 – 2019-07-01 (×172): 15 mL via OROMUCOSAL

## 2019-06-12 MED ORDER — STERILE WATER FOR INJECTION IV SOLN
INTRAVENOUS | Status: DC
  Filled 2019-06-12: qty 850

## 2019-06-12 MED ORDER — SODIUM CHLORIDE 0.9 % IV SOLN: 500.0000 mL | INTRAVENOUS | Status: DC | PRN

## 2019-06-12 MED ORDER — SODIUM CHLORIDE 0.9 % IV BOLUS (SEPSIS)
500.0000 mL | Freq: Once | INTRAVENOUS | Status: AC
  Administered 2019-06-12: 12:00:00 500 mL via INTRAVENOUS

## 2019-06-12 MED ORDER — HEPARIN SODIUM (PORCINE) 5000 UNIT/ML IJ SOLN: 5000.0000 [IU] | Freq: Three times a day (TID) | INTRAMUSCULAR | Status: DC

## 2019-06-12 MED ORDER — NICOTINE 21 MG/24HR TD PT24
21.0000 mg | MEDICATED_PATCH | Freq: Every day | TRANSDERMAL | Status: DC
  Administered 2019-06-12 – 2019-07-17 (×36): 21 mg via TRANSDERMAL
  Filled 2019-06-12 (×36): qty 1

## 2019-06-12 MED ORDER — VANCOMYCIN HCL IN DEXTROSE 1-5 GM/200ML-% IV SOLN
1000.0000 mg | Freq: Two times a day (BID) | INTRAVENOUS | Status: DC
  Administered 2019-06-12: 23:00:00 1000 mg via INTRAVENOUS
  Filled 2019-06-12 (×2): qty 200

## 2019-06-12 MED ORDER — PIPERACILLIN-TAZOBACTAM 3.375 G IVPB
3.3750 g | Freq: Three times a day (TID) | INTRAVENOUS | Status: DC
  Administered 2019-06-12 – 2019-06-13 (×2): 3.375 g via INTRAVENOUS
  Filled 2019-06-12 (×3): qty 50

## 2019-06-12 MED ORDER — POTASSIUM CHLORIDE 10 MEQ/100ML IV SOLN
10.0000 meq | INTRAVENOUS | Status: AC
  Administered 2019-06-12 (×4): 10 meq via INTRAVENOUS
  Filled 2019-06-12 (×4): qty 100

## 2019-06-12 MED ORDER — CLINDAMYCIN PHOSPHATE 600 MG/50ML IV SOLN
600.0000 mg | Freq: Three times a day (TID) | INTRAVENOUS | Status: DC
  Administered 2019-06-12 – 2019-06-13 (×3): 600 mg via INTRAVENOUS
  Filled 2019-06-12 (×6): qty 50

## 2019-06-12 MED ORDER — SODIUM BICARBONATE 8.4 % IV SOLN
INTRAVENOUS | Status: AC
  Filled 2019-06-12: qty 100

## 2019-06-12 MED ORDER — CHLORHEXIDINE GLUCONATE CLOTH 2 % EX PADS
6.0000 | MEDICATED_PAD | Freq: Every day | CUTANEOUS | Status: DC
  Administered 2019-06-13 – 2019-06-23 (×11): 6 via TOPICAL

## 2019-06-12 MED ORDER — FENTANYL BOLUS VIA INFUSION
50.0000 ug | INTRAVENOUS | Status: DC | PRN
  Administered 2019-06-15 – 2019-06-23 (×26): 50 ug via INTRAVENOUS
  Filled 2019-06-12: qty 50

## 2019-06-12 MED ORDER — SODIUM CHLORIDE 0.9 % IV BOLUS
1000.0000 mL | Freq: Once | INTRAVENOUS | Status: AC
  Administered 2019-06-12: 10:00:00 1000 mL via INTRAVENOUS

## 2019-06-12 MED ORDER — FENTANYL CITRATE (PF) 100 MCG/2ML IJ SOLN
50.0000 ug | Freq: Once | INTRAMUSCULAR | Status: AC
  Administered 2019-06-12: 16:00:00 50 ug via INTRAVENOUS

## 2019-06-12 MED ORDER — FENTANYL CITRATE (PF) 100 MCG/2ML IJ SOLN
INTRAMUSCULAR | Status: AC
  Filled 2019-06-12: qty 2

## 2019-06-12 MED ORDER — FENTANYL 2500MCG IN NS 250ML (10MCG/ML) PREMIX INFUSION
50.0000 ug/h | INTRAVENOUS | Status: DC
  Administered 2019-06-12: 15:00:00 100 ug/h via INTRAVENOUS
  Administered 2019-06-13: 08:00:00 75 ug/h via INTRAVENOUS
  Administered 2019-06-14: 100 ug/h via INTRAVENOUS
  Administered 2019-06-15: 100 ug via INTRAVENOUS
  Administered 2019-06-15 – 2019-06-17 (×2): 75 ug/h via INTRAVENOUS
  Administered 2019-06-18: 02:00:00 200 ug/h via INTRAVENOUS
  Administered 2019-06-18 – 2019-06-19 (×2): 125 ug/h via INTRAVENOUS
  Administered 2019-06-20: 05:00:00 175 ug/h via INTRAVENOUS
  Administered 2019-06-21 – 2019-06-22 (×3): 100 ug/h via INTRAVENOUS
  Administered 2019-06-23: 16:00:00 75 ug/h via INTRAVENOUS
  Administered 2019-06-24: 06:00:00 200 ug/h via INTRAVENOUS
  Filled 2019-06-12 (×15): qty 250

## 2019-06-12 MED ORDER — IOHEXOL 300 MG/ML  SOLN
75.0000 mL | Freq: Once | INTRAMUSCULAR | Status: AC | PRN
  Administered 2019-06-12: 10:00:00 75 mL via INTRAVENOUS

## 2019-06-12 MED ORDER — ACETAMINOPHEN 650 MG RE SUPP
650.0000 mg | Freq: Once | RECTAL | Status: DC
  Filled 2019-06-12: qty 1

## 2019-06-12 MED ORDER — VANCOMYCIN HCL 1500 MG/300ML IV SOLN
1500.0000 mg | Freq: Once | INTRAVENOUS | Status: AC
  Administered 2019-06-12: 10:00:00 1500 mg via INTRAVENOUS
  Filled 2019-06-12: qty 300

## 2019-06-12 MED ORDER — SODIUM CHLORIDE 0.9 % IV SOLN
3.0000 g | Freq: Four times a day (QID) | INTRAVENOUS | Status: DC
  Administered 2019-06-12: 10:00:00 3 g via INTRAVENOUS
  Filled 2019-06-12 (×3): qty 8

## 2019-06-12 MED ORDER — MIDAZOLAM HCL 2 MG/2ML IJ SOLN
INTRAMUSCULAR | Status: AC
  Filled 2019-06-12: qty 2

## 2019-06-12 MED ORDER — MIDAZOLAM HCL 2 MG/2ML IJ SOLN: 2.0000 mg | Freq: Once | INTRAMUSCULAR | Status: AC

## 2019-06-12 MED FILL — Medication: Qty: 1 | Status: AC

## 2019-06-12 NOTE — ED Notes (Signed)
Patient transported to CT 

## 2019-06-12 NOTE — Progress Notes (Signed)
Notified bedside nurse of need to administer fluid bolus 2313 cc needed.

## 2019-06-12 NOTE — Procedures (Signed)
Percutaneous Tracheostomy Placement Emergently During Cardiac Arrest  Placed on 100% FiO2 and bagged.  Skin incision done followed by blunt dissection.  Trachea palpated then punctured, catheter passed and then airway.  Catheter removed.  Airway then entered and dilated.  Size 6 cuffed shiley trach placed and visualized bronchoscopically well above carina.  Good volume returns.  ROSC established at the end of the procedure.  [name], M.D. [facility] Pulmonary/Critical Care Medicine. Pager: [phone]. After hours pager: [phone].

## 2019-06-12 NOTE — Progress Notes (Signed)
Notified bedside nurse of need to draw repeat lactic acid. 

## 2019-06-12 NOTE — Procedures (Signed)
CPR Note:  PEA arrest, ACLS followed, SVT, shock x2 and amiodarone, ROSC established, see code sheet for details  [name], M.D. [facility] Pulmonary/Critical Care Medicine.

## 2019-06-12 NOTE — Progress Notes (Signed)
Pharmacy Antibiotic Note  [name]. is a 59 y.o. male admitted on 06/12/2019 with sepsis and Neck infection and possible PNA .  Pharmacy has been consulted for Zosyn, Clindamycin and Vancomycin dosing.  Plan: - Vancomycin 1000 mg IV q12h  - Est Calc AUC 467 - Zosyn 3.375 gm every 8 hours (EI) - Clindamycin 600 mg q 8 hours  - Monitor patient renal function and ability to narrow     Height: 5\' 8"  (172.7 cm) Weight: 170 lb (77.1 kg) IBW/kg (Calculated) : 68.4  Temp (24hrs), Avg:99.8 F (37.7 C), Min:99.8 F (37.7 C), Max:99.8 F (37.7 C)  Recent Labs  Lab 06/12/19 0902 06/12/19 0916 06/12/19 1229  WBC 10.6*  --   --   CREATININE 1.18 0.90  --   LATICACIDVEN 4.7*  --  3.0*    Estimated Creatinine Clearance: 87.6 mL/min (by C-G formula based on SCr of 0.9 mg/dL).    No Active Allergies     Thank you for allowing pharmacy to be a part of this patient's care.  Jimmy Footman, PharmD, BCPS, BCIDP Infectious Diseases Clinical Pharmacist Phone: [phone] 06/12/2019 2:20 PM

## 2019-06-12 NOTE — ED Notes (Signed)
Pt was found sitting on the foot of the bed stating "he's ready to leave" this RN convinced him to stay by explaining to him the importance it is to stay here for treatment. Pt was on RA & was sating in the 80's, 4L O2 via n/c was applied & he is sating 99% at this moment.

## 2019-06-12 NOTE — Progress Notes (Addendum)
PCCM Overnight  Active Issues addressed:  1. Hgb previously on Istat ABG was 5.1>> on repeat CBC 9.4>>No signs of active bleeding 2. Anion Gap Metabolic Acidosis secondary to Lactic Acidosis: Rpt LA 3.4 Metabolic panel shows AG closed>> will check ABG at 2200. Adequately compensated respiratory wise. For now continue on IVF and will reduce rate of Bicarb gtt in setting of post arrest and septic shock.  3. Left radial A line with dampened waveform improved when wrist is positioned will place TR Band when available. 4. Minimal UOP, condom cath in place- Bladder scan per RN showed >400cc--Place indwelling foley catheter and monitor Is and Os. 5. Septic Shock improving currently on Neo @ 25 mcg/min and not requiring any additional pressors.  6. S/p Emergent Perc Trach-> tracheostomy w/ clean dressing no issues receiving good volumes on PRVC.  7. CTH w/o contrast completed IMPRESSION: 1. Subtle loss of gray-white differentiation in the anterior right lentiform insular ribbon. This could represent an acute/subacute infarct. 2. Moderate generalized atrophy and diffuse white matter disease. This likely reflects the sequela of chronic microvascular ischemia. 3. No other acute intracranial abnormality. 4. Atherosclerosis. 5. Mild diffuse sinus disease. Performed Neuro exam at bedside after stopping fentanyl gtt no other analgesia or sedation was on at time of my evaluation.  Pt is responsive to verbal and tactile stimuli. Withdraws from pain. In all 4 extremities. RUE strength 4/5 muscle tone slightly less than LUE. LUE 4/5 strength. PERRLA Pupils +3.  Diaphoretic on exam.  Will discuss findings of CTH with on call Neuro>> spoke to Dr Leonel Ramsay and he recommends MRI w/o contrast to verify findings.   Signed Dr Seward Carol Pulmonary Critical Care Locums

## 2019-06-12 NOTE — Consult Note (Signed)
OTOLARYNGOLOGY CONSULTATION    Primary Care Physician: Alvester Chou, NP Patient Location at Initial Consult: Emergency Department Chief Complaint/Reason for Consult: airway compromise  History of Presenting Illness:    William L Timarion Trana. is a  59 y.o. male presenting with airway compromise and neck cellulitis. The patient is sedated and in the ICU during this visit. He has coded and had emergency percutaneous tracheostomy placed. He presented to the ED with neck redness and swelling, extending down to his chest, along with new onset shortness of breath. PMH significant for alcoholism, recent admission for pancreatitis, hepatitis C, COPD, poor dentition, DJD of lumbar spine. In the ED, he demonstrated elevated lactic acid. Minimal elevation to WBC (10).   Past Medical History:  Diagnosis Date  . Alcohol abuse   . Alcoholic ([place])   . Back pain   . CAP (community acquired pneumonia) 11/26/2018  . COPD (chronic obstructive pulmonary disease) ([place])   . Empyema of right pleural space ([place]) 11/26/2018  . Heavy smoker   . Hep C w/ coma, chronic ([place])   . Loose, teeth   . Pancreatitis   . Poor dentition   . Tobacco abuse     Past Surgical History:  Procedure Laterality Date  . ARTERY REPAIR  2013   neck  . BIOPSY  02/19/2019   Procedure: BIOPSY;  Surgeon: Ronnette Juniper, MD;  [facility];  Service: Gastroenterology;;  . ESOPHAGOGASTRODUODENOSCOPY (EGD) WITH PROPOFOL N/A 02/19/2019   Procedure: ESOPHAGOGASTRODUODENOSCOPY (EGD) WITH PROPOFOL;  Surgeon: Ronnette Juniper, MD;  Location: [place];  Service: Gastroenterology;  Laterality: N/A;  . FOREIGN BODY REMOVAL  02/19/2019   Procedure: FOREIGN BODY REMOVAL;  Surgeon: Ronnette Juniper, MD;  [facility];  Service: Gastroenterology;;  . KNEE SURGERY  2013   rt  . MICROLARYNGOSCOPY N/A 07/31/2014   Procedure: MICROLARYNGOSCOPY WITH BIOPSY ;  Surgeon: Rozetta Nunnery, MD;  Location: [place];  Service: ENT;   Laterality: N/A;  . NECK SURGERY  2013   cervical fusion-  . VASCULAR SURGERY    . VIDEO ASSISTED THORACOSCOPY (VATS)/EMPYEMA Right 11/26/2018   Procedure: VIDEO ASSISTED THORACOSCOPY (VATS)/EMPYEMA;  Surgeon: Rexene Alberts, MD;  Location: [place];  Service: Thoracic;  Laterality: Right;  [name] VIDEO BRONCHOSCOPY  11/26/2018   Procedure: Video Bronchoscopy;  Surgeon: Rexene Alberts, MD;  Location: [place];  Service: Thoracic;;    No family history on file.  Social History   Socioeconomic History  . Marital status: Single    Spouse name: Not on file  . Number of children: Not on file  . Years of education: Not on file  . Highest education level: Not on file  Occupational History  . Not on file  Tobacco Use  . Smoking status: Current Every Day Smoker    Packs/day: 2.00    Types: Cigarettes  . Smokeless tobacco: Never Used  Substance and Sexual Activity  . Alcohol use: Yes    Comment: alcoholic (4 [id number] a day). 12 pck a day  . Drug use: No  . Sexual activity: Not on file  Other Topics Concern  . Not on file  Social History Narrative  . Not on file   Social Determinants of Health   Financial Resource Strain:   . Difficulty of Paying Living Expenses: Not on file  Food Insecurity:   . Worried About Charity fundraiser in the Last Year: Not on file  . Ran Out of Food in the Last Year: Not  on file  Transportation Needs:   . Lack of Transportation (Medical): Not on file  . Lack of Transportation (Non-Medical): Not on file  Physical Activity:   . Days of Exercise per Week: Not on file  . Minutes of Exercise per Session: Not on file  Stress:   . Feeling of Stress : Not on file  Social Connections:   . Frequency of Communication with Friends and Family: Not on file  . Frequency of Social Gatherings with Friends and Family: Not on file  . Attends Religious Services: Not on file  . Active Member of Clubs or Organizations: Not on file  . Attends Archivist Meetings: Not  on file  . Marital Status: Not on file    No current facility-administered medications on file prior to encounter.   Current Outpatient Medications on File Prior to Encounter  Medication Sig Dispense Refill  . ferrous sulfate 325 (65 FE) MG tablet Take 1 tablet (325 mg total) by mouth every other day. 90 tablet 0  . gabapentin (NEURONTIN) 300 MG capsule Take 1 tablet (300mg ) each morning and at lunch, take 2 tablets (600mg ) at night. 120 capsule 0  . losartan (COZAAR) 25 MG tablet Take 1 tablet (25 mg total) by mouth daily. 30 tablet 0  . metoprolol tartrate (LOPRESSOR) 50 MG tablet Take 50 mg by mouth 2 (two) times daily.    . pantoprazole (PROTONIX) 40 MG tablet Take 1 tablet (40 mg total) by mouth 2 (two) times daily. 180 tablet 3    No Active Allergies   Review of Systems: ROS unable to be completed 2/2 patient mental status   OBJECTIVE: Vital Signs: Vitals:   06/12/19 1459 06/12/19 1522  BP:    Pulse:    Resp:    Temp:  98.9 F (37.2 C)  SpO2: 99%     I&O No intake or output data in the 24 hours ending 06/12/19 1543  Physical Exam General: obtunded  Head/Face:  midline scar above hyoid bone.  No facial lacerations. Salivary glands non tender and without palpable masses Significant erythema throughout anterior neck skin beginning about 1.5cm above hyoid bone, extending throughout the anterior neck skin and over ont he left chest down to the nipple line,  slightly pitting over chest No fluctuance  Eyes: Globes well positioned, no proptosis Lids: No periorbital edema/ecchymosis. No lid laceration Conjunctiva: No chemosis, hemorrhage PERRL Extra occular movement: Full ROM bilaterally. No gaze restriction    Ears: No gross deformity. Normal external canal.    Nose: No gross deformity or lesions. No purulent discharge. Septum midline. No turbinate hypertrophy. Nasal trumpet in place, right naris  Mouth/Oropharynx: Lips without any lesions. Dentition very poor. No  mucosal lesions within the oropharynx. No tonsillar enlargement, exudate, or lesions. Pharyngeal walls symmetrical.  Difficult oral examination- patient biting. FOM is soft, no abscesses palpated alone the alveolar ridges.   Neck: Trachea midline. Erythema and swelling as described above.  #6 DCT in place No masses. No thyromegaly or nodules palpated. No crepitus.  Lymphatic: No lymphadenopathy in the neck.  Respiratory: Vent Mode: PRVC FiO2 (%):  [100 %] 100 % Set Rate:  [18 bmp] 18 bmp Vt Set:  [540 mL] 540 mL PEEP:  [5 cmH20] 5 cmH20 Plateau Pressure:  [24 cmH20] 24 cmH20   Cardiovascular: Regular rate and rhythm.  Extremities: No edema or cyanosis. Cool.  Skin: See above  Neurologic: CN II-XII grossly intact. Moving all extremities without gross abnormality.  Other:  Labs: Lab Results  Component Value Date   WBC 5.9 06/12/2019   HGB 9.1 (L) 06/12/2019   HCT 30.5 (L) 06/12/2019   PLT 278 06/12/2019   ALT 28 06/12/2019   AST 47 (H) 06/12/2019   NA 132 (L) 06/12/2019   K 3.7 06/12/2019   CL 97 (L) 06/12/2019   CREATININE 0.90 06/12/2019   BUN 23 (H) 06/12/2019   CO2 20 (L) 06/12/2019   TSH 1.831 12/01/2018   INR 1.5 (H) 06/12/2019   HGBA1C 5.1 03/18/2011     Review of Ancillary Data / Diagnostic Tests:  WBC 10.6 Elevated lactic acid CT neck and chest personally reviewed- airway appears patent, mild posterior arytenoid swelling noted (likely mild Reinke's edema), mild epiglottis edema (?motion artifact) but patent airway. Floor of mouth is unchanged. No odontogenic abscesses.  CT chest with small multifocal PNA, no empyema or large consolidation. Saw Dr. Lucia Gaskins 2016- bilateral benign vocal cord biopsies, chronic inflammation of the bilateral true vocal cords.   ASSESSMENT:  59 y.o. male with anterior neck and chest cellulitis with reactive airway edema, multifocal pneumonia, airway compromise, sepsis. Now s/p code, ROSC, emergent percutaneous trach (by ICU),  arterial and central lines.   RECOMMENDATIONS:  -Antibiotics per Infectious Disease for management of cellulitis.  - Would recommend skin marker to mark out current region of cellulitis in order to clinically follow.  -Will follow. Will plan on bedside laryngoscopy at some point to look at his vocal cords once swelling improves. 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    William Pound, MD  [facility], [place] Office phone [phone]

## 2019-06-12 NOTE — ED Notes (Signed)
SWOT RN has left the unit with pt headed up to 88M, report was given to [employer]. Pt has stated twice more since this RN's last note that he wants to go home. With much encouragement & redirection this RN has been able to keep pt cooperative with at least staying, however, his nasal cannula has been repeatedly thrown into the floor. This RN noted that the last time he took it off his saturation went into the 70's before this RN reapplied his n/c back on & it rose back up to 98%.

## 2019-06-12 NOTE — Procedures (Addendum)
Central Venous Catheter Insertion Procedure Note [name] [id number] July 17, 1960  Procedure: Insertion of Central Venous Catheter Indications: Assessment of intravascular volume  Procedure Details Consent: Unable to obtain consent because of emergent medical necessity. Time Out: Verified patient identification, verified procedure, site/side was marked, verified correct patient position, special equipment/implants available, medications/allergies/relevent history reviewed, required imaging and test results available.  Performed  Maximum sterile technique was used including Antiseptics, cap, gloves, gown, hand hygiene, mask, and sheet.  Skin prep: Chlorhexidine; local anesthetic administered A antimicrobial bonded/coated triple lumen catheter was placed in the left femoral vein due to emergent situation using the Seldinger technique.  Evaluation Blood flow good Complications: No apparent complications Patient did tolerate procedure well. Chest X-ray ordered to verify placement.  CXR: pending.  Maudie Mercury 06/12/2019, 2:52 PM  I was present and supervised the entire procedure  [name], M.D. [facility] Pulmonary/Critical Care Medicine.

## 2019-06-12 NOTE — Procedures (Signed)
Central Venous Catheter Insertion Procedure Note Shadow Ensing [id number] 1960-06-10  Procedure: Insertion of Central Venous Catheter Indications: Assessment of intravascular volume, Drug and/or fluid administration and Frequent blood sampling  Procedure Details Consent: Unable to obtain consent because of emergent medical necessity. Time Out: Verified patient identification, verified procedure, site/side was marked, verified correct patient position, special equipment/implants available, medications/allergies/relevent history reviewed, required imaging and test results available.  Performed  Maximum sterile technique was used including antiseptics, cap, gloves, gown, hand hygiene, mask and sheet. Skin prep: Chlorhexidine; local anesthetic administered A antimicrobial bonded/coated triple lumen catheter was placed in the right femoral vein due to emergent situation using the Seldinger technique.  Evaluation Blood flow good Complications: No apparent complications Patient did tolerate procedure well. Chest X-ray ordered to verify placement.  CXR: not needed.   Montey Hora, [facility] 06/12/2019, 2:54 PM

## 2019-06-12 NOTE — Procedures (Signed)
Bronchoscopy Procedure Note [name] [id number] 1961-03-06  Procedure: Bronchoscopy Indications: Assistance for emergent trach placement  Procedure Details Consent: Unable to obtain consent because of emergent medical necessity.  Emergent tracheostomy performed at bedside for respiratory distress.  See separate note Bronchoscopy performed to assist with the procedure  Airway entered through the new tracheostomy and placement confirmed on appropriate position above the trachea. No major plugs or secretions or bleeding noted in the trachea  [name] MD [facility] Please see [email or web address] for pager details.  06/12/2019, 4:21 PM

## 2019-06-12 NOTE — Consult Note (Signed)
[facility] for Infectious Disease    Date of Admission:  06/12/2019      Total days of antibiotics 1  Clindamycin + Zosyn                Reason for Consult: Neck cellulitis, ?Lemierre's     Referring Provider: Nelda Marseille  Primary Care Provider: Alvester Chou, NP   Assessment: Constance Holster. is a 59 y.o. male with 6 day history of neck swelling and developing chest pain and difficulty breathing. He was found to have multifocal opacities as well as indurated soft tissue to the neck extending into the left chest wall; further neck soft tissue CT scan was done revealing left > right lower facial and infrahyoid neck subcutaneous edema and inflammation extending into the left anterior chest wall without formed/drainable collection at this time. Blood cultures are drawn and pending and currently PCCM is obtaining emergency airway due to collapse. Seems to have had rapid progression since admission - concerning for Ludwig's angina vs Lemierre's disease.   Patchy pneumonia may be due to possible aspiration given swelling onset 6 days ago and likely baseline swallow dysfunction. He has a history of poor dentition and drainage of empyema about 7 months ago as well as ER visit for food lodged in his throat.  Suspect that the swelling/infection of the neck + trismus his primary problem may be due to odontogenic source and would presume polymicrobial involvement. No organized abscess but motion degraded exam - could consider MRI if ENT feels this would further characterize location or be helpful for surgical planning.   Can continue current antimicrobials for now - would consider changing piperacillin tazobactam to cefepime and continue clindamycin for anaerobes as well as offer possible toxin inhibition given the profound swelling and location of his problem.   Hep C RNA (+) recently - no indications he has had any treatment attempt outpatient. Markers of synthetic liver function do not  suggest cirrhosis however may have reactive changes in setting of sepsis/infection. Given heavy alcohol use would not be surprised if he has accelerated fibrosis/cirrhosis with untreated Hep C. Not currently a priority and will consider for treatment in the future outpatient.     Plan: 1. Follow micro results  2. ENT consult pending  3. Continue zosyn + clindamycin for now    Active Problems:   Neck swelling   Laryngeal edema   Poor dentition   Septic shock (HCC)   Multifocal pneumonia   Airway compromise   . acetaminophen  650 mg Rectal Once  . dexamethasone (DECADRON) injection  10 mg Intravenous Once  . EPINEPHrine      . fentaNYL      . midazolam      . nicotine  21 mg Transdermal Daily  . sodium bicarbonate        HPI: Tayquan L Matteo Looker. is a 59 y.o. male who presented to ER at [facility] for shortness of breath and chest pain this morning. He was taken to the ER via EMS due to concern initially for allergic reaction due to difficulty breathing, trismus, cough and significant neck swelling.   He has a past medical history of alcohol use disorder, COPD, Hepatitis C (untreated), tobacco use, poor dentition, pancreatitis. He has a history of cervical spine fusion 2013 and has required a microlaryngoscopy with biopsy in the past; VATS for empyema Roxy Manns) s/p drainage June 2020. L4-L5 fusion with Dr. Ellene Route was scheduled 11/10 but  cancelled due to alcohol intoxication and general anesthesia risk (12 beers a day).   CT scan revealing soft tissue swelling in the left neck as well as multifocal pneumonia. COVID and Flu are both negative.   Mr. Lawes describes his symptoms started 6 days ago no per chart records - upon walking in to room in 65M he was being actively resuscitated and efforts at gaining access to airway were being made with emergency tracheostomy.     Review of Systems: Review of Systems  Unable to perform ROS: Critical illness    Past Medical History:  Diagnosis Date   . Alcohol abuse   . Alcoholic ([place])   . Back pain   . CAP (community acquired pneumonia) 11/26/2018  . COPD (chronic obstructive pulmonary disease) ([place])   . Empyema of right pleural space ([place]) 11/26/2018  . Heavy smoker   . Hep C w/ coma, chronic ([place])   . Loose, teeth   . Pancreatitis   . Poor dentition   . Tobacco abuse     Social History   Tobacco Use  . Smoking status: Current Every Day Smoker    Packs/day: 2.00    Types: Cigarettes  . Smokeless tobacco: Never Used  Substance Use Topics  . Alcohol use: Yes    Comment: alcoholic (4 [id number] a day). 12 pck a day  . Drug use: No    No family history on file. No Active Allergies  OBJECTIVE: Blood pressure (!) 149/117, pulse (!) 165, temperature 99.8 F (37.7 C), temperature source Oral, resp. rate (!) 35, height 5\' 8"  (1.727 m), weight 77.1 kg, SpO2 (!) 87 %.  Unable to examine patient at this time due to critical illness and interventions being provided by [facility] and Code team.   Lab Results Lab Results  Component Value Date   WBC 10.6 (H) 06/12/2019   HGB 13.9 06/12/2019   HCT 41.0 06/12/2019   MCV 79.3 (L) 06/12/2019   PLT 410 (H) 06/12/2019    Lab Results  Component Value Date   CREATININE 0.90 06/12/2019   BUN 23 (H) 06/12/2019   NA 132 (L) 06/12/2019   K 3.7 06/12/2019   CL 97 (L) 06/12/2019   CO2 20 (L) 06/12/2019    Lab Results  Component Value Date   ALT 28 06/12/2019   AST 47 (H) 06/12/2019   ALKPHOS 61 06/12/2019   BILITOT 0.5 06/12/2019     Microbiology: Recent Results (from the past 240 hour(s))  Respiratory Panel by RT PCR (Flu A&B, Covid) - Nasopharyngeal Swab     Status: None   Collection Time: 06/12/19 10:05 AM   Specimen: Nasopharyngeal Swab  Result Value Ref Range Status   SARS Coronavirus 2 by RT PCR NEGATIVE NEGATIVE Final    Comment: (NOTE) SARS-CoV-2 target nucleic acids are NOT DETECTED. The SARS-CoV-2 RNA is generally detectable in upper respiratoy specimens during the acute  phase of infection. The lowest concentration of SARS-CoV-2 viral copies this assay can detect is 131 copies/mL. A negative result does not preclude SARS-Cov-2 infection and should not be used as the sole basis for treatment or other patient management decisions. A negative result may occur with  improper specimen collection/handling, submission of specimen other than nasopharyngeal swab, presence of viral mutation(s) within the areas targeted by this assay, and inadequate number of viral copies (<131 copies/mL). A negative result must be combined with clinical observations, patient history, and epidemiological information. The expected result is Negative. Fact Sheet for Patients:  PinkCheek.be  Fact Sheet for Healthcare Providers:  GravelBags.it This test is not yet ap proved or cleared by the [place] FDA and  has been authorized for detection and/or diagnosis of SARS-CoV-2 by FDA under an Emergency Use Authorization (EUA). This EUA will remain  in effect (meaning this test can be used) for the duration of the COVID-19 declaration under Section 564(b)(1) of the Act, 21 U.S.C. section 360bbb-3(b)(1), unless the authorization is terminated or revoked sooner.    Influenza A by PCR NEGATIVE NEGATIVE Final   Influenza B by PCR NEGATIVE NEGATIVE Final    Comment: (NOTE) The Xpert Xpress SARS-CoV-2/FLU/RSV assay is intended as an aid in  the diagnosis of influenza from Nasopharyngeal swab specimens and  should not be used as a sole basis for treatment. Nasal washings and  aspirates are unacceptable for Xpert Xpress SARS-CoV-2/FLU/RSV  testing. Fact Sheet for Patients: PinkCheek.be Fact Sheet for Healthcare Providers: GravelBags.it This test is not yet approved or cleared by the [place] FDA and  has been authorized for detection and/or diagnosis of SARS-CoV-2 by    FDA under an Emergency Use Authorization (EUA). This EUA will remain  in effect (meaning this test can be used) for the duration of the  Covid-19 declaration under Section 564(b)(1) of the Act, 21  U.S.C. section 360bbb-3(b)(1), unless the authorization is  terminated or revoked. Performed at [facility], [place] [address] [place], [place] [address]      Janene Madeira, MSN, NP-C [facility] Infectious Disease [email or web address] Pager: [phone] Office: [phone] [place]: [phone]   06/12/2019 2:32 PM

## 2019-06-12 NOTE — Progress Notes (Signed)
Patient transported to CT and back to room [id number] without complications.

## 2019-06-12 NOTE — Procedures (Signed)
Arterial Catheter Insertion Procedure Note Radley Twing [id number] [birth year]  Procedure: Insertion of Arterial Catheter  Indications: Blood pressure monitoring and Frequent blood sampling  Procedure Details Consent: Unable to obtain consent because of emergent medical necessity. Time Out: Verified patient identification, verified procedure, site/side was marked, verified correct patient position, special equipment/implants available, medications/allergies/relevent history reviewed, required imaging and test results available.  Performed  Maximum sterile technique was used including antiseptics, cap, gloves, gown, hand hygiene, mask and sheet. Skin prep: Chlorhexidine; local anesthetic administered 20 gauge catheter was inserted into left radial artery using the Seldinger technique. ULTRASOUND GUIDANCE USED: NO Evaluation Blood flow good; BP tracing good. Complications: No apparent complications.   [name] 06/12/2019

## 2019-06-12 NOTE — ED Triage Notes (Signed)
Pt arrives  Sob  With sore  Red throat  And swelling and cp since yesterday  Pt states has been sick for week  , pt arrives ems could not get IV ,  Pt has raspy voice  Pt has resp of 38 cannot finish sentence o2 sat 93 fire hear rate 160 pt given benadryl 50 IM  And  .3 epi

## 2019-06-12 NOTE — Progress Notes (Signed)
Received paged for admission from Dr. Regenia Skeeter and went to evaluate patient at bedside.  Briefly, this is a 59 year old male with a history of AUD, HTN, tobacco use, and recent admission in 04/2019 for pancreatitis and hyponatremia who presented to the ED for evaluation of shortness of breath associated with sore throat and neck swelling that he reports started yesterday.  He is unable to provide further history due to dyspnea and hoarseness. He repeatedly says "I don't feel good."  Blood work is remarkable for mild leukocytosis, mild anemia, metabolic acidosis with anion gap, and lactic acidosis of 4.7.  CT chest showed multifocal pneumonia as well as chest soft tissue induration consistent with cellulitis.  CT soft tissue neck w/ contrast showed right lower facial and infrahyoid neck subcutaneous edema and inflammatory changes extending into the left anterior chest wall,  possible pharyngeal and supraglottic laryngeal wall edema with mild thickening of epiglottis. Airway appears patent.  No evidence of abscess or collection.   On my evaluation, patient was dyspneic with RR 30-40s and HR 150s. He is hoarse, unable to speak in full sentences and tripoding in the bed. There was intermittent audible inspiratory wheezing. Lungs sounded clear bilaterally but patient was taking fast, shallow breaths. There is impressive neck swelling as well as erythema throughout neck and upper chest. There is mild tongue swelling, I was unable to examine OP.   Patient at risk of impending respiratory failure and not appropriate for telemetry or progressive unit at this time. We recommended formal ICU consult for further evaluation. Discussed case with attending Dr. Daryll Drown who is in agreement with plan.   Welford Roche, MD  Internal Medicine PGY-3  P [phone]

## 2019-06-12 NOTE — Progress Notes (Signed)
Pharmacy Antibiotic Note  [name]. is a 59 y.o. male admitted on 06/12/2019 with sepsis and Neck infection and possible PNA .  Pharmacy has been consulted for Unasyn and Vancomycin dosing.  Height: 5\' 8"  (172.7 cm) Weight: 170 lb (77.1 kg) IBW/kg (Calculated) : 68.4  Temp (24hrs), Avg:99.8 F (37.7 C), Min:99.8 F (37.7 C), Max:99.8 F (37.7 C)  Recent Labs  Lab 06/12/19 0902 06/12/19 0916  WBC 10.6*  --   CREATININE 1.18 0.90  LATICACIDVEN 4.7*  --     Estimated Creatinine Clearance: 87.6 mL/min (by C-G formula based on SCr of 0.9 mg/dL).    No Active Allergies  Antimicrobials this admission: 1/4 Unasyn >>  1/4 Azithromycin  >>  1/4 Vancomycin >>  Dose adjustments this admission:   Microbiology results: 1/4 BCx: Pending  Plan: - Unasyn 3g IV q6h  - Vancomycin 1500mg  IV x 1 dose  - Followed by Vancomycin 1000 mg IV q12h  - Est Calc AUC 467 - Monitor patient renal function - MD asked if Abx needed changed for neck cellulitis/abscess and PNA I recommended the addition of Azithromycin to cover the patient for CAP (Unasyn + Azith) and cellulitis/abscess (Unasyn + Vanco)    Thank you for allowing pharmacy to be a part of this patient's care.  Duanne Limerick PharmD. BCPS  06/12/2019 11:15 AM

## 2019-06-12 NOTE — Progress Notes (Signed)
Chaplain responded to Code and contacted the family over the phone.  The chaplain is available to support if and when the family arrives.  Brion Aliment Chaplain Resident For questions concerning this note please contact me by pager [phone]

## 2019-06-12 NOTE — H&P (Addendum)
PULMONARY / CRITICAL CARE MEDICINE   NAME:  [name]., MRN:  [id number], DOB:  11/29/1960, LOS: 0 ADMISSION DATE:  06/12/2019, CONSULTATION DATE:  06/12/2019 REFERRING MD:  Alinda Sierras, CHIEF COMPLAINT:  Shortness of breath, sore throat, and neck swelling  BRIEF HISTORY:    59 year old man with a history of alcohol use disorder, tobacco used disorder, HTN, and recent admission on 04/10/2019 for pancreatitis and hyponatremia who presents for evaluation of shortness of breath and neck swelling.  HISTORY OF PRESENT ILLNESS   Presenting to ED for evaluation of shortness of breath associated with sore throat and neck swelling . reported to have started 5 days ago and progressively gotten worse. Patient tachycardic to 160's and tachypnic to the 40's in the ED. Seen in ED, patient says he has neck swelling , cough, and difficulty breathing. Denies any previous history , recent illness, or sick contacts.  SIGNIFICANT PAST MEDICAL HISTORY   Recent admission on 6/20  for Streptococcus Intermedius Empyema s/p VATS and antibiotic treatment. Discharged on 6/28 after improvement.  Recent admission 11/23 for pancreatitis and hyponatremia, thought likely due to beer potomania. Discharged on 11/27 with recommendations for GI follow up for Hep C and colonoscopy.     SIGNIFICANT EVENTS:  Admit to ICU on 1/4   STUDIES:    CT Soft Tissue Neck W/ Contrast (1/4)   IMPRESSION: Suboptimal evaluation due to motion artifact.  Left greater than right lower facial and infrahyoid neck subcutaneous edema and inflammatory changes extending into the left anterior chest wall. There is no collection.  Possible pharyngeal and supraglottic laryngeal wall edema, which could be accentuated by artifact. There is mild thickening of the epiglottis. Airway remains patent. No evidence of peritonsillar abscess.   CT Chest W Contrast (1/4)  IMPRESSION: 1. Patchy airspace consolidations within the lingula and  dependent left lower lobe, with some volume loss within the right lung base. Findings are favored to represent multifocal pneumonia. Follow-up to resolution is recommended. 2. Mildly enlarged mediastinal lymph nodes, likely reactive. 3. Induration within the soft tissues at the base of the neck extending into the superior aspect of the left chest wall with associated skin thickening, which may reflect cellulitis. Please refer to dedicated CT neck for further evaluation of the structures of the soft tissue neck. 4. Aortic and coronary artery atherosclerotic calcifications. 5. Minimal layering sludge versus small stones within the visualized gallbladder lumen. 6. Aortic Atherosclerosis (ICD10-I70.0).    CULTURES:  Influenza A : Negative Influenza B : Negtive Sars Coronavirus: Negative  ANTIBIOTICS:  Bactrim 1/4- stopped Azithromycin 1/4 - stopped  Vancomycin 1/4 --> Zosyn  1/4 --> Clindamycin -->   LINES/TUBES:  Peripheral IV in Right Antecubital Peripheral IV in Left Forearm  CONSULTANTS:  Infectious Disease Consulted ENT Consulted  SUBJECTIVE:   Patient unable to   CONSTITUTIONAL: BP (!) 167/103   Pulse (!) 153   Temp 99.8 F (37.7 C) (Oral)   Resp (!) 31   Ht 5\' 8"  (1.727 m)   Wt 77.1 kg   SpO2 100%   BMI 25.85 kg/m   No intake/output data recorded.       PHYSICAL EXAM: General:  Appears older than stated age, acute distress Neuro:  A&Ox3 , no focal deficits HEENT:  Trismus, swelling noted  Cardiovascular:  RRR, no mrg Lungs:  CTAB. 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 Patient has poor dentition , could be nidus of infection.  Temp 99.8,  mild leukocytosis, hypertensive, does not appear to be septic at this time.  P:  - Start Zosyn and Clindamycin - Consult ID, for antibiotic suggestions due to severe infection - Consult ENT - follow blood cultures - Trend vitals, CBC  Airway compromise  -CT neck demonstating thickening of epiglottis, pharyngeal and supraglottic wall edema. Concerned for mark Stridor on exam. Patient tripoding and on 4L supplemental oxygen.P: - Consult ENT, recommended decadron and racemic epinephrine. - Will prep for intubation for airway protection while monitoring.  Pneumonia   - Multifocal pneumonia on CT Chest, mild leukocytosis, T 99.8. History of empyema in 11/2018.  P: - Continue broad spectrum antibiotics   AGMA  AG 18, CO2 20, Lactic Acid 4.7 P:  NS  SUMMARY OF TODAY'S PLAN:  Patient intubated, continue antibiotics for possible Mnire's disease and multifocal pneumonia.   Best Practice / Goals of Care / Disposition.   DVT PROPHYLAXIS: SCD's SUP:  NUTRITION: NPO MOBILITY: sedated GOALS OF CARE: Full scope  FAMILY DISCUSSIONS: Spouse updated DISPOSITION : Guarded   LABS  Glucose No results for input(s): GLUCAP in the last 168 hours.  BMET Recent Labs  Lab 06/12/19 0902 06/12/19 0916  NA 133* 132*  K 3.8 3.7  CL 95* 97*  CO2 20*  --   BUN 21* 23*  CREATININE 1.18 0.90  GLUCOSE 106* 103*    Liver Enzymes Recent Labs  Lab 06/12/19 0902  AST 47*  ALT 28  ALKPHOS 61  BILITOT 0.5  ALBUMIN 3.0*    Electrolytes Recent Labs  Lab 06/12/19 0902  CALCIUM 8.6*    CBC Recent Labs  Lab 06/12/19 0902 06/12/19 0916  WBC 10.6*  --   HGB 12.2* 13.9  HCT 40.9 41.0  PLT 410*  --     ABG No results for input(s): PHART, PCO2ART, PO2ART in the last 168 hours.  Coag's Recent Labs  Lab 06/12/19 0902  APTT 33  INR 1.3*    Sepsis Markers Recent Labs  Lab 06/12/19 0902  LATICACIDVEN 4.7*    Cardiac Enzymes No results for input(s): TROPONINI, PROBNP in  the last 168 hours.  PAST MEDICAL HISTORY :   He  has a past medical history of Alcohol abuse, Alcoholic ([place]), Back pain, CAP (community acquired pneumonia) (11/26/2018), COPD (chronic obstructive pulmonary disease) ([place]), Empyema of right pleural space ([place]) (11/26/2018), Heavy smoker, Hep C w/ coma, chronic ([place]), Loose, teeth, Pancreatitis, Poor dentition, and Tobacco abuse.  PAST SURGICAL HISTORY:  He  has a past surgical history that includes Knee surgery (2013); Neck surgery (2013); Artery repair (2013); Microlaryngoscopy (N/A, 07/31/2014); Video assisted thoracoscopy (vats)/empyema (Right, 11/26/2018); Video bronchoscopy (11/26/2018); Esophagogastroduodenoscopy (egd) with propofol (N/A, 02/19/2019); biopsy (02/19/2019); Foreign Body Removal (02/19/2019); and Vascular surgery.  No Active Allergies  No current facility-administered medications on file prior to encounter.   Current Outpatient Medications on File Prior to Encounter  Medication Sig  . ferrous sulfate 325 (65 FE) MG tablet Take 1 tablet (325 mg total) by mouth every other day.  . gabapentin (NEURONTIN) 300 MG capsule Take 1 tablet (300mg ) each morning and at lunch, take 2 tablets (600mg ) at night.  . losartan (COZAAR) 25 MG tablet Take 1 tablet (25 mg total) by mouth daily.  [name]  metoprolol tartrate (LOPRESSOR) 50 MG tablet Take 50 mg by mouth 2 (two) times daily.  . pantoprazole (PROTONIX) 40 MG tablet Take 1 tablet (40 mg total) by mouth 2 (two) times daily.    FAMILY HISTORY:   His family history is not on file.  SOCIAL HISTORY:  He  reports that he has been smoking cigarettes. He has been smoking about 2.00 packs per day. He has never used smokeless tobacco. He reports current alcohol use. He reports that he does not use drugs.  REVIEW OF SYSTEMS:    Unable to complete due to patients in sedated.   Attending Note:  59 year old male with severe cellulitis of the left cheek extending down to the chest with mild airway  compromise on exam.  Presents here after 6 days of erythema and swelling.  Unable to talk and give history.  On exam, airway is not compromised but patient is unable to open his mouth fully with erythema from below the ear all the way down the nipple line.  I reviewed CXR myself, no acute disease noted but CT with erythema and swelling on the left all the way down to the nipple.  I am concerned about his airway condition.  Will start zosyn and clinda.  Pan culture.  Fluid resuscitate.  Consult ID and ENT.  PCCM will admit to the ICU for closer observation.  Patient arrived to the ICU and upon arrival lost control of his airway.  Anesthesia was in the process of being called when patient arrested.  Attempted intubation over a bronchoscope x2 with no success.  Finally decision was made to perform a tracheostomy emergently with the assistance of anesthesia controlling the airway and bagging.  Lurline Idol was placed emergently and ROSC was established.  ENT arrived to evaluate patient and ID to agrees with abx use.  The patient is critically ill with multiple organ systems failure and requires high complexity decision making for assessment and support, frequent evaluation and titration of therapies, application of advanced monitoring technologies and extensive interpretation of multiple databases.   Critical Care Time devoted to patient care services described in this note is  95  Minutes. This time reflects time of care of this signee Dr [name]. This critical care time does not reflect procedure time, or teaching time or supervisory time of PA/NP/Med student/Med Resident etc but could involve care discussion time.  [name], M.D. [facility] Pulmonary/Critical Care Medicine.

## 2019-06-12 NOTE — Progress Notes (Signed)
Notified that the patients lactic acid was elevated to 9.2 following his cardiac arrest. Additionally his BP is dropping with a MAP of ~55. ABG resulted at: 7.31/23.5/156/12-patient has a marked metabolic acidosis with elevated lactate and hypotension with shock.   Plan: Start Bicarb 147mEq/L at 18ml/hr Continue NS 22ml/hr Push amp Bicarb No change to vent settings as the patient appears to be compensating appropriately  Continue uptitration of Norepinephrine  Start Vasopressin for shock   CT head not yet resulted.   Kathi Ludwig, MD [facility] Internal Medicine, PGY-3 Pager # [phone]

## 2019-06-12 NOTE — Progress Notes (Signed)
Notified bedside nurse of need to administer fluid bolus.  

## 2019-06-12 NOTE — Progress Notes (Addendum)
[facility] responded to Code Blue. Upon arrival, informed pt has working central access for fluid and medication administration. Myself and K. Harris responded. Unable to sign code sheet.

## 2019-06-12 NOTE — ED Provider Notes (Signed)
[facility] EMERGENCY DEPARTMENT Provider Note   CSN: [id number] Arrival date & time: 06/12/19  [id number]     History Chief Complaint  Patient presents with   Shortness of Breath   Chest Pain    [name]. is a 59 y.o. male with PMH significant for alcohol use disorder, COPD, hepatitis C, and poor dentition who presents to the ED via EMS for possible allergic reaction.  Patient is tachypneic in the 40s and tachycardic in the 160s on arrival.  Patient is complaining of difficulty breathing and swallowing, trismus, mild cough, and significant neck swelling.  Patient denies any history of this happening before, recent illness, voice change, headache, or neurologic symptoms.  Patient states that his neck swelling began approximately 5 days ago and has gotten progressively worse.  Denies any obvious sick contacts or recent illness.   HPI     Past Medical History:  Diagnosis Date   Alcohol abuse    Alcoholic ([place])    Back pain    CAP (community acquired pneumonia) 11/26/2018   COPD (chronic obstructive pulmonary disease) (HCC)    Empyema of right pleural space ([place]) 11/26/2018   Heavy smoker    Hep C w/ coma, chronic (HCC)    Loose, teeth    Pancreatitis    Poor dentition    Tobacco abuse     Patient Active Problem List   Diagnosis Date Noted   Hyponatremia 05/01/2019   Alcohol dependence with uncomplicated withdrawal ([place])    Epigastric pain    Alcohol-induced acute pancreatitis    CAP (community acquired pneumonia) 11/26/2018   Empyema of right pleural space ([place]) 11/26/2018   Empyema, right ([place]) 11/26/2018   Alcohol abuse    Tobacco abuse    Poor dentition    Radiculopathy due to lumbar intervertebral disc disorder 12/29/2017   Chronic left-sided low back pain with left-sided sciatica 11/30/2017   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[place]) 01/31/2017    Past Surgical History:  Procedure  Laterality Date   ARTERY REPAIR  2013   neck   BIOPSY  02/19/2019   Procedure: BIOPSY;  Surgeon: Ronnette Juniper, MD;  Location: [facility];  Service: Gastroenterology;;   ESOPHAGOGASTRODUODENOSCOPY (EGD) WITH PROPOFOL N/A 02/19/2019   Procedure: ESOPHAGOGASTRODUODENOSCOPY (EGD) WITH PROPOFOL;  Surgeon: Ronnette Juniper, MD;  Location: [facility];  Service: Gastroenterology;  Laterality: N/A;   FOREIGN BODY REMOVAL  02/19/2019   Procedure: FOREIGN BODY REMOVAL;  Surgeon: Ronnette Juniper, MD;  Location[facility] ENDOSCOPY;  Service: Gastroenterology;;   KNEE SURGERY  2013   rt   MICROLARYNGOSCOPY N/A 07/31/2014   Procedure: MICROLARYNGOSCOPY WITH BIOPSY ;  Surgeon: Rozetta Nunnery, MD;  Location: [facility];  Service: ENT;  Laterality: N/A;   NECK SURGERY  2013   cervical fusion-   VASCULAR SURGERY     VIDEO ASSISTED THORACOSCOPY (VATS)/EMPYEMA Right 11/26/2018   Procedure: VIDEO ASSISTED THORACOSCOPY (VATS)/EMPYEMA;  Surgeon: Rexene Alberts, MD;  Location: [facility];  Service: Thoracic;  Laterality: Right;   VIDEO BRONCHOSCOPY  11/26/2018   Procedure: Video Bronchoscopy;  Surgeon: Rexene Alberts, MD;  Location: [facility];  Service: Thoracic;;       No family history on file.  Social History   Tobacco Use   Smoking status: Current Every Day Smoker    Packs/day: 2.00    Types: Cigarettes   Smokeless tobacco: Never Used  Substance Use Topics   Alcohol use: Yes    Comment: alcoholic (  4 40's a day). 12 pck a day   Drug use: No    Home Medications Prior to Admission medications   Medication Sig Start Date End Date Taking? Authorizing Provider  ferrous sulfate 325 (65 FE) MG tablet Take 1 tablet (325 mg total) by mouth every other day. 05/05/19 05/04/20  Daisy Floro, DO  gabapentin (NEURONTIN) 300 MG capsule Take 1 tablet (300mg ) each morning and at lunch, take 2 tablets (600mg ) at night. 05/05/19   Daisy Floro, DO  losartan (COZAAR) 25 MG tablet Take 1  tablet (25 mg total) by mouth daily. 05/05/19   Daisy Floro, DO  metoprolol tartrate (LOPRESSOR) 50 MG tablet Take 50 mg by mouth 2 (two) times daily. 04/17/19   [provider]  pantoprazole (PROTONIX) 40 MG tablet Take 1 tablet (40 mg total) by mouth 2 (two) times daily. 02/19/19 02/19/20  Ronnette Juniper, MD    Allergies    Patient has no active allergies.  Review of Systems   Review of Systems  All other systems reviewed and are negative.   Physical Exam Updated Vital Signs BP (!) 152/79    Pulse (!) 109    Temp 99.8 F (37.7 C) (Oral)    Resp (!) 30    Ht 5\' 8"  (1.727 m)    Wt 77.1 kg    SpO2 96%    BMI 25.85 kg/m   Physical Exam Vitals and nursing note reviewed. Exam conducted with a chaperone present.  Constitutional:      General: He is in acute distress.  HENT:     Head: Normocephalic.     Mouth/Throat:     Comments: Trismus, difficulty to fully evaluate oropharynx.  Swelling noted.  Poor dentition throughout.  No tongue swelling or soft palate induration. Eyes:     General: No scleral icterus.    Conjunctiva/sclera: Conjunctivae normal.  Neck:     Comments: Significant swelling, erythema, tenderness to palpation over the neck.  Swelling extends inferiorly into the chest. Cardiovascular:     Rate and Rhythm: Tachycardia present.     Pulses: Normal pulses.     Heart sounds: Normal heart sounds.  Pulmonary:     Comments: Tachypneic, increased work of breathing.  Wheezing auscultated throughout. Skin:    General: Skin is dry.  Neurological:     Mental Status: He is alert and oriented to person, place, and time.     GCS: GCS eye subscore is 4. GCS verbal subscore is 5. GCS motor subscore is 6.  Psychiatric:        Mood and Affect: Mood normal.        Behavior: Behavior normal.        Thought Content: Thought content normal.     ED Results / Procedures / Treatments   Labs (all labs ordered are listed, but only abnormal results are displayed) Labs  Reviewed  COMPREHENSIVE METABOLIC PANEL - Abnormal; Notable for the following components:      Result Value   Sodium 133 (*)    Chloride 95 (*)    CO2 20 (*)    Glucose, Bld 106 (*)    BUN 21 (*)    Calcium 8.6 (*)    Albumin 3.0 (*)    AST 47 (*)    Anion gap 18 (*)    All other components within normal limits  CBC WITH DIFFERENTIAL/PLATELET - Abnormal; Notable for the following components:   WBC 10.6 (*)    Hemoglobin 12.2 (*)  MCV 79.3 (*)    MCH 23.6 (*)    MCHC 29.8 (*)    RDW 19.4 (*)    Platelets 410 (*)    Neutro Abs 9.4 (*)    Lymphs Abs 0.5 (*)    All other components within normal limits  LACTIC ACID, PLASMA - Abnormal; Notable for the following components:   Lactic Acid, Venous 4.7 (*)    All other components within normal limits  PROTIME-INR - Abnormal; Notable for the following components:   Prothrombin Time 16.0 (*)    INR 1.3 (*)    All other components within normal limits  I-STAT CHEM 8, ED - Abnormal; Notable for the following components:   Sodium 132 (*)    Chloride 97 (*)    BUN 23 (*)    Glucose, Bld 103 (*)    Calcium, Ion 1.03 (*)    All other components within normal limits  CULTURE, BLOOD (ROUTINE X 2)  CULTURE, BLOOD (ROUTINE X 2)  RESPIRATORY PANEL BY RT PCR (FLU A&B, COVID)  URINE CULTURE  APTT  LACTIC ACID, PLASMA  URINALYSIS, ROUTINE W REFLEX MICROSCOPIC    EKG EKG Interpretation  Date/Time:  Monday June 12 2019 09:30:00 EST Ventricular Rate:  161 PR Interval:    QRS Duration: 83 QT Interval:  285 QTC Calculation: 467 R Axis:   169 Text Interpretation: Sinus or ectopic atrial tachycardia Anterior infarct, old rate is faster compared to Nov 2020 Confirmed by Sherwood Gambler [phone]) on 06/12/2019 9:49:52 AM   Radiology CT Chest W Contrast  Result Date: 06/12/2019 CLINICAL DATA:  Respiratory illness, shortness of breath EXAM: CT CHEST WITH CONTRAST TECHNIQUE: Multidetector CT imaging of the chest was performed during  intravenous contrast administration. CONTRAST:  98mL OMNIPAQUE IOHEXOL 300 MG/ML  SOLN COMPARISON:  CT 11/26/2018 FINDINGS: Cardiovascular: Heart size is mildly enlarged, stable. No pericardial effusion. Thoracic aorta is nonaneurysmal. There are atherosclerotic calcifications of the aorta and coronary arteries. Mediastinum/Nodes: Prominent high right paratracheal lymph node measuring 9 mm (series 3, image 27), appears slightly decreased in size compared to prior CT. Mildly enlarged AP window lymph node measuring 10 mm short axis (series 3, image 60), slightly increased in size from prior. No axillary or hilar lymphadenopathy identified. Thyroid gland, trachea, and esophagus are within normal limits. Lungs/Pleura: Patchy airspace consolidations within the lingula and dependent left lower lobe. There also airspace consolidations with some volume loss within the right lung base. Minimal tree-in-bud opacity within the perihilar aspect of the left upper lobe. No pleural effusion. No pneumothorax. Upper Abdomen: Minimal layering sludge versus small stones within the visualized gallbladder lumen. No acute findings within the visualized portion of the upper abdomen. Musculoskeletal: Chronic bilateral rib fractures. No acute osseous findings. There is induration within the soft tissues at the base of the neck extending into the superior aspect of the left chest wall with associated skin thickening, which may reflect cellulitis. IMPRESSION: 1. Patchy airspace consolidations within the lingula and dependent left lower lobe, with some volume loss within the right lung base. Findings are favored to represent multifocal pneumonia. Follow-up to resolution is recommended. 2. Mildly enlarged mediastinal lymph nodes, likely reactive. 3. Induration within the soft tissues at the base of the neck extending into the superior aspect of the left chest wall with associated skin thickening, which may reflect cellulitis. Please refer to  dedicated CT neck for further evaluation of the structures of the soft tissue neck. 4. Aortic and coronary artery atherosclerotic calcifications. 5. Minimal  layering sludge versus small stones within the visualized gallbladder lumen. 6. Aortic Atherosclerosis (ICD10-I70.0). Electronically Signed   By: Davina Poke D.O.   On: 06/12/2019 10:22    Procedures .Critical Care Performed by: Corena Herter, PA-C Authorized by: Corena Herter, PA-C   Critical care provider statement:    Critical care time (minutes):  45   Critical care was necessary to treat or prevent imminent or life-threatening deterioration of the following conditions:  Sepsis   Critical care was time spent personally by me on the following activities:  Blood draw for specimens, ordering and performing treatments and interventions, ordering and review of laboratory studies, ordering and review of radiographic studies, pulse oximetry, re-evaluation of patient's condition, review of old charts, obtaining history from patient or surrogate, examination of patient, evaluation of patient's response to treatment, discussions with consultants and development of treatment plan with patient or surrogate   (including critical care time)  Medications Ordered in ED Medications  acetaminophen (TYLENOL) suppository 650 mg (has no administration in time range)  Ampicillin-Sulbactam (UNASYN) 3 g in sodium chloride 0.9 % 100 mL IVPB (0 g Intravenous Stopped 06/12/19 1047)  vancomycin (VANCOREADY) IVPB 1500 mg/300 mL (1,500 mg Intravenous New Bag/Given 06/12/19 1000)  sodium chloride 0.9 % bolus 500 mL (has no administration in time range)  azithromycin (ZITHROMAX) 500 mg in sodium chloride 0.9 % 250 mL IVPB (has no administration in time range)  vancomycin (VANCOCIN) IVPB 1000 mg/200 mL premix (has no administration in time range)  sodium chloride 0.9 % bolus 1,000 mL (1,000 mLs Intravenous Bolus from Bag 06/12/19 0935)  sodium chloride 0.9 %  bolus 1,000 mL (1,000 mLs Intravenous Bolus from Bag 06/12/19 1000)  iohexol (OMNIPAQUE) 300 MG/ML solution 75 mL (75 mLs Intravenous Contrast Given 06/12/19 0941)    ED Course  I have reviewed the triage vital signs and the nursing notes.  Pertinent labs & imaging results that were available during my care of the patient were reviewed by me and considered in my medical decision making (see chart for details).  Clinical Course as of Jun 11 1136  Mon Jun 12, 2019  1033 Lactic Acid, Venous(!!): 4.7 [GG]  1044 Consulted with critical care who will evaluate patient to determine if they require their services   [GG]    Clinical Course User Index [GG] Corena Herter, PA-C   MDM Rules/Calculators/A&P                      Based on his initial presentation, concerned for deep tissue infection. CT demonstrates findings concerning for a multifocal pneumonia as well as induration of the soft tissues at base of neck extending into superior aspect of left chest wall.  ICU was consulted and did not feel as though he would require their services and instead suggest step-down unit.   Sepsis protocol initiated given his vital signs on initial examination and concern for deep tissue infection.  Lactic acid elevated 4.7. IVF, Unasyn, Vancomycin, and Zithromax has been administered. Tylenol PR given for fever.  WBC only mildly elevated to 10.6.  Respiratory panel obtained and pending at time of consult. On reevaluation, patient HR much better controlled, but still tachypneic. Hospitalist was consulted and will admit patient.      Final Clinical Impression(s) / ED Diagnoses Final diagnoses:  Cellulitis of neck  Sepsis, due to unspecified organism, unspecified whether acute organ dysfunction present [facility])    Rx / DC Orders ED Discharge Orders  None       Reita Chard 06/12/19 1138    Sherwood Gambler, MD 06/12/19 [phone]

## 2019-06-12 NOTE — Progress Notes (Signed)
[facility] Progress Note Patient Name: [name]. DOB: Oct 13, 1960 MRN: [id number]   Date of Service  06/12/2019  HPI/Events of Note  ABG: 7.38/48/186/30/BE +3  Bicarb drip running at 75cc/hr  eICU Interventions  Discontinue bicarb drip. Start D5 1/2 NS at 75cc/hr. CBG checks Q6H while NPO. RT to tirate down FiO2.     Intervention Category Major Interventions: Acid-Base disturbance - evaluation and management  Charlott Rakes 06/12/2019, 10:37 PM

## 2019-06-13 ENCOUNTER — Inpatient Hospital Stay (HOSPITAL_COMMUNITY): Payer: Medicaid Other

## 2019-06-13 DIAGNOSIS — R6521 Severe sepsis with septic shock: Secondary | ICD-10-CM

## 2019-06-13 DIAGNOSIS — B963 Hemophilus influenzae [H. influenzae] as the cause of diseases classified elsewhere: Secondary | ICD-10-CM

## 2019-06-13 DIAGNOSIS — R7881 Bacteremia: Secondary | ICD-10-CM

## 2019-06-13 DIAGNOSIS — J189 Pneumonia, unspecified organism: Secondary | ICD-10-CM

## 2019-06-13 DIAGNOSIS — Z9911 Dependence on respirator [ventilator] status: Secondary | ICD-10-CM

## 2019-06-13 DIAGNOSIS — A419 Sepsis, unspecified organism: Secondary | ICD-10-CM

## 2019-06-13 LAB — CBC
HCT: 27 % — ABNORMAL LOW (ref 39.0–52.0)
Hemoglobin: 7.9 g/dL — ABNORMAL LOW (ref 13.0–17.0)
MCH: 23.7 pg — ABNORMAL LOW (ref 26.0–34.0)
MCHC: 29.3 g/dL — ABNORMAL LOW (ref 30.0–36.0)
MCV: 80.8 fL (ref 80.0–100.0)
Platelets: 254 K/uL (ref 150–400)
RBC: 3.34 MIL/uL — ABNORMAL LOW (ref 4.22–5.81)
RDW: 19.3 % — ABNORMAL HIGH (ref 11.5–15.5)
WBC: 4 K/uL (ref 4.0–10.5)
nRBC: 0 % (ref 0.0–0.2)

## 2019-06-13 LAB — PHOSPHORUS: Phosphorus: 3.4 mg/dL (ref 2.5–4.6)

## 2019-06-13 LAB — BASIC METABOLIC PANEL
Anion gap: 9 (ref 5–15)
BUN: 26 mg/dL — ABNORMAL HIGH (ref 6–20)
CO2: 25 mmol/L (ref 22–32)
Calcium: 7.8 mg/dL — ABNORMAL LOW (ref 8.9–10.3)
Chloride: 105 mmol/L (ref 98–111)
Creatinine, Ser: 0.96 mg/dL (ref 0.61–1.24)
GFR calc Af Amer: >60 mL/min (ref >60)
GFR calc non Af Amer: >60 mL/min (ref >60)
Glucose, Bld: 128 mg/dL — ABNORMAL HIGH (ref 70–99)
Potassium: 3.9 mmol/L (ref 3.5–5.1)
Sodium: 139 mmol/L (ref 135–145)

## 2019-06-13 LAB — BLOOD CULTURE ID PANEL (REFLEXED)
Acinetobacter baumannii: NOT DETECTED
Candida albicans: NOT DETECTED
Candida glabrata: NOT DETECTED
Candida krusei: NOT DETECTED
Candida parapsilosis: NOT DETECTED
Candida tropicalis: NOT DETECTED
Enterobacter cloacae complex: NOT DETECTED
Enterobacteriaceae species: NOT DETECTED
Enterococcus species: NOT DETECTED
Escherichia coli: NOT DETECTED
Haemophilus influenzae: DETECTED — AB
Klebsiella oxytoca: NOT DETECTED
Klebsiella pneumoniae: NOT DETECTED
Listeria monocytogenes: NOT DETECTED
Neisseria meningitidis: NOT DETECTED
Proteus species: NOT DETECTED
Pseudomonas aeruginosa: NOT DETECTED
Serratia marcescens: NOT DETECTED
Staphylococcus aureus (BCID): NOT DETECTED
Staphylococcus species: NOT DETECTED
Streptococcus agalactiae: NOT DETECTED
Streptococcus pneumoniae: NOT DETECTED
Streptococcus pyogenes: NOT DETECTED
Streptococcus species: NOT DETECTED

## 2019-06-13 LAB — GLUCOSE, CAPILLARY
Glucose-Capillary: 113 mg/dL — ABNORMAL HIGH (ref 70–99)
Glucose-Capillary: 106 mg/dL — ABNORMAL HIGH (ref 70–99)
Glucose-Capillary: 111 mg/dL — ABNORMAL HIGH (ref 70–99)
Glucose-Capillary: 109 mg/dL — ABNORMAL HIGH (ref 70–99)

## 2019-06-13 LAB — URINE CULTURE: Culture: NO GROWTH

## 2019-06-13 LAB — MAGNESIUM: Magnesium: 1.7 mg/dL (ref 1.7–2.4)

## 2019-06-13 MED ORDER — DEXMEDETOMIDINE HCL IN NACL 400 MCG/100ML IV SOLN
0.4000 ug/kg/h | INTRAVENOUS | Status: DC
  Administered 2019-06-14: 1 ug/kg/h via INTRAVENOUS
  Administered 2019-06-14: 08:00:00 0.4 ug/kg/h via INTRAVENOUS
  Administered 2019-06-14: 1 ug/kg/h via INTRAVENOUS
  Administered 2019-06-15: 02:00:00 0.5 ug/kg/h via INTRAVENOUS
  Administered 2019-06-15 – 2019-06-16 (×3): 0.8 ug/kg/h via INTRAVENOUS
  Administered 2019-06-16: 0.6 ug/kg/h via INTRAVENOUS
  Administered 2019-06-16: 0.9 ug/kg/h via INTRAVENOUS
  Administered 2019-06-17: 1.2 ug/kg/h via INTRAVENOUS
  Administered 2019-06-17: 0.6 ug/kg/h via INTRAVENOUS
  Administered 2019-06-17 – 2019-06-18 (×4): 1.2 ug/kg/h via INTRAVENOUS
  Administered 2019-06-18: 1.1 ug/kg/h via INTRAVENOUS
  Administered 2019-06-18 (×2): 1.2 ug/kg/h via INTRAVENOUS
  Administered 2019-06-18: 1 ug/kg/h via INTRAVENOUS
  Administered 2019-06-19: 1.2 ug/kg/h via INTRAVENOUS
  Administered 2019-06-19: 1.1 ug/kg/h via INTRAVENOUS
  Filled 2019-06-13 (×8): qty 100
  Filled 2019-06-13: qty 200
  Filled 2019-06-13 (×11): qty 100

## 2019-06-13 MED ORDER — VITAL AF 1.2 CAL PO LIQD
1000.0000 mL | ORAL | Status: DC
  Administered 2019-06-13 – 2019-06-18 (×6): 1000 mL

## 2019-06-13 MED ORDER — FOLIC ACID 5 MG/ML IJ SOLN
1.0000 mg | Freq: Every day | INTRAMUSCULAR | Status: DC
  Administered 2019-06-13 – 2019-07-17 (×34): 1 mg via INTRAVENOUS
  Filled 2019-06-13 (×36): qty 0.2

## 2019-06-13 MED ORDER — PRO-STAT SUGAR FREE PO LIQD: 30.0000 mL | Freq: Two times a day (BID) | ORAL | Status: DC

## 2019-06-13 MED ORDER — SODIUM CHLORIDE 0.9 % IV SOLN
3.0000 g | Freq: Four times a day (QID) | INTRAVENOUS | Status: AC
  Administered 2019-06-13 – 2019-07-01 (×72): 3 g via INTRAVENOUS
  Filled 2019-06-13 (×16): qty 8
  Filled 2019-06-13: qty 3
  Filled 2019-06-13 (×58): qty 8

## 2019-06-13 MED ORDER — THIAMINE HCL 100 MG PO TABS
250.0000 mg | ORAL_TABLET | Freq: Every day | ORAL | Status: DC
  Administered 2019-06-13: 18:00:00 250 mg via ORAL
  Filled 2019-06-13 (×2): qty 3

## 2019-06-13 NOTE — Progress Notes (Signed)
Initial Nutrition Assessment  DOCUMENTATION CODES:   Non-severe (moderate) malnutrition in context of chronic illness  INTERVENTION:   Begin TF after Cortrak placement:  Vital AF 1.2 at 20 ml/h, increase by 10 ml every 8 hours to goal rate of 60 ml/h (1440 ml per day)  Provides 1728 kcal, 108 gm protein, 1168 ml free water daily  Monitor magnesium, potassium, and phosphorus, MD to replete as needed, as pt is at risk for refeeding syndrome given moderate malnutrition, hx of alcohol abuse.  If unable to place Cortrak, consider PICC placement (if able) for TPN.  NUTRITION DIAGNOSIS:   Moderate Malnutrition related to chronic illness(COPD, alcoholism) as evidenced by mild fat depletion, moderate fat depletion, mild muscle depletion.  GOAL:   Patient will meet greater than or equal to 90% of their needs  MONITOR:   Vent status, Labs, TF tolerance, Skin, I & O's  REASON FOR ASSESSMENT:   Ventilator    ASSESSMENT:   59 yo male admitted with SOB, neck swelling, sore throat. Found to have H. flu in blood. S/P PEA arrest requiring emergent tracheostomy 1/4. PMH includes alcohol abuse, dysphagia, chronic hepatitis C, heavy smoker, COPD, poor dentition.   Patient is currently intubated on ventilator support via trach. Currently requiring one pressor.  MV: 7.3 L/min Temp (24hrs), Avg:99.2 F (37.3 C), Min:98.6 F (37 C), Max:99.5 F (37.5 C)   Labs reviewed.  CBG's: 113-106  Medications reviewed and include neosynephrine.   Usual weights reviewed.  Weight is up from yesterday, I/O +2.6 L since admission. He continues to have swelling/edema to his neck and chest.  Patient currently has no enteral access. Per discussion with CCM, patient would benefit from a Cortrak tube, however, it may be difficult to place due to ongoing neck swelling. Cortrak RD to attempt Cortrak placement today.   NUTRITION - FOCUSED PHYSICAL EXAM:    Most Recent Value  Orbital Region  Moderate  depletion  Upper Arm Region  Unable to assess  Thoracic and Lumbar Region  Unable to assess  Buccal Region  Mild depletion  Temple Region  Mild depletion  Clavicle Bone Region  Unable to assess  Clavicle and Acromion Bone Region  Mild depletion  Scapular Bone Region  Unable to assess  Dorsal Hand  No depletion  Patellar Region  Mild depletion  Anterior Thigh Region  Mild depletion  Posterior Calf Region  Mild depletion  Edema (RD Assessment)  Mild  Hair  Reviewed  Eyes  Unable to assess  Mouth  Unable to assess  Skin  Reviewed  Nails  Reviewed       Diet Order:   Diet Order            Diet NPO time specified  Diet effective now              EDUCATION NEEDS:   Not appropriate for education at this time  Skin:  Skin Assessment: Reviewed RN Assessment(cellulitis to neck & chest)  Last BM:  no BM documented  Height:   Ht Readings from Last 1 Encounters:  06/12/19 5\' 8"  (1.727 m)    Weight:   Wt Readings from Last 1 Encounters:  06/12/19 77.1 kg    Ideal Body Weight:  70 kg  BMI:  Body mass index is 25.85 kg/m.  Estimated Nutritional Needs:   Kcal:  1680  Protein:  100-115 gm  Fluid:  >/= 1.8 L    Molli Barrows, RD, LDN, [place] Pager [phone] After Hours Pager [phone]

## 2019-06-13 NOTE — Progress Notes (Signed)
PT transported to MRI and back with no complications. 

## 2019-06-13 NOTE — Progress Notes (Signed)
ENT Consult Progress Note  Subjective:  Off neo. BP improving. Moving all 4 extremities.  DHT placed at bedside per nutrition.   Erythema/cellulitis improving. Patient still sedated, on vent.   Objective: Vitals:   06/13/19 1615 06/13/19 1630  BP:  130/78  Pulse: (!) 136 (!) 133  Resp: (!) 5 (!) 0  Temp: 99.3 F (37.4 C) 99.3 F (37.4 C)  SpO2: 92% 93%    Physical Exam: CONSTITUTIONAL: well developed, nourished, no distress and alert and oriented x 3 CARDIOVASCULAR: normal rate and regular rhythm  Neck & CHEST WALL:  Erythema pitting at base of neck, no fluctuance. Left chest wall with minimally improved erythema.   EYES: conjunctiva normal, EOM normal and PERRL  Trach in place, 6 DCT Vent Mode: PRVC FiO2 (%):  [30 %-60 %] 30 % Set Rate:  [18 bmp] 18 bmp Vt Set:  [540 mL] 540 mL PEEP:  [5 cmH20] 5 cmH20 Pressure Support:  [5 cmH20] 5 cmH20 Plateau Pressure:  [20 cmH20-22 cmH20] 21 cmH20    Data Review/Consults/Discussions:  Blood cultures  +Haemophilus influenza Elevated procalcitonin WBC 5.9  Assessment: [name]. 59 y.o. male who presents with neck cellulitis, airway compromise.    Plan: Antibiotics per ID Trach management per CCM/ICU Will follow. Will perform TFL scope examination when swelling is improved and patient can sit up better.    Thank you for involving [facility] in the care of this patient. Should you need further assistance, please call our office at [phone].    Gavin Pound, MD

## 2019-06-13 NOTE — Progress Notes (Signed)
PHARMACY - PHYSICIAN COMMUNICATION CRITICAL VALUE ALERT - BLOOD CULTURE IDENTIFICATION (BCID)  [name]. is an 58 y.o. male who presented to [facility] on 06/12/2019 with a chief complaint of neck abscess.   Assessment:  59 year old male admitted with head and neck infection/abscess. Now with H. Flu in 2/2 blood cultures.   Name of physician (or Provider) Contacted: Megan Salon  Current antibiotics: Vanc/zosyn/clinda  Changes to prescribed antibiotics recommended:  Narrow to Unasyn 3 gm every 6 hours  Results for orders placed or performed during the hospital encounter of 06/12/19  Blood Culture ID Panel (Reflexed) (Collected: 06/12/2019  9:26 AM)  Result Value Ref Range   Enterococcus species NOT DETECTED NOT DETECTED   Listeria monocytogenes NOT DETECTED NOT DETECTED   Staphylococcus species NOT DETECTED NOT DETECTED   Staphylococcus aureus (BCID) NOT DETECTED NOT DETECTED   Streptococcus species NOT DETECTED NOT DETECTED   Streptococcus agalactiae NOT DETECTED NOT DETECTED   Streptococcus pneumoniae NOT DETECTED NOT DETECTED   Streptococcus pyogenes NOT DETECTED NOT DETECTED   Acinetobacter baumannii NOT DETECTED NOT DETECTED   Enterobacteriaceae species NOT DETECTED NOT DETECTED   Enterobacter cloacae complex NOT DETECTED NOT DETECTED   Escherichia coli NOT DETECTED NOT DETECTED   Klebsiella oxytoca NOT DETECTED NOT DETECTED   Klebsiella pneumoniae NOT DETECTED NOT DETECTED   Proteus species NOT DETECTED NOT DETECTED   Serratia marcescens NOT DETECTED NOT DETECTED   Haemophilus influenzae DETECTED (A) NOT DETECTED   Neisseria meningitidis NOT DETECTED NOT DETECTED   Pseudomonas aeruginosa NOT DETECTED NOT DETECTED   Candida albicans NOT DETECTED NOT DETECTED   Candida glabrata NOT DETECTED NOT DETECTED   Candida krusei NOT DETECTED NOT DETECTED   Candida parapsilosis NOT DETECTED NOT DETECTED   Candida tropicalis NOT DETECTED NOT DETECTED   Jimmy Footman, PharmD,  BCPS, BCIDP Infectious Diseases Clinical Pharmacist Phone: [phone] 06/13/2019  9:35 AM
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
## 2019-06-13 NOTE — Progress Notes (Signed)
PCCM Overnight  MRI Brain w/o contrast completed Pt transported safely with no events  FINDINGS: BRAIN: There is no acute infarct, acute hemorrhage or extra-axial collection. Early confluent hyperintense T2-weighted signal of the periventricular and deep white matter, most commonly due to chronic ischemic microangiopathy. There is generalized atrophy without lobar predilection. The midline structures are normal. VASCULAR: The major intracranial arterial and venous sinus flow voids are normal. Susceptibility-sensitive sequences show no chronic microhemorrhage or superficial siderosis. SKULL AND UPPER CERVICAL SPINE: Calvarial bone marrow signal is normal. There is no skull base mass. The visualized upper cervical spine and soft tissues are normal. SINUSES/ORBITS: There are no fluid levels or advanced mucosal thickening. The mastoid air cells and middle ear cavities are free of fluid. The orbits are normal.  IMPRESSION: 1. No acute intracranial abnormality. 2. Generalized atrophy and moderate chronic small vessel disease.  .Signed Dr Seward Carol Pulmonary Critical Care Locums

## 2019-06-13 NOTE — Procedures (Signed)
Cortrak  Person Inserting Tube:  [name], RD Tube Type:  Cortrak - 43 inches Tube Location:  Right nare Initial Placement:  Stomach Secured by: Bridle Technique Used to Measure Tube Placement:  Documented cm marking at nare/ corner of mouth Cortrak Secured At:  65 cm Procedure Comments:  Cortrak Tube Team Note:  Consult received to place a Cortrak feeding tube.   No x-ray is required. RN may begin using tube.     If the tube becomes dislodged please keep the tube and contact the Cortrak team at [email or web address] ([email or web address]) for replacement.  If after hours and replacement cannot be delayed, place a NG tube and confirm placement with an abdominal x-ray.     [name], MS, RD, LDN, [facility] Inpatient Clinical Dietitian Pager # [phone] After hours/weekend pager # [phone]

## 2019-06-13 NOTE — Progress Notes (Signed)
[facility] for Infectious Disease  Date of Admission:  06/12/2019      Total days of antibiotics 1   Day 2 vancomycin / clinidamycin / zosyn             ASSESSMENT: Kamar L Real Cona. is a 59 y.o. male with head/neck infection with rapid progression of swelling to include airway collapse requiring emergency tracheostomy. He is opening eyes and showing efforts for purposeful response following events and concern over possible hypoxic event yesterday. Still requiring phenylephrine support but improving.  We have now learned that he has haemophilis influenzae bacteremia present in 4/4 bottles. Will narrow to Unasyn alone to allow for ongoing polymicrobial coverage, including anaerobes, given location.    PLAN: 1. Change antimicrobials to unasyn IV    Principal Problem:   Haemophilis influenzae bacteremia  Active Problems:   Neck swelling   Laryngeal edema   Cellulitis of neck   Alcohol abuse   Poor dentition   Septic shock (HCC)   Multifocal pneumonia   Airway compromise   Chronic hepatitis C without hepatic coma (HCC)   Acute respiratory failure with hypoxemia (HCC)   Cardiac arrest, cause unspecified ([place])   . acetaminophen  650 mg Rectal Once  . chlorhexidine gluconate (MEDLINE KIT)  15 mL Mouth Rinse BID  . Chlorhexidine Gluconate Cloth  6 each Topical Daily  . dexamethasone (DECADRON) injection  10 mg Intravenous Once  . mouth rinse  15 mL Mouth Rinse 10 times per day  . nicotine  21 mg Transdermal Daily    SUBJECTIVE: Opens eyes on the ventilator to voice.   Review of Systems: Review of Systems  Unable to perform ROS: Intubated    No Active Allergies  OBJECTIVE: Vitals:   06/13/19 1030 06/13/19 1045 06/13/19 1100 06/13/19 1113  BP: 122/74 120/71 118/71 118/71  Pulse: (!) 117 (!) 115 (!) 115 (!) 113  Resp: (!) 21 16 16 15   Temp: 99.5 F (37.5 C) 99.5 F (37.5 C) 99.3 F (37.4 C)   TempSrc:      SpO2: 97% 97% 98% 97%  Weight:        Height:       Body mass index is 25.85 kg/m.  Physical Exam Constitutional:      Comments: Resting in bed on ventilator. No distress. Well developed.   HENT:     Head: Normocephalic.  Neck:     Comments: Trach in place with dried bloody secretions. Jagged appearing healed scar across neck. Neck is still swollen and firm but to a lesser degree today. Erythema extends down to left anterior chest and is easily delineated  Cardiovascular:     Rate and Rhythm: Regular rhythm. Tachycardia present.     Heart sounds: No murmur.  Pulmonary:     Effort: Pulmonary effort is normal.     Breath sounds: Normal breath sounds. No decreased breath sounds.  Chest:     Chest wall: Edema present.     Comments: Erythema extending down to left anterior chest.  Abdominal:     General: Bowel sounds are normal.     Palpations: Abdomen is soft.  Skin:    General: Skin is warm and dry.     Capillary Refill: Capillary refill takes less than 2 seconds.     Lab Results Lab Results  Component Value Date   WBC 4.0 06/13/2019   HGB 7.9 (L) 06/13/2019   HCT 27.0 (L) 06/13/2019  MCV 80.8 06/13/2019   PLT 254 06/13/2019    Lab Results  Component Value Date   CREATININE 0.96 06/13/2019   BUN 26 (H) 06/13/2019   NA 139 06/13/2019   K 3.9 06/13/2019   CL 105 06/13/2019   CO2 25 06/13/2019    Lab Results  Component Value Date   ALT <5 06/12/2019   AST 63 (H) 06/12/2019   ALKPHOS 48 06/12/2019   BILITOT 0.5 06/12/2019     Microbiology: Recent Results (from the past 240 hour(s))  Culture, blood (routine x 2)     Status: None (Preliminary result)   Collection Time: 06/12/19  9:09 AM   Specimen: BLOOD  Result Value Ref Range Status   Specimen Description BLOOD LEFT ANTECUBITAL  Final   Special Requests   Final    BOTTLES DRAWN AEROBIC AND ANAEROBIC Blood Culture adequate volume   Culture  Setup Time   Final    GRAM NEGATIVE RODS IN BOTH AEROBIC AND ANAEROBIC BOTTLES CRITICAL RESULT CALLED  TO, READ BACK BY AND VERIFIED WITH: J. FRENS PHARMD, AT 1324 06/13/19 BY Rush Landmark Performed at [facility], [place] [address], [place], [place] [address]    Culture GRAM NEGATIVE RODS  Final   Report Status PENDING  Incomplete  Culture, blood (routine x 2)     Status: None (Preliminary result)   Collection Time: 06/12/19  9:26 AM   Specimen: BLOOD LEFT FOREARM  Result Value Ref Range Status   Specimen Description BLOOD LEFT FOREARM  Final   Special Requests   Final    BOTTLES DRAWN AEROBIC AND ANAEROBIC Blood Culture adequate volume   Culture  Setup Time   Final    GRAM NEGATIVE RODS IN BOTH AEROBIC AND ANAEROBIC BOTTLES CRITICAL RESULT CALLED TO, READ BACK BY AND VERIFIED WITH: J. FRENS PHARMD, AT 7253 06/13/19 BY Rush Landmark Performed at [facility], [place] [address] [place], [place] [address]    Culture GRAM NEGATIVE RODS  Final   Report Status PENDING  Incomplete  Blood Culture ID Panel (Reflexed)     Status: Abnormal   Collection Time: 06/12/19  9:26 AM  Result Value Ref Range Status   Enterococcus species NOT DETECTED NOT DETECTED Final   Listeria monocytogenes NOT DETECTED NOT DETECTED Final   Staphylococcus species NOT DETECTED NOT DETECTED Final   Staphylococcus aureus (BCID) NOT DETECTED NOT DETECTED Final   Streptococcus species NOT DETECTED NOT DETECTED Final   Streptococcus agalactiae NOT DETECTED NOT DETECTED Final   Streptococcus pneumoniae NOT DETECTED NOT DETECTED Final   Streptococcus pyogenes NOT DETECTED NOT DETECTED Final   Acinetobacter baumannii NOT DETECTED NOT DETECTED Final   Enterobacteriaceae species NOT DETECTED NOT DETECTED Final   Enterobacter cloacae complex NOT DETECTED NOT DETECTED Final   Escherichia coli NOT DETECTED NOT DETECTED Final   Klebsiella oxytoca NOT DETECTED NOT DETECTED Final   Klebsiella pneumoniae NOT DETECTED NOT DETECTED Final   Proteus species NOT DETECTED NOT DETECTED Final   Serratia marcescens NOT DETECTED NOT  DETECTED Final   Haemophilus influenzae DETECTED (A) NOT DETECTED Final    Comment: CRITICAL RESULT CALLED TO, READ BACK BY AND VERIFIED WITH: J. FRENS PHARMD, AT 3474 06/13/19 BY D. VANHOOK    Neisseria meningitidis NOT DETECTED NOT DETECTED Final   Pseudomonas aeruginosa NOT DETECTED NOT DETECTED Final   Candida albicans NOT DETECTED NOT DETECTED Final   Candida glabrata NOT DETECTED NOT DETECTED Final   Candida krusei NOT DETECTED NOT  DETECTED Final   Candida parapsilosis NOT DETECTED NOT DETECTED Final   Candida tropicalis NOT DETECTED NOT DETECTED Final    Comment: Performed at [facility], [place] [address], [place], [place] [address]  Respiratory Panel by RT PCR (Flu A&B, Covid) - Nasopharyngeal Swab     Status: None   Collection Time: 06/12/19 10:05 AM   Specimen: Nasopharyngeal Swab  Result Value Ref Range Status   SARS Coronavirus 2 by RT PCR NEGATIVE NEGATIVE Final    Comment: (NOTE) SARS-CoV-2 target nucleic acids are NOT DETECTED. The SARS-CoV-2 RNA is generally detectable in upper respiratoy specimens during the acute phase of infection. The lowest concentration of SARS-CoV-2 viral copies this assay can detect is 131 copies/mL. A negative result does not preclude SARS-Cov-2 infection and should not be used as the sole basis for treatment or other patient management decisions. A negative result may occur with  improper specimen collection/handling, submission of specimen other than nasopharyngeal swab, presence of viral mutation(s) within the areas targeted by this assay, and inadequate number of viral copies (<131 copies/mL). A negative result must be combined with clinical observations, patient history, and epidemiological information. The expected result is Negative. Fact Sheet for Patients:  PinkCheek.be Fact Sheet for Healthcare Providers:  GravelBags.it This test is not yet ap proved or cleared by the  [place] FDA and  has been authorized for detection and/or diagnosis of SARS-CoV-2 by FDA under an Emergency Use Authorization (EUA). This EUA will remain  in effect (meaning this test can be used) for the duration of the COVID-19 declaration under Section 564(b)(1) of the Act, 21 U.S.C. section 360bbb-3(b)(1), unless the authorization is terminated or revoked sooner.    Influenza A by PCR NEGATIVE NEGATIVE Final   Influenza B by PCR NEGATIVE NEGATIVE Final    Comment: (NOTE) The Xpert Xpress SARS-CoV-2/FLU/RSV assay is intended as an aid in  the diagnosis of influenza from Nasopharyngeal swab specimens and  should not be used as a sole basis for treatment. Nasal washings and  aspirates are unacceptable for Xpert Xpress SARS-CoV-2/FLU/RSV  testing. Fact Sheet for Patients: PinkCheek.be Fact Sheet for Healthcare Providers: GravelBags.it This test is not yet approved or cleared by the [place] FDA and  has been authorized for detection and/or diagnosis of SARS-CoV-2 by  FDA under an Emergency Use Authorization (EUA). This EUA will remain  in effect (meaning this test can be used) for the duration of the  Covid-19 declaration under Section 564(b)(1) of the Act, 21  U.S.C. section 360bbb-3(b)(1), unless the authorization is  terminated or revoked. Performed at [facility], [place] [address] [place], [place] [address]   Urine culture     Status: None   Collection Time: 06/12/19 12:56 PM   Specimen: In/Out Cath Urine  Result Value Ref Range Status   Specimen Description IN/OUT CATH URINE  Final   Special Requests NONE  Final   Culture   Final    NO GROWTH Performed at [facility], [place] [address] [place], [place] [address]    Report Status 06/13/2019 FINAL  Final  MRSA PCR Screening     Status: None   Collection Time: 06/12/19  4:58 PM   Specimen: Nasal Mucosa; Nasopharyngeal  Result Value Ref Range  Status   MRSA by PCR NEGATIVE NEGATIVE Final    Comment:        The GeneXpert MRSA Assay (FDA approved for NASAL specimens only), is one component of a comprehensive  MRSA colonization surveillance program. It is not intended to diagnose MRSA infection nor to guide or monitor treatment for MRSA infections. Performed at [facility], [place] [address], [place], [place] [address]      Janene Madeira, MSN, NP-C [facility] Infectious Disease [email or web address] Pager: [phone] Office: [phone] [place]: [phone]

## 2019-06-14 DIAGNOSIS — R451 Restlessness and agitation: Secondary | ICD-10-CM

## 2019-06-14 DIAGNOSIS — E440 Moderate protein-calorie malnutrition: Secondary | ICD-10-CM | POA: Insufficient documentation

## 2019-06-14 DIAGNOSIS — F1010 Alcohol abuse, uncomplicated: Secondary | ICD-10-CM

## 2019-06-14 DIAGNOSIS — R509 Fever, unspecified: Secondary | ICD-10-CM

## 2019-06-14 LAB — CBC
HCT: 28.7 % — ABNORMAL LOW (ref 39.0–52.0)
Hemoglobin: 8.1 g/dL — ABNORMAL LOW (ref 13.0–17.0)
MCH: 23.3 pg — ABNORMAL LOW (ref 26.0–34.0)
MCHC: 28.2 g/dL — ABNORMAL LOW (ref 30.0–36.0)
MCV: 82.7 fL (ref 80.0–100.0)
Platelets: 247 10*3/uL (ref 150–400)
RBC: 3.47 MIL/uL — ABNORMAL LOW (ref 4.22–5.81)
RDW: 19.9 % — ABNORMAL HIGH (ref 11.5–15.5)
WBC: 5.2 10*3/uL (ref 4.0–10.5)
nRBC: 0 % (ref 0.0–0.2)

## 2019-06-14 LAB — BASIC METABOLIC PANEL
Anion gap: 8 (ref 5–15)
BUN: 17 mg/dL (ref 6–20)
CO2: 26 mmol/L (ref 22–32)
Calcium: 8.2 mg/dL — ABNORMAL LOW (ref 8.9–10.3)
Chloride: 109 mmol/L (ref 98–111)
Creatinine, Ser: 0.67 mg/dL (ref 0.61–1.24)
GFR calc Af Amer: >60 mL/min (ref >60)
GFR calc non Af Amer: >60 mL/min (ref >60)
Glucose, Bld: 111 mg/dL — ABNORMAL HIGH (ref 70–99)
Potassium: 3.4 mmol/L — ABNORMAL LOW (ref 3.5–5.1)
Sodium: 143 mmol/L (ref 135–145)

## 2019-06-14 LAB — POCT I-STAT 7, (LYTES, BLD GAS, ICA,H+H)
Acid-base deficit: 13 mmol/L — ABNORMAL HIGH (ref 0.0–2.0)
Bicarbonate: 12 mmol/L — ABNORMAL LOW (ref 20.0–28.0)
Calcium, Ion: 0.87 mmol/L — CL (ref 1.15–1.40)
HCT: 15 % — ABNORMAL LOW (ref 39.0–52.0)
Hemoglobin: 5.1 g/dL — CL (ref 13.0–17.0)
O2 Saturation: 99 %
Patient temperature: 98.9
Potassium: <2 mmol/L — CL (ref 3.5–5.1)
Sodium: 147 mmol/L — ABNORMAL HIGH (ref 135–145)
TCO2: 13 mmol/L — ABNORMAL LOW (ref 22–32)
pCO2 arterial: 23.5 mmHg — ABNORMAL LOW (ref 32.0–48.0)
pH, Arterial: 7.316 — ABNORMAL LOW (ref 7.350–7.450)
pO2, Arterial: 156 mmHg — ABNORMAL HIGH (ref 83.0–108.0)

## 2019-06-14 LAB — GLUCOSE, CAPILLARY
Glucose-Capillary: 101 mg/dL — ABNORMAL HIGH (ref 70–99)
Glucose-Capillary: 106 mg/dL — ABNORMAL HIGH (ref 70–99)
Glucose-Capillary: 114 mg/dL — ABNORMAL HIGH (ref 70–99)
Glucose-Capillary: 133 mg/dL — ABNORMAL HIGH (ref 70–99)
Glucose-Capillary: 138 mg/dL — ABNORMAL HIGH (ref 70–99)
Glucose-Capillary: 89 mg/dL (ref 70–99)

## 2019-06-14 LAB — TRIGLYCERIDES: Triglycerides: 138 mg/dL (ref <150)

## 2019-06-14 LAB — MAGNESIUM: Magnesium: 2 mg/dL (ref 1.7–2.4)

## 2019-06-14 LAB — PHOSPHORUS: Phosphorus: 2.2 mg/dL — ABNORMAL LOW (ref 2.5–4.6)

## 2019-06-14 MED ORDER — THIAMINE HCL 100 MG PO TABS
100.0000 mg | ORAL_TABLET | Freq: Every day | ORAL | Status: DC
  Administered 2019-06-15 – 2019-07-28 (×42): 100 mg
  Filled 2019-06-14 (×44): qty 1

## 2019-06-14 MED ORDER — METOPROLOL TARTRATE 5 MG/5ML IV SOLN
5.0000 mg | Freq: Once | INTRAVENOUS | Status: AC
  Administered 2019-06-14: 5 mg via INTRAVENOUS

## 2019-06-14 MED ORDER — METOPROLOL TARTRATE 12.5 MG HALF TABLET
25.0000 mg | ORAL_TABLET | Freq: Two times a day (BID) | ORAL | Status: DC
  Administered 2019-06-14 – 2019-06-20 (×11): 25 mg
  Filled 2019-06-14 (×11): qty 2

## 2019-06-14 MED ORDER — METOPROLOL TARTRATE 5 MG/5ML IV SOLN
5.0000 mg | Freq: Four times a day (QID) | INTRAVENOUS | Status: DC | PRN
  Administered 2019-06-15 – 2019-06-27 (×8): 5 mg via INTRAVENOUS
  Filled 2019-06-14 (×11): qty 5

## 2019-06-14 MED ORDER — THIAMINE HCL 100 MG PO TABS: 250.0000 mg | ORAL_TABLET | Freq: Every day | ORAL | Status: DC

## 2019-06-14 MED ORDER — ACETAMINOPHEN 160 MG/5ML PO SOLN
650.0000 mg | Freq: Four times a day (QID) | ORAL | Status: DC | PRN
  Administered 2019-06-14 – 2019-06-23 (×8): 650 mg
  Filled 2019-06-14 (×8): qty 20.3

## 2019-06-14 MED ORDER — POTASSIUM PHOSPHATES 15 MMOLE/5ML IV SOLN
20.0000 mmol | Freq: Once | INTRAVENOUS | Status: AC
  Administered 2019-06-14: 07:00:00 20 mmol via INTRAVENOUS
  Filled 2019-06-14: qty 6.67

## 2019-06-14 MED ORDER — METOPROLOL TARTRATE 5 MG/5ML IV SOLN
INTRAVENOUS | Status: AC
  Administered 2019-06-14: 5 mg via INTRAVENOUS
  Filled 2019-06-14: qty 5

## 2019-06-14 MED ORDER — HEPARIN SODIUM (PORCINE) 5000 UNIT/ML IJ SOLN
5000.0000 [IU] | Freq: Three times a day (TID) | INTRAMUSCULAR | Status: DC
  Administered 2019-06-14 – 2019-06-22 (×24): 5000 [IU] via SUBCUTANEOUS
  Filled 2019-06-14 (×23): qty 1

## 2019-06-14 NOTE — Progress Notes (Signed)
[facility] Progress Note Patient Name: [name]. DOB: Aug 18, 1960 MRN: [id number]   Date of Service  06/14/2019  HPI/Events of Note  Fever to 103.6 F - No response to Tylenol and ice packs. Request for cooling blanket. Already on Unasyn for H. Flu and re-cultured earlier today.  eICU Interventions  Will order: 1. Cooling blanket PRN.      Intervention Category Major Interventions: Infection - evaluation and management  Andria Head Eugene 06/14/2019, 10:29 PM

## 2019-06-14 NOTE — Progress Notes (Signed)
Patient has not been following commands or responding to any painful stimuli for me since the beginning of my shift and despite turning off sedation since around 2200 p.m. Another RN was asked to confirm the findings, however he suddenly became agitated and near combative. He is back on low dose sedation. Will continue to monitor throughout shift.
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
## 2019-06-14 NOTE — Progress Notes (Signed)
[facility]  Date of Admission:  06/12/2019      Total days of antibiotics 2   Day 2 unasyn            ASSESSMENT: William L Xerxes Agrusa. is a 59 y.o. male with deep head/neck infection with rapid progression of swelling to include airway collapse requiring emergency tracheostomy and secondary bacteremia with haemophilis influenzae. He has been afebrile overnight but starting to trend up with temperatures today to now 101.5. Will repeat blood cultures to ensure he is clearing.  His vent settings are increase slightly with him now requiring 50% FiO2. Precedex has been added this AM for agitation with fentanyl (previous heavy 12 can beer/day drinker with recent cessation per his wife).   His MRSA PCR was negative with current admission.    PLAN: 1. Repeat blood cultures now  2. Continue unasyn for now and follow   Principal Problem:   Haemophilis influenzae bacteremia  Active Problems:   Neck swelling   Laryngeal edema   Cellulitis of neck   Alcohol abuse   Poor dentition   Septic shock (HCC)   Multifocal pneumonia   Airway compromise   Chronic hepatitis C without hepatic coma (HCC)   Acute respiratory failure with hypoxemia (HCC)   Cardiac arrest, cause unspecified (HCC)   Malnutrition of moderate degree   . acetaminophen  650 mg Rectal Once  . chlorhexidine gluconate (MEDLINE KIT)  15 mL Mouth Rinse BID  . Chlorhexidine Gluconate Cloth  6 each Topical Daily  . dexamethasone (DECADRON) injection  10 mg Intravenous Once  . folic acid  1 mg Intravenous Daily  . heparin injection (subcutaneous)  5,000 Units Subcutaneous Q8H  . mouth rinse  15 mL Mouth Rinse 10 times per day  . metoprolol tartrate  25 mg Per Tube BID  . nicotine  21 mg Transdermal Daily  . [START ON 06/15/2019] thiamine  100 mg Per Tube Daily    SUBJECTIVE: Heavily sedated - off pressors and precedex added for agitation.    Review of Systems: Review of Systems    Unable to perform ROS: Intubated    No Active Allergies  OBJECTIVE: Vitals:   06/14/19 1100 06/14/19 1134 06/14/19 1200 06/14/19 1300  BP: 94/66 98/68 97/67  97/67  Pulse: 77 76 77 78  Resp: 18 18 19 18   Temp: (!) 100.8 F (38.2 C)  (!) 101.5 F (38.6 C) (!) 101.8 F (38.8 C)  TempSrc:      SpO2: 100% 100% 99% 100%  Weight:      Height:       Body mass index is 25.44 kg/m.  Physical Exam Constitutional:      Comments: Sedated on ventilator   HENT:     Head: Normocephalic.  Neck:     Comments: Trach in place with dried bloody secretions. Neck edema is improved today.  Cardiovascular:     Rate and Rhythm: Normal rate and regular rhythm.     Heart sounds: No murmur.  Pulmonary:     Effort: Pulmonary effort is normal.     Breath sounds: Examination of the right-upper field reveals rhonchi. Examination of the left-upper field reveals rhonchi. Rhonchi present. No decreased breath sounds.  Chest:     Chest wall: Edema present.     Comments: Erythema extending down to left anterior chest is improved today.  Abdominal:     General: Bowel sounds are normal.  Palpations: Abdomen is soft.  Skin:    General: Skin is warm and dry.     Capillary Refill: Capillary refill takes less than 2 seconds.     Lab Results Lab Results  Component Value Date   WBC 5.2 06/14/2019   HGB 8.1 (L) 06/14/2019   HCT 28.7 (L) 06/14/2019   MCV 82.7 06/14/2019   PLT 247 06/14/2019    Lab Results  Component Value Date   CREATININE 0.67 06/14/2019   BUN 17 06/14/2019   NA 143 06/14/2019   K 3.4 (L) 06/14/2019   CL 109 06/14/2019   CO2 26 06/14/2019    Lab Results  Component Value Date   ALT <5 06/12/2019   AST 63 (H) 06/12/2019   ALKPHOS 48 06/12/2019   BILITOT 0.5 06/12/2019     Microbiology: Recent Results (from the past 240 hour(s))  Culture, blood (routine x 2)     Status: Abnormal (Preliminary result)   Collection Time: 06/12/19  9:09 AM   Specimen: BLOOD  Result Value  Ref Range Status   Specimen Description BLOOD LEFT ANTECUBITAL  Final   Special Requests   Final    BOTTLES DRAWN AEROBIC AND ANAEROBIC Blood Culture adequate volume   Culture  Setup Time   Final    GRAM NEGATIVE RODS IN BOTH AEROBIC AND ANAEROBIC BOTTLES CRITICAL RESULT CALLED TO, READ BACK BY AND VERIFIED WITH: J. FRENS PHARMD, AT 6144 06/13/19 BY D. VANHOOK    Culture (A)  Final    HAEMOPHILUS INFLUENZAE BETA LACTAMASE NEGATIVE Performed at [facility], [place] [address], [place], [place] [address]    Report Status PENDING  Incomplete  Culture, blood (routine x 2)     Status: Abnormal (Preliminary result)   Collection Time: 06/12/19  9:26 AM   Specimen: BLOOD LEFT FOREARM  Result Value Ref Range Status   Specimen Description BLOOD LEFT FOREARM  Final   Special Requests   Final    BOTTLES DRAWN AEROBIC AND ANAEROBIC Blood Culture adequate volume   Culture  Setup Time   Final    GRAM NEGATIVE RODS IN BOTH AEROBIC AND ANAEROBIC BOTTLES CRITICAL RESULT CALLED TO, READ BACK BY AND VERIFIED WITH: J. FRENS PHARMD, AT 0867 06/13/19 BY D. VANHOOK    Culture (A)  Final    HAEMOPHILUS INFLUENZAE BETA LACTAMASE NEGATIVE Performed at [facility], [place] [address], [place], [place] [address]    Report Status PENDING  Incomplete  Blood Culture ID Panel (Reflexed)     Status: Abnormal   Collection Time: 06/12/19  9:26 AM  Result Value Ref Range Status   Enterococcus species NOT DETECTED NOT DETECTED Final   Listeria monocytogenes NOT DETECTED NOT DETECTED Final   Staphylococcus species NOT DETECTED NOT DETECTED Final   Staphylococcus aureus (BCID) NOT DETECTED NOT DETECTED Final   Streptococcus species NOT DETECTED NOT DETECTED Final   Streptococcus agalactiae NOT DETECTED NOT DETECTED Final   Streptococcus pneumoniae NOT DETECTED NOT DETECTED Final   Streptococcus pyogenes NOT DETECTED NOT DETECTED Final   Acinetobacter baumannii NOT DETECTED NOT DETECTED Final    Enterobacteriaceae species NOT DETECTED NOT DETECTED Final   Enterobacter cloacae complex NOT DETECTED NOT DETECTED Final   Escherichia coli NOT DETECTED NOT DETECTED Final   Klebsiella oxytoca NOT DETECTED NOT DETECTED Final   Klebsiella pneumoniae NOT DETECTED NOT DETECTED Final   Proteus species NOT DETECTED NOT DETECTED Final   Serratia marcescens NOT DETECTED NOT DETECTED Final   Haemophilus  influenzae DETECTED (A) NOT DETECTED Final    Comment: CRITICAL RESULT CALLED TO, READ BACK BY AND VERIFIED WITH: J. FRENS PHARMD, AT 1540 06/13/19 BY D. VANHOOK    Neisseria meningitidis NOT DETECTED NOT DETECTED Final   Pseudomonas aeruginosa NOT DETECTED NOT DETECTED Final   Candida albicans NOT DETECTED NOT DETECTED Final   Candida glabrata NOT DETECTED NOT DETECTED Final   Candida krusei NOT DETECTED NOT DETECTED Final   Candida parapsilosis NOT DETECTED NOT DETECTED Final   Candida tropicalis NOT DETECTED NOT DETECTED Final    Comment: Performed at [facility], [place] [address] [place], [place] [address]  Respiratory Panel by RT PCR (Flu A&B, Covid) - Nasopharyngeal Swab     Status: None   Collection Time: 06/12/19 10:05 AM   Specimen: Nasopharyngeal Swab  Result Value Ref Range Status   SARS Coronavirus 2 by RT PCR NEGATIVE NEGATIVE Final    Comment: (NOTE) SARS-CoV-2 target nucleic acids are NOT DETECTED. The SARS-CoV-2 RNA is generally detectable in upper respiratoy specimens during the acute phase of infection. The lowest concentration of SARS-CoV-2 viral copies this assay can detect is 131 copies/mL. A negative result does not preclude SARS-Cov-2 infection and should not be used as the sole basis for treatment or other patient management decisions. A negative result may occur with  improper specimen collection/handling, submission of specimen other than nasopharyngeal swab, presence of viral mutation(s) within the areas targeted by this assay, and inadequate number of viral  copies (<131 copies/mL). A negative result must be combined with clinical observations, patient history, and epidemiological information. The expected result is Negative. Fact Sheet for Patients:  PinkCheek.be Fact Sheet for Healthcare Providers:  GravelBags.it This test is not yet ap proved or cleared by the [place] FDA and  has been authorized for detection and/or diagnosis of SARS-CoV-2 by FDA under an Emergency Use Authorization (EUA). This EUA will remain  in effect (meaning this test can be used) for the duration of the COVID-19 declaration under Section 564(b)(1) of the Act, 21 U.S.C. section 360bbb-3(b)(1), unless the authorization is terminated or revoked sooner.    Influenza A by PCR NEGATIVE NEGATIVE Final   Influenza B by PCR NEGATIVE NEGATIVE Final    Comment: (NOTE) The Xpert Xpress SARS-CoV-2/FLU/RSV assay is intended as an aid in  the diagnosis of influenza from Nasopharyngeal swab specimens and  should not be used as a sole basis for treatment. Nasal washings and  aspirates are unacceptable for Xpert Xpress SARS-CoV-2/FLU/RSV  testing. Fact Sheet for Patients: PinkCheek.be Fact Sheet for Healthcare Providers: GravelBags.it This test is not yet approved or cleared by the [place] FDA and  has been authorized for detection and/or diagnosis of SARS-CoV-2 by  FDA under an Emergency Use Authorization (EUA). This EUA will remain  in effect (meaning this test can be used) for the duration of the  Covid-19 declaration under Section 564(b)(1) of the Act, 21  U.S.C. section 360bbb-3(b)(1), unless the authorization is  terminated or revoked. Performed at [facility], [place] [address] [place], [place] [address]   Urine culture     Status: None   Collection Time: 06/12/19 12:56 PM   Specimen: In/Out Cath Urine  Result Value Ref Range Status    Specimen Description IN/OUT CATH URINE  Final   Special Requests NONE  Final   Culture   Final    NO GROWTH Performed at [facility], [place] [address] [place], [place] [address]  Report Status 06/13/2019 FINAL  Final  Culture, blood (x 2)     Status: None (Preliminary result)   Collection Time: 06/12/19  2:14 PM   Specimen: BLOOD LEFT HAND  Result Value Ref Range Status   Specimen Description BLOOD LEFT HAND  Final   Special Requests   Final    BOTTLES DRAWN AEROBIC ONLY Blood Culture results may not be optimal due to an inadequate volume of blood received in culture bottles   Culture   Final    NO GROWTH < 24 HOURS Performed at [facility], [place] [address] [place], [place] [address]    Report Status PENDING  Incomplete  Culture, blood (x 2)     Status: None (Preliminary result)   Collection Time: 06/12/19  2:48 PM   Specimen: BLOOD  Result Value Ref Range Status   Specimen Description BLOOD BLOOD RIGHT FOREARM  Final   Special Requests   Final    BOTTLES DRAWN AEROBIC ONLY Blood Culture adequate volume   Culture   Final    NO GROWTH < 24 HOURS Performed at [facility], [place] [address], [place], [place] [address]    Report Status PENDING  Incomplete  MRSA PCR Screening     Status: None   Collection Time: 06/12/19  4:58 PM   Specimen: Nasal Mucosa; Nasopharyngeal  Result Value Ref Range Status   MRSA by PCR NEGATIVE NEGATIVE Final    Comment:        The GeneXpert MRSA Assay (FDA approved for NASAL specimens only), is one component of a comprehensive MRSA colonization surveillance program. It is not intended to diagnose MRSA infection nor to guide or monitor treatment for MRSA infections. Performed at [facility], [place] [address], [place], [place] [address]      Janene Madeira, MSN, NP-C [facility] [email or web address] Pager: [phone] Office: [place]: [phone]

## 2019-06-14 NOTE — Progress Notes (Signed)
Unable to apply cooling blanket due to lack of availability of cooling device. Ice applied on patient and Tylenol given. E-link notified.

## 2019-06-14 NOTE — Progress Notes (Addendum)
[facility] Progress Note Patient Name: [name]. DOB: 03/18/1961 MRN: [id number]   Date of Service  06/14/2019  HPI/Events of Note  Change in neuro status - patient no longer follows commands. Sedation has been off since 10 PM.   eICU Interventions  Will observe for another several (3-4) hours off sedation. If not improved will get Head CT Scan w/o contrast STAT.     Intervention Category Major Interventions: Change in mental status - evaluation and management  [name] 06/14/2019, 11:56 PM

## 2019-06-15 DIAGNOSIS — R221 Localized swelling, mass and lump, neck: Secondary | ICD-10-CM

## 2019-06-15 LAB — MAGNESIUM: Magnesium: 1.9 mg/dL (ref 1.7–2.4)

## 2019-06-15 LAB — BASIC METABOLIC PANEL
Anion gap: 9 (ref 5–15)
BUN: 18 mg/dL (ref 6–20)
CO2: 25 mmol/L (ref 22–32)
Calcium: 8.3 mg/dL — ABNORMAL LOW (ref 8.9–10.3)
Chloride: 111 mmol/L (ref 98–111)
Creatinine, Ser: 0.66 mg/dL (ref 0.61–1.24)
GFR calc Af Amer: >60 mL/min (ref >60)
GFR calc non Af Amer: >60 mL/min (ref >60)
Glucose, Bld: 103 mg/dL — ABNORMAL HIGH (ref 70–99)
Potassium: 3.8 mmol/L (ref 3.5–5.1)
Sodium: 145 mmol/L (ref 135–145)

## 2019-06-15 LAB — CULTURE, BLOOD (ROUTINE X 2)
Special Requests: ADEQUATE
Special Requests: ADEQUATE

## 2019-06-15 LAB — CBC
HCT: 28.5 % — ABNORMAL LOW (ref 39.0–52.0)
Hemoglobin: 8 g/dL — ABNORMAL LOW (ref 13.0–17.0)
MCH: 23.3 pg — ABNORMAL LOW (ref 26.0–34.0)
MCHC: 28.1 g/dL — ABNORMAL LOW (ref 30.0–36.0)
MCV: 83.1 fL (ref 80.0–100.0)
Platelets: 213 10*3/uL (ref 150–400)
RBC: 3.43 MIL/uL — ABNORMAL LOW (ref 4.22–5.81)
RDW: 20.6 % — ABNORMAL HIGH (ref 11.5–15.5)
WBC: 6.8 10*3/uL (ref 4.0–10.5)
nRBC: 0 % (ref 0.0–0.2)

## 2019-06-15 LAB — GLUCOSE, CAPILLARY
Glucose-Capillary: 101 mg/dL — ABNORMAL HIGH (ref 70–99)
Glucose-Capillary: 129 mg/dL — ABNORMAL HIGH (ref 70–99)
Glucose-Capillary: 76 mg/dL (ref 70–99)
Glucose-Capillary: 122 mg/dL — ABNORMAL HIGH (ref 70–99)
Glucose-Capillary: 135 mg/dL — ABNORMAL HIGH (ref 70–99)
Glucose-Capillary: 135 mg/dL — ABNORMAL HIGH (ref 70–99)
Glucose-Capillary: 121 mg/dL — ABNORMAL HIGH (ref 70–99)

## 2019-06-15 LAB — PHOSPHORUS: Phosphorus: 3.8 mg/dL (ref 2.5–4.6)

## 2019-06-15 LAB — TRIGLYCERIDES: Triglycerides: 125 mg/dL (ref <150)

## 2019-06-15 MED ORDER — FAMOTIDINE 40 MG/5ML PO SUSR
20.0000 mg | Freq: Two times a day (BID) | ORAL | Status: DC
  Administered 2019-06-15 – 2019-07-28 (×85): 20 mg
  Filled 2019-06-15 (×92): qty 2.5

## 2019-06-15 MED ORDER — QUETIAPINE FUMARATE 50 MG PO TABS
25.0000 mg | ORAL_TABLET | Freq: Two times a day (BID) | ORAL | Status: DC
  Administered 2019-06-15 – 2019-06-18 (×7): 25 mg
  Filled 2019-06-15 (×7): qty 1

## 2019-06-15 MED ORDER — POTASSIUM CHLORIDE 10 MEQ/100ML IV SOLN
10.0000 meq | INTRAVENOUS | Status: AC
  Administered 2019-06-15 (×2): 10 meq via INTRAVENOUS
  Filled 2019-06-15 (×2): qty 100

## 2019-06-15 MED ORDER — MAGNESIUM SULFATE IN D5W 1-5 GM/100ML-% IV SOLN
1.0000 g | Freq: Once | INTRAVENOUS | Status: AC
  Administered 2019-06-15: 1 g via INTRAVENOUS
  Filled 2019-06-15: qty 100

## 2019-06-15 MED ORDER — FAMOTIDINE 40 MG/5ML PO SUSR: 20.0000 mg | Freq: Two times a day (BID) | ORAL | Status: DC
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
## 2019-06-15 NOTE — Progress Notes (Signed)
Unable to draw blood for AM labs from [facility]. All three lumens flush without any issues. RN notified phlebotomy and will attempt to draw.

## 2019-06-15 NOTE — Progress Notes (Signed)
[facility] Infectious Disease  Date of Admission:  06/12/2019      Total days of antibiotics 3   Day 2 unasyn            ASSESSMENT: [name]. is a 59 y.o. male with deep infection of his neck with rapid progression of swelling to include airway collapse requiring emergency tracheostomy and secondary bacteremia with haemophilis influenzae. He was febrile to 103 last night but overall trend is improving. He is not tachycardic and remains normotensive. Repeat blood cultures so far are negative from 1/06 with final read pending. On exam his neck is much softer with improvement in the erythema overlying chest. He does have some possible remaining firmness to the left neck.  Would continue to treat with unasyn to cover neck infection and bacteremia.  If he were to continue to fever would recommend consideration of repeating CT scan to re-assess for possible abscess formation.   His MRSA PCR was negative with current admission.    PLAN: 1. Follow micro data and exam.  2. Continue unasyn for now   Principal Problem:   Haemophilis influenzae bacteremia  Active Problems:   Neck swelling   Laryngeal edema   Cellulitis of neck   Alcohol abuse   Poor dentition   Septic shock (HCC)   Multifocal pneumonia   Airway compromise   Chronic hepatitis C without hepatic coma (HCC)   Acute respiratory failure with hypoxemia (HCC)   Cardiac arrest, cause unspecified (HCC)   Malnutrition of moderate degree   . chlorhexidine gluconate (MEDLINE KIT)  15 mL Mouth Rinse BID  . Chlorhexidine Gluconate Cloth  6 each Topical Daily  . dexamethasone (DECADRON) injection  10 mg Intravenous Once  . famotidine  20 mg Per Tube BID  . folic acid  1 mg Intravenous Daily  . heparin injection (subcutaneous)  5,000 Units Subcutaneous Q8H  . mouth rinse  15 mL Mouth Rinse 10 times per day  . metoprolol tartrate  25 mg Per Tube BID  . nicotine  21 mg Transdermal Daily  . thiamine   100 mg Per Tube Daily    SUBJECTIVE: Sedated and on ventilator.    Review of Systems: Review of Systems  Unable to perform ROS: Intubated    No Active Allergies  OBJECTIVE: Vitals:   06/15/19 0500 06/15/19 0600 06/15/19 0700 06/15/19 0800  BP: 114/71 116/72 115/71 114/73  Pulse: 78 76 76   Resp: 18 19 19    Temp: (!) 101.1 F (38.4 C) (!) 100.9 F (38.3 C) (!) 100.8 F (38.2 C)   TempSrc:      SpO2: 98% 98% 98%   Weight:      Height:       Body mass index is 25.51 kg/m.  Physical Exam Constitutional:      Comments: Sedated on ventilator   HENT:     Head: Normocephalic.  Neck:     Comments: Trach in place. Neck is much softer on exam with some remaining firmness overlying left neck. Cardiovascular:     Rate and Rhythm: Normal rate and regular rhythm.     Heart sounds: No murmur.  Pulmonary:     Effort: Pulmonary effort is normal.     Breath sounds: No decreased breath sounds or rhonchi.  Chest:     Chest wall: No edema.     Comments: Erythema has mostly resolved overlying chest Abdominal:     General: Bowel  sounds are normal.     Palpations: Abdomen is soft.  Skin:    General: Skin is warm and dry.     Capillary Refill: Capillary refill takes less than 2 seconds.     Lab Results Lab Results  Component Value Date   WBC 5.2 06/14/2019   HGB 8.1 (L) 06/14/2019   HCT 28.7 (L) 06/14/2019   MCV 82.7 06/14/2019   PLT 247 06/14/2019    Lab Results  Component Value Date   CREATININE 0.66 06/15/2019   BUN 18 06/15/2019   NA 145 06/15/2019   K 3.8 06/15/2019   CL 111 06/15/2019   CO2 25 06/15/2019    Lab Results  Component Value Date   ALT <5 06/12/2019   AST 63 (H) 06/12/2019   ALKPHOS 48 06/12/2019   BILITOT 0.5 06/12/2019     Microbiology: Recent Results (from the past 240 hour(s))  Culture, blood (routine x 2)     Status: Abnormal   Collection Time: 06/12/19  9:09 AM   Specimen: BLOOD  Result Value Ref Range Status   Specimen Description  BLOOD LEFT ANTECUBITAL  Final   Special Requests   Final    BOTTLES DRAWN AEROBIC AND ANAEROBIC Blood Culture adequate volume   Culture  Setup Time   Final    GRAM NEGATIVE RODS IN BOTH AEROBIC AND ANAEROBIC BOTTLES CRITICAL RESULT CALLED TO, READ BACK BY AND VERIFIED WITH: J. FRENS PHARMD, AT 2952 06/13/19 BY D. VANHOOK    Culture (A)  Final    HAEMOPHILUS INFLUENZAE BETA LACTAMASE NEGATIVE Performed at [facility], [place] [address] [place], [place] [address]    Report Status 06/15/2019 FINAL  Final  Culture, blood (routine x 2)     Status: Abnormal   Collection Time: 06/12/19  9:26 AM   Specimen: BLOOD LEFT FOREARM  Result Value Ref Range Status   Specimen Description BLOOD LEFT FOREARM  Final   Special Requests   Final    BOTTLES DRAWN AEROBIC AND ANAEROBIC Blood Culture adequate volume   Culture  Setup Time   Final    GRAM NEGATIVE RODS IN BOTH AEROBIC AND ANAEROBIC BOTTLES CRITICAL RESULT CALLED TO, READ BACK BY AND VERIFIED WITH: J. FRENS PHARMD, AT 4401 06/13/19 BY D. VANHOOK    Culture HAEMOPHILUS INFLUENZAE BETA LACTAMASE NEGATIVE  (A)  Final   Report Status 06/15/2019 FINAL  Final  Blood Culture ID Panel (Reflexed)     Status: Abnormal   Collection Time: 06/12/19  9:26 AM  Result Value Ref Range Status   Enterococcus species NOT DETECTED NOT DETECTED Final   Listeria monocytogenes NOT DETECTED NOT DETECTED Final   Staphylococcus species NOT DETECTED NOT DETECTED Final   Staphylococcus aureus (BCID) NOT DETECTED NOT DETECTED Final   Streptococcus species NOT DETECTED NOT DETECTED Final   Streptococcus agalactiae NOT DETECTED NOT DETECTED Final   Streptococcus pneumoniae NOT DETECTED NOT DETECTED Final   Streptococcus pyogenes NOT DETECTED NOT DETECTED Final   Acinetobacter baumannii NOT DETECTED NOT DETECTED Final   Enterobacteriaceae species NOT DETECTED NOT DETECTED Final   Enterobacter cloacae complex NOT DETECTED NOT DETECTED Final   Escherichia coli NOT  DETECTED NOT DETECTED Final   Klebsiella oxytoca NOT DETECTED NOT DETECTED Final   Klebsiella pneumoniae NOT DETECTED NOT DETECTED Final   Proteus species NOT DETECTED NOT DETECTED Final   Serratia marcescens NOT DETECTED NOT DETECTED Final   Haemophilus influenzae DETECTED (A) NOT DETECTED Final    Comment: CRITICAL RESULT  CALLED TO, READ BACK BY AND VERIFIED WITH: J. FRENS PHARMD, AT 3810 06/13/19 BY D. VANHOOK    Neisseria meningitidis NOT DETECTED NOT DETECTED Final   Pseudomonas aeruginosa NOT DETECTED NOT DETECTED Final   Candida albicans NOT DETECTED NOT DETECTED Final   Candida glabrata NOT DETECTED NOT DETECTED Final   Candida krusei NOT DETECTED NOT DETECTED Final   Candida parapsilosis NOT DETECTED NOT DETECTED Final   Candida tropicalis NOT DETECTED NOT DETECTED Final    Comment: Performed at [facility], [place] [address], [place], [place] [address]  Respiratory Panel by RT PCR (Flu A&B, Covid) - Nasopharyngeal Swab     Status: None   Collection Time: 06/12/19 10:05 AM   Specimen: Nasopharyngeal Swab  Result Value Ref Range Status   SARS Coronavirus 2 by RT PCR NEGATIVE NEGATIVE Final    Comment: (NOTE) SARS-CoV-2 target nucleic acids are NOT DETECTED. The SARS-CoV-2 RNA is generally detectable in upper respiratoy specimens during the acute phase of infection. The lowest concentration of SARS-CoV-2 viral copies this assay can detect is 131 copies/mL. A negative result does not preclude SARS-Cov-2 infection and should not be used as the sole basis for treatment or other patient management decisions. A negative result may occur with  improper specimen collection/handling, submission of specimen other than nasopharyngeal swab, presence of viral mutation(s) within the areas targeted by this assay, and inadequate number of viral copies (<131 copies/mL). A negative result must be combined with clinical observations, patient history, and epidemiological information.  The expected result is Negative. Fact Sheet for Patients:  PinkCheek.be Fact Sheet for Healthcare Providers:  GravelBags.it This test is not yet ap proved or cleared by the [place] FDA and  has been authorized for detection and/or diagnosis of SARS-CoV-2 by FDA under an Emergency Use Authorization (EUA). This EUA will remain  in effect (meaning this test can be used) for the duration of the COVID-19 declaration under Section 564(b)(1) of the Act, 21 U.S.C. section 360bbb-3(b)(1), unless the authorization is terminated or revoked sooner.    Influenza A by PCR NEGATIVE NEGATIVE Final   Influenza B by PCR NEGATIVE NEGATIVE Final    Comment: (NOTE) The Xpert Xpress SARS-CoV-2/FLU/RSV assay is intended as an aid in  the diagnosis of influenza from Nasopharyngeal swab specimens and  should not be used as a sole basis for treatment. Nasal washings and  aspirates are unacceptable for Xpert Xpress SARS-CoV-2/FLU/RSV  testing. Fact Sheet for Patients: PinkCheek.be Fact Sheet for Healthcare Providers: GravelBags.it This test is not yet approved or cleared by the [place] FDA and  has been authorized for detection and/or diagnosis of SARS-CoV-2 by  FDA under an Emergency Use Authorization (EUA). This EUA will remain  in effect (meaning this test can be used) for the duration of the  Covid-19 declaration under Section 564(b)(1) of the Act, 21  U.S.C. section 360bbb-3(b)(1), unless the authorization is  terminated or revoked. Performed at [facility], [place] [address] [place], [place] [address]   Urine culture     Status: None   Collection Time: 06/12/19 12:56 PM   Specimen: In/Out Cath Urine  Result Value Ref Range Status   Specimen Description IN/OUT CATH URINE  Final   Special Requests NONE  Final   Culture   Final    NO GROWTH Performed at [facility], [place] [address] [place], [place] [address]    Report Status 06/13/2019 FINAL  Final  Culture, blood (x  2)     Status: None (Preliminary result)   Collection Time: 06/12/19  2:14 PM   Specimen: BLOOD LEFT HAND  Result Value Ref Range Status   Specimen Description BLOOD LEFT HAND  Final   Special Requests   Final    BOTTLES DRAWN AEROBIC ONLY Blood Culture results may not be optimal due to an inadequate volume of blood received in culture bottles   Culture   Final    NO GROWTH 2 DAYS Performed at [facility], [place] [address] [place], [place] [address]    Report Status PENDING  Incomplete  Culture, blood (x 2)     Status: None (Preliminary result)   Collection Time: 06/12/19  2:48 PM   Specimen: BLOOD  Result Value Ref Range Status   Specimen Description BLOOD BLOOD RIGHT FOREARM  Final   Special Requests   Final    BOTTLES DRAWN AEROBIC ONLY Blood Culture adequate volume   Culture   Final    NO GROWTH 2 DAYS Performed at [facility], [place] [address], [place], [place] [address]    Report Status PENDING  Incomplete  MRSA PCR Screening     Status: None   Collection Time: 06/12/19  4:58 PM   Specimen: Nasal Mucosa; Nasopharyngeal  Result Value Ref Range Status   MRSA by PCR NEGATIVE NEGATIVE Final    Comment:        The GeneXpert MRSA Assay (FDA approved for NASAL specimens only), is one component of a comprehensive MRSA colonization surveillance program. It is not intended to diagnose MRSA infection nor to guide or monitor treatment for MRSA infections. Performed at [facility], [place] [address] [place], [place] [address]      Janene Madeira, MSN, NP-C [facility] Infect[facility] Disease [email or web address] Pager: [phone] Office: [phone] [place]: [phone]

## 2019-06-16 ENCOUNTER — Inpatient Hospital Stay (HOSPITAL_COMMUNITY): Payer: Medicaid Other

## 2019-06-16 LAB — BASIC METABOLIC PANEL
Anion gap: 8 (ref 5–15)
BUN: 15 mg/dL (ref 6–20)
CO2: 25 mmol/L (ref 22–32)
Calcium: 8 mg/dL — ABNORMAL LOW (ref 8.9–10.3)
Chloride: 112 mmol/L — ABNORMAL HIGH (ref 98–111)
Creatinine, Ser: 0.78 mg/dL (ref 0.61–1.24)
GFR calc Af Amer: >60 mL/min (ref >60)
GFR calc non Af Amer: >60 mL/min (ref >60)
Glucose, Bld: 120 mg/dL — ABNORMAL HIGH (ref 70–99)
Potassium: 3.9 mmol/L (ref 3.5–5.1)
Sodium: 145 mmol/L (ref 135–145)

## 2019-06-16 LAB — CBC
HCT: 27.9 % — ABNORMAL LOW (ref 39.0–52.0)
Hemoglobin: 8.1 g/dL — ABNORMAL LOW (ref 13.0–17.0)
MCH: 23.5 pg — ABNORMAL LOW (ref 26.0–34.0)
MCHC: 29 g/dL — ABNORMAL LOW (ref 30.0–36.0)
MCV: 80.9 fL (ref 80.0–100.0)
Platelets: 218 10*3/uL (ref 150–400)
RBC: 3.45 MIL/uL — ABNORMAL LOW (ref 4.22–5.81)
RDW: 20.3 % — ABNORMAL HIGH (ref 11.5–15.5)
WBC: 5.8 10*3/uL (ref 4.0–10.5)
nRBC: 0 % (ref 0.0–0.2)

## 2019-06-16 LAB — TRIGLYCERIDES: Triglycerides: 108 mg/dL (ref <150)

## 2019-06-16 LAB — GLUCOSE, CAPILLARY
Glucose-Capillary: 116 mg/dL — ABNORMAL HIGH (ref 70–99)
Glucose-Capillary: 111 mg/dL — ABNORMAL HIGH (ref 70–99)
Glucose-Capillary: 131 mg/dL — ABNORMAL HIGH (ref 70–99)
Glucose-Capillary: 124 mg/dL — ABNORMAL HIGH (ref 70–99)
Glucose-Capillary: 118 mg/dL — ABNORMAL HIGH (ref 70–99)

## 2019-06-16 LAB — MAGNESIUM: Magnesium: 1.9 mg/dL (ref 1.7–2.4)

## 2019-06-16 LAB — PHOSPHORUS: Phosphorus: 3.2 mg/dL (ref 2.5–4.6)

## 2019-06-16 MED ORDER — CLONIDINE HCL 0.3 MG PO TABS: 0.3000 mg | ORAL_TABLET | Freq: Four times a day (QID) | ORAL | Status: DC

## 2019-06-16 MED ORDER — FUROSEMIDE 10 MG/ML IJ SOLN
40.0000 mg | Freq: Once | INTRAMUSCULAR | Status: AC
  Administered 2019-06-16: 11:00:00 40 mg via INTRAVENOUS
  Filled 2019-06-16: qty 4

## 2019-06-16 MED ORDER — MAGNESIUM SULFATE IN D5W 1-5 GM/100ML-% IV SOLN
1.0000 g | Freq: Once | INTRAVENOUS | Status: AC
  Administered 2019-06-16: 1 g via INTRAVENOUS
  Filled 2019-06-16: qty 100

## 2019-06-16 MED ORDER — CLONAZEPAM 0.5 MG PO TABS: 0.2500 mg | ORAL_TABLET | Freq: Two times a day (BID) | ORAL | Status: DC

## 2019-06-16 MED ORDER — FUROSEMIDE 10 MG/ML IJ SOLN
40.0000 mg | Freq: Once | INTRAMUSCULAR | Status: AC
  Administered 2019-06-16: 05:00:00 40 mg via INTRAVENOUS
  Filled 2019-06-16: qty 4

## 2019-06-16 MED ORDER — CLONIDINE HCL 0.3 MG PO TABS
0.3000 mg | ORAL_TABLET | Freq: Four times a day (QID) | ORAL | Status: DC
  Administered 2019-06-16 – 2019-06-28 (×48): 0.3 mg
  Filled 2019-06-16 (×50): qty 1

## 2019-06-16 MED ORDER — CLONAZEPAM 0.5 MG PO TBDP
0.5000 mg | ORAL_TABLET | Freq: Two times a day (BID) | ORAL | Status: DC
  Administered 2019-06-16 – 2019-06-21 (×11): 0.5 mg
  Filled 2019-06-16 (×11): qty 1

## 2019-06-16 MED ORDER — CHLORHEXIDINE GLUCONATE 0.12 % MT SOLN
OROMUCOSAL | Status: AC
  Filled 2019-06-16: qty 15

## 2019-06-16 MED ORDER — FUROSEMIDE 10 MG/ML IJ SOLN: 40.0000 mg | Freq: Two times a day (BID) | INTRAMUSCULAR | Status: DC

## 2019-06-16 MED ORDER — CLONAZEPAM 0.25 MG PO TBDP: 0.2500 mg | ORAL_TABLET | Freq: Two times a day (BID) | ORAL | Status: DC

## 2019-06-16 MED FILL — Sodium Chloride IV Soln 0.9%: INTRAVENOUS | Qty: 250 | Status: AC

## 2019-06-16 MED FILL — Fentanyl Citrate Preservative Free (PF) Inj 2500 MCG/50ML: INTRAMUSCULAR | Qty: 50 | Status: AC

## 2019-06-16 NOTE — Progress Notes (Signed)
[name] Progress Note Patient Name: [name]. DOB: [birth year] MRN: [id number]   Date of Service  06/16/2019  HPI/Events of Note  Oliguria - CVP = 19. I/O + 9950.8 since admission. Creatinine = 0.78. Sat = 91%  eICU Interventions  Will order: 1. Lasix 40 mg IV X 1 now. 2. Portable CXR at 8 AM.     Intervention Category Major Interventions: Other:  Coden Franchi Cornelia Copa 06/16/2019, 5:10 AM
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
## 2019-06-16 NOTE — Progress Notes (Signed)
[facility]  Date of Admission:  06/12/2019      Total days of antibiotics 5  Day 4 unasyn            ASSESSMENT: [name]. is a 59 y.o. male with deep infection of his neck with rapid progression of swelling to include airway collapse requiring emergency tracheostomy and secondary bacteremia with haemophilis influenzae.   His neck cellulitis has improved significantly and he is now on trach collar. He has had persistent fever ~101 that is continuously being monitored. He does not feel quite as warm to me as this implies on exam.  Would continue antipyretics for treatment of fever but no signs of worsening or new bacterial infection at this point. Would continue current treatment with unasyn for neck infection as he has had significant improvement. ENT has seen and planing scope after swelling resolves more.   His dentition is very poor and multiple necrotic teeth, fully/partially missing teeth with erythematous gums. No grimacing palpating over jaw or gumline.   His MRSA PCR was negative with current admission.    PLAN: Continue unasyn   Principal Problem:   Neck infection Active Problems:   Laryngeal edema   Multifocal pneumonia   Haemophilis influenzae bacteremia    Alcohol abuse   Poor dentition   Septic shock (HCC)   Airway compromise   Chronic hepatitis C without hepatic coma (HCC)   Acute respiratory failure with hypoxemia (HCC)   Cardiac arrest, cause unspecified (HCC)   Malnutrition of moderate degree   . chlorhexidine gluconate (MEDLINE KIT)  15 mL Mouth Rinse BID  . Chlorhexidine Gluconate Cloth  6 each Topical Daily  . clonazepam  0.5 mg Per Tube BID  . cloNIDine  0.3 mg Per Tube Q6H  . famotidine  20 mg Per Tube BID  . folic acid  1 mg Intravenous Daily  . heparin injection (subcutaneous)  5,000 Units Subcutaneous Q8H  . mouth rinse  15 mL Mouth Rinse 10 times per day  . metoprolol tartrate  25 mg Per Tube  BID  . nicotine  21 mg Transdermal Daily  . QUEtiapine  25 mg Per Tube BID  . thiamine  100 mg Per Tube Daily    SUBJECTIVE: Sedated. Grimaces with tactile stimulation. Trach collar today.    Review of Systems: Review of Systems  Unable to perform ROS: Intubated    No Active Allergies  OBJECTIVE: Vitals:   06/16/19 1000 06/16/19 1100 06/16/19 1145 06/16/19 1200  BP: 127/72 (!) 145/77 135/71 115/64  Pulse: (!) 102 (!) 117  (!) 107  Resp: (!) 22 (!) 24  (!) 21  Temp: (!) 101.1 F (38.4 C) (!) 101.1 F (38.4 C)  (!) 100.9 F (38.3 C)  TempSrc:      SpO2: 97% 96%  94%  Weight:      Height:       Body mass index is 27.52 kg/m.  Physical Exam Constitutional:      Comments: Sedated on ventilator   HENT:     Head: Normocephalic.  Neck:     Comments: Trach in place. No fluctuance overlying neck or chest wall. Cellulitis appears to be improving Cardiovascular:     Rate and Rhythm: Normal rate and regular rhythm.     Heart sounds: No murmur.  Pulmonary:     Effort: Pulmonary effort is normal.     Breath sounds: Rhonchi present. No decreased breath  sounds.  Chest:     Chest wall: No edema.     Comments: Erythema has mostly resolved overlying chest Abdominal:     General: Bowel sounds are normal.     Palpations: Abdomen is soft.  Skin:    General: Skin is warm and dry.     Capillary Refill: Capillary refill takes less than 2 seconds.     Lab Results Lab Results  Component Value Date   WBC 5.8 06/16/2019   HGB 8.1 (L) 06/16/2019   HCT 27.9 (L) 06/16/2019   MCV 80.9 06/16/2019   PLT 218 06/16/2019    Lab Results  Component Value Date   CREATININE 0.78 06/16/2019   BUN 15 06/16/2019   NA 145 06/16/2019   K 3.9 06/16/2019   CL 112 (H) 06/16/2019   CO2 25 06/16/2019    Lab Results  Component Value Date   ALT <5 06/12/2019   AST 63 (H) 06/12/2019   ALKPHOS 48 06/12/2019   BILITOT 0.5 06/12/2019     Microbiology: Recent Results (from the past 240  hour(s))  Culture, blood (routine x 2)     Status: Abnormal   Collection Time: 06/12/19  9:09 AM   Specimen: BLOOD  Result Value Ref Range Status   Specimen Description BLOOD LEFT ANTECUBITAL  Final   Special Requests   Final    BOTTLES DRAWN AEROBIC AND ANAEROBIC Blood Culture adequate volume   Culture  Setup Time   Final    GRAM NEGATIVE RODS IN BOTH AEROBIC AND ANAEROBIC BOTTLES CRITICAL RESULT CALLED TO, READ BACK BY AND VERIFIED WITH: J. FRENS PHARMD, AT 0086 06/13/19 BY D. VANHOOK    Culture (A)  Final    HAEMOPHILUS INFLUENZAE BETA LACTAMASE NEGATIVE Performed at [facility], [place] [address], [place], [place] [address]    Report Status 06/15/2019 FINAL  Final  Culture, blood (routine x 2)     Status: Abnormal   Collection Time: 06/12/19  9:26 AM   Specimen: BLOOD LEFT FOREARM  Result Value Ref Range Status   Specimen Description BLOOD LEFT FOREARM  Final   Special Requests   Final    BOTTLES DRAWN AEROBIC AND ANAEROBIC Blood Culture adequate volume   Culture  Setup Time   Final    GRAM NEGATIVE RODS IN BOTH AEROBIC AND ANAEROBIC BOTTLES CRITICAL RESULT CALLED TO, READ BACK BY AND VERIFIED WITH: J. FRENS PHARMD, AT 0932 06/13/19 BY D. VANHOOK    Culture HAEMOPHILUS INFLUENZAE BETA LACTAMASE NEGATIVE  (A)  Final   Report Status 06/15/2019 FINAL  Final  Blood Culture ID Panel (Reflexed)     Status: Abnormal   Collection Time: 06/12/19  9:26 AM  Result Value Ref Range Status   Enterococcus species NOT DETECTED NOT DETECTED Final   Listeria monocytogenes NOT DETECTED NOT DETECTED Final   Staphylococcus species NOT DETECTED NOT DETECTED Final   Staphylococcus aureus (BCID) NOT DETECTED NOT DETECTED Final   Streptococcus species NOT DETECTED NOT DETECTED Final   Streptococcus agalactiae NOT DETECTED NOT DETECTED Final   Streptococcus pneumoniae NOT DETECTED NOT DETECTED Final   Streptococcus pyogenes NOT DETECTED NOT DETECTED Final   Acinetobacter baumannii NOT  DETECTED NOT DETECTED Final   Enterobacteriaceae species NOT DETECTED NOT DETECTED Final   Enterobacter cloacae complex NOT DETECTED NOT DETECTED Final   Escherichia coli NOT DETECTED NOT DETECTED Final   Klebsiella oxytoca NOT DETECTED NOT DETECTED Final   Klebsiella pneumoniae NOT DETECTED NOT DETECTED Final   Proteus  species NOT DETECTED NOT DETECTED Final   Serratia marcescens NOT DETECTED NOT DETECTED Final   Haemophilus influenzae DETECTED (A) NOT DETECTED Final    Comment: CRITICAL RESULT CALLED TO, READ BACK BY AND VERIFIED WITH: J. FRENS PHARMD, AT 1761 06/13/19 BY D. VANHOOK    Neisseria meningitidis NOT DETECTED NOT DETECTED Final   Pseudomonas aeruginosa NOT DETECTED NOT DETECTED Final   Candida albicans NOT DETECTED NOT DETECTED Final   Candida glabrata NOT DETECTED NOT DETECTED Final   Candida krusei NOT DETECTED NOT DETECTED Final   Candida parapsilosis NOT DETECTED NOT DETECTED Final   Candida tropicalis NOT DETECTED NOT DETECTED Final    Comment: Performed at [facility], [place] [address] [place], [place] [address]  Respiratory Panel by RT PCR (Flu A&B, Covid) - Nasopharyngeal Swab     Status: None   Collection Time: 06/12/19 10:05 AM   Specimen: Nasopharyngeal Swab  Result Value Ref Range Status   SARS Coronavirus 2 by RT PCR NEGATIVE NEGATIVE Final    Comment: (NOTE) SARS-CoV-2 target nucleic acids are NOT DETECTED. The SARS-CoV-2 RNA is generally detectable in upper respiratoy specimens during the acute phase of infection. The lowest concentration of SARS-CoV-2 viral copies this assay can detect is 131 copies/mL. A negative result does not preclude SARS-Cov-2 infection and should not be used as the sole basis for treatment or other patient management decisions. A negative result may occur with  improper specimen collection/handling, submission of specimen other than nasopharyngeal swab, presence of viral mutation(s) within the areas targeted by this assay,  and inadequate number of viral copies (<131 copies/mL). A negative result must be combined with clinical observations, patient history, and epidemiological information. The expected result is Negative. Fact Sheet for Patients:  PinkCheek.be Fact Sheet for Healthcare Providers:  GravelBags.it This test is not yet ap proved or cleared by the [place] FDA and  has been authorized for detection and/or diagnosis of SARS-CoV-2 by FDA under an Emergency Use Authorization (EUA). This EUA will remain  in effect (meaning this test can be used) for the duration of the COVID-19 declaration under Section 564(b)(1) of the Act, 21 U.S.C. section 360bbb-3(b)(1), unless the authorization is terminated or revoked sooner.    Influenza A by PCR NEGATIVE NEGATIVE Final   Influenza B by PCR NEGATIVE NEGATIVE Final    Comment: (NOTE) The Xpert Xpress SARS-CoV-2/FLU/RSV assay is intended as an aid in  the diagnosis of influenza from Nasopharyngeal swab specimens and  should not be used as a sole basis for treatment. Nasal washings and  aspirates are unacceptable for Xpert Xpress SARS-CoV-2/FLU/RSV  testing. Fact Sheet for Patients: PinkCheek.be Fact Sheet for Healthcare Providers: GravelBags.it This test is not yet approved or cleared by the [place] FDA and  has been authorized for detection and/or diagnosis of SARS-CoV-2 by  FDA under an Emergency Use Authorization (EUA). This EUA will remain  in effect (meaning this test can be used) for the duration of the  Covid-19 declaration under Section 564(b)(1) of the Act, 21  U.S.C. section 360bbb-3(b)(1), unless the authorization is  terminated or revoked. Performed at [facility], [place] [address], [place], [place] [address]   Urine culture     Status: None   Collection Time: 06/12/19 12:56 PM   Specimen: In/Out Cath Urine    Result Value Ref Range Status   Specimen Description IN/OUT CATH URINE  Final   Special Requests NONE  Final   Culture   Final  NO GROWTH Performed at [facility], [place] [address] [place], [place] [address]    Report Status 06/13/2019 FINAL  Final  Culture, blood (x 2)     Status: None (Preliminary result)   Collection Time: 06/12/19  2:14 PM   Specimen: BLOOD LEFT HAND  Result Value Ref Range Status   Specimen Description BLOOD LEFT HAND  Final   Special Requests   Final    BOTTLES DRAWN AEROBIC ONLY Blood Culture results may not be optimal due to an inadequate volume of blood received in culture bottles   Culture   Final    NO GROWTH 4 DAYS Performed at [facility], [place] [address], [place], [place] [address]    Report Status PENDING  Incomplete  Culture, blood (x 2)     Status: None (Preliminary result)   Collection Time: 06/12/19  2:48 PM   Specimen: BLOOD  Result Value Ref Range Status   Specimen Description BLOOD BLOOD RIGHT FOREARM  Final   Special Requests   Final    BOTTLES DRAWN AEROBIC ONLY Blood Culture adequate volume   Culture   Final    NO GROWTH 4 DAYS Performed at [facility], [place] [address], [place], [place] [address]    Report Status PENDING  Incomplete  MRSA PCR Screening     Status: None   Collection Time: 06/12/19  4:58 PM   Specimen: Nasal Mucosa; Nasopharyngeal  Result Value Ref Range Status   MRSA by PCR NEGATIVE NEGATIVE Final    Comment:        The GeneXpert MRSA Assay (FDA approved for NASAL specimens only), is one component of a comprehensive MRSA colonization surveillance program. It is not intended to diagnose MRSA infection nor to guide or monitor treatment for MRSA infections. Performed at [facility], [place] [address], [place], [place] [address]   Culture, blood (routine x 2)     Status: None (Preliminary result)   Collection Time: 06/14/19  3:22 PM   Specimen: BLOOD  Result Value Ref Range Status    Specimen Description BLOOD RIGHT ANTECUBITAL  Final   Special Requests   Final    BOTTLES DRAWN AEROBIC ONLY Blood Culture adequate volume   Culture   Final    NO GROWTH 2 DAYS Performed at [facility], [place] [address] [place], [place] [address]    Report Status PENDING  Incomplete  Culture, blood (routine x 2)     Status: None (Preliminary result)   Collection Time: 06/14/19  3:27 PM   Specimen: BLOOD LEFT HAND  Result Value Ref Range Status   Specimen Description BLOOD LEFT HAND  Final   Special Requests   Final    BOTTLES DRAWN AEROBIC ONLY Blood Culture results may not be optimal due to an excessive volume of blood received in culture bottles   Culture   Final    NO GROWTH 2 DAYS Performed at [facility], [place] [address] [place], [place] [address]    Report Status PENDING  Incomplete     Janene Madeira, MSN, NP-C [facility] Disease [email or web address] Pager: [phone] Office: [phone] [place]: [phone]

## 2019-06-16 NOTE — Progress Notes (Signed)
ENT Consult Progress Note  Subjective:  Off neo. BP improving. Moving all 4 extremities.  More awake and alert. Tolerating tube feeds  Erythema/cellulitis improving.  Decreased erythema and swelling.  Trach collar ties had to be tightened due to improvement in swelling.  Patient still sedated, on vent.  Weaning sedation.  Objective: Vitals:   06/16/19 0800 06/16/19 0900  BP: (!) 160/84 139/79  Pulse: (!) 132 (!) 115  Resp: (!) 31 19  Temp: (!) 101.5 F (38.6 C) (!) 101.3 F (38.5 C)  SpO2: 93% 98%    Physical Exam: CONSTITUTIONAL: well developed, nourished, no distress and alert and oriented x 3 CARDIOVASCULAR: normal rate and regular rhythm  Neck & CHEST WALL:  Erythema pitting at base of neck is reduced in its expansion, no fluctuance. Left chest wall with improved erythema, as noted by its previous ink markings.  There is nothing fluctuant.   EYES: conjunctiva normal, EOM normal and PERRL  Trach in place, 6 DCT Vent Mode: CPAP;PSV FiO2 (%):  [30 %-40 %] 40 % Set Rate:  [18 bmp] 18 bmp Vt Set:  [540 mL] 540 mL PEEP:  [5 cmH20] 5 cmH20 Pressure Support:  [12 cmH20-15 cmH20] 15 cmH20 Plateau Pressure:  [22 cmH20-24 cmH20] 23 cmH20    Data Review/Consults/Discussions: none  Assessment: Kaidin Rollen Sox. 59 y.o. male who presents with neck cellulitis, airway compromise.  Cellulitis seems to be clinically improving   Plan: Antibiotics per ID Trach management per CCM/ICU Will follow. Will perform TFL scope examination when swelling is improved and patient can sit up better.    Thank you for involving [facility] in the care of this patient. Should you need further assistance, please call our office at [phone].    Gavin Pound, MD

## 2019-06-17 DIAGNOSIS — A492 Hemophilus influenzae infection, unspecified site: Secondary | ICD-10-CM

## 2019-06-17 DIAGNOSIS — F10231 Alcohol dependence with withdrawal delirium: Secondary | ICD-10-CM

## 2019-06-17 DIAGNOSIS — K122 Cellulitis and abscess of mouth: Secondary | ICD-10-CM

## 2019-06-17 LAB — CBC
HCT: 31.6 % — ABNORMAL LOW (ref 39.0–52.0)
Hemoglobin: 8.9 g/dL — ABNORMAL LOW (ref 13.0–17.0)
MCH: 23.2 pg — ABNORMAL LOW (ref 26.0–34.0)
MCHC: 28.2 g/dL — ABNORMAL LOW (ref 30.0–36.0)
MCV: 82.5 fL (ref 80.0–100.0)
Platelets: 210 10*3/uL (ref 150–400)
RBC: 3.83 MIL/uL — ABNORMAL LOW (ref 4.22–5.81)
RDW: 20.3 % — ABNORMAL HIGH (ref 11.5–15.5)
WBC: 4.3 10*3/uL (ref 4.0–10.5)
nRBC: 0 % (ref 0.0–0.2)

## 2019-06-17 LAB — BASIC METABOLIC PANEL
Anion gap: 7 (ref 5–15)
BUN: 12 mg/dL (ref 6–20)
CO2: 28 mmol/L (ref 22–32)
Calcium: 7.8 mg/dL — ABNORMAL LOW (ref 8.9–10.3)
Chloride: 110 mmol/L (ref 98–111)
Creatinine, Ser: 0.76 mg/dL (ref 0.61–1.24)
GFR calc Af Amer: >60 mL/min (ref >60)
GFR calc non Af Amer: >60 mL/min (ref >60)
Glucose, Bld: 112 mg/dL — ABNORMAL HIGH (ref 70–99)
Potassium: 3.1 mmol/L — ABNORMAL LOW (ref 3.5–5.1)
Sodium: 145 mmol/L (ref 135–145)

## 2019-06-17 LAB — GLUCOSE, CAPILLARY
Glucose-Capillary: 105 mg/dL — ABNORMAL HIGH (ref 70–99)
Glucose-Capillary: 106 mg/dL — ABNORMAL HIGH (ref 70–99)
Glucose-Capillary: 121 mg/dL — ABNORMAL HIGH (ref 70–99)
Glucose-Capillary: 126 mg/dL — ABNORMAL HIGH (ref 70–99)
Glucose-Capillary: 127 mg/dL — ABNORMAL HIGH (ref 70–99)

## 2019-06-17 LAB — CULTURE, BLOOD (ROUTINE X 2)
Culture: NO GROWTH
Culture: NO GROWTH
Special Requests: ADEQUATE

## 2019-06-17 LAB — MAGNESIUM: Magnesium: 1.8 mg/dL (ref 1.7–2.4)

## 2019-06-17 LAB — PHOSPHORUS: Phosphorus: 3.6 mg/dL (ref 2.5–4.6)

## 2019-06-17 LAB — TRIGLYCERIDES: Triglycerides: 168 mg/dL — ABNORMAL HIGH (ref <150)

## 2019-06-17 MED ORDER — MAGNESIUM SULFATE 2 GM/50ML IV SOLN
2.0000 g | Freq: Once | INTRAVENOUS | Status: AC
  Administered 2019-06-17: 2 g via INTRAVENOUS
  Filled 2019-06-17: qty 50

## 2019-06-17 MED ORDER — POTASSIUM CHLORIDE 20 MEQ/15ML (10%) PO SOLN
40.0000 meq | ORAL | Status: AC
  Administered 2019-06-17 (×2): 40 meq
  Filled 2019-06-17 (×2): qty 30

## 2019-06-17 MED ORDER — FUROSEMIDE 10 MG/ML IJ SOLN
40.0000 mg | Freq: Once | INTRAMUSCULAR | Status: AC
  Administered 2019-06-17: 12:00:00 40 mg via INTRAVENOUS
  Filled 2019-06-17: qty 4
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
## 2019-06-17 NOTE — Progress Notes (Signed)
Pt was placed on 40%/10L trach collar. Pt is stable at this time. RT will monitor.

## 2019-06-17 NOTE — Progress Notes (Signed)
Pt was placed back on ventilator from trach collar. Pt had increased agitation, RR, and decreased saturations on trach collar. Pt is stable on the ventilator at this time. RT will monitor.

## 2019-06-18 DIAGNOSIS — G9341 Metabolic encephalopathy: Secondary | ICD-10-CM

## 2019-06-18 DIAGNOSIS — I471 Supraventricular tachycardia: Secondary | ICD-10-CM

## 2019-06-18 LAB — BASIC METABOLIC PANEL
Anion gap: 10 (ref 5–15)
BUN: 14 mg/dL (ref 6–20)
CO2: 25 mmol/L (ref 22–32)
Calcium: 8.1 mg/dL — ABNORMAL LOW (ref 8.9–10.3)
Chloride: 110 mmol/L (ref 98–111)
Creatinine, Ser: 0.7 mg/dL (ref 0.61–1.24)
GFR calc Af Amer: >60 mL/min (ref >60)
GFR calc non Af Amer: >60 mL/min (ref >60)
Glucose, Bld: 133 mg/dL — ABNORMAL HIGH (ref 70–99)
Potassium: 3.8 mmol/L (ref 3.5–5.1)
Sodium: 145 mmol/L (ref 135–145)

## 2019-06-18 LAB — GLUCOSE, CAPILLARY
Glucose-Capillary: 115 mg/dL — ABNORMAL HIGH (ref 70–99)
Glucose-Capillary: 121 mg/dL — ABNORMAL HIGH (ref 70–99)
Glucose-Capillary: 122 mg/dL — ABNORMAL HIGH (ref 70–99)
Glucose-Capillary: 124 mg/dL — ABNORMAL HIGH (ref 70–99)
Glucose-Capillary: 126 mg/dL — ABNORMAL HIGH (ref 70–99)
Glucose-Capillary: 130 mg/dL — ABNORMAL HIGH (ref 70–99)

## 2019-06-18 MED ORDER — GABAPENTIN 250 MG/5ML PO SOLN
300.0000 mg | Freq: Three times a day (TID) | ORAL | Status: DC
  Administered 2019-06-18 – 2019-06-26 (×25): 300 mg
  Filled 2019-06-18 (×28): qty 6

## 2019-06-18 MED ORDER — GABAPENTIN 600 MG PO TABS
300.0000 mg | ORAL_TABLET | Freq: Three times a day (TID) | ORAL | Status: DC
  Filled 2019-06-18: qty 0.5

## 2019-06-18 MED ORDER — QUETIAPINE FUMARATE 50 MG PO TABS
50.0000 mg | ORAL_TABLET | Freq: Two times a day (BID) | ORAL | Status: DC
  Administered 2019-06-18 – 2019-06-23 (×10): 50 mg
  Filled 2019-06-18 (×10): qty 1
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
## 2019-06-18 NOTE — Progress Notes (Signed)
Pt was placed back on ventilator due to increased agitation, high RR, and decreased saturations. Pt is stable at this time. RN is at bedside as well. RT will continue to monitor.

## 2019-06-18 NOTE — Progress Notes (Signed)
Pt was placed on trach collar 40%/10 L. Pt is stable at this time. RT will continue to monitor.

## 2019-06-19 ENCOUNTER — Inpatient Hospital Stay (HOSPITAL_COMMUNITY): Payer: Medicaid Other

## 2019-06-19 LAB — GLUCOSE, CAPILLARY
Glucose-Capillary: 106 mg/dL — ABNORMAL HIGH (ref 70–99)
Glucose-Capillary: 107 mg/dL — ABNORMAL HIGH (ref 70–99)
Glucose-Capillary: 117 mg/dL — ABNORMAL HIGH (ref 70–99)
Glucose-Capillary: 124 mg/dL — ABNORMAL HIGH (ref 70–99)
Glucose-Capillary: 127 mg/dL — ABNORMAL HIGH (ref 70–99)
Glucose-Capillary: 128 mg/dL — ABNORMAL HIGH (ref 70–99)
Glucose-Capillary: 131 mg/dL — ABNORMAL HIGH (ref 70–99)
Glucose-Capillary: 137 mg/dL — ABNORMAL HIGH (ref 70–99)

## 2019-06-19 LAB — CULTURE, BLOOD (ROUTINE X 2)
Culture: NO GROWTH
Culture: NO GROWTH
Special Requests: ADEQUATE

## 2019-06-19 LAB — CBC
HCT: 27.3 % — ABNORMAL LOW (ref 39.0–52.0)
Hemoglobin: 7.7 g/dL — ABNORMAL LOW (ref 13.0–17.0)
MCH: 23.6 pg — ABNORMAL LOW (ref 26.0–34.0)
MCHC: 28.2 g/dL — ABNORMAL LOW (ref 30.0–36.0)
MCV: 83.7 fL (ref 80.0–100.0)
Platelets: 323 10*3/uL (ref 150–400)
RBC: 3.26 MIL/uL — ABNORMAL LOW (ref 4.22–5.81)
RDW: 20.6 % — ABNORMAL HIGH (ref 11.5–15.5)
WBC: 8 10*3/uL (ref 4.0–10.5)
nRBC: 0.6 % — ABNORMAL HIGH (ref 0.0–0.2)

## 2019-06-19 LAB — CK: Total CK: 355 U/L (ref 49–397)

## 2019-06-19 LAB — LACTIC ACID, PLASMA: Lactic Acid, Venous: 1.1 mmol/L (ref 0.5–1.9)

## 2019-06-19 LAB — BASIC METABOLIC PANEL
Anion gap: 8 (ref 5–15)
BUN: 13 mg/dL (ref 6–20)
CO2: 28 mmol/L (ref 22–32)
Calcium: 8.2 mg/dL — ABNORMAL LOW (ref 8.9–10.3)
Chloride: 113 mmol/L — ABNORMAL HIGH (ref 98–111)
Creatinine, Ser: 0.63 mg/dL (ref 0.61–1.24)
GFR calc Af Amer: >60 mL/min (ref >60)
GFR calc non Af Amer: >60 mL/min (ref >60)
Glucose, Bld: 138 mg/dL — ABNORMAL HIGH (ref 70–99)
Potassium: 3.6 mmol/L (ref 3.5–5.1)
Sodium: 149 mmol/L — ABNORMAL HIGH (ref 135–145)

## 2019-06-19 LAB — C-REACTIVE PROTEIN: CRP: 14.8 mg/dL — ABNORMAL HIGH

## 2019-06-19 LAB — MAGNESIUM: Magnesium: 2 mg/dL (ref 1.7–2.4)

## 2019-06-19 LAB — PHOSPHORUS: Phosphorus: 4.5 mg/dL (ref 2.5–4.6)

## 2019-06-19 MED ORDER — FREE WATER
200.0000 mL | Status: DC
  Administered 2019-06-19 – 2019-06-20 (×14): 200 mL

## 2019-06-19 MED ORDER — FUROSEMIDE 10 MG/ML IJ SOLN
40.0000 mg | Freq: Two times a day (BID) | INTRAMUSCULAR | Status: AC
  Administered 2019-06-19 (×2): 40 mg via INTRAVENOUS
  Filled 2019-06-19 (×2): qty 4

## 2019-06-19 MED ORDER — DEXMEDETOMIDINE HCL IN NACL 400 MCG/100ML IV SOLN
0.4000 ug/kg/h | INTRAVENOUS | Status: DC
  Administered 2019-06-19 (×3): 1.1 ug/kg/h via INTRAVENOUS
  Administered 2019-06-19: 1.2 ug/kg/h via INTRAVENOUS
  Administered 2019-06-20: 1 ug/kg/h via INTRAVENOUS
  Administered 2019-06-20: 0.941 ug/kg/h via INTRAVENOUS
  Administered 2019-06-20: 0.6 ug/kg/h via INTRAVENOUS
  Administered 2019-06-20: 0.5 ug/kg/h via INTRAVENOUS
  Administered 2019-06-21: 1.2 ug/kg/h via INTRAVENOUS
  Administered 2019-06-21: 1 ug/kg/h via INTRAVENOUS
  Administered 2019-06-21 (×3): 0.9 ug/kg/h via INTRAVENOUS
  Administered 2019-06-22 (×7): 1.2 ug/kg/h via INTRAVENOUS
  Administered 2019-06-23 (×2): 1 ug/kg/h via INTRAVENOUS
  Administered 2019-06-23: 1.1 ug/kg/h via INTRAVENOUS
  Administered 2019-06-23: 1 ug/kg/h via INTRAVENOUS
  Administered 2019-06-23 – 2019-06-24 (×3): 1.2 ug/kg/h via INTRAVENOUS
  Administered 2019-06-24 – 2019-06-25 (×3): 1 ug/kg/h via INTRAVENOUS
  Administered 2019-06-25 (×4): 1.2 ug/kg/h via INTRAVENOUS
  Administered 2019-06-25: 0.8 ug/kg/h via INTRAVENOUS
  Administered 2019-06-25 – 2019-06-26 (×2): 1.2 ug/kg/h via INTRAVENOUS
  Administered 2019-06-26: 22:00:00 1.1 ug/kg/h via INTRAVENOUS
  Administered 2019-06-26: 18:00:00 0.8 ug/kg/h via INTRAVENOUS
  Administered 2019-06-26: 1.2 ug/kg/h via INTRAVENOUS
  Administered 2019-06-26: 11:00:00 0.8 ug/kg/h via INTRAVENOUS
  Administered 2019-06-27: 06:00:00 1 ug/kg/h via INTRAVENOUS
  Administered 2019-06-27: 02:00:00 1.1 ug/kg/h via INTRAVENOUS
  Administered 2019-06-27: 17:00:00 1.2 ug/kg/h via INTRAVENOUS
  Filled 2019-06-19 (×3): qty 100
  Filled 2019-06-19: qty 200
  Filled 2019-06-19 (×19): qty 100
  Filled 2019-06-19: qty 200
  Filled 2019-06-19 (×17): qty 100

## 2019-06-19 NOTE — Progress Notes (Signed)
Patient ID: William Holster., male   DOB: 12/28/1960, 59 y.o.   MRN: [id number]         [facility]  Date of Admission:  06/12/2019    Total days of antibiotics 8        Day 7 ampicillin sulbactam         ASSESSMENT: His neck and upper chest soft tissue infection is resolving slowly.  This was complicated by Haemophilus influenza bacteremia.  Repeat blood cultures were negative.  PLAN: 1. Continue ampicillin sulbactam  Principal Problem:   Neck infection Active Problems:   Alcohol abuse   Poor dentition   Septic shock (HCC)   Laryngeal edema   Multifocal pneumonia   Airway compromise   Chronic hepatitis C without hepatic coma (HCC)   Acute respiratory failure with hypoxemia (HCC)   Cardiac arrest, cause unspecified (HCC)   Haemophilis influenzae bacteremia    Malnutrition of moderate degree   Scheduled Meds:  chlorhexidine gluconate (MEDLINE KIT)  15 mL Mouth Rinse BID   Chlorhexidine Gluconate Cloth  6 each Topical Daily   clonazepam  0.5 mg Per Tube BID   cloNIDine  0.3 mg Per Tube Q6H   famotidine  20 mg Per Tube BID   folic acid  1 mg Intravenous Daily   free water  200 mL Per Tube Q2H   gabapentin  300 mg Per Tube TID   heparin injection (subcutaneous)  5,000 Units Subcutaneous Q8H   mouth rinse  15 mL Mouth Rinse 10 times per day   metoprolol tartrate  25 mg Per Tube BID   nicotine  21 mg Transdermal Daily   QUEtiapine  50 mg Per Tube BID   thiamine  100 mg Per Tube Daily   Continuous Infusions:  sodium chloride     ampicillin-sulbactam (UNASYN) IV 3 g (06/19/19 0746)   dexmedetomidine (PRECEDEX) IV infusion 1.1 mcg/kg/hr (06/19/19 0748)   feeding supplement (VITAL AF 1.2 CAL) 1,000 mL (06/18/19 2332)   fentaNYL infusion INTRAVENOUS 125 mcg/hr (06/18/19 2216)   PRN Meds:.sodium chloride, acetaminophen, fentaNYL, metoprolol tartrate   Review of Systems: Review of Systems  Unable to perform ROS: Intubated      No Active Allergies  OBJECTIVE: Vitals:   06/19/19 0800 06/19/19 0900 06/19/19 1000 06/19/19 1046  BP: 105/63 (!) 147/79 111/64   Pulse: 71 (!) 135 78   Resp: (!) 28 (!) 36 (!) 25   Temp:      TempSrc:      SpO2: 94% 100% 91% 92%  Weight:      Height:       Body mass index is 28.49 kg/m.  Physical Exam Constitutional:      Comments: He remains on the ventilator.  Chest:       Lab Results Lab Results  Component Value Date   WBC 8.0 06/19/2019   HGB 7.7 (L) 06/19/2019   HCT 27.3 (L) 06/19/2019   MCV 83.7 06/19/2019   PLT 323 06/19/2019    Lab Results  Component Value Date   CREATININE 0.63 06/19/2019   BUN 13 06/19/2019   NA 149 (H) 06/19/2019   K 3.6 06/19/2019   CL 113 (H) 06/19/2019   CO2 28 06/19/2019    Lab Results  Component Value Date   ALT <5 06/12/2019   AST 63 (H) 06/12/2019   ALKPHOS 48 06/12/2019   BILITOT 0.5 06/12/2019     Microbiology: Recent Results (from the past 240 hour(s))  Culture, blood (routine x 2)     Status: Abnormal   Collection Time: 06/12/19  9:09 AM   Specimen: BLOOD  Result Value Ref Range Status   Specimen Description BLOOD LEFT ANTECUBITAL  Final   Special Requests   Final    BOTTLES DRAWN AEROBIC AND ANAEROBIC Blood Culture adequate volume   Culture  Setup Time   Final    GRAM NEGATIVE RODS IN BOTH AEROBIC AND ANAEROBIC BOTTLES CRITICAL RESULT CALLED TO, READ BACK BY AND VERIFIED WITH: J. FRENS PHARMD, AT 1638 06/13/19 BY D. VANHOOK    Culture (A)  Final    HAEMOPHILUS INFLUENZAE BETA LACTAMASE NEGATIVE Performed at [facility], [place] [address], [place], [place] [address]    Report Status 06/15/2019 FINAL  Final  Culture, blood (routine x 2)     Status: Abnormal   Collection Time: 06/12/19  9:26 AM   Specimen: BLOOD LEFT FOREARM  Result Value Ref Range Status   Specimen Description BLOOD LEFT FOREARM  Final   Special Requests   Final    BOTTLES DRAWN AEROBIC AND ANAEROBIC Blood Culture adequate  volume   Culture  Setup Time   Final    GRAM NEGATIVE RODS IN BOTH AEROBIC AND ANAEROBIC BOTTLES CRITICAL RESULT CALLED TO, READ BACK BY AND VERIFIED WITH: J. FRENS PHARMD, AT 9357 06/13/19 BY D. VANHOOK    Culture HAEMOPHILUS INFLUENZAE BETA LACTAMASE NEGATIVE  (A)  Final   Report Status 06/15/2019 FINAL  Final  Blood Culture ID Panel (Reflexed)     Status: Abnormal   Collection Time: 06/12/19  9:26 AM  Result Value Ref Range Status   Enterococcus species NOT DETECTED NOT DETECTED Final   Listeria monocytogenes NOT DETECTED NOT DETECTED Final   Staphylococcus species NOT DETECTED NOT DETECTED Final   Staphylococcus aureus (BCID) NOT DETECTED NOT DETECTED Final   Streptococcus species NOT DETECTED NOT DETECTED Final   Streptococcus agalactiae NOT DETECTED NOT DETECTED Final   Streptococcus pneumoniae NOT DETECTED NOT DETECTED Final   Streptococcus pyogenes NOT DETECTED NOT DETECTED Final   Acinetobacter baumannii NOT DETECTED NOT DETECTED Final   Enterobacteriaceae species NOT DETECTED NOT DETECTED Final   Enterobacter cloacae complex NOT DETECTED NOT DETECTED Final   Escherichia coli NOT DETECTED NOT DETECTED Final   Klebsiella oxytoca NOT DETECTED NOT DETECTED Final   Klebsiella pneumoniae NOT DETECTED NOT DETECTED Final   Proteus species NOT DETECTED NOT DETECTED Final   Serratia marcescens NOT DETECTED NOT DETECTED Final   Haemophilus influenzae DETECTED (A) NOT DETECTED Final    Comment: CRITICAL RESULT CALLED TO, READ BACK BY AND VERIFIED WITH: J. FRENS PHARMD, AT 0177 06/13/19 BY D. VANHOOK    Neisseria meningitidis NOT DETECTED NOT DETECTED Final   Pseudomonas aeruginosa NOT DETECTED NOT DETECTED Final   Candida albicans NOT DETECTED NOT DETECTED Final   Candida glabrata NOT DETECTED NOT DETECTED Final   Candida krusei NOT DETECTED NOT DETECTED Final   Candida parapsilosis NOT DETECTED NOT DETECTED Final   Candida tropicalis NOT DETECTED NOT DETECTED Final    Comment:  Performed at [facility], [place] [address] [place], [place] [address]  Respiratory Panel by RT PCR (Flu A&B, Covid) - Nasopharyngeal Swab     Status: None   Collection Time: 06/12/19 10:05 AM   Specimen: Nasopharyngeal Swab  Result Value Ref Range Status   SARS Coronavirus 2 by RT PCR NEGATIVE NEGATIVE Final    Comment: (NOTE) SARS-CoV-2 target nucleic acids are NOT  DETECTED. The SARS-CoV-2 RNA is generally detectable in upper respiratoy specimens during the acute phase of infection. The lowest concentration of SARS-CoV-2 viral copies this assay can detect is 131 copies/mL. A negative result does not preclude SARS-Cov-2 infection and should not be used as the sole basis for treatment or other patient management decisions. A negative result may occur with  improper specimen collection/handling, submission of specimen other than nasopharyngeal swab, presence of viral mutation(s) within the areas targeted by this assay, and inadequate number of viral copies (<131 copies/mL). A negative result must be combined with clinical observations, patient history, and epidemiological information. The expected result is Negative. Fact Sheet for Patients:  PinkCheek.be Fact Sheet for Healthcare Providers:  GravelBags.it This test is not yet ap proved or cleared by the [place] FDA and  has been authorized for detection and/or diagnosis of SARS-CoV-2 by FDA under an Emergency Use Authorization (EUA). This EUA will remain  in effect (meaning this test can be used) for the duration of the COVID-19 declaration under Section 564(b)(1) of the Act, 21 U.S.C. section 360bbb-3(b)(1), unless the authorization is terminated or revoked sooner.    Influenza A by PCR NEGATIVE NEGATIVE Final   Influenza B by PCR NEGATIVE NEGATIVE Final    Comment: (NOTE) The Xpert Xpress SARS-CoV-2/FLU/RSV assay is intended as an aid in  the diagnosis of  influenza from Nasopharyngeal swab specimens and  should not be used as a sole basis for treatment. Nasal washings and  aspirates are unacceptable for Xpert Xpress SARS-CoV-2/FLU/RSV  testing. Fact Sheet for Patients: PinkCheek.be Fact Sheet for Healthcare Providers: GravelBags.it This test is not yet approved or cleared by the [place] FDA and  has been authorized for detection and/or diagnosis of SARS-CoV-2 by  FDA under an Emergency Use Authorization (EUA). This EUA will remain  in effect (meaning this test can be used) for the duration of the  Covid-19 declaration under Section 564(b)(1) of the Act, 21  U.S.C. section 360bbb-3(b)(1), unless the authorization is  terminated or revoked. Performed at [facility], [place] [address], [place], [place] [address]   Urine culture     Status: None   Collection Time: 06/12/19 12:56 PM   Specimen: In/Out Cath Urine  Result Value Ref Range Status   Specimen Description IN/OUT CATH URINE  Final   Special Requests NONE  Final   Culture   Final    NO GROWTH Performed at [facility], [place] [address] [place], [place] [address]    Report Status 06/13/2019 FINAL  Final  Culture, blood (x 2)     Status: None   Collection Time: 06/12/19  2:14 PM   Specimen: BLOOD LEFT HAND  Result Value Ref Range Status   Specimen Description BLOOD LEFT HAND  Final   Special Requests   Final    BOTTLES DRAWN AEROBIC ONLY Blood Culture results may not be optimal due to an inadequate volume of blood received in culture bottles   Culture   Final    NO GROWTH 5 DAYS Performed at [facility], [place] [address] [place],  [address]    Report Status 06/17/2019 FINAL  Final  Culture, blood (x 2)     Status: None   Collection Time: 06/12/19  2:48 PM   Specimen: Blood  Result Value Ref Range Status   Specimen Description BLOOD BLOOD RIGHT FOREARM  Final   Special Requests   Final     BOTTLES DRAWN AEROBIC ONLY  Blood Culture adequate volume   Culture   Final    NO GROWTH 5 DAYS Performed at [facility], [place] [address], [place], [place] [address]    Report Status 06/17/2019 FINAL  Final  MRSA PCR Screening     Status: None   Collection Time: 06/12/19  4:58 PM   Specimen: Nasal Mucosa; Nasopharyngeal  Result Value Ref Range Status   MRSA by PCR NEGATIVE NEGATIVE Final    Comment:        The GeneXpert MRSA Assay (FDA approved for NASAL specimens only), is one component of a comprehensive MRSA colonization surveillance program. It is not intended to diagnose MRSA infection nor to guide or monitor treatment for MRSA infections. Performed at [facility], [place] [address] [place], [place] [address]   Culture, blood (routine x 2)     Status: None   Collection Time: 06/14/19  3:22 PM   Specimen: BLOOD  Result Value Ref Range Status   Specimen Description BLOOD RIGHT ANTECUBITAL  Final   Special Requests   Final    BOTTLES DRAWN AEROBIC ONLY Blood Culture adequate volume   Culture   Final    NO GROWTH 5 DAYS Performed at [facility], [place] [address] [place], [place] [address]    Report Status 06/19/2019 FINAL  Final  Culture, blood (routine x 2)     Status: None   Collection Time: 06/14/19  3:27 PM   Specimen: BLOOD LEFT HAND  Result Value Ref Range Status   Specimen Description BLOOD LEFT HAND  Final   Special Requests   Final    BOTTLES DRAWN AEROBIC ONLY Blood Culture results may not be optimal due to an excessive volume of blood received in culture bottles   Culture   Final    NO GROWTH 5 DAYS Performed at [facility], [place] [address], [place], [place] [address]    Report Status 06/19/2019 FINAL  Final    Michel Bickers, MD [place] [facility] [phone] pager   [phone] cell 06/19/2019, 11:01 AM

## 2019-06-19 NOTE — Evaluation (Signed)
Physical Therapy Evaluation Patient Details Name: [name]. MRN: [id number] DOB: 1961-06-07 Today's Date: 06/19/2019   History of Present Illness  59 year old man with a history of alcohol use disorder, tobacco used disorder, HTN, and recent admission on 04/10/2019 for pancreatitis and hyponatremia who presents for evaluation of shortness of breath and neck swelling.   Clinical Impression  Pt admitted for above. Evaluation limited to bed level due to pt's current medical status and limited participation. Pt has good ROM in LE's with PROM, but pt was only able to actively flex his L knee. No further active LE movement was noted despite verbal and tactile cueing. Repositioned him in the bed with total assist +2 and helped him into chair position. Pt's sats 88-92% throughout session, and RN notified for potential need for suctioning. One instance of HR at 181 bpm when pt's head being lowered in the bed for scooting, but it quickly decreased to 130s, so unsure if this was an accurate reading. Recommend SNF to further address pt's mobility needs following acute discharge. Pt would benefit from continued skilled PT intervention in order to address deficits.     Follow Up Recommendations SNF;Supervision for mobility/OOB    Equipment Recommendations  None recommended by PT(TBD next venue)    Recommendations for Other Services       Precautions / Restrictions Restrictions Weight Bearing Restrictions: No      Mobility  Bed Mobility               General bed mobility comments: not performed due to limited participation; total assist +2 for scooting up in bed  Transfers                    Ambulation/Gait                Stairs            Wheelchair Mobility    Modified Rankin (Stroke Patients Only)       Balance                                             Pertinent Vitals/Pain Pain Assessment: Faces Faces Pain Scale: Hurts  whole lot Pain Location: stomach Pain Descriptors / Indicators: Aching Pain Intervention(s): Limited activity within patient's tolerance;Monitored during session    Home Living Family/patient expects to be discharged to:: Private residence Living Arrangements: Non-relatives/Friends Available Help at Discharge: Family;Available 24 hours/day Type of Home: House Home Access: Stairs to enter Entrance Stairs-Rails: Can reach both Entrance Stairs-Number of Steps: 3 Home Layout: One level Home Equipment: Walker - 2 wheels      Prior Function Level of Independence: Independent         Comments: works around the house per last admission     Hand Dominance   Dominant Hand: Right    Extremity/Trunk Assessment   Upper Extremity Assessment Upper Extremity Assessment: Defer to OT evaluation    Lower Extremity Assessment Lower Extremity Assessment: Generalized weakness;RLE deficits/detail;LLE deficits/detail RLE Deficits / Details: Very minimal active movement with AAROM of R knee; no movement of ankle LLE Deficits / Details: active movement of L knee/hip, none of ankle       Communication   Communication: No difficulties  Cognition Arousal/Alertness: Lethargic Behavior During Therapy: Flat affect Overall Cognitive Status: No family/caregiver present to determine baseline cognitive functioning; difficult to  assess due to trach collar                                        General Comments General comments (skin integrity, edema, etc.): 30% trach collar w/ PEEP 5 w/ pressure support; start vitals: 103 bpm, 95%, 129/71; end vitals: 121 bpm, 88%    Exercises     Assessment/Plan    PT Assessment Patient needs continued PT services  PT Problem List Decreased strength;Decreased range of motion;Decreased activity tolerance;Decreased balance;Decreased mobility;Decreased coordination;Decreased knowledge of use of DME;Decreased safety awareness;Decreased knowledge  of precautions;Cardiopulmonary status limiting activity;Pain       PT Treatment Interventions DME instruction;Gait training;Stair training;Functional mobility training;Therapeutic activities;Therapeutic exercise;Balance training;Neuromuscular re-education;Cognitive remediation;Patient/family education;Modalities;Wheelchair mobility training    PT Goals (Current goals can be found in the Care Plan section)  Acute Rehab PT Goals Patient Stated Goal: unable to state PT Goal Formulation: Patient unable to participate in goal setting Potential to Achieve Goals: Fair    Frequency Min 2X/week   Barriers to discharge        Co-evaluation PT/OT/SLP Co-Evaluation/Treatment: Yes Reason for Co-Treatment: Complexity of the patient's impairments (multi-system involvement);Necessary to address cognition/behavior during functional activity;For patient/therapist safety;To address functional/ADL transfers PT goals addressed during session: Mobility/safety with mobility;Strengthening/ROM OT goals addressed during session: ADL's and self-care;Strengthening/ROM       AM-PAC PT "6 Clicks" Mobility  Outcome Measure Help needed turning from your back to your side while in a flat bed without using bedrails?: Total Help needed moving from lying on your back to sitting on the side of a flat bed without using bedrails?: Total Help needed moving to and from a bed to a chair (including a wheelchair)?: Total Help needed standing up from a chair using your arms (e.g., wheelchair or bedside chair)?: Total Help needed to walk in hospital room?: Total Help needed climbing 3-5 steps with a railing? : Total 6 Click Score: 6    End of Session   Activity Tolerance: Patient limited by lethargy Patient left: in bed;with call bell/phone within reach;with restraints reapplied Nurse Communication: Mobility status PT Visit Diagnosis: Muscle weakness (generalized) (M62.81);Difficulty in walking, not elsewhere classified  (R26.2);Pain Pain - part of body: (abdomen)    Time: [id number] PT Time Calculation (min) (ACUTE ONLY): 22 min   Charges:   PT Evaluation $PT Eval Moderate Complexity: 1 Mod          Christel Mormon, SPT   Glory Graefe 06/19/2019, 9:15 AM
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
## 2019-06-19 NOTE — Evaluation (Signed)
Occupational Therapy Evaluation Patient Details Name: [name]. MRN: [id number] DOB: 15-Aug-1960 Today's Date: 06/19/2019    History of Present Illness 59 year old man with a history of alcohol use disorder, tobacco used disorder, HTN, and recent admission on 04/10/2019 for pancreatitis and hyponatremia who presents for evaluation of shortness of breath and neck swelling.    Clinical Impression   PTA patient report/per chart review independent with ADLs, mobility. Admitted for above and limited by problem list below, including generalized weakness, edema of BUEs, decreased activity tolerance, and impaired cognition.  Patient on vent via trach, PEEP 5 30% FiO2 with SpO2 ranging from 88-92% throughout session; VSS other than HR increased to 180s briefly during bed mobility to reposition.  RN aware of need to be suctioned.  Evaluation limited to bed level, and he requires +2 total assist for all self care and repositioning in bed today.  Provided PROM to BUEs, repositioned UEs on pillows to support edema reduction.  Based on performance today, patient will require 24/7 assist at discharge and recommend continued OT services at SNF level to optimize independence and safety with ADLs.  Will follow acutely.     Follow Up Recommendations  SNF;Supervision/Assistance - 24 hour    Equipment Recommendations  Other (comment)(TBD at next venue of care)    Recommendations for Other Services       Precautions / Restrictions Precautions Precautions: Fall;Other (comment) Precaution Comments: vent via trach  Restrictions Weight Bearing Restrictions: No      Mobility Bed Mobility Overal bed mobility: Needs Assistance             General bed mobility comments: repositioned by scooting up in bed with total assist +2   Transfers                 General transfer comment: deferred    Balance                                           ADL either performed or  assessed with clinical judgement   ADL Overall ADL's : Needs assistance/impaired                                     Functional mobility during ADLs: Total assistance;+2 for physical assistance General ADL Comments: total assist for ADLs at this time      Vision   Additional Comments: further assessment required      Perception     Praxis      Pertinent Vitals/Pain Pain Assessment: Faces Faces Pain Scale: Hurts whole lot Pain Location: stomach Pain Descriptors / Indicators: Aching Pain Intervention(s): Limited activity within patient's tolerance;Monitored during session;Repositioned     Hand Dominance Right   Extremity/Trunk Assessment Upper Extremity Assessment Upper Extremity Assessment: RUE deficits/detail;LUE deficits/detail RUE Deficits / Details: grossly 3-/5 MMT, able to initate movement but requires assist to complete ROM; PROM WFL limited by some edema  RUE Coordination: decreased fine motor;decreased gross motor LUE Deficits / Details: grossly 3-/5 MMT, able to initate movement but requires assist to complete ROM; PROM WFL limited by some edema  LUE Coordination: decreased fine motor;decreased gross motor   Lower Extremity Assessment Lower Extremity Assessment: Defer to PT evaluation RLE Deficits / Details: Very minimal active movement with AAROM of R knee; no movement of  ankle LLE Deficits / Details: active movement of L knee/hip, none of ankle       Communication Communication Communication: No difficulties   Cognition Arousal/Alertness: Lethargic Behavior During Therapy: Flat affect Overall Cognitive Status: Difficult to assess                                 General Comments: pt able to follow minimal commands to initate tasks, lethargic and intubated    General Comments  Vent settings: 30% trach collar w/ PEEP 5 w/ pressure support; start vitals: 103 bpm, 95%, 129/71; end vitals: 121 bpm, 88%    Exercises      Shoulder Instructions      Home Living Family/patient expects to be discharged to:: Private residence Living Arrangements: Non-relatives/Friends Available Help at Discharge: Family;Available 24 hours/day Type of Home: House Home Access: Stairs to enter [employer] of Steps: 3 Entrance Stairs-Rails: Can reach both Home Layout: One level     Bathroom Shower/Tub: Occupational psychologist: Standard     Home Equipment: Environmental consultant - 2 wheels   Additional Comments: limited history per pt      Prior Functioning/Environment Level of Independence: Independent        Comments: works around the house per last admission, reports able to bathe and dress himself        OT Problem List: Decreased strength;Decreased activity tolerance;Impaired balance (sitting and/or standing);Decreased coordination;Decreased range of motion;Decreased cognition;Decreased safety awareness;Decreased knowledge of use of DME or AE;Decreased knowledge of precautions;Cardiopulmonary status limiting activity;Pain;Impaired UE functional use;Increased edema      OT Treatment/Interventions: Self-care/ADL training;Therapeutic exercise;DME and/or AE instruction;Therapeutic activities;Cognitive remediation/compensation;Patient/family education;Balance training    OT Goals(Current goals can be found in the care plan section) Acute Rehab OT Goals Patient Stated Goal: unable to state OT Goal Formulation: Patient unable to participate in goal setting Time For Goal Achievement: 07/03/19 Potential to Achieve Goals: Fair  OT Frequency: Min 2X/week   Barriers to D/C:            Co-evaluation PT/OT/SLP Co-Evaluation/Treatment: Yes Reason for Co-Treatment: Complexity of the patient's impairments (multi-system involvement);Necessary to address cognition/behavior during functional activity;For patient/therapist safety PT goals addressed during session: Mobility/safety with mobility;Strengthening/ROM OT  goals addressed during session: ADL's and self-care;Strengthening/ROM      AM-PAC OT "6 Clicks" Daily Activity     Outcome Measure Help from another person eating meals?: Total Help from another person taking care of personal grooming?: Total Help from another person toileting, which includes using toliet, bedpan, or urinal?: Total Help from another person bathing (including washing, rinsing, drying)?: Total Help from another person to put on and taking off regular upper body clothing?: Total Help from another person to put on and taking off regular lower body clothing?: Total 6 Click Score: 6   End of Session Equipment Utilized During Treatment: Other (comment)(vent ) Nurse Communication: Mobility status  Activity Tolerance: Patient limited by lethargy Patient left: in bed;with call bell/phone within reach;with bed alarm set;with restraints reapplied;with SCD's reapplied  OT Visit Diagnosis: Other abnormalities of gait and mobility (R26.89);Muscle weakness (generalized) (M62.81);Pain;Other symptoms and signs involving cognitive function Pain - part of body: (stomach)                Time: [id number] OT Time Calculation (min): 16 min Charges:  OT General Charges $OT Visit: 1 Visit OT Evaluation $OT Eval Moderate Complexity: 1 Mod  Sundus Pete B, OT Acute  Rehabilitation Services Pager [phone] Office [phone]    [name] 06/19/2019, 11:42 AM

## 2019-06-20 LAB — BASIC METABOLIC PANEL
Anion gap: 10 (ref 5–15)
BUN: 13 mg/dL (ref 6–20)
CO2: 29 mmol/L (ref 22–32)
Calcium: 8.3 mg/dL — ABNORMAL LOW (ref 8.9–10.3)
Chloride: 108 mmol/L (ref 98–111)
Creatinine, Ser: 0.74 mg/dL (ref 0.61–1.24)
GFR calc Af Amer: >60 mL/min (ref >60)
GFR calc non Af Amer: >60 mL/min (ref >60)
Glucose, Bld: 132 mg/dL — ABNORMAL HIGH (ref 70–99)
Potassium: 3.2 mmol/L — ABNORMAL LOW (ref 3.5–5.1)
Sodium: 147 mmol/L — ABNORMAL HIGH (ref 135–145)

## 2019-06-20 LAB — CBC
HCT: 25.1 % — ABNORMAL LOW (ref 39.0–52.0)
Hemoglobin: 7.1 g/dL — ABNORMAL LOW (ref 13.0–17.0)
MCH: 23.3 pg — ABNORMAL LOW (ref 26.0–34.0)
MCHC: 28.3 g/dL — ABNORMAL LOW (ref 30.0–36.0)
MCV: 82.3 fL (ref 80.0–100.0)
Platelets: 358 10*3/uL (ref 150–400)
RBC: 3.05 MIL/uL — ABNORMAL LOW (ref 4.22–5.81)
RDW: 20.4 % — ABNORMAL HIGH (ref 11.5–15.5)
WBC: 9.9 10*3/uL (ref 4.0–10.5)
nRBC: 0.3 % — ABNORMAL HIGH (ref 0.0–0.2)

## 2019-06-20 LAB — GLUCOSE, CAPILLARY
Glucose-Capillary: 121 mg/dL — ABNORMAL HIGH (ref 70–99)
Glucose-Capillary: 104 mg/dL — ABNORMAL HIGH (ref 70–99)
Glucose-Capillary: 109 mg/dL — ABNORMAL HIGH (ref 70–99)
Glucose-Capillary: 85 mg/dL (ref 70–99)
Glucose-Capillary: 106 mg/dL — ABNORMAL HIGH (ref 70–99)

## 2019-06-20 LAB — PHOSPHORUS: Phosphorus: 5.6 mg/dL — ABNORMAL HIGH (ref 2.5–4.6)

## 2019-06-20 LAB — MAGNESIUM: Magnesium: 2 mg/dL (ref 1.7–2.4)

## 2019-06-20 MED ORDER — FREE WATER
250.0000 mL | Status: DC
  Administered 2019-06-20 – 2019-06-22 (×15): 250 mL

## 2019-06-20 MED ORDER — VITAL AF 1.2 CAL PO LIQD
1000.0000 mL | ORAL | Status: DC
  Administered 2019-06-20 – 2019-07-17 (×30): 1000 mL
  Filled 2019-06-20 (×20): qty 1000

## 2019-06-20 MED ORDER — POTASSIUM CHLORIDE 20 MEQ PO PACK
40.0000 meq | PACK | Freq: Once | ORAL | Status: AC
  Administered 2019-06-20: 40 meq
  Filled 2019-06-20: qty 2
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
## 2019-06-20 NOTE — Progress Notes (Signed)
ENT Consult Progress Note  Subjective:     Erythema/cellulitis improving.  Neck swelling almost completely resolved.  Decreased erythema and swelling.   Patient still sedated, on vent.  Weaning sedation.  Objective: Vitals:   06/20/19 0700 06/20/19 0754  BP: (!) 92/53   Pulse: 62   Resp: 18   Temp: (!) 97.2 F (36.2 C)   SpO2: 96% 96%    Physical Exam: CONSTITUTIONAL: well developed, nourished, no distress   Neck & CHEST WALL:  Erythema at base of neck is nearly resolved.  No significant edema in the neck.  Neck is soft.. Left chest wall with improved erythema, as noted by its previous ink markings, however persistent left chest wall erythema and mild pitting quality.  There is nothing fluctuant.   EYES: conjunctiva normal, EOM normal and PERRL  Trach in place, 6 DCT Vent Mode: PSV;CPAP FiO2 (%):  [30 %] 30 % Set Rate:  [18 bmp] 18 bmp Vt Set:  [540 mL] 540 mL PEEP:  [5 cmH20] 5 cmH20 Pressure Support:  [5 cmH20] 5 cmH20 Plateau Pressure:  [21 cmH20-29 cmH20] 25 cmH20    Data Review/Consults/Discussions: CT neck personally reviewed.  No drainable fluid collection.  No obvious airway swelling.  No odontogenic infection.  Procedure: Transnasal Flexible Laryngoscopy  Preoperative Diagnosis: Airway compromise, swelling Postoperative Diagnosis: Same Procedure: Transnasal fiberoptic laryngoscopy.  Estimated Blood Loss: 0 mL.  Complications: None.  Findings: The nasal cavity and nasopharynx are unremarkable. There are no suspicious findings in the nasopharynx or Fossa of [place]mller. The tongue base, pharyngeal walls, piriform sinuses, vallecula, epiglottis  are normal in appearance.  The post cricoid region is edematous.  There are copious secretions.  Unable to visualize false cords or true cords.  Unable to assess vocal cord motion due to secretions and global mild edema.  No obvious mass.     Description of Procedure: With the patient in the sitting position, topical   Afrin-lidocaine mixture in an atomizer was applied to the nose. The scope was passed through the nose. Examination was carried out of the nose, nasopharynx, oropharynx, hypopharynx, and larynx with findings as noted above. Scope was removed.  The patient tolerated the procedure well.    Assessment: [name]. 59 y.o. male who presents with neck cellulitis, airway compromise.  Cellulitis seems to be clinically improving   Plan: Antibiotics per ID Trach management per CCM/ICU We will plan on repeating TFL at a later date, possibly in clinic.  Thank you for involving [facility] in the care of this patient. Should you need further assistance, please call our office at [phone].    Gavin Pound, MD

## 2019-06-20 NOTE — Progress Notes (Signed)
Nutrition Follow-up  DOCUMENTATION CODES:   Non-severe (moderate) malnutrition in context of chronic illness  INTERVENTION:   Continue TF via Cortrak tube:   Vital AF 1.2 increase to 65 ml/h (1560 ml per day)  Provides 1872 kcal, 117 gm protein, 1265 ml free water daily   NUTRITION DIAGNOSIS:   Moderate Malnutrition related to chronic illness(COPD, alcoholism) as evidenced by mild fat depletion, moderate fat depletion, mild muscle depletion.  Ongoing   GOAL:   Patient will meet greater than or equal to 90% of their needs  Met with TF  MONITOR:   Vent status, Labs, TF tolerance, Skin, I & O's  ASSESSMENT:   59 yo male admitted with SOB, neck swelling, sore throat. Found to have H. flu in blood. S/P PEA arrest requiring emergent tracheostomy 1/4. PMH includes alcohol abuse, dysphagia, chronic hepatitis C, heavy smoker, COPD, poor dentition.   Patient remains on ventilator support via trach. MV: 12.6 L/min Temp (24hrs), Avg:98.4 F (36.9 C), Min:97.2 F (36.2 C), Max:99.4 F (37.4 C)   Receiving Vital AF 1.2 at 60 ml/h, tolerating well via Cortrak tube.  Labs reviewed. Sodium 147 (H), potassium 3.2 (L), phosphorus 5.6 (H) CBG's: 121-104-109  Medications reviewed and include folic acid, thiamine.   Cellulitis is improving. Neck swelling has decreased.   Admission weight 69.6 kg Current weight 84.4 kg I/O + 15 L since admission   Diet Order:   Diet Order            Diet NPO time specified  Diet effective now              EDUCATION NEEDS:   Not appropriate for education at this time  Skin:  Skin Assessment: Reviewed RN Assessment(cellulitis to neck & chest)  Last BM:  1/12  Height:   Ht Readings from Last 1 Encounters:  06/12/19 5' 8"  (1.727 m)    Weight:   Wt Readings from Last 1 Encounters:  06/20/19 84.4 kg    Ideal Body Weight:  70 kg  BMI:  Body mass index is 28.29 kg/m.  Estimated Nutritional Needs:   Kcal:   3154  Protein:  100-115 gm  Fluid:  >/= 1.8 L    Molli Barrows, RD, LDN, [place] Pager [phone] After Hours Pager [phone]

## 2019-06-20 NOTE — Progress Notes (Signed)
Patient ID: [name]., male   DOB: 12-Jun-1960, 59 y.o.   MRN: [id number]         [facility]  Date of Admission:  06/12/2019    Total days of antibiotics 10                 ASSESSMENT: His neck and upper chest soft tissue infection is resolving.  This was complicated by Haemophilus influenza bacteremia.  Repeat blood cultures were negative.  Given the persistent induration over his chest and left neck and slow resolution I would favor a 3-week course of total antibiotics.  He can complete treatment with oral amoxicillin clavulanate once he can safely take oral medications.  PLAN: 1. Continue ampicillin sulbactam/amoxicillin clavulanate for 3 weeks through 07/01/2019 2. I will sign off now but please call if we can be of further assistance while he is here  Principal Problem:   Neck infection Active Problems:   Alcohol abuse   Poor dentition   Septic shock (HCC)   Laryngeal edema   Multifocal pneumonia   Airway compromise   Chronic hepatitis C without hepatic coma (HCC)   Acute respiratory failure with hypoxemia (HCC)   Cardiac arrest, cause unspecified (HCC)   Haemophilis influenzae bacteremia    Malnutrition of moderate degree   Scheduled Meds: . chlorhexidine gluconate (MEDLINE KIT)  15 mL Mouth Rinse BID  . Chlorhexidine Gluconate Cloth  6 each Topical Daily  . clonazepam  0.5 mg Per Tube BID  . cloNIDine  0.3 mg Per Tube Q6H  . famotidine  20 mg Per Tube BID  . folic acid  1 mg Intravenous Daily  . free water  200 mL Per Tube Q2H  . gabapentin  300 mg Per Tube TID  . heparin injection (subcutaneous)  5,000 Units Subcutaneous Q8H  . mouth rinse  15 mL Mouth Rinse 10 times per day  . nicotine  21 mg Transdermal Daily  . QUEtiapine  50 mg Per Tube BID  . thiamine  100 mg Per Tube Daily   Continuous Infusions: . sodium chloride    . ampicillin-sulbactam (UNASYN) IV Stopped (06/20/19 0750)  . dexmedetomidine (PRECEDEX) IV infusion  0.6 mcg/kg/hr (06/20/19 1000)  . feeding supplement (VITAL AF 1.2 CAL) 60 mL/hr at 06/20/19 0000  . fentaNYL infusion INTRAVENOUS 50 mcg/hr (06/20/19 1000)   PRN Meds:.sodium chloride, acetaminophen, fentaNYL, metoprolol tartrate   Review of Systems: Review of Systems  Unable to perform ROS: Intubated    No Active Allergies  OBJECTIVE: Vitals:   06/20/19 0900 06/20/19 1000 06/20/19 1054 06/20/19 1116  BP: (!) 94/50 (!) 141/100 (!) 141/100   Pulse: 65 64    Resp: 18 18    Temp:    98.1 F (36.7 C)  TempSrc:    Axillary  SpO2: 96% 97%  97%  Weight:      Height:       Body mass index is 28.29 kg/m.  Physical Exam Constitutional:      Comments: He remains on the ventilator.  He is more alert and agitated after his sedation was lowered this morning.  Chest:       Lab Results Lab Results  Component Value Date   WBC 9.9 06/20/2019   HGB 7.1 (L) 06/20/2019   HCT 25.1 (L) 06/20/2019   MCV 82.3 06/20/2019   PLT 358 06/20/2019    Lab Results  Component Value Date   CREATININE 0.74 06/20/2019   BUN 13 06/20/2019  NA 147 (H) 06/20/2019   K 3.2 (L) 06/20/2019   CL 108 06/20/2019   CO2 29 06/20/2019    Lab Results  Component Value Date   ALT <5 06/12/2019   AST 63 (H) 06/12/2019   ALKPHOS 48 06/12/2019   BILITOT 0.5 06/12/2019     Microbiology: Recent Results (from the past 240 hour(s))  Culture, blood (routine x 2)     Status: Abnormal   Collection Time: 06/12/19  9:09 AM   Specimen: BLOOD  Result Value Ref Range Status   Specimen Description BLOOD LEFT ANTECUBITAL  Final   Special Requests   Final    BOTTLES DRAWN AEROBIC AND ANAEROBIC Blood Culture adequate volume   Culture  Setup Time   Final    GRAM NEGATIVE RODS IN BOTH AEROBIC AND ANAEROBIC BOTTLES CRITICAL RESULT CALLED TO, READ BACK BY AND VERIFIED WITH: J. FRENS PHARMD, AT [id number] 06/13/19 BY D. VANHOOK    Culture (A)  Final    HAEMOPHILUS INFLUENZAE BETA LACTAMASE NEGATIVE Performed at [facility], [place] [address] [place], [place] [address]    Report Status 06/15/2019 FINAL  Final  Culture, blood (routine x 2)     Status: Abnormal   Collection Time: 06/12/19  9:26 AM   Specimen: BLOOD LEFT FOREARM  Result Value Ref Range Status   Specimen Description BLOOD LEFT FOREARM  Final   Special Requests   Final    BOTTLES DRAWN AEROBIC AND ANAEROBIC Blood Culture adequate volume   Culture  Setup Time   Final    GRAM NEGATIVE RODS IN BOTH AEROBIC AND ANAEROBIC BOTTLES CRITICAL RESULT CALLED TO, READ BACK BY AND VERIFIED WITH: J. FRENS PHARMD, AT [id number] 06/13/19 BY D. VANHOOK    Culture HAEMOPHILUS INFLUENZAE BETA LACTAMASE NEGATIVE  (A)  Final   Report Status 06/15/2019 FINAL  Final  Blood Culture ID Panel (Reflexed)     Status: Abnormal   Collection Time: 06/12/19  9:26 AM  Result Value Ref Range Status   Enterococcus species NOT DETECTED NOT DETECTED Final   Listeria monocytogenes NOT DETECTED NOT DETECTED Final   Staphylococcus species NOT DETECTED NOT DETECTED Final   Staphylococcus aureus (BCID) NOT DETECTED NOT DETECTED Final   Streptococcus species NOT DETECTED NOT DETECTED Final   Streptococcus agalactiae NOT DETECTED NOT DETECTED Final   Streptococcus pneumoniae NOT DETECTED NOT DETECTED Final   Streptococcus pyogenes NOT DETECTED NOT DETECTED Final   Acinetobacter baumannii NOT DETECTED NOT DETECTED Final   Enterobacteriaceae species NOT DETECTED NOT DETECTED Final   Enterobacter cloacae complex NOT DETECTED NOT DETECTED Final   Escherichia coli NOT DETECTED NOT DETECTED Final   Klebsiella oxytoca NOT DETECTED NOT DETECTED Final   Klebsiella pneumoniae NOT DETECTED NOT DETECTED Final   Proteus species NOT DETECTED NOT DETECTED Final   Serratia marcescens NOT DETECTED NOT DETECTED Final   Haemophilus influenzae DETECTED (A) NOT DETECTED Final    Comment: CRITICAL RESULT CALLED TO, READ BACK BY AND VERIFIED WITH: J. FRENS PHARMD, AT 3244 06/13/19 BY D. VANHOOK     Neisseria meningitidis NOT DETECTED NOT DETECTED Final   Pseudomonas aeruginosa NOT DETECTED NOT DETECTED Final   Candida albicans NOT DETECTED NOT DETECTED Final   Candida glabrata NOT DETECTED NOT DETECTED Final   Candida krusei NOT DETECTED NOT DETECTED Final   Candida parapsilosis NOT DETECTED NOT DETECTED Final   Candida tropicalis NOT DETECTED NOT DETECTED Final    Comment: Performed at [facility], [place]  [address] [place], [place] [address]  Respiratory Panel by RT PCR (Flu A&B, Covid) - Nasopharyngeal Swab     Status: None   Collection Time: 06/12/19 10:05 AM   Specimen: Nasopharyngeal Swab  Result Value Ref Range Status   SARS Coronavirus 2 by RT PCR NEGATIVE NEGATIVE Final    Comment: (NOTE) SARS-CoV-2 target nucleic acids are NOT DETECTED. The SARS-CoV-2 RNA is generally detectable in upper respiratoy specimens during the acute phase of infection. The lowest concentration of SARS-CoV-2 viral copies this assay can detect is 131 copies/mL. A negative result does not preclude SARS-Cov-2 infection and should not be used as the sole basis for treatment or other patient management decisions. A negative result may occur with  improper specimen collection/handling, submission of specimen other than nasopharyngeal swab, presence of viral mutation(s) within the areas targeted by this assay, and inadequate number of viral copies (<131 copies/mL). A negative result must be combined with clinical observations, patient history, and epidemiological information. The expected result is Negative. Fact Sheet for Patients:  PinkCheek.be Fact Sheet for Healthcare Providers:  GravelBags.it This test is not yet ap proved or cleared by the [place] FDA and  has been authorized for detection and/or diagnosis of SARS-CoV-2 by FDA under an Emergency Use Authorization (EUA). This EUA will remain  in effect (meaning this test can be  used) for the duration of the COVID-19 declaration under Section 564(b)(1) of the Act, 21 U.S.C. section 360bbb-3(b)(1), unless the authorization is terminated or revoked sooner.    Influenza A by PCR NEGATIVE NEGATIVE Final   Influenza B by PCR NEGATIVE NEGATIVE Final    Comment: (NOTE) The Xpert Xpress SARS-CoV-2/FLU/RSV assay is intended as an aid in  the diagnosis of influenza from Nasopharyngeal swab specimens and  should not be used as a sole basis for treatment. Nasal washings and  aspirates are unacceptable for Xpert Xpress SARS-CoV-2/FLU/RSV  testing. Fact Sheet for Patients: PinkCheek.be Fact Sheet for Healthcare Providers: GravelBags.it This test is not yet approved or cleared by the [place] FDA and  has been authorized for detection and/or diagnosis of SARS-CoV-2 by  FDA under an Emergency Use Authorization (EUA). This EUA will remain  in effect (meaning this test can be used) for the duration of the  Covid-19 declaration under Section 564(b)(1) of the Act, 21  U.S.C. section 360bbb-3(b)(1), unless the authorization is  terminated or revoked. Performed at [facility], [place] [address], [place], [place] [address]   Urine culture     Status: None   Collection Time: 06/12/19 12:56 PM   Specimen: In/Out Cath Urine  Result Value Ref Range Status   Specimen Description IN/OUT CATH URINE  Final   Special Requests NONE  Final   Culture   Final    NO GROWTH Performed at [facility], [place] [address], [place], [place] [address]    Report Status 06/13/2019 FINAL  Final  Culture, blood (x 2)     Status: None   Collection Time: 06/12/19  2:14 PM   Specimen: BLOOD LEFT HAND  Result Value Ref Range Status   Specimen Description BLOOD LEFT HAND  Final   Special Requests   Final    BOTTLES DRAWN AEROBIC ONLY Blood Culture results may not be optimal due to an inadequate volume of blood received in culture  bottles   Culture   Final    NO GROWTH 5 DAYS Performed at [facility], [place] [address] [place], [place]  [address]    Report Status 06/17/2019 FINAL  Final  Culture, blood (x 2)     Status: None   Collection Time: 06/12/19  2:48 PM   Specimen: Blood  Result Value Ref Range Status   Specimen Description BLOOD BLOOD RIGHT FOREARM  Final   Special Requests   Final    BOTTLES DRAWN AEROBIC ONLY Blood Culture adequate volume   Culture   Final    NO GROWTH 5 DAYS Performed at [facility], [place] [address], [place], [place] [address]    Report Status 06/17/2019 FINAL  Final  MRSA PCR Screening     Status: None   Collection Time: 06/12/19  4:58 PM   Specimen: Nasal Mucosa; Nasopharyngeal  Result Value Ref Range Status   MRSA by PCR NEGATIVE NEGATIVE Final    Comment:        The GeneXpert MRSA Assay (FDA approved for NASAL specimens only), is one component of a comprehensive MRSA colonization surveillance program. It is not intended to diagnose MRSA infection nor to guide or monitor treatment for MRSA infections. Performed at [facility], [place] [address], [place], [place] [address]   Culture, blood (routine x 2)     Status: None   Collection Time: 06/14/19  3:22 PM   Specimen: BLOOD  Result Value Ref Range Status   Specimen Description BLOOD RIGHT ANTECUBITAL  Final   Special Requests   Final    BOTTLES DRAWN AEROBIC ONLY Blood Culture adequate volume   Culture   Final    NO GROWTH 5 DAYS Performed at [facility], [place] [address] [place], [place] [address]    Report Status 06/19/2019 FINAL  Final  Culture, blood (routine x 2)     Status: None   Collection Time: 06/14/19  3:27 PM   Specimen: BLOOD LEFT HAND  Result Value Ref Range Status   Specimen Description BLOOD LEFT HAND  Final   Special Requests   Final    BOTTLES DRAWN AEROBIC ONLY Blood Culture results may not be optimal due to an excessive volume of blood received in culture bottles    Culture   Final    NO GROWTH 5 DAYS Performed at [facility], [place] [address] [place], [place] [address]    Report Status 06/19/2019 FINAL  Final    Michel Bickers, MD [facility] [phone] pager   [phone] cell 06/20/2019, 11:51 AM

## 2019-06-20 NOTE — Progress Notes (Signed)
ATTESTATION & SIGNATURE   STAFF NOTE: I, Dr Ann Lions have personally reviewed patient's available data, including medical history, events of note, physical examination and test results as part of my evaluation. I have discussed with resident/NP and other care providers such as pharmacist, RN and RRT.  In addition,  I personally evaluated patient and elicited key findings of   S: Date of admit 06/12/2019 with LOS 8 for today 06/20/2019 : [name]. is   - on fent 150 and precedex. Also on seroquel, clonidine, klonopin Agitated during wean with sedation on and tachypenic and tachycardic. On lopressor as well and BP soft  Normal CK, Normal lactate CT neck and chest without nec fascitisi  ID and ENT following   O:  Blood pressure (!) 92/53, pulse 62, temperature (!) 97.2 F (36.2 C), temperature source Axillary, resp. rate 18, height 5\' 8"  (1.727 m), weight 84.4 kg, SpO2 96 %.   RASS -4 on fent gtt, precedex gtt, seroquel, clonidine, klonopin.  HR 60 and MAP 61 on lopressor and precedex Sync iwht vent Left neck and left Upper chest induration, warmth and tenderness improving   LABS    PULMONARY No results for input(s): PHART, PCO2ART, PO2ART, HCO3, TCO2, O2SAT in the last 168 hours.  Invalid input(s): PCO2, PO2  CBC Recent Labs  Lab 06/17/19 0500 06/19/19 0710 06/20/19 0701  HGB 8.9* 7.7* 7.1*  HCT 31.6* 27.3* 25.1*  WBC 4.3 8.0 9.9  PLT 210 323 358    COAGULATION No results for input(s): INR in the last 168 hours.  CARDIAC  No results for input(s): TROPONINI in the last 168 hours. No results for input(s): PROBNP in the last 168 hours.   CHEMISTRY Recent Labs  Lab 06/15/19 0608 06/16/19 0329 06/17/19 0500 06/18/19 0701 06/19/19 0710 06/20/19 0701  NA 145 145 145 145 149* 147*  K 3.8 3.9 3.1* 3.8 3.6 3.2*  CL 111 112* 110 110 113* 108  CO2 25 25 28 25 28 29   GLUCOSE 103* 120* 112* 133* 138* 132*  BUN 18 15 12 14 13 13   CREATININE 0.66 0.78 0.76  0.70 0.63 0.74  CALCIUM 8.3* 8.0* 7.8* 8.1* 8.2* 8.3*  MG 1.9 1.9 1.8  --  2.0 2.0  PHOS 3.8 3.2 3.6  --  4.5 5.6*   Estimated Creatinine Clearance: 107.8 mL/min (by C-G formula based on SCr of 0.74 mg/dL).   LIVER No results for input(s): AST, ALT, ALKPHOS, BILITOT, PROT, ALBUMIN, INR in the last 168 hours.   INFECTIOUS Recent Labs  Lab 06/19/19 1513  LATICACIDVEN 1.1     ENDOCRINE CBG (last 3)  Recent Labs    06/19/19 2321 06/20/19 0320 06/20/19 0736  GLUCAP 128* 121* 104*         IMAGING x24h  - image(s) personally visualized  -   highlighted in bold CT SOFT TISSUE NECK WO CONTRAST  Result Date: 06/19/2019 CLINICAL DATA:  Neck cellulitis, induration EXAM: CT NECK WITHOUT CONTRAST TECHNIQUE: Multidetector CT imaging of the neck was performed following the standard protocol without intravenous contrast. COMPARISON:  06/12/2019 FINDINGS: Pharynx and larynx: Interval placement of tracheostomy device. Suboptimal evaluation due to apposition of mucosal surfaces. No new abnormality. Salivary glands: Unremarkable. Thyroid: Unremarkable but partially obscured by artifact. Lymph nodes: No new adenopathy identified. Vascular: Calcified plaque at the bifurcations. Limited evaluation on this noncontrast study Limited intracranial: No acute abnormality Visualized orbits: Unremarkable. Mastoids and visualized paranasal sinuses: Nonspecific paranasal sinus mucosal thickening patchy opacification. Mastoid air  cells are clear. Skeleton: Degenerative changes of the cervical spine. Upper chest: Partially imaged small left pleural effusion. Other: Persistent but slightly improved left greater than right subcutaneous soft tissue swelling with fat infiltration and fascial thickening. There is no evidence of organized collection on this noncontrast study. IMPRESSION: Persistent but slightly improved left greater than right lower facial and infrahyoid neck subcutaneous edema and presumed inflammatory  changes extending into the anterior chest wall. No evidence of developing collection on this noncontrast study. Electronically Signed   By: Macy Mis M.D.   On: 06/19/2019 13:03   CT CHEST WO CONTRAST  Result Date: 06/19/2019 CLINICAL DATA:  Inpatient. Neck cellulitis, with clinical concern for necrotizing fasciitis. EXAM: CT CHEST WITHOUT CONTRAST TECHNIQUE: Multidetector CT imaging of the chest was performed following the standard protocol without IV contrast. COMPARISON:  06/16/2019 chest radiograph.  06/12/2019 chest CT. FINDINGS: Cardiovascular: Mild cardiomegaly. No significant pericardial effusion/thickening. Three-vessel coronary atherosclerosis. Atherosclerotic nonaneurysmal thoracic aorta. Normal caliber pulmonary arteries. Mediastinum/Nodes: No discrete thyroid nodules. Enteric tube enters the stomach with the tip not seen on this image. No axillary adenopathy. Mildly enlarged 1.1 cm high right paratracheal node (series 1/image 27), stable using similar measurement technique. Stable mildly enlarged 1.1 cm AP window node (series 1/image 55). No new pathologically enlarged mediastinal nodes. No discrete hilar adenopathy on this noncontrast scan. No pneumomediastinum. Lungs/Pleura: No pneumothorax. Small dependent bilateral pleural effusions, left greater than right, new since 06/12/2019 chest CT. Endotracheal tube tip is 7.9 cm above the carina. New complete left lower lobe atelectasis. New moderate dependent right lower lobe atelectasis. New moderate patchy ground-glass opacities throughout both lungs, most prominent in the upper lobes. New mild interlobular septal thickening throughout both lungs. New mild patchy region of centrilobular micronodularity in the peripheral right upper lobe. No lung masses. Upper abdomen: No acute abnormality. Musculoskeletal: No aggressive appearing focal osseous lesions. Acute anterior right second, third, fourth, fifth and sixth rib fractures. Acute anterior left  second, third and fourth rib fractures. Mild thoracic spondylosis. No subcutaneous or soft tissue emphysema in the chest wall. Mild skin thickening with associated subcutaneous fat stranding in the ventral high left chest wall, similar to prior chest CT. IMPRESSION: 1. No subcutaneous or soft tissue emphysema in the chest wall to suggest necrotizing fasciitis in the chest. Stable mild skin thickening with associated subcutaneous fat stranding in the high ventral left chest wall. Please see the separate concurrent neck CT report for details regarding the neck. 2. Cardiomegaly. New small dependent bilateral pleural effusions. New mild interlobular septal thickening. New patchy ground-glass opacities in both lungs predominantly in the upper lobes. New patchy centrilobular micronodularity in the right upper lobe. This spectrum of findings is most suggestive of acute congestive heart failure. A component of aspiration cannot be excluded given the micronodularity in the right upper lobe. 3. Acute anterior upper rib fractures bilaterally as detailed. No pneumothorax. 4. Three-vessel coronary atherosclerosis. 5. Nonspecific mild mediastinal lymphadenopathy is stable. 6. Bilateral lower lobe atelectasis. Aortic Atherosclerosis (ICD10-I70.0). Electronically Signed   By: Ilona Sorrel M.D.   On: 06/19/2019 12:37      A: acute resp failure with upper airway compromise needing trach - due to left neck cellulitis/left chest cellulitis v nec fasc - improiving  H Flu bacteremie  Acute metabolic encephalopathy due to above + baseline etoch hx  P: WEan fent gtt to off if pssible Hold off beta blocker Continue unasyn - establish with ID stop target REcheck with ENT about indication for debridement  LTAC referral    Anti-infectives (From admission, onward)   Start     Dose/Rate Route Frequency Ordered Stop   06/13/19 1400  Ampicillin-Sulbactam (UNASYN) 3 g in sodium chloride 0.9 % 100 mL IVPB     3 g 200 mL/hr over  30 Minutes Intravenous Every 6 hours 06/13/19 0918     06/12/19 2200  vancomycin (VANCOCIN) IVPB 1000 mg/200 mL premix  Status:  Discontinued     1,000 mg 200 mL/hr over 60 Minutes Intravenous Every 12 hours 06/12/19 1115 06/13/19 0844   06/12/19 1900  piperacillin-tazobactam (ZOSYN) IVPB 3.375 g  Status:  Discontinued     3.375 g 12.5 mL/hr over 240 Minutes Intravenous Every 8 hours 06/12/19 1249 06/13/19 0918   06/12/19 1500  clindamycin (CLEOCIN) IVPB 600 mg  Status:  Discontinued     600 mg 100 mL/hr over 30 Minutes Intravenous Every 8 hours 06/12/19 1224 06/13/19 0844   06/12/19 1300  piperacillin-tazobactam (ZOSYN) IVPB 3.375 g  Status:  Discontinued     3.375 g 100 mL/hr over 30 Minutes Intravenous  Once 06/12/19 1249 06/13/19 0918   06/12/19 1045  azithromycin (ZITHROMAX) 500 mg in sodium chloride 0.9 % 250 mL IVPB  Status:  Discontinued     500 mg 250 mL/hr over 60 Minutes Intravenous  Once 06/12/19 1031 06/12/19 1231   06/12/19 0915  Ampicillin-Sulbactam (UNASYN) 3 g in sodium chloride 0.9 % 100 mL IVPB  Status:  Discontinued     3 g 200 mL/hr over 30 Minutes Intravenous Every 6 hours 06/12/19 0910 06/12/19 1224   06/12/19 0915  vancomycin (VANCOREADY) IVPB 1500 mg/300 mL     1,500 mg 150 mL/hr over 120 Minutes Intravenous  Once 06/12/19 0915 06/12/19 1202       Rest per NP/medical resident whose note is outlined above and that I agree with  The patient is critically ill with multiple organ systems failure and requires high complexity decision making for assessment and support, frequent evaluation and titration of therapies, application of advanced monitoring technologies and extensive interpretation of multiple databases.   Critical Care Time devoted to patient care services described in this note is  30  Minutes. This time reflects time of care of this signee Dr Brand Males. This critical care time does not reflect procedure time, or teaching time or supervisory time of  PA/NP/Med student/Med Resident etc but could involve care discussion time     Dr. Brand Males, M.D., [facility].C.P Pulmonary and Critical Care Medicine Staff Physician [facility] Pager: [phone], If no answer or between  15:00h - 7:00h: call [phone]  06/20/2019 9:41 AM

## 2019-06-20 NOTE — Care Management (Addendum)
CM acknowledges consult for [facility] however pt does not have LTACH benefit with Medicaid.  If pt requires placement at discharge - facility will be a SNF.   Pt will need stability on trach/vent prior to SNF process can begin.

## 2019-06-21 LAB — GLUCOSE, CAPILLARY
Glucose-Capillary: 106 mg/dL — ABNORMAL HIGH (ref 70–99)
Glucose-Capillary: 113 mg/dL — ABNORMAL HIGH (ref 70–99)
Glucose-Capillary: 114 mg/dL — ABNORMAL HIGH (ref 70–99)
Glucose-Capillary: 117 mg/dL — ABNORMAL HIGH (ref 70–99)
Glucose-Capillary: 121 mg/dL — ABNORMAL HIGH (ref 70–99)
Glucose-Capillary: 125 mg/dL — ABNORMAL HIGH (ref 70–99)
Glucose-Capillary: 125 mg/dL — ABNORMAL HIGH (ref 70–99)

## 2019-06-21 LAB — BASIC METABOLIC PANEL
Anion gap: 9 (ref 5–15)
BUN: 10 mg/dL (ref 6–20)
CO2: 29 mmol/L (ref 22–32)
Calcium: 8.4 mg/dL — ABNORMAL LOW (ref 8.9–10.3)
Chloride: 106 mmol/L (ref 98–111)
Creatinine, Ser: 0.72 mg/dL (ref 0.61–1.24)
GFR calc Af Amer: >60 mL/min (ref >60)
GFR calc non Af Amer: >60 mL/min (ref >60)
Glucose, Bld: 134 mg/dL — ABNORMAL HIGH (ref 70–99)
Potassium: 3.7 mmol/L (ref 3.5–5.1)
Sodium: 144 mmol/L (ref 135–145)

## 2019-06-21 LAB — CBC
HCT: 29.6 % — ABNORMAL LOW (ref 39.0–52.0)
Hemoglobin: 8.4 g/dL — ABNORMAL LOW (ref 13.0–17.0)
MCH: 23.3 pg — ABNORMAL LOW (ref 26.0–34.0)
MCHC: 28.4 g/dL — ABNORMAL LOW (ref 30.0–36.0)
MCV: 82 fL (ref 80.0–100.0)
Platelets: 258 10*3/uL (ref 150–400)
RBC: 3.61 MIL/uL — ABNORMAL LOW (ref 4.22–5.81)
RDW: 20.6 % — ABNORMAL HIGH (ref 11.5–15.5)
WBC: 9.3 10*3/uL (ref 4.0–10.5)
nRBC: 0.3 % — ABNORMAL HIGH (ref 0.0–0.2)

## 2019-06-21 MED ORDER — CLONAZEPAM 0.5 MG PO TBDP
1.0000 mg | ORAL_TABLET | Freq: Two times a day (BID) | ORAL | Status: DC
  Administered 2019-06-21 – 2019-06-26 (×10): 1 mg
  Filled 2019-06-21 (×10): qty 2

## 2019-06-21 MED ORDER — POTASSIUM CHLORIDE 20 MEQ/15ML (10%) PO SOLN
40.0000 meq | Freq: Once | ORAL | Status: AC
  Administered 2019-06-21: 10:00:00 40 meq via ORAL
  Filled 2019-06-21: qty 30

## 2019-06-21 NOTE — Progress Notes (Signed)
I spoke with [name], Patient's contact, and gave updates on [name]. We discussed his labs and condition. All questions and concerns were addressed.  Maudie Mercury, MD IMTS, PGY-1 Pager: [phone] 06/21/2019,1:22 PM
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
## 2019-06-22 ENCOUNTER — Inpatient Hospital Stay (HOSPITAL_COMMUNITY): Payer: Medicaid Other

## 2019-06-22 ENCOUNTER — Inpatient Hospital Stay: Payer: Self-pay

## 2019-06-22 LAB — BASIC METABOLIC PANEL
Anion gap: 10 (ref 5–15)
BUN: 9 mg/dL (ref 6–20)
CO2: 27 mmol/L (ref 22–32)
Calcium: 8.3 mg/dL — ABNORMAL LOW (ref 8.9–10.3)
Chloride: 101 mmol/L (ref 98–111)
Creatinine, Ser: 0.46 mg/dL — ABNORMAL LOW (ref 0.61–1.24)
GFR calc Af Amer: >60 mL/min (ref >60)
GFR calc non Af Amer: >60 mL/min (ref >60)
Glucose, Bld: 125 mg/dL — ABNORMAL HIGH (ref 70–99)
Potassium: 4.2 mmol/L (ref 3.5–5.1)
Sodium: 138 mmol/L (ref 135–145)

## 2019-06-22 LAB — COMPREHENSIVE METABOLIC PANEL
ALT: 20 U/L (ref 0–44)
AST: 20 U/L (ref 15–41)
Albumin: 1.4 g/dL — ABNORMAL LOW (ref 3.5–5.0)
Alkaline Phosphatase: 145 U/L — ABNORMAL HIGH (ref 38–126)
Anion gap: 9 (ref 5–15)
BUN: 8 mg/dL (ref 6–20)
CO2: 29 mmol/L (ref 22–32)
Calcium: 8.3 mg/dL — ABNORMAL LOW (ref 8.9–10.3)
Chloride: 103 mmol/L (ref 98–111)
Creatinine, Ser: 0.62 mg/dL (ref 0.61–1.24)
GFR calc Af Amer: >60 mL/min (ref >60)
GFR calc non Af Amer: >60 mL/min (ref >60)
Glucose, Bld: 132 mg/dL — ABNORMAL HIGH (ref 70–99)
Potassium: 3.4 mmol/L — ABNORMAL LOW (ref 3.5–5.1)
Sodium: 141 mmol/L (ref 135–145)
Total Bilirubin: 0.4 mg/dL (ref 0.3–1.2)
Total Protein: 5.4 g/dL — ABNORMAL LOW (ref 6.5–8.1)

## 2019-06-22 LAB — GLUCOSE, CAPILLARY
Glucose-Capillary: 117 mg/dL — ABNORMAL HIGH (ref 70–99)
Glucose-Capillary: 105 mg/dL — ABNORMAL HIGH (ref 70–99)
Glucose-Capillary: 122 mg/dL — ABNORMAL HIGH (ref 70–99)
Glucose-Capillary: 133 mg/dL — ABNORMAL HIGH (ref 70–99)
Glucose-Capillary: 105 mg/dL — ABNORMAL HIGH (ref 70–99)
Glucose-Capillary: 122 mg/dL — ABNORMAL HIGH (ref 70–99)

## 2019-06-22 LAB — CBC
HCT: 24.4 % — ABNORMAL LOW (ref 39.0–52.0)
Hemoglobin: 6.9 g/dL — CL (ref 13.0–17.0)
MCH: 23.1 pg — ABNORMAL LOW (ref 26.0–34.0)
MCHC: 28.3 g/dL — ABNORMAL LOW (ref 30.0–36.0)
MCV: 81.6 fL (ref 80.0–100.0)
Platelets: 473 10*3/uL — ABNORMAL HIGH (ref 150–400)
RBC: 2.99 MIL/uL — ABNORMAL LOW (ref 4.22–5.81)
RDW: 19.9 % — ABNORMAL HIGH (ref 11.5–15.5)
WBC: 10.7 10*3/uL — ABNORMAL HIGH (ref 4.0–10.5)
nRBC: 0.3 % — ABNORMAL HIGH (ref 0.0–0.2)

## 2019-06-22 LAB — HEMOGLOBIN AND HEMATOCRIT, BLOOD
HCT: 26.4 % — ABNORMAL LOW (ref 39.0–52.0)
Hemoglobin: 7.9 g/dL — ABNORMAL LOW (ref 13.0–17.0)

## 2019-06-22 LAB — LIPASE, BLOOD: Lipase: 26 U/L (ref 11–51)

## 2019-06-22 LAB — CK: Total CK: 47 U/L — ABNORMAL LOW (ref 49–397)

## 2019-06-22 LAB — LACTIC ACID, PLASMA: Lactic Acid, Venous: 0.8 mmol/L (ref 0.5–1.9)

## 2019-06-22 LAB — URIC ACID: Uric Acid, Serum: 3.7 mg/dL (ref 3.7–8.6)

## 2019-06-22 LAB — PREPARE RBC (CROSSMATCH)

## 2019-06-22 LAB — AMYLASE: Amylase: 49 U/L (ref 28–100)

## 2019-06-22 LAB — MAGNESIUM: Magnesium: 2 mg/dL (ref 1.7–2.4)

## 2019-06-22 MED ORDER — POTASSIUM CHLORIDE 20 MEQ PO PACK: 20.0000 meq | PACK | Freq: Once | ORAL | Status: DC

## 2019-06-22 MED ORDER — POTASSIUM CHLORIDE 20 MEQ/15ML (10%) PO SOLN
20.0000 meq | Freq: Once | ORAL | Status: AC
  Administered 2019-06-22: 20 meq via ORAL
  Filled 2019-06-22: qty 15

## 2019-06-22 MED ORDER — POTASSIUM CHLORIDE 20 MEQ/15ML (10%) PO SOLN
40.0000 meq | Freq: Once | ORAL | Status: AC
  Administered 2019-06-22: 40 meq via ORAL
  Filled 2019-06-22: qty 30

## 2019-06-22 MED ORDER — SODIUM CHLORIDE 0.9% FLUSH
10.0000 mL | INTRAVENOUS | Status: DC | PRN
  Administered 2019-07-23: 10 mL

## 2019-06-22 MED ORDER — SODIUM CHLORIDE 0.9% FLUSH
10.0000 mL | Freq: Two times a day (BID) | INTRAVENOUS | Status: DC
  Administered 2019-06-22 – 2019-06-30 (×5): 10 mL

## 2019-06-22 MED ORDER — POTASSIUM CHLORIDE 20 MEQ PO PACK
40.0000 meq | PACK | Freq: Once | ORAL | Status: DC
  Filled 2019-06-22: qty 2

## 2019-06-22 MED ORDER — POTASSIUM CHLORIDE 10 MEQ/100ML IV SOLN: 10.0000 meq | INTRAVENOUS | Status: DC

## 2019-06-22 MED ORDER — SENNA 8.6 MG PO TABS: 1.0000 | ORAL_TABLET | Freq: Every day | ORAL | Status: DC | PRN

## 2019-06-22 MED ORDER — SODIUM CHLORIDE 0.9 % IV SOLN
INTRAVENOUS | Status: DC | PRN
  Administered 2019-06-22 – 2019-06-28 (×2): 250 mL via INTRAVENOUS
  Administered 2019-07-15: 1000 mL via INTRAVENOUS

## 2019-06-22 MED ORDER — POLYETHYLENE GLYCOL 3350 17 G PO PACK
17.0000 g | PACK | Freq: Every day | ORAL | Status: DC
  Administered 2019-06-23 – 2019-06-25 (×3): 17 g via ORAL
  Filled 2019-06-22 (×4): qty 1

## 2019-06-22 MED ORDER — POTASSIUM CHLORIDE 20 MEQ PO PACK
20.0000 meq | PACK | Freq: Once | ORAL | Status: DC
  Filled 2019-06-22: qty 1

## 2019-06-22 MED ORDER — SODIUM CHLORIDE 0.9% IV SOLUTION: Freq: Once | INTRAVENOUS | Status: AC

## 2019-06-22 NOTE — Progress Notes (Signed)
Physical Therapy Treatment Patient Details Name: Carlo Hopkins. MRN: [id number] DOB: 1960/09/28 Today's Date: 06/22/2019    History of Present Illness 59 year old man with a history of alcohol use disorder, tobacco used disorder, HTN, and recent admission on 04/10/2019 for pancreatitis and hyponatremia who presents for evaluation of shortness of breath and neck swelling.     PT Comments    Patient progressing with level of alertness and moving in the bed, unfortunately too agitated to safely progress to EOB this session as pt pushing back and pulling at lines when hands not restrained.  Feel he should continue to progress as mental status improves.  PT to follow and continue to recommend SNF level rehab at d/c unless he becomes more cognitively appropriate for CIR.     Follow Up Recommendations  SNF;Supervision for mobility/OOB     Equipment Recommendations  None recommended by PT(TBD)    Recommendations for Other Services       Precautions / Restrictions Precautions Precautions: Fall;Other (comment) Precaution Comments: vent via trach, bilat wrist restraints and hand mitts Restrictions Weight Bearing Restrictions: No    Mobility  Bed Mobility Overal bed mobility: Needs Assistance Bed Mobility: Supine to Sit     Supine to sit: +2 for physical assistance;Max assist     General bed mobility comments: unable to get into sitting, pt pushing back when attempting to assist to sitting EOB, then fighting and pushing needing increased time, assist to scoot up in bed, reapply wrist restraints and replace vent tubing to trach as pt dislodged several times unintentionally  Transfers                 General transfer comment: deferred  Ambulation/Gait                 Stairs             Wheelchair Mobility    Modified Rankin (Stroke Patients Only)       Balance                                            Cognition  Arousal/Alertness: Awake/alert Behavior During Therapy: Agitated Overall Cognitive Status: Difficult to assess                                        Exercises      General Comments General comments (skin integrity, edema, etc.): during session pt on PS/CPAP with 40% O2 and PEEp of 5, HR max 180 with agitation during mobility. down to 120's end of session, RN aware      Pertinent Vitals/Pain Pain Assessment: Faces Faces Pain Scale: Hurts even more Pain Location: pushing back with attempted supine to sit Pain Descriptors / Indicators: Discomfort;Grimacing Pain Intervention(s): Monitored during session;Limited activity within patient's tolerance;Repositioned    Home Living                      Prior Function            PT Goals (current goals can now be found in the care plan section) Progress towards PT goals: Progressing toward goals    Frequency    Min 2X/week      PT Plan Current plan remains appropriate    Co-evaluation  AM-PAC PT "6 Clicks" Mobility   Outcome Measure  Help needed turning from your back to your side while in a flat bed without using bedrails?: Total Help needed moving from lying on your back to sitting on the side of a flat bed without using bedrails?: Total Help needed moving to and from a bed to a chair (including a wheelchair)?: Total Help needed standing up from a chair using your arms (e.g., wheelchair or bedside chair)?: Total Help needed to walk in hospital room?: Total Help needed climbing 3-5 steps with a railing? : Total 6 Click Score: 6    End of Session Equipment Utilized During Treatment: Oxygen Activity Tolerance: Treatment limited secondary to agitation Patient left: in bed;with restraints reapplied   PT Visit Diagnosis: Muscle weakness (generalized) (M62.81);Difficulty in walking, not elsewhere classified (R26.2)     Time: [id number] PT Time Calculation (min) (ACUTE ONLY): 29  min  Charges:  $Therapeutic Activity: 23-37 mins                     Magda Kiel, [place] [facility] [phone] 06/22/2019    Reginia Naas 06/22/2019, 12:35 PM
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
## 2019-06-22 NOTE — Progress Notes (Signed)
Received order for PICC  Pt. In MRI at present.  Plan to place PICC later today.

## 2019-06-22 NOTE — Progress Notes (Signed)
CRITICAL VALUE ALERT  Critical Value:  hgb 6.9  Date & Time Notied:  06/22/19 @ [id number]  Provider Notified: Warren Lacy MD  Orders Received/Actions taken: On standby for potential orders  [name]

## 2019-06-22 NOTE — Progress Notes (Signed)
[place] Progress Note Patient Name: [name]. DOB: 07-Sep-1960 MRN: [id number]   Date of Service  06/22/2019  HPI/Events of Note  Hemoglobin 6.9, no active bleeding endorsed  eICU Interventions  1 unit RBC and post transfusion h/h ordered     Intervention Category Intermediate Interventions: Diagnostic test evaluation  Margaretmary Lombard 06/22/2019, 4:53 AM

## 2019-06-22 NOTE — Progress Notes (Addendum)
Patient's daughter updated via phone regarding clinical status.  Discussed continued need for antibiotics and ventilator support. Updated on new concern of right foot cellulitis vs gout. Per daughter, patient does not have known history of gout. Discussed that we will obtain further labs and imaging at this point. Also discussed need for PICC line placement. Daughter expresses understanding and gave consent for PICC line placement over the phone. All questions and concerns addressed.   Harvie Heck, MD Internal Medicine, PGY-1 06/22/19  1:14 PM

## 2019-06-22 NOTE — Progress Notes (Signed)
New redness and swelling noted right inner ankle, rt second toe, and rt top of foot. Dr. Chase Caller notified and he assessed at bedside.

## 2019-06-22 NOTE — Progress Notes (Signed)
RT note-Attempted to wean, unable, abdomen distended, MD aware.

## 2019-06-23 ENCOUNTER — Inpatient Hospital Stay (HOSPITAL_COMMUNITY): Payer: Medicaid Other

## 2019-06-23 LAB — SYNOVIAL CELL COUNT + DIFF, W/ CRYSTALS
Eosinophils-Synovial: 0 % (ref 0–1)
Lymphocytes-Synovial Fld: 1 % (ref 0–20)
Monocyte-Macrophage-Synovial Fluid: 7 % — ABNORMAL LOW (ref 50–90)
Neutrophil, Synovial: 92 % — ABNORMAL HIGH (ref 0–25)
WBC, Synovial: 3675 /mm3 — ABNORMAL HIGH (ref 0–200)

## 2019-06-23 LAB — TYPE AND SCREEN
ABO/RH(D): AB POS
Antibody Screen: NEGATIVE
Unit division: 0

## 2019-06-23 LAB — BASIC METABOLIC PANEL
Anion gap: 8 (ref 5–15)
BUN: 7 mg/dL (ref 6–20)
CO2: 27 mmol/L (ref 22–32)
Calcium: 8.4 mg/dL — ABNORMAL LOW (ref 8.9–10.3)
Chloride: 104 mmol/L (ref 98–111)
Creatinine, Ser: 0.68 mg/dL (ref 0.61–1.24)
GFR calc Af Amer: >60 mL/min (ref >60)
GFR calc non Af Amer: >60 mL/min (ref >60)
Glucose, Bld: 115 mg/dL — ABNORMAL HIGH (ref 70–99)
Potassium: 4 mmol/L (ref 3.5–5.1)
Sodium: 139 mmol/L (ref 135–145)

## 2019-06-23 LAB — GLUCOSE, CAPILLARY
Glucose-Capillary: 105 mg/dL — ABNORMAL HIGH (ref 70–99)
Glucose-Capillary: 132 mg/dL — ABNORMAL HIGH (ref 70–99)
Glucose-Capillary: 125 mg/dL — ABNORMAL HIGH (ref 70–99)
Glucose-Capillary: 113 mg/dL — ABNORMAL HIGH (ref 70–99)
Glucose-Capillary: 126 mg/dL — ABNORMAL HIGH (ref 70–99)
Glucose-Capillary: 107 mg/dL — ABNORMAL HIGH (ref 70–99)

## 2019-06-23 LAB — BPAM RBC
Blood Product Expiration Date: 202101312359
ISSUE DATE / TIME: 202101140844
Unit Type and Rh: 6200

## 2019-06-23 LAB — CULTURE, RESPIRATORY W GRAM STAIN

## 2019-06-23 LAB — CBC
HCT: 25.5 % — ABNORMAL LOW (ref 39.0–52.0)
Hemoglobin: 7.5 g/dL — ABNORMAL LOW (ref 13.0–17.0)
MCH: 23.9 pg — ABNORMAL LOW (ref 26.0–34.0)
MCHC: 29.4 g/dL — ABNORMAL LOW (ref 30.0–36.0)
MCV: 81.2 fL (ref 80.0–100.0)
Platelets: 512 10*3/uL — ABNORMAL HIGH (ref 150–400)
RBC: 3.14 MIL/uL — ABNORMAL LOW (ref 4.22–5.81)
RDW: 19.4 % — ABNORMAL HIGH (ref 11.5–15.5)
WBC: 9.8 10*3/uL (ref 4.0–10.5)
nRBC: 0 % (ref 0.0–0.2)

## 2019-06-23 MED ORDER — HEPARIN SODIUM (PORCINE) 5000 UNIT/ML IJ SOLN
5000.0000 [IU] | Freq: Three times a day (TID) | INTRAMUSCULAR | Status: DC
  Administered 2019-06-23 – 2019-08-04 (×123): 5000 [IU] via SUBCUTANEOUS
  Filled 2019-06-23 (×122): qty 1

## 2019-06-23 MED ORDER — NAPROXEN 250 MG PO TABS
500.0000 mg | ORAL_TABLET | Freq: Two times a day (BID) | ORAL | Status: DC
  Administered 2019-06-23 – 2019-06-25 (×4): 500 mg
  Filled 2019-06-23 (×5): qty 2

## 2019-06-23 MED ORDER — QUETIAPINE FUMARATE 50 MG PO TABS
100.0000 mg | ORAL_TABLET | Freq: Two times a day (BID) | ORAL | Status: DC
  Administered 2019-06-23 – 2019-06-27 (×9): 100 mg
  Filled 2019-06-23 (×9): qty 2

## 2019-06-23 MED ORDER — CHLORHEXIDINE GLUCONATE CLOTH 2 % EX PADS
6.0000 | MEDICATED_PAD | Freq: Every day | CUTANEOUS | Status: DC
  Administered 2019-06-23 – 2019-07-31 (×40): 6 via TOPICAL

## 2019-06-23 MED ORDER — PREDNISONE 10 MG PO TABS
10.0000 mg | ORAL_TABLET | Freq: Every day | ORAL | Status: AC
  Administered 2019-06-28 – 2019-06-29 (×2): 10 mg
  Filled 2019-06-23 (×2): qty 1

## 2019-06-23 MED ORDER — SODIUM CHLORIDE 0.9% FLUSH
10.0000 mL | INTRAVENOUS | Status: DC | PRN
  Administered 2019-06-30 – 2019-07-27 (×2): 10 mL

## 2019-06-23 MED ORDER — PREDNISONE 5 MG PO TABS
5.0000 mg | ORAL_TABLET | Freq: Every day | ORAL | Status: AC
  Administered 2019-06-30 – 2019-07-01 (×2): 5 mg
  Filled 2019-06-23 (×2): qty 1

## 2019-06-23 MED ORDER — QUETIAPINE FUMARATE 50 MG PO TABS
50.0000 mg | ORAL_TABLET | Freq: Once | ORAL | Status: AC
  Administered 2019-06-23: 10:00:00 50 mg
  Filled 2019-06-23: qty 1

## 2019-06-23 MED ORDER — PREDNISONE 20 MG PO TABS
20.0000 mg | ORAL_TABLET | Freq: Every day | ORAL | Status: AC
  Administered 2019-06-26 – 2019-06-27 (×2): 20 mg
  Filled 2019-06-23 (×2): qty 1

## 2019-06-23 MED ORDER — GADOBUTROL 1 MMOL/ML IV SOLN
9.0000 mL | Freq: Once | INTRAVENOUS | Status: AC | PRN
  Administered 2019-06-23: 9 mL via INTRAVENOUS

## 2019-06-23 MED ORDER — SODIUM CHLORIDE 0.9% FLUSH
10.0000 mL | Freq: Two times a day (BID) | INTRAVENOUS | Status: DC
  Administered 2019-06-23 – 2019-07-01 (×12): 10 mL
  Administered 2019-07-02: 40 mL
  Administered 2019-07-02: 10:00:00 10 mL
  Administered 2019-07-03: 23:00:00 20 mL
  Administered 2019-07-03 – 2019-07-04 (×3): 10 mL
  Administered 2019-07-05: 40 mL
  Administered 2019-07-05: 10 mL
  Administered 2019-07-06: 40 mL
  Administered 2019-07-06 – 2019-07-19 (×18): 10 mL
  Administered 2019-07-20: 30 mL
  Administered 2019-07-20: 10 mL
  Administered 2019-07-21: 20 mL
  Administered 2019-07-21 – 2019-08-02 (×22): 10 mL

## 2019-06-23 MED ORDER — PREDNISONE 20 MG PO TABS
40.0000 mg | ORAL_TABLET | Freq: Every day | ORAL | Status: AC
  Administered 2019-06-24 – 2019-06-25 (×2): 40 mg
  Filled 2019-06-23 (×2): qty 2

## 2019-06-23 NOTE — Progress Notes (Signed)
Peripherally Inserted Central Catheter/Midline Placement  The IV Nurse has discussed with the patient and/or persons authorized to consent for the patient, the purpose of this procedure and the potential benefits and risks involved with this procedure.  The benefits include less needle sticks, lab draws from the catheter, and the patient may be discharged home with the catheter. Risks include, but not limited to, infection, bleeding, blood clot (thrombus formation), and puncture of an artery; nerve damage and irregular heartbeat and possibility to perform a PICC exchange if needed/ordered by physician.  Alternatives to this procedure were also discussed.  Bard Power PICC patient education guide, fact sheet on infection prevention and patient information card has been provided to patient /or left at bedside.    PICC/Midline Placement Documentation  PICC Double Lumen 06/23/19 Right Brachial 37 cm 0 cm (Active)  Indication for Insertion or Continuance of Line Poor Vasculature-patient has had multiple peripheral attempts or PIVs lasting less than 24 hours 06/23/19 0845  Exposed Catheter (cm) 0 cm 06/23/19 0845  Site Assessment Clean;Dry;Intact 06/23/19 0845  Lumen #1 Status Flushed;Blood return noted;Saline locked 06/23/19 0845  Lumen #2 Status Flushed;Blood return noted;Saline locked 06/23/19 0845  Dressing Type Transparent 06/23/19 0845  Dressing Status Clean;Dry;Intact;Antimicrobial disc in place 06/23/19 0845  Dressing Change Due 06/30/19 06/23/19 0845       Scotty Court 06/23/2019, 8:47 AM
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
## 2019-06-23 NOTE — Consult Note (Signed)
Reason for Consult:Right ankle swelling Referring Physician: Ann Pugh  William Glendle Auvil. is an 59 y.o. male.  HPI: [name] was admitted 11d ago with neck and chest cellulitis. He has had a very rocky hospital course. Yesterday he was noted to have new swelling in his right ankle. MRI was performed which showed mainly cellulitis but there was a small effusion and some e/o synovitis and orthopedic surgery was consulted for arthrocentesis. He remains on the ventilator and cannot contribute to history.  Past Medical History:  Diagnosis Date  . Alcohol abuse   . Alcoholic ([place])   . Back pain   . CAP (community acquired pneumonia) 11/26/2018  . COPD (chronic obstructive pulmonary disease) ([place])   . Empyema of right pleural space ([place]) 11/26/2018  . Heavy smoker   . Hep C w/ coma, chronic ([place])   . Loose, teeth   . Pancreatitis   . Poor dentition   . Tobacco abuse     Past Surgical History:  Procedure Laterality Date  . ARTERY REPAIR  2013   neck  . BIOPSY  02/19/2019   Procedure: BIOPSY;  Surgeon: William Juniper, MD;  Location: [facility] ENDOSCOPY;  Service: Gastroenterology;;  . ESOPHAGOGASTRODUODENOSCOPY (EGD) WITH PROPOFOL N/A 02/19/2019   Procedure: ESOPHAGOGASTRODUODENOSCOPY (EGD) WITH PROPOFOL;  Surgeon: William Juniper, MD;  Location: [facility];  Service: Gastroenterology;  Laterality: N/A;  . FOREIGN BODY REMOVAL  02/19/2019   Procedure: FOREIGN BODY REMOVAL;  Surgeon: William Juniper, MD;  Location: [facility] ENDOSCOPY;  Service: Gastroenterology;;  . KNEE SURGERY  2013   rt  . MICROLARYNGOSCOPY N/A 07/31/2014   Procedure: MICROLARYNGOSCOPY WITH BIOPSY ;  Surgeon: William Nunnery, MD;  Location: [facility];  Service: ENT;  Laterality: N/A;  . NECK SURGERY  2013   cervical fusion-  . VASCULAR SURGERY    . VIDEO ASSISTED THORACOSCOPY (VATS)/EMPYEMA Right 11/26/2018   Procedure: VIDEO ASSISTED THORACOSCOPY (VATS)/EMPYEMA;  Surgeon: William Alberts, MD;  Location: [facility];  Service:  Thoracic;  Laterality: Right;  [name] VIDEO BRONCHOSCOPY  11/26/2018   Procedure: Video Bronchoscopy;  Surgeon: William Alberts, MD;  Location: [facility];  Service: Thoracic;;    No family history on file.  Social History:  reports that he has been smoking cigarettes. He has been smoking about 2.00 packs per day. He has never used smokeless tobacco. He reports current alcohol use. He reports that he does not use drugs.  Allergies: No Active Allergies  Medications: I have reviewed the patient'Pugh current medications.  Results for orders placed or performed during the hospital encounter of 06/12/19 (from the past 48 hour(Pugh))  Glucose, capillary     Status: Abnormal   Collection Time: 06/21/19  3:19 PM  Result Value Ref Range   Glucose-Capillary 114 (H) 70 - 99 mg/dL  Glucose, capillary     Status: Abnormal   Collection Time: 06/21/19  7:12 PM  Result Value Ref Range   Glucose-Capillary 117 (H) 70 - 99 mg/dL  Glucose, capillary     Status: Abnormal   Collection Time: 06/21/19 11:17 PM  Result Value Ref Range   Glucose-Capillary 121 (H) 70 - 99 mg/dL  Glucose, capillary     Status: Abnormal   Collection Time: 06/22/19  3:19 AM  Result Value Ref Range   Glucose-Capillary 117 (H) 70 - 99 mg/dL  AM CBC     Status: Abnormal   Collection Time: 06/22/19  4:04 AM  Result Value Ref Range   WBC 10.7 (H)  4.0 - 10.5 K/uL   RBC 2.99 (L) 4.22 - 5.81 MIL/uL   Hemoglobin 6.9 (LL) 13.0 - 17.0 g/dL    Comment: REPEATED TO VERIFY THIS CRITICAL RESULT HAS VERIFIED AND BEEN CALLED TO William S RN BY SAINVIL SAINVILUS ON 01 14 2021 AT 0442, AND HAS BEEN READ BACK.     HCT 24.4 (L) 39.0 - 52.0 %   MCV 81.6 80.0 - 100.0 fL   MCH 23.1 (L) 26.0 - 34.0 pg   MCHC 28.3 (L) 30.0 - 36.0 g/dL   RDW 19.9 (H) 11.5 - 15.5 %   Platelets 473 (H) 150 - 400 K/uL    Comment: REPEATED TO VERIFY   nRBC 0.3 (H) 0.0 - 0.2 %    Comment: Performed at [facility] [address], [place], [place] [address]  Comprehensive  metabolic panel     Status: Abnormal   Collection Time: 06/22/19  4:04 AM  Result Value Ref Range   Sodium 141 135 - 145 mmol/L   Potassium 3.4 (L) 3.5 - 5.1 mmol/L   Chloride 103 98 - 111 mmol/L   CO2 29 22 - 32 mmol/L   Glucose, Bld 132 (H) 70 - 99 mg/dL   BUN 8 6 - 20 mg/dL   Creatinine, Ser 0.62 0.61 - 1.24 mg/dL   Calcium 8.3 (L) 8.9 - 10.3 mg/dL   Total Protein 5.4 (L) 6.5 - 8.1 g/dL   Albumin 1.4 (L) 3.5 - 5.0 g/dL   AST 20 15 - 41 U/L   ALT 20 0 - 44 U/L   Alkaline Phosphatase 145 (H) 38 - 126 U/L   Total Bilirubin 0.4 0.3 - 1.2 mg/dL   GFR calc non Af Amer >60 >60 mL/min   GFR calc Af Amer >60 >60 mL/min   Anion gap 9 5 - 15    Comment: Performed at [facility] [address], [place], [place] [address]  Magnesium     Status: None   Collection Time: 06/22/19  4:04 AM  Result Value Ref Range   Magnesium 2.0 1.7 - 2.4 mg/dL    Comment: Performed at [facility] [address], [place], [place] [address]  Prepare RBC     Status: None   Collection Time: 06/22/19  7:00 AM  Result Value Ref Range   Order Confirmation      ORDER PROCESSED BY BLOOD BANK Performed at [facility], [place] [address] [place],  [address]   Type and screen [place]     Status: None   Collection Time: 06/22/19  7:01 AM  Result Value Ref Range   ABO/RH(D) AB POS    Antibody Screen NEG    Sample Expiration 06/25/2019,2359    Unit Number [id number]    Blood Component Type RED CELLS,LR    Unit division 00    Status of Unit ISSUED,FINAL    Transfusion Status OK TO TRANSFUSE    Crossmatch Result      Compatible Performed at [facility], [place] [address], [place], [place] [address]   Glucose, capillary     Status: Abnormal   Collection Time: 06/22/19  7:51 AM  Result Value Ref Range   Glucose-Capillary 105 (H) 70 - 99 mg/dL  Glucose, capillary     Status: Abnormal   Collection Time: 06/22/19 11:43 AM  Result Value Ref Range    Glucose-Capillary 122 (H) 70 - 99 mg/dL  Lactic acid, plasma  Status: None   Collection Time: 06/22/19  1:33 PM  Result Value Ref Range   Lactic Acid, Venous 0.8 0.5 - 1.9 mmol/L    Comment: Performed at [facility] [address] [place], [place] [id number]  Basic metabolic panel     Status: Abnormal   Collection Time: 06/22/19  1:33 PM  Result Value Ref Range   Sodium 138 135 - 145 mmol/L   Potassium 4.2 3.5 - 5.1 mmol/L   Chloride 101 98 - 111 mmol/L   CO2 27 22 - 32 mmol/L   Glucose, Bld 125 (H) 70 - 99 mg/dL   BUN 9 6 - 20 mg/dL   Creatinine, Ser 0.46 (L) 0.61 - 1.24 mg/dL   Calcium 8.3 (L) 8.9 - 10.3 mg/dL   GFR calc non Af Amer >60 >60 mL/min   GFR calc Af Amer >60 >60 mL/min   Anion gap 10 5 - 15    Comment: Performed at [facility], [place] [address] [place], [place] [address]  Amylase     Status: None   Collection Time: 06/22/19  1:33 PM  Result Value Ref Range   Amylase 49 28 - 100 U/L    Comment: Performed at [facility] [address] [place], [place] [address]  CK     Status: Abnormal   Collection Time: 06/22/19  1:33 PM  Result Value Ref Range   Total CK 47 (L) 49 - 397 U/L    Comment: Performed at [facility], [place] [address], [place], [place] [address]  Lipase, blood     Status: None   Collection Time: 06/22/19  1:33 PM  Result Value Ref Range   Lipase 26 11 - 51 U/L    Comment: Performed at [facility], [place] [address], [place], [place] [address]  Uric acid     Status: None   Collection Time: 06/22/19  1:33 PM  Result Value Ref Range   Uric Acid, Serum 3.7 3.7 - 8.6 mg/dL    Comment: Performed at [facility] [address], [place], [place] [address]  Hemoglobin and hematocrit, blood     Status: Abnormal   Collection Time: 06/22/19  1:40 PM  Result Value Ref Range   Hemoglobin 7.9 (L) 13.0 - 17.0 g/dL   HCT 26.4 (L) 39.0 - 52.0 %    Comment: Performed at [facility] [address], [place],  [place] [address]  Glucose, capillary     Status: Abnormal   Collection Time: 06/22/19  3:05 PM  Result Value Ref Range   Glucose-Capillary 133 (H) 70 - 99 mg/dL  Glucose, capillary     Status: Abnormal   Collection Time: 06/22/19  7:31 PM  Result Value Ref Range   Glucose-Capillary 105 (H) 70 - 99 mg/dL  Glucose, capillary     Status: Abnormal   Collection Time: 06/22/19 11:08 PM  Result Value Ref Range   Glucose-Capillary 122 (H) 70 - 99 mg/dL  AM BMP     Status: Abnormal   Collection Time: 06/23/19  2:46 AM  Result Value Ref Range   Sodium 139 135 - 145 mmol/L   Potassium 4.0 3.5 - 5.1 mmol/L   Chloride 104 98 - 111 mmol/L   CO2 27 22 - 32 mmol/L   Glucose, Bld 115 (H) 70 - 99 mg/dL   BUN 7 6 - 20 mg/dL   Creatinine, Ser 0.68 0.61 - 1.24  mg/dL   Calcium 8.4 (L) 8.9 - 10.3 mg/dL   GFR calc non Af Amer >60 >60 mL/min   GFR calc Af Amer >60 >60 mL/min   Anion gap 8 5 - 15    Comment: Performed at [facility] [address], [place], [place] [address]  AM CBC     Status: Abnormal   Collection Time: 06/23/19  2:46 AM  Result Value Ref Range   WBC 9.8 4.0 - 10.5 K/uL   RBC 3.14 (L) 4.22 - 5.81 MIL/uL   Hemoglobin 7.5 (L) 13.0 - 17.0 g/dL   HCT 25.5 (L) 39.0 - 52.0 %   MCV 81.2 80.0 - 100.0 fL   MCH 23.9 (L) 26.0 - 34.0 pg   MCHC 29.4 (L) 30.0 - 36.0 g/dL   RDW 19.4 (H) 11.5 - 15.5 %   Platelets 512 (H) 150 - 400 K/uL   nRBC 0.0 0.0 - 0.2 %    Comment: Performed at [facility], [place] [address] [place], [place] [address]  Glucose, capillary     Status: Abnormal   Collection Time: 06/23/19  3:10 AM  Result Value Ref Range   Glucose-Capillary 105 (H) 70 - 99 mg/dL  Glucose, capillary     Status: Abnormal   Collection Time: 06/23/19  7:28 AM  Result Value Ref Range   Glucose-Capillary 132 (H) 70 - 99 mg/dL  Glucose, capillary     Status: Abnormal   Collection Time: 06/23/19 11:08 AM  Result Value Ref Range   Glucose-Capillary 125 (H) 70 - 99 mg/dL    MR ANKLE  RIGHT W WO CONTRAST  Result Date: 06/23/2019 CLINICAL DATA:  Soft tissue ankle swelling, infection EXAM: MRI OF THE RIGHT ANKLE WITHOUT AND WITH CONTRAST TECHNIQUE: Multiplanar, multisequence MR imaging of the ankle was performed before and after the administration of intravenous contrast. CONTRAST:  41mL GADAVIST GADOBUTROL 1 MMOL/ML IV SOLN COMPARISON:  None. FINDINGS: TENDONS Peroneal: Peroneal longus tendon intact. Peroneal brevis intact. Posteromedial: Diffusely increased intrasubstance signal and thickening with a tiny focal interstitial tear and surrounding fluid of the posterior tibialis tendon. A small amount of fluid is seen surrounding the flexor digitorum and flexor hallucis longus tendon. Anterior: Tibialis anterior tendon intact. Extensor hallucis longus tendon intact Extensor digitorum longus tendon intact. Achilles: Intact. However there is mildly increased signal seen within the Plantar Fascia: Increased intrasubstance signal seen within the middle cord of the plantar fascia. LIGAMENTS Lateral: Complete disruption of the anterior talofibular ligament is seen with fluid in the syndesmosis. There is increased intrasubstance signal in the posterior talofibular ligament, however it is intact. Calcaneofibular ligament intact. Posterior talofibular ligament intact. Anterior and posterior tibiofibular ligaments intact. Medial: Diffusely increased intrasubstance signal seen within the deltoid ligament, however there are intact fibers. Increased intrasubstance signal seen throughout the spring ligament. CARTILAGE Ankle Joint: There is a small enhancing joint effusion, however no definite synovial thickening is seen. Normal ankle mortise. No chondral defect. Subtalar Joints/Sinus Tarsi: Small amount of enhancing subtalar joint effusion is seen. There is edema within the sinus tarsi. Bones: No marrow signal abnormality. No fracture, marrow edema, or pathologic marrow infiltration. Soft Tissue: Overlying the  anteromedial aspect of the ankle at the navicular there is susceptibility artifact noted within the soft tissues. There is diffuse subcutaneous edema seen surrounding the ankle with skin thickening. No loculated fluid collection or sinus tract is seen. There is heel pad inflammation seen. IMPRESSION: IMPRESSION 1. No evidence of osteomyelitis, or soft  tissue abscess. 2. Mildly enhancing small ankle joint effusion which could be due to synovitis. 3. Focal interstitial tear with posterior tibialis tenosynovitis. 4. Mild flexor digitorum and flexor hallucis longus tenosynovitis. 5. Focal complete disruption of the anterior talofibular ligament with fluid in the syndesmosis. 6. Diffuse subcutaneous edema and cellulitis. 7. Small subtalar joint effusion and sinus tarsi edema. 8. Mild insertional Achilles tendinosis and middle cord plantar fasciitis. Electronically Signed   By: Prudencio Pair M.D.   On: 06/23/2019 01:34   DG CHEST PORT 1 VIEW  Result Date: 06/23/2019 CLINICAL DATA:  Intubation. EXAM: PORTABLE CHEST 1 VIEW COMPARISON:  CT 06/19/2019.  Chest x-ray 06/16/2019. FINDINGS: Tracheostomy tube and feeding tube in stable position. Stable cardiomegaly. Persistent prominent bilateral pulmonary infiltrates/edema again noted. Atelectatic changes both lung bases. Small bilateral pleural effusions again noted. No pneumothorax. Reference is made to chest CT report for discussion of rib fractures. IMPRESSION: 1.  Tracheostomy tube and feeding tube in stable position. 2.  Stable cardiomegaly. 3. Persistent prominent bilateral pulmonary infiltrates/edema again noted. Atelectatic changes both lung bases. Small bilateral pleural effusions again noted. 4. Reference is made to chest CT report of 06/19/2019 for discussion of rib fractures present. Electronically Signed   By: Marcello Moores  Register   On: 06/23/2019 06:44   DG Abd Portable 1V  Result Date: 06/22/2019 CLINICAL DATA:  Abdominal distension. EXAM: PORTABLE ABDOMEN - 1  VIEW COMPARISON:  None. FINDINGS: The bowel gas pattern is normal. Distal tip of feeding tube is seen in stomach. No radio-opaque calculi or other significant radiographic abnormality are seen. IMPRESSION: No evidence of bowel obstruction or ileus. Distal tip of feeding tube seen in stomach. Electronically Signed   By: Marijo Conception M.D.   On: 06/22/2019 12:23   [place] EKG SITE RITE  Result Date: 06/22/2019 If Site Rite image not attached, placement could not be confirmed due to current cardiac rhythm.   Review of Systems  Unable to perform ROS: Intubated   Blood pressure (!) 101/59, pulse 66, temperature 99.1 F (37.3 C), temperature source Axillary, resp. rate 18, height 5\' 8"  (1.727 m), weight 91.2 kg, SpO2 95 %. Physical Exam  Constitutional: He appears well-developed and well-nourished. No distress.  HENT:  Head: Normocephalic and atraumatic.  Eyes: Right eye exhibits no discharge. Left eye exhibits no discharge.  Cardiovascular: Normal rate and regular rhythm.  Respiratory: Effort normal. No respiratory distress.  Musculoskeletal:     Comments: RLE No traumatic wounds, ecchymosis, or rash  Ankle with moderate edema, medial erythema  No knee effusion  Knee stable to varus/ valgus and anterior/posterior stress  Sens DPN, SPN, TN could not assess  Motor EHL, ext, flex, evers could not assess  DP 0, PT 2+, 2+ NP edema  Neurological:  Intubated  Skin: Skin is warm and dry. He is not diaphoretic.    Assessment/Plan: Right ankle swelling/cellulitis -- Arthrocentesis performed, fluid did not look concerning. Will await lab results. Multiple medical problems including ARF, diffuse cellulitis, EtOH abuse, and HTN -- per primary service    Lisette Abu, PA-C Orthopedic Surgery [phone] 06/23/2019, 12:35 PM

## 2019-06-23 NOTE — Progress Notes (Signed)
Patient's daughter updated via phone. Discussed continued antibiotics and ventilator support. Discussed MRI results with daughter and recommendation for arthrocentesis for diagnostic purposes. Nature and purpose of procedure and risks/complications discussed. Patient's daughter provides consent over the phone for procedure. All questions and concerns addressed.   Harvie Heck, MD Internal Medicine, PGY-1 Pager # [phone] 06/23/19  11:17 AM

## 2019-06-23 NOTE — Procedures (Signed)
Procedure: Right ankle aspiration  Indication: Right ankle effusion(s)  Surgeon: Silvestre Gunner, PA-C  Assist: None  Anesthesia: None  EBL: None  Complications: None  Findings: After risks/benefits explained patient's POA desires to undergo procedure. Consent obtained and time out performed. The right ankle was sterilely prepped and aspirated via anterolateral approach. 21ml of clear fluid obtained. Pt tolerated the procedure well.    Lisette Abu, PA-C Orthopedic Surgery [phone]

## 2019-06-23 NOTE — Progress Notes (Signed)
Ankle aspirate appears to be gout, positive crystals, WBC only 3,675.  Recommend medical treatment, steroids, etc.    We can inject depomedrol as well if desired.   No surgery indicated. Will follow-up tomorrow.   Johnny Bridge, MD

## 2019-06-24 LAB — CBC
HCT: 25.1 % — ABNORMAL LOW (ref 39.0–52.0)
Hemoglobin: 7.2 g/dL — ABNORMAL LOW (ref 13.0–17.0)
MCH: 23.8 pg — ABNORMAL LOW (ref 26.0–34.0)
MCHC: 28.7 g/dL — ABNORMAL LOW (ref 30.0–36.0)
MCV: 83.1 fL (ref 80.0–100.0)
Platelets: 506 10*3/uL — ABNORMAL HIGH (ref 150–400)
RBC: 3.02 MIL/uL — ABNORMAL LOW (ref 4.22–5.81)
RDW: 19.4 % — ABNORMAL HIGH (ref 11.5–15.5)
WBC: 8.5 10*3/uL (ref 4.0–10.5)
nRBC: 0 % (ref 0.0–0.2)

## 2019-06-24 LAB — COMPREHENSIVE METABOLIC PANEL
ALT: 15 U/L (ref 0–44)
AST: 14 U/L — ABNORMAL LOW (ref 15–41)
Albumin: 1.4 g/dL — ABNORMAL LOW (ref 3.5–5.0)
Alkaline Phosphatase: 128 U/L — ABNORMAL HIGH (ref 38–126)
Anion gap: 9 (ref 5–15)
BUN: 10 mg/dL (ref 6–20)
CO2: 29 mmol/L (ref 22–32)
Calcium: 8.2 mg/dL — ABNORMAL LOW (ref 8.9–10.3)
Chloride: 102 mmol/L (ref 98–111)
Creatinine, Ser: 0.61 mg/dL (ref 0.61–1.24)
GFR calc Af Amer: >60 mL/min (ref >60)
GFR calc non Af Amer: >60 mL/min (ref >60)
Glucose, Bld: 113 mg/dL — ABNORMAL HIGH (ref 70–99)
Potassium: 4.1 mmol/L (ref 3.5–5.1)
Sodium: 140 mmol/L (ref 135–145)
Total Bilirubin: 0.7 mg/dL (ref 0.3–1.2)
Total Protein: 5.6 g/dL — ABNORMAL LOW (ref 6.5–8.1)

## 2019-06-24 LAB — GLUCOSE, CAPILLARY
Glucose-Capillary: 102 mg/dL — ABNORMAL HIGH (ref 70–99)
Glucose-Capillary: 104 mg/dL — ABNORMAL HIGH (ref 70–99)
Glucose-Capillary: 140 mg/dL — ABNORMAL HIGH (ref 70–99)
Glucose-Capillary: 152 mg/dL — ABNORMAL HIGH (ref 70–99)
Glucose-Capillary: 164 mg/dL — ABNORMAL HIGH (ref 70–99)
Glucose-Capillary: 168 mg/dL — ABNORMAL HIGH (ref 70–99)

## 2019-06-24 MED ORDER — SODIUM CHLORIDE 0.9 % IV SOLN
0.5000 mg/h | INTRAVENOUS | Status: DC
  Administered 2019-06-24: 13:00:00 1 mg/h via INTRAVENOUS
  Administered 2019-06-25 – 2019-06-26 (×3): 4 mg/h via INTRAVENOUS
  Filled 2019-06-24 (×7): qty 5

## 2019-06-24 MED ORDER — FUROSEMIDE 10 MG/ML IJ SOLN
40.0000 mg | Freq: Once | INTRAMUSCULAR | Status: AC
  Administered 2019-06-24: 13:00:00 40 mg via INTRAVENOUS
  Filled 2019-06-24: qty 4

## 2019-06-24 MED ORDER — HYDROMORPHONE HCL 1 MG/ML IJ SOLN
1.0000 mg | Freq: Once | INTRAMUSCULAR | Status: AC
  Administered 2019-06-24: 13:00:00 1 mg via INTRAVENOUS
  Filled 2019-06-24: qty 1

## 2019-06-24 MED ORDER — LACTULOSE 10 GM/15ML PO SOLN: 10.0000 g | Freq: Every day | ORAL | Status: DC | PRN

## 2019-06-24 MED ORDER — LACTULOSE ENEMA: 300.0000 mL | Freq: Once | ORAL | Status: DC

## 2019-06-24 NOTE — Progress Notes (Signed)
Daughter updated via phone. Discussed continued agitation when trying to wean off sedation. Also updated on synovial fluid analysis consistent with gout and appropriate treatment started. All questions and concerns addressed. Daughter will update patient's girlfriend.    Harvie Heck, MD Internal Medicine, PGY-1 Pager # [phone] 06/24/2019   3:07 PM

## 2019-06-24 NOTE — Progress Notes (Addendum)
PULMONARY / CRITICAL CARE MEDICINE   NAME:  William Surti., MRN:  [id number], DOB:  Dec 19, 1960, LOS: 12 ADMISSION DATE:  06/12/2019, CONSULTATION DATE:  06/12/2019 REFERRING MD:  Alinda Sierras, CHIEF COMPLAINT:  Shortness of breath, sore throat, and neck swelling  BRIEF HISTORY:    59 year old man with a history of alcohol use disorder, tobacco used disorder, HTN, and recent admission on 04/10/2019 for pancreatitis and hyponatremia who presents for evaluation of shortness of breath and neck swelling. Reported  to have started 5 days ago and progressively gotten worse. Patient tachycardic to 160's and tachypnic to the 40's in the ED. Seen in ED, patient says he has neck swelling , cough, and difficulty breathing. Admitted to ICU on 1/4 with loss of airway and PEA arrest requiring emergent tracheostomy.   SIGNIFICANT PAST MEDICAL HISTORY    Past Medical History:  Diagnosis Date  . Alcohol abuse   . Alcoholic ([place])   . Back pain   . CAP (community acquired pneumonia) 11/26/2018  . COPD (chronic obstructive pulmonary disease) ([place])   . Empyema of right pleural space ([place]) 11/26/2018  . Heavy smoker   . Hep C w/ coma, chronic ([place])   . Loose, teeth   . Pancreatitis   . Poor dentition   . Tobacco abuse    SIGNIFICANT EVENTS:  Admit to ICU on 1/4  1/4 : Lost airway and PEA arrest on, requiring emergent tracheostomy and ROSC obtained 1/5: 4/4 Blood cultures growing H.Influenza , narrowed to Unasyn 1/5 > 1/14: Continued on antibiotics without any significant improvement  1/14: New right foot swelling for which MRI obtained due to concerns for seeding of cellulitis from neck 1/15: MRI with synovitis and subcutaneous edema suggestive of cellulitis; orthopedics consulted for right ankle aspiration - aspirate with positive crystals suggestive of gout - patient started on NSAIDs and steroids  STUDIES:    CT Soft Tissue Neck W/ Contrast (1/4)  Left greater than right lower facial and infrahyoid  neck subcutaneous edema and inflammatory changes extending into the left anterior chest wall. There is no collection. Possible pharyngeal and supraglottic laryngeal wall edema, which could be accentuated by artifact. There is mild thickening of the epiglottis. Airway remains patent. No evidence of peritonsillar abscess.   CT Chest W Contrast (1/4) Patchy airspace consolidations within the lingula and dependent left lower lobe, with some volume loss within the right lung base. Findings are favored to represent multifocal pneumonia.  Induration within the soft tissues at the base of the neck extending into the superior aspect of the left chest wall with associated skin thickening, which may reflect cellulitis.   CT Head (1/4) Subtle loss of gray-white differentiation in the anterior right lentiform insular ribbon. This could represent an acute/subacute infarct. Moderate generalized atrophy and diffuse white matter disease. This likely reflects the sequela of chronic microvascular ischemia.  MRI Brain WO Contrast (1/5) No acute intracranial abnormality.  CT Soft Tissue Neck wo Contrast (1/11) Persistent but slightly improved left greater than right lower facial and infrahyoid neck subcutaneous edema and presumed inflammatory changes extending into the anterior chest wall. No evidence of developing collection  CT Chest wo Contrast (1/11) No subcutaneous or soft tissue emphysema in the chest wall to suggest necrotizing fasciitis in the chest. Stable mild skin thickening with associated subcutaneous fat stranding in the high ventral left chest wall. New small dependent bilateral pleural effusions. New mild interlobular septal thickening. New patchy ground-glass opacities in both lungs predominantly in the upper lobes.  New patchy centrilobular micronodularity in the right upper lobe. Acute anterior upper rib fractures bilaterally as detailed. No pneumothorax.  MR RIGHT ANKLE (1/14) No  evidence of osteomyelitis, or soft tissue abscess. Mildly enhancing small ankle joint effusion which could be due to synovitis.Focal interstitial tear with posterior tibialis tenosynovitis. Mild flexor digitorum and flexor hallucis longus tenosynovitis.Focal complete disruption of the anterior talofibular ligament with fluid in the syndesmosis.Diffuse subcutaneous edema and cellulitis.  CULTURES:  Influenza A : Negative Influenza B : Negtive Sars Coronavirus: Negative MRSA Nasopharyngeal negative 1/4 Blood cultures: 4/4 Positive for H. Influenza  Urine culture: No growth 1/6 Blood cultures: NGTD on 1/11 1/13 Tracheal aspirate: few klebsiella pneumoniae  1/16 R ankle aspirate : monosodium urate crystals, no organisms on gram stain; culture w/ no growth <24 hrs  ANTIBIOTICS:  Bactrim 1/4 Azithromycin 1/4  Vancomycin 1/4 Zosyn  1/4 one dose Clindamycin 1/4 one dose Unasyn 1/5 --> (tentative end date 07/01/2019)  LINES/TUBES:  Tracheostomy - placed 1/4 Peripheral IV in Right Forearm - placed 1/9 Peripheral IV in Left Forearm - placed 1/9 PICC line double lumen - placed 1/15  CONSULTANTS:  Infectious Disease  ENT  Orthopedic surgery  SUBJECTIVE:  O/N: afebrile, vitals stable. No acute overnight events noted by RN.   CONSTITUTIONAL: BP 106/62   Pulse (!) 58   Temp 98.6 F (37 C) (Oral)   Resp 18   Ht 5\' 8"  (1.727 m)   Wt 92.1 kg   SpO2 98%   BMI 30.87 kg/m   I/O last 3 completed shifts: In: 4592.5 [I.V.:1358.3; Blood:120; Other:60; NR:[id number]; IV Piggyback:577.8] Out: 3080 [Urine:3080]     Vent Mode: PRVC FiO2 (%):  [40 %] 40 % Set Rate:  [18 bmp] 18 bmp Vt Set:  [540 mL] 540 mL PEEP:  [5 cmH20] 5 cmH20 Plateau Pressure:  [25 cmH20-30 cmH20] 29 cmH20   PHYSICAL EXAM: General:  Laying in bed, trach tube in place, on vent, non diaph Neuro: Patient sedated, unable to arouse  HEENT:  Trach in place with continued neck swelling, no erythema or purulence noted  from trach site.  Cardiovascular:  RRR, no murmurs, rubs, or gallops Lungs:  Lungs CTAB Abdomen:  BS+, distended Musculoskeletal:  Right ankle appears significantly edematous and erythematous,slightly improved from prior  Skin: Edema and erythema of the left side of face,neck , chest extending to mid clavicular line and induration extending to left axilla, unchanged from prior exam   ASSESSMENT AND PLAN   Severe Soft Tissue Cellulitis  Acute Airway compromise s/p emergent percutaneous tracheostomy H. Influenza Bacteremia 4/4 bottles  Cellulitis of left neck extended to anterior chest , with blood cultures growing H. Flu on 1/4 requiring tracheostomy for airway protection. CT neck and chest 1/11 which showed persistent cellulitis but no signs of subcutaneous emphysema that would suggest necrotizing fascitis. ID was consulted and 3 weeks of antibiotics was recommended due to extent of cellulitis. ENT consulted - no concerns for necrotizing fascitis at this time. Physical exam with continued cellulitis of left neck and chest, unchanged from prior exam.  Plan: - Continue Unasyn for H. Influenza with plans for transition to PO Augmentin when able  (end date 07/01/2019) - Trend vitals, CBC - PRN Tylenol for fever - Continue trach collar trials as tolerated   R ankle gout:  Patient noted to have right ankle warmth, erythema and edema on examination. MRI ankle with diffuse subcutaneous edema and cellulitis with tenosynovitis. Orthopedics consulted for aspiration. Synovial fluid with monosodium urate  crystals and WBC 3675. Gram stain w/o any organisms.  - Naproxen 500mg  bid and Prednisone 40mg  qd w/taper  CAP Multifocal pneumonia on CT Chest. Negative RVP. History of Streptococcus Intermedius empyema in 11/2018 s/p VATS CXR with persistent prominent bilateral pulmonary infiltrates/edema Plan: - Continue Unasyn  Agitation Alcohol Use Disorder Intubated and difficult to assess withdrawal , unsure of  last drink. Patient becomes agitated when weaned off sedatives. Seroquel increased yesterday. QTc 439. Patient remains on fentanyl and precedex.  Plan: - Switching fentanyl to dilaudid today - Continue Clonazepam, clonidine and seroquel  - Wean sedation as tolerated   Abdominal distension:  Patient noted to have significant abdominal distension for which inflammatory labs checked. No elevation in CK, lactic acid, amylase or lipase. KUB w/o evidence of ileus or significant stool burden. Remains distended today. Last BM 1/13 despite daily Miralax - Lactulose enema today - Scheduled bowel regimen   Scrotal edema: Secondary to volume overload Plan: Lasix 40mg  IV once   Normocytic Anemia Hb 7.2 this AM, no obvious signs of bleeding; likely in setting of critical illness  P:  - trend Hb - Transfuse for Hb <7  Best Practice / Goals of Care / Disposition.   DVT PROPHYLAXIS: Subq Heparin SUP: Pepcid bid NUTRITION: tube feeds MOBILITY: bed bound  Code Status: Full  FAMILY DISCUSSIONS: Will update daughter and significant other  DISPOSITION : continues to require ICU level care   LABS  Glucose Recent Labs  Lab 06/23/19 0728 06/23/19 1108 06/23/19 1532 06/23/19 1928 06/23/19 2329 06/24/19 0329  GLUCAP 132* 125* 113* 126* 107* 104*    BMET Recent Labs  Lab 06/22/19 1333 06/23/19 0246 06/24/19 0329  NA 138 139 140  K 4.2 4.0 4.1  CL 101 104 102  CO2 27 27 29   BUN 9 7 10   CREATININE 0.46* 0.68 0.61  GLUCOSE 125* 115* 113*    Liver Enzymes Recent Labs  Lab 06/22/19 0404 06/24/19 0329  AST 20 14*  ALT 20 15  ALKPHOS 145* 128*  BILITOT 0.4 0.7  ALBUMIN 1.4* 1.4*    Electrolytes Recent Labs  Lab 06/19/19 0710 06/19/19 0710 06/20/19 0701 06/21/19 0543 06/22/19 0404 06/22/19 0404 06/22/19 1333 06/23/19 0246 06/24/19 0329  CALCIUM 8.2*   < > 8.3*   < > 8.3*   < > 8.3* 8.4* 8.2*  MG 2.0  --  2.0  --  2.0  --   --   --   --   PHOS 4.5  --  5.6*  --   --    --   --   --   --    < > = values in this interval not displayed.    CBC Recent Labs  Lab 06/22/19 0404 06/22/19 0404 06/22/19 1340 06/23/19 0246 06/24/19 0329  WBC 10.7*  --   --  9.8 8.5  HGB 6.9*   < > 7.9* 7.5* 7.2*  HCT 24.4*   < > 26.4* 25.5* 25.1*  PLT 473*  --   --  512* 506*   < > = values in this interval not displayed.    ABG No results for input(s): PHART, PCO2ART, PO2ART in the last 168 hours.  Coag's No results for input(s): APTT, INR in the last 168 hours.  Sepsis Markers Recent Labs  Lab 06/19/19 1513 06/22/19 1333  LATICACIDVEN 1.1 0.8    Cardiac Enzymes No results for input(s): TROPONINI, PROBNP in the last 168 hours.   CRITICAL CARE  Harvie Heck, MD  Internal Medicine, PGY-1  Pager # [phone] 06/24/2019 6:37 AM  Please see Attending A/P and/or Addendum for final recommendations.

## 2019-06-24 NOTE — Progress Notes (Signed)
    Subjective: Patient intubated and sedated.  Nurse at bedside.  Objective:   VITALS:   Vitals:   06/24/19 1128 06/24/19 1149 06/24/19 1200 06/24/19 1300  BP: 97/60 97/60 125/72 107/71  Pulse:  83 91 62  Resp:  19 20 18   Temp:      TempSrc:      SpO2:   97% 98%  Weight:      Height:       CBC Latest Ref Rng & Units 06/24/2019 06/23/2019 06/22/2019  WBC 4.0 - 10.5 K/uL 8.5 9.8 -  Hemoglobin 13.0 - 17.0 g/dL 7.2(L) 7.5(L) 7.9(L)  Hematocrit 39.0 - 52.0 % 25.1(L) 25.5(L) 26.4(L)  Platelets 150 - 400 K/uL 506(H) 512(H) -   BMP Latest Ref Rng & Units 06/24/2019 06/23/2019 06/22/2019  Glucose 70 - 99 mg/dL 113(H) 115(H) 125(H)  BUN 6 - 20 mg/dL 10 7 9   Creatinine 0.61 - 1.24 mg/dL 0.61 0.68 0.46(L)  Sodium 135 - 145 mmol/L 140 139 138  Potassium 3.5 - 5.1 mmol/L 4.1 4.0 4.2  Chloride 98 - 111 mmol/L 102 104 101  CO2 22 - 32 mmol/L 29 27 27   Calcium 8.9 - 10.3 mg/dL 8.2(L) 8.4(L) 8.3(L)   Intake/Output      01/15 0701 - 01/16 0700 01/16 0701 - 01/17 0700   I.V. (mL/kg) 1192.8 (13) 242.2 (2.6)   Blood     Other 60    NG/GT 1670 650   IV Piggyback 400.2 100   Total Intake(mL/kg) 3323 (36.1) 992.2 (10.8)   Urine (mL/kg/hr) 1240 (0.6) 600 (0.9)   Total Output 1240 600   Net +2083 +392.2           Physical Exam: General: NAD.  Intubated, sedated  MSK All extremities edematous, warm.   Remote knee and ankle surgical scars benign. RLE areas previously marked by pen as erythematous now with minimal if any redness. No grimace or pain reaction with passive motion right ankle or toes.   Assessment / Plan:  Principal Problem:   Neck infection Active Problems:   Alcohol abuse   Poor dentition   Septic shock (HCC)   Laryngeal edema   Multifocal pneumonia   Airway compromise   Chronic hepatitis C without hepatic coma (HCC)   Acute respiratory failure with hypoxemia (HCC)   Cardiac arrest, cause unspecified (HCC)   Haemophilis influenzae bacteremia    Malnutrition of  moderate degree   Right ankle erythema Arthrocentesis aspirate appears to be gout.  Positive crystals.  Only 3675 WBCs.  Cx NGTD.  Could consider articular Depo-Medrol injection.  Will hold off - He appears to be improving since initiation of NSAID and steroid.  Another intra-articular procedure increases risk of infection.  No surgery indicated.  Please call with questions or for injection.   Charna Elizabeth Martensen III, PA-C 06/24/2019, 2:07 PM

## 2019-06-25 ENCOUNTER — Inpatient Hospital Stay (HOSPITAL_COMMUNITY): Payer: Medicaid Other

## 2019-06-25 LAB — COMPREHENSIVE METABOLIC PANEL
ALT: 15 U/L (ref 0–44)
AST: 16 U/L (ref 15–41)
Albumin: 1.6 g/dL — ABNORMAL LOW (ref 3.5–5.0)
Alkaline Phosphatase: 138 U/L — ABNORMAL HIGH (ref 38–126)
Anion gap: 9 (ref 5–15)
BUN: 16 mg/dL (ref 6–20)
CO2: 30 mmol/L (ref 22–32)
Calcium: 8.4 mg/dL — ABNORMAL LOW (ref 8.9–10.3)
Chloride: 101 mmol/L (ref 98–111)
Creatinine, Ser: 0.66 mg/dL (ref 0.61–1.24)
GFR calc Af Amer: >60 mL/min (ref >60)
GFR calc non Af Amer: >60 mL/min (ref >60)
Glucose, Bld: 122 mg/dL — ABNORMAL HIGH (ref 70–99)
Potassium: 3.9 mmol/L (ref 3.5–5.1)
Sodium: 140 mmol/L (ref 135–145)
Total Bilirubin: 0.5 mg/dL (ref 0.3–1.2)
Total Protein: 6 g/dL — ABNORMAL LOW (ref 6.5–8.1)

## 2019-06-25 LAB — GLUCOSE, CAPILLARY
Glucose-Capillary: 120 mg/dL — ABNORMAL HIGH (ref 70–99)
Glucose-Capillary: 123 mg/dL — ABNORMAL HIGH (ref 70–99)
Glucose-Capillary: 127 mg/dL — ABNORMAL HIGH (ref 70–99)
Glucose-Capillary: 154 mg/dL — ABNORMAL HIGH (ref 70–99)
Glucose-Capillary: 179 mg/dL — ABNORMAL HIGH (ref 70–99)
Glucose-Capillary: 182 mg/dL — ABNORMAL HIGH (ref 70–99)

## 2019-06-25 LAB — GONOCOCCUS CULTURE

## 2019-06-25 LAB — CBC
HCT: 26.2 % — ABNORMAL LOW (ref 39.0–52.0)
Hemoglobin: 7.5 g/dL — ABNORMAL LOW (ref 13.0–17.0)
MCH: 23.7 pg — ABNORMAL LOW (ref 26.0–34.0)
MCHC: 28.6 g/dL — ABNORMAL LOW (ref 30.0–36.0)
MCV: 82.9 fL (ref 80.0–100.0)
Platelets: 637 10*3/uL — ABNORMAL HIGH (ref 150–400)
RBC: 3.16 MIL/uL — ABNORMAL LOW (ref 4.22–5.81)
RDW: 19.2 % — ABNORMAL HIGH (ref 11.5–15.5)
WBC: 11.3 10*3/uL — ABNORMAL HIGH (ref 4.0–10.5)
nRBC: 0 % (ref 0.0–0.2)

## 2019-06-25 LAB — PHOSPHORUS: Phosphorus: 5.2 mg/dL — ABNORMAL HIGH (ref 2.5–4.6)

## 2019-06-25 LAB — MAGNESIUM: Magnesium: 2.3 mg/dL (ref 1.7–2.4)

## 2019-06-25 MED ORDER — OXYCODONE HCL 5 MG/5ML PO SOLN
5.0000 mg | Freq: Four times a day (QID) | ORAL | Status: DC
  Administered 2019-06-25 – 2019-06-26 (×4): 5 mg via ORAL
  Filled 2019-06-25 (×4): qty 5

## 2019-06-25 MED ORDER — HYDROMORPHONE BOLUS VIA INFUSION
0.5000 mg | INTRAVENOUS | Status: DC | PRN
  Administered 2019-06-25 – 2019-06-27 (×4): 0.5 mg via INTRAVENOUS
  Filled 2019-06-25: qty 1

## 2019-06-25 MED ORDER — MIDAZOLAM HCL 2 MG/2ML IJ SOLN: 1.0000 mg | INTRAMUSCULAR | Status: DC | PRN

## 2019-06-25 MED ORDER — MIDAZOLAM HCL 2 MG/2ML IJ SOLN
1.0000 mg | INTRAMUSCULAR | Status: DC | PRN
  Administered 2019-06-25: 17:00:00 2 mg via INTRAVENOUS
  Administered 2019-06-27: 04:00:00 1 mg via INTRAVENOUS
  Filled 2019-06-25 (×2): qty 2

## 2019-06-25 MED ORDER — POTASSIUM CHLORIDE 20 MEQ/15ML (10%) PO SOLN
10.0000 meq | Freq: Every day | ORAL | Status: DC
  Administered 2019-06-25 – 2019-06-27 (×3): 10 meq via ORAL
  Filled 2019-06-25 (×3): qty 15

## 2019-06-25 MED ORDER — MIDAZOLAM HCL 2 MG/2ML IJ SOLN
2.0000 mg | Freq: Once | INTRAMUSCULAR | Status: AC
  Administered 2019-06-25: 02:00:00 2 mg via INTRAVENOUS
  Filled 2019-06-25: qty 2

## 2019-06-25 MED ORDER — COLCHICINE 0.6 MG PO TABS
0.6000 mg | ORAL_TABLET | Freq: Every day | ORAL | Status: DC
  Administered 2019-06-25 – 2019-06-27 (×3): 0.6 mg via ORAL
  Filled 2019-06-25 (×3): qty 1

## 2019-06-25 NOTE — Progress Notes (Signed)
Reached out to Ms. Reola Calkins, patient's daughter, to update her about her father's condition. I left a [employer] compliant message after being sent to VM.  Maudie Mercury, MD IMTS, PGY-1

## 2019-06-25 NOTE — Progress Notes (Signed)
[place] Progress Note Patient Name: [name]. DOB: January 10, 1961 MRN: [id number]   Date of Service  06/25/2019  HPI/Events of Note  Marked agitation despite Precedex infusion  eICU Interventions  PRN Versed order entered.        [name] 06/25/2019, 5:10 AM

## 2019-06-25 NOTE — Progress Notes (Signed)
[facility] Progress Note Patient Name: [name]. DOB: 06-May-1961 MRN: [id number]   Date of Service  06/25/2019  HPI/Events of Note  Sub-optimal sedation on the ventilator  eICU Interventions  Versed 2 mg iv x 1        Parnika Tweten U Shatia Sindoni 06/25/2019, 1:46 AM

## 2019-06-25 NOTE — Progress Notes (Signed)
Pt restless/agitated, pt dangling legs over side of bed and shifting body towards foot of bed, unable to console or redirect pt, pt HR 130s-150s. Dilaudid drip titrated to max of 4 mg/hr and precedex resumed to 1.2 mcg/hr.

## 2019-06-25 NOTE — Progress Notes (Addendum)
[name]  PULMONARY / CRITICAL CARE MEDICINE   NAME:  Ramal Mckethan., MRN:  [id number], DOB:  [birth year], LOS: 63 ADMISSION DATE:  06/12/2019, CONSULTATION DATE:  06/12/2019 REFERRING MD:  Alinda Sierras, CHIEF COMPLAINT:  Shortness of breath, sore throat, and neck swelling  BRIEF HISTORY:    59 year old man with a history of alcohol use disorder, tobacco used disorder, HTN, and recent admission on 04/10/2019 for pancreatitis and hyponatremia who presents for evaluation of shortness of breath and neck swelling. Reported  to have started 5 days ago and progressively gotten worse. Patient tachycardic to 160's and tachypnic to the 40's in the ED. Seen in ED, patient says he has neck swelling , cough, and difficulty breathing. Admitted to ICU on 1/4 with loss of airway and PEA arrest requiring emergent tracheostomy.   SIGNIFICANT PAST MEDICAL HISTORY    Past Medical History:  Diagnosis Date  . Alcohol abuse   . Alcoholic ([place])   . Back pain   . CAP (community acquired pneumonia) 11/26/2018  . COPD (chronic obstructive pulmonary disease) ([place])   . Empyema of right pleural space ([place]) 11/26/2018  . Heavy smoker   . Hep C w/ coma, chronic ([place])   . Loose, teeth   . Pancreatitis   . Poor dentition   . Tobacco abuse    SIGNIFICANT EVENTS:  Admit to ICU on 1/4  1/4 : Lost airway and PEA arrest on, requiring emergent tracheostomy and ROSC obtained 1/5: 4/4 Blood cultures growing H.Influenza , narrowed to Unasyn 1/5 > 1/14: Continued on antibiotics without any significant improvement  1/14: New right foot swelling for which MRI obtained due to concerns for seeding of cellulitis from neck 1/15: MRI with synovitis and subcutaneous edema suggestive of cellulitis; orthopedics consulted for right ankle aspiration - aspirate with positive crystals suggestive of gout - patient started on NSAIDs and steroids 1/16 Started on dilaudid, D/C fentanyl  1/17: Patient agitated early AM, Q2H PRN Versed prescribed.    STUDIES:    CT Soft Tissue Neck W/ Contrast (1/4)  Left greater than right lower facial and infrahyoid neck subcutaneous edema and inflammatory changes extending into the left anterior chest wall. There is no collection. Possible pharyngeal and supraglottic laryngeal wall edema, which could be accentuated by artifact. There is mild thickening of the epiglottis. Airway remains patent. No evidence of peritonsillar abscess.   CT Chest W Contrast (1/4) Patchy airspace consolidations within the lingula and dependent left lower lobe, with some volume loss within the right lung base. Findings are favored to represent multifocal pneumonia.  Induration within the soft tissues at the base of the neck extending into the superior aspect of the left chest wall with associated skin thickening, which may reflect cellulitis.   CT Head (1/4) Subtle loss of gray-white differentiation in the anterior right lentiform insular ribbon. This could represent an acute/subacute infarct. Moderate generalized atrophy and diffuse white matter disease. This likely reflects the sequela of chronic microvascular ischemia.  MRI Brain WO Contrast (1/5) No acute intracranial abnormality.  CT Soft Tissue Neck wo Contrast (1/11) Persistent but slightly improved left greater than right lower facial and infrahyoid neck subcutaneous edema and presumed inflammatory changes extending into the anterior chest wall. No evidence of developing collection  CT Chest wo Contrast (1/11) No subcutaneous or soft tissue emphysema in the chest wall to suggest necrotizing fasciitis in the chest. Stable mild skin thickening with associated subcutaneous fat stranding in the high ventral left chest wall. New small dependent bilateral  pleural effusions. New mild interlobular septal thickening. New patchy ground-glass opacities in both lungs predominantly in the upper lobes. New patchy centrilobular micronodularity in the right upper  lobe. Acute anterior upper rib fractures bilaterally as detailed. No pneumothorax.  MR RIGHT ANKLE (1/14) No evidence of osteomyelitis, or soft tissue abscess. Mildly enhancing small ankle joint effusion which could be due to synovitis.Focal interstitial tear with posterior tibialis tenosynovitis. Mild flexor digitorum and flexor hallucis longus tenosynovitis.Focal complete disruption of the anterior talofibular ligament with fluid in the syndesmosis.Diffuse subcutaneous edema and cellulitis.  CULTURES:  Influenza A : Negative Influenza B : Negtive Sars Coronavirus: Negative MRSA Nasopharyngeal negative 1/4 Blood cultures: 4/4 Positive for H. Influenza  Urine culture: No growth 1/6 Blood cultures: NGTD on 1/11 1/13 Tracheal aspirate: few klebsiella pneumoniae  1/16 R ankle aspirate : monosodium urate crystals, no organisms on gram stain; culture w/ no growth <24 hrs  ANTIBIOTICS:  Bactrim 1/4 Azithromycin 1/4  Vancomycin 1/4 Zosyn  1/4 one dose Clindamycin 1/4 one dose Unasyn 1/5 --> (tentative end date 07/01/2019)  LINES/TUBES:  Tracheostomy - placed 1/4 Peripheral IV in Right Forearm - placed 1/9 Peripheral IV in Left Forearm - placed 1/9 PICC line double lumen - placed 1/15  CONSULTANTS:  Infectious Disease  ENT  Orthopedic surgery  SUBJECTIVE:  O/N: afebrile, vitals stable, agitated despite precedex. Versed PRN ordered.   CONSTITUTIONAL: BP 104/61   Pulse 69   Temp 99.1 F (37.3 C) (Oral)   Resp 18   Ht 5\' 8"  (1.727 m)   Wt 92.1 kg   SpO2 98%   BMI 30.87 kg/m   I/O last 3 completed shifts: In: 5034.2 [I.V.:1609; Other:60; [id number]; IV Piggyback:600.2] Out: 3840 [Urine:3840]     Vent Mode: PRVC FiO2 (%):  [40 %] 40 % Set Rate:  [18 bmp] 18 bmp Vt Set:  [540 mL] 540 mL PEEP:  [5 cmH20] 5 cmH20 Plateau Pressure:  [21 cmH20-26 cmH20] 26 cmH20   PHYSICAL EXAM: General:  Laying in bed, no acute distress, on vent, non diaphoretic  Neuro: Patient  sedated, unable to arouse, gag reflex intact  HEENT:  Trach in place with continued neck swelling, no erythema or purulence noted from trach site.  Cardiovascular:  RRR, no murmurs, rubs, or gallops Lungs:  Lungs CTAB Abdomen:  BS+, distended Musculoskeletal:  Right ankle edematous and erythematous, Skin: Edema and erythema of the left side of face, neck , chest extending to mid clavicular line and induration extending to left axilla, unchanged.  ASSESSMENT AND PLAN   Severe Soft Tissue Cellulitis  Acute Airway compromise s/p emergent percutaneous tracheostomy H. Influenza Bacteremia 4/4 bottles  Cellulitis of left neck extended to anterior chest , with blood cultures growing H. Flu on 1/4 requiring tracheostomy for airway protection. CT neck and chest 1/11 which showed persistent cellulitis but no signs of subcutaneous emphysema that would suggest necrotizing fascitis. ID was consulted and 3 weeks of antibiotics was recommended due to extent of cellulitis. ENT consulted - no concerns for necrotizing fascitis at this time. Physical exam with continued cellulitis of left neck and chest, unchanged from prior exam.  Plan: - Continue Unasyn for H. Influenza with plans for transition to PO Augmentin when able (end date 07/01/2019) - WBC: 11.3 - Trend vitals, CBC - PRN Tylenol for fever - Continue trach collar trials as tolerated.   R Ankle Gout:  Patient noted to have right ankle warmth, erythema and edema on examination. MRI ankle with diffuse subcutaneous edema and  cellulitis with tenosynovitis. Synovial fluid shows monosodium urate crystals and WBC 3675. Gram stain w/o any organisms.  - Naproxen 500mg  bid and Prednisone 40mg  qd w/taper  CAP: Multifocal pneumonia on CT Chest. Negative RVP. History of Streptococcus Intermedius empyema in 11/2018 s/p VATS CXR with persistent prominent bilateral pulmonary infiltrates/edema Plan: - Continue Unasyn  Agitation Alcohol Use Disorder Intubated and  difficult to assess withdrawal , unsure of last drink. Patient becomes agitated when weaned off sedatives. Seroquel was increased. QTc 439. Patient remains on precedex.  Plan: - Continue Dilaudid  - Continue Clonazepam, clonidine and seroquel  - Wean sedation as tolerated   Abdominal Distension:  Patient noted to have significant abdominal distension for which inflammatory labs checked. No elevation in CK, lactic acid, amylase or lipase. KUB w/o evidence of ileus or significant stool burden. Remains distended today. Last BM 1/13 despite daily Miralax - Scheduled bowel regimen   Scrotal Edema: Secondary to volume overload. Plan: Lasix 40mg  IV once.  Normocytic Anemia Hb 7.5 this AM, no obvious signs of bleeding; likely in setting of critical illness  P:  - Trend Hb - Transfuse for Hb <7  Best Practice / Goals of Care / Disposition.   DVT PROPHYLAXIS: Subq Heparin SUP: Pepcid bid NUTRITION: tube feeds MOBILITY: bed bound  Code Status: Full  FAMILY DISCUSSIONS: Will update daughter and significant other  DISPOSITION : continues to require ICU level care   LABS  Glucose Recent Labs  Lab 06/24/19 0740 06/24/19 1107 06/24/19 1557 06/24/19 1952 06/24/19 2355 06/25/19 0405  GLUCAP 102* 140* 164* 152* 168* 123*    BMET Recent Labs  Lab 06/23/19 0246 06/24/19 0329 06/25/19 0440  NA 139 140 140  K 4.0 4.1 3.9  CL 104 102 101  CO2 27 29 30   BUN 7 10 16   CREATININE 0.68 0.61 0.66  GLUCOSE 115* 113* 122*    Liver Enzymes Recent Labs  Lab 06/22/19 0404 06/24/19 0329 06/25/19 0440  AST 20 14* 16  ALT 20 15 15   ALKPHOS 145* 128* 138*  BILITOT 0.4 0.7 0.5  ALBUMIN 1.4* 1.4* 1.6*    Electrolytes Recent Labs  Lab 06/19/19 0710 06/19/19 0710 06/20/19 0701 06/21/19 0543 06/22/19 0404 06/22/19 1333 06/23/19 0246 06/24/19 0329 06/25/19 0440  CALCIUM 8.2*   < > 8.3*   < > 8.3*   < > 8.4* 8.2* 8.4*  MG 2.0   < > 2.0  --  2.0  --   --   --  2.3  PHOS 4.5  --   5.6*  --   --   --   --   --  5.2*   < > = values in this interval not displayed.    CBC Recent Labs  Lab 06/23/19 0246 06/24/19 0329 06/25/19 0440  WBC 9.8 8.5 11.3*  HGB 7.5* 7.2* 7.5*  HCT 25.5* 25.1* 26.2*  PLT 512* 506* 637*    ABG No results for input(s): PHART, PCO2ART, PO2ART in the last 168 hours.  Coag's No results for input(s): APTT, INR in the last 168 hours.  Sepsis Markers Recent Labs  Lab 06/19/19 1513 06/22/19 1333  LATICACIDVEN 1.1 0.8    Cardiac Enzymes No results for input(s): TROPONINI, PROBNP in the last 168 hours.   CRITICAL CARE  Maudie Mercury, MD IMTS, PGY-1 Pager: [phone] Please see Attending A/P and/or Addendum for final recommendations.

## 2019-06-25 NOTE — Progress Notes (Signed)
elink nurse [place] notified of pt continued restlessness/agitation and hr 130s-150s, camera visual done by Elzie Rings. Elzie Rings will inform md

## 2019-06-26 DIAGNOSIS — R410 Disorientation, unspecified: Secondary | ICD-10-CM

## 2019-06-26 DIAGNOSIS — B182 Chronic viral hepatitis C: Secondary | ICD-10-CM

## 2019-06-26 DIAGNOSIS — Z430 Encounter for attention to tracheostomy: Secondary | ICD-10-CM

## 2019-06-26 LAB — CBC
HCT: 18.4 % — ABNORMAL LOW (ref 39.0–52.0)
HCT: 32.5 % — ABNORMAL LOW (ref 39.0–52.0)
Hemoglobin: 5.2 g/dL — CL (ref 13.0–17.0)
Hemoglobin: 9.6 g/dL — ABNORMAL LOW (ref 13.0–17.0)
MCH: 24.1 pg — ABNORMAL LOW (ref 26.0–34.0)
MCH: 25.3 pg — ABNORMAL LOW (ref 26.0–34.0)
MCHC: 28.3 g/dL — ABNORMAL LOW (ref 30.0–36.0)
MCHC: 29.5 g/dL — ABNORMAL LOW (ref 30.0–36.0)
MCV: 85.2 fL (ref 80.0–100.0)
MCV: 85.5 fL (ref 80.0–100.0)
Platelets: 698 10*3/uL — ABNORMAL HIGH (ref 150–400)
Platelets: 594 10*3/uL — ABNORMAL HIGH (ref 150–400)
RBC: 2.16 MIL/uL — ABNORMAL LOW (ref 4.22–5.81)
RBC: 3.8 MIL/uL — ABNORMAL LOW (ref 4.22–5.81)
RDW: 19.1 % — ABNORMAL HIGH (ref 11.5–15.5)
RDW: 17.9 % — ABNORMAL HIGH (ref 11.5–15.5)
WBC: 10.7 10*3/uL — ABNORMAL HIGH (ref 4.0–10.5)
WBC: 10.8 10*3/uL — ABNORMAL HIGH (ref 4.0–10.5)
nRBC: 0 % (ref 0.0–0.2)
nRBC: 0 % (ref 0.0–0.2)

## 2019-06-26 LAB — COMPREHENSIVE METABOLIC PANEL
ALT: 14 U/L (ref 0–44)
AST: 12 U/L — ABNORMAL LOW (ref 15–41)
Albumin: 1.7 g/dL — ABNORMAL LOW (ref 3.5–5.0)
Alkaline Phosphatase: 129 U/L — ABNORMAL HIGH (ref 38–126)
Anion gap: 8 (ref 5–15)
BUN: 14 mg/dL (ref 6–20)
CO2: 29 mmol/L (ref 22–32)
Calcium: 8.4 mg/dL — ABNORMAL LOW (ref 8.9–10.3)
Chloride: 104 mmol/L (ref 98–111)
Creatinine, Ser: 0.65 mg/dL (ref 0.61–1.24)
GFR calc Af Amer: >60 mL/min (ref >60)
GFR calc non Af Amer: >60 mL/min (ref >60)
Glucose, Bld: 150 mg/dL — ABNORMAL HIGH (ref 70–99)
Potassium: 3.9 mmol/L (ref 3.5–5.1)
Sodium: 141 mmol/L (ref 135–145)
Total Bilirubin: 0.5 mg/dL (ref 0.3–1.2)
Total Protein: 6.2 g/dL — ABNORMAL LOW (ref 6.5–8.1)

## 2019-06-26 LAB — PREPARE RBC (CROSSMATCH)

## 2019-06-26 LAB — GLUCOSE, CAPILLARY
Glucose-Capillary: 148 mg/dL — ABNORMAL HIGH (ref 70–99)
Glucose-Capillary: 125 mg/dL — ABNORMAL HIGH (ref 70–99)
Glucose-Capillary: 136 mg/dL — ABNORMAL HIGH (ref 70–99)
Glucose-Capillary: 138 mg/dL — ABNORMAL HIGH (ref 70–99)
Glucose-Capillary: 127 mg/dL — ABNORMAL HIGH (ref 70–99)
Glucose-Capillary: 119 mg/dL — ABNORMAL HIGH (ref 70–99)

## 2019-06-26 LAB — BODY FLUID CULTURE: Culture: NO GROWTH

## 2019-06-26 MED ORDER — GABAPENTIN 250 MG/5ML PO SOLN
900.0000 mg | Freq: Three times a day (TID) | ORAL | Status: DC
  Administered 2019-06-26 – 2019-07-08 (×32): 900 mg
  Filled 2019-06-26 (×36): qty 18

## 2019-06-26 MED ORDER — CLONAZEPAM 0.5 MG PO TBDP
1.0000 mg | ORAL_TABLET | Freq: Three times a day (TID) | ORAL | Status: DC
  Administered 2019-06-26 – 2019-07-01 (×15): 1 mg
  Filled 2019-06-26 (×15): qty 2

## 2019-06-26 MED ORDER — SODIUM CHLORIDE 0.9% IV SOLUTION: Freq: Once | INTRAVENOUS | Status: DC

## 2019-06-26 MED ORDER — METHADONE HCL 10 MG PO TABS
5.0000 mg | ORAL_TABLET | Freq: Three times a day (TID) | ORAL | Status: DC
  Administered 2019-06-26 – 2019-06-29 (×12): 5 mg via ORAL
  Filled 2019-06-26 (×12): qty 1

## 2019-06-26 MED ORDER — SENNA 8.6 MG PO TABS: 1.0000 | ORAL_TABLET | Freq: Every day | ORAL | Status: DC

## 2019-06-26 NOTE — Progress Notes (Signed)
CRITICAL VALUE ALERT  Critical Value:  Hgb 5.2  Date & Time Notied:  06/26/19 6:10 AM  Provider Notified: elink

## 2019-06-26 NOTE — Progress Notes (Addendum)
PULMONARY / CRITICAL CARE MEDICINE  NAME:  William Chappel., MRN:  [id number], DOB:  06-12-1960, LOS: 55 ADMISSION DATE:  06/12/2019, CONSULTATION DATE:  06/12/2019 REFERRING MD:  Alinda Sierras, CHIEF COMPLAINT:  Shortness of breath, sore throat, and neck swelling  BRIEF HISTORY:    59 year old man with a history of alcohol use disorder, tobacco used disorder, HTN, and recent admission on 04/10/2019 for pancreatitis and hyponatremia who presents for evaluation of shortness of breath and neck swelling. Reported  to have started 5 days ago and progressively gotten worse. Patient tachycardic to 160's and tachypnic to the 40's in the ED. Seen in ED, patient says he has neck swelling , cough, and difficulty breathing. Admitted to ICU on 1/4 with loss of airway and PEA arrest requiring emergent tracheostomy.   SIGNIFICANT PAST MEDICAL HISTORY    Past Medical History:  Diagnosis Date  . Alcohol abuse   . Alcoholic ([place])   . Back pain   . CAP (community acquired pneumonia) 11/26/2018  . COPD (chronic obstructive pulmonary disease) ([place])   . Empyema of right pleural space ([place]) 11/26/2018  . Heavy smoker   . Hep C w/ coma, chronic ([place])   . Loose, teeth   . Pancreatitis   . Poor dentition   . Tobacco abuse    SIGNIFICANT EVENTS:  Admit to ICU on 1/4  1/4 : Lost airway and PEA arrest on, requiring emergent tracheostomy and ROSC obtained 1/5: 4/4 Blood cultures growing H.Influenza , narrowed to Unasyn 1/5 > 1/14: Continued on antibiotics without any significant improvement  1/14: New right foot swelling for which MRI obtained due to concerns for seeding of cellulitis from neck 1/15: MRI with synovitis and subcutaneous edema suggestive of cellulitis; orthopedics consulted for right ankle aspiration - aspirate with positive crystals suggestive of gout - patient started on NSAIDs and steroids 1/16 Started on dilaudid, D/C fentanyl  1/17: Patient agitated early AM, q2h prn versed prescribed  STUDIES:     CT Soft Tissue Neck W/ Contrast (1/4)  Left greater than right lower facial and infrahyoid neck subcutaneous edema and inflammatory changes extending into the left anterior chest wall. There is no collection. Possible pharyngeal and supraglottic laryngeal wall edema, which could be accentuated by artifact. There is mild thickening of the epiglottis. Airway remains patent. No evidence of peritonsillar abscess.   CT Chest W Contrast (1/4) Patchy airspace consolidations within the lingula and dependent left lower lobe, with some volume loss within the right lung base. Findings are favored to represent multifocal pneumonia.  Induration within the soft tissues at the base of the neck extending into the superior aspect of the left chest wall with associated skin thickening, which may reflect cellulitis.   CT Head (1/4) Subtle loss of gray-white differentiation in the anterior right lentiform insular ribbon. This could represent an acute/subacute infarct. Moderate generalized atrophy and diffuse white matter disease. This likely reflects the sequela of chronic microvascular ischemia.  MRI Brain WO Contrast (1/5) No acute intracranial abnormality.  CT Soft Tissue Neck wo Contrast (1/11) Persistent but slightly improved left greater than right lower facial and infrahyoid neck subcutaneous edema and presumed inflammatory changes extending into the anterior chest wall. No evidence of developing collection  CT Chest wo Contrast (1/11) No subcutaneous or soft tissue emphysema in the chest wall to suggest necrotizing fasciitis in the chest. Stable mild skin thickening with associated subcutaneous fat stranding in the high ventral left chest wall. New small dependent bilateral pleural effusions. New mild  interlobular septal thickening. New patchy ground-glass opacities in both lungs predominantly in the upper lobes. New patchy centrilobular micronodularity in the right upper lobe. Acute  anterior upper rib fractures bilaterally as detailed. No pneumothorax.  MR RIGHT ANKLE (1/14) No evidence of osteomyelitis, or soft tissue abscess. Mildly enhancing small ankle joint effusion which could be due to synovitis.Focal interstitial tear with posterior tibialis tenosynovitis. Mild flexor digitorum and flexor hallucis longus tenosynovitis.Focal complete disruption of the anterior talofibular ligament with fluid in the syndesmosis.Diffuse subcutaneous edema and cellulitis.  CULTURES:  Influenza A : Negative Influenza B : Negtive Sars Coronavirus: Negative MRSA Nasopharyngeal negative 1/4 Blood cultures: 4/4 Positive for H. Influenza  Urine culture: No growth 1/6 Blood cultures: NGTD on 1/11 1/13 Tracheal aspirate: few klebsiella pneumoniae  1/16 R ankle aspirate : monosodium urate crystals, no organisms on gram stain; culture w/ no growth to date  ANTIBIOTICS:  Bactrim 1/4 Azithromycin 1/4  Vancomycin 1/4 Zosyn  1/4 one dose Clindamycin 1/4 one dose Unasyn 1/5 --> (tentative end date 07/01/2019)  LINES/TUBES:  Tracheostomy - placed 1/4 Peripheral IV in Right Forearm - placed 1/9 Peripheral IV in Left Forearm - placed 1/9 PICC line double lumen - placed 1/15  CONSULTANTS:  Infectious Disease  ENT  Orthopedic surgery  SUBJECTIVE:  Overnight, patient remains afebrile and hemodynamically stable. However, was noted to have a 2.3g drop in Hb this morning for which given 2u pRBC. No overt signs of bleeding.   CONSTITUTIONAL: BP (!) 146/79   Pulse (!) 118   Temp 98.5 F (36.9 C) (Oral)   Resp (!) 21   Ht 5\' 8"  (1.727 m)   Wt 92.1 kg   SpO2 99%   BMI 30.87 kg/m   I/O last 3 completed shifts: In: 3621.8 [I.V.:1310.8; Other:90; NG/GT:1690; IV Piggyback:531] Out: 1995 [Urine:1695; Emesis/NG output:100; Stool:200]   Vent Mode: PRVC FiO2 (%):  [40 %] 40 % Set Rate:  [18 bmp] 18 bmp Vt Set:  [540 mL] 540 mL PEEP:  [5 cmH20] 5 cmH20 Plateau Pressure:  [20  cmH20-26 cmH20] 26 cmH20   PHYSICAL EXAM: General:  Laying in bed, no acute distress, on vent, non diaphoretic  Neuro: Patient sedated, unable to arouse, gag reflex intact  HEENT:  Trach in place with continued neck swelling, no erythema or purulence noted from trach site.  Cardiovascular:  RRR, no murmurs, rubs, or gallops Lungs:  Lungs CTAB Abdomen:  BS+, distended Musculoskeletal:  Right ankle edematous and erythematous, Skin: Edema and erythema of the left side of face, neck , chest extending to mid clavicular line and induration extending to left axilla, unchanged.  ASSESSMENT AND PLAN   Severe Soft Tissue Cellulitis  Acute Airway compromise s/p emergent percutaneous tracheostomy H. Influenza Bacteremia 4/4 bottles  Cellulitis of left neck extended to anterior chest , with blood cultures growing H. Flu on 1/4 requiring tracheostomy for airway protection. CT neck and chest 1/11 which showed persistent cellulitis but no signs of subcutaneous emphysema that would suggest necrotizing fascitis. ID was consulted and 3 weeks of antibiotics was recommended due to extent of cellulitis. ENT consulted - no concerns for necrotizing fascitis at this time. Physical exam with continued cellulitis of left neck and chest, slightly improved from prior exam. Leukocytosis trending down.  Plan: - Continue Unasyn for H. Influenza with plans for transition to PO Augmentin when able (end date 07/01/2019) - Trend vitals, CBC - PRN Tylenol for fever - Continue trach collar trials as tolerated.   R Ankle Gout:  Patient noted to have right ankle warmth, erythema and edema on examination. MRI ankle with diffuse subcutaneous edema and cellulitis with tenosynovitis. Synovial fluid shows monosodium urate crystals and WBC 3675. Gram stain w/o any organisms.  - Prednisone taper + colchicine   CAP: Multifocal pneumonia on CT Chest. Negative RVP. History of Streptococcus Intermedius empyema in 11/2018 s/p VATS CXR with  persistent prominent bilateral pulmonary infiltrates/edema Plan: - Continue Unasyn  Agitation Alcohol Use Disorder Patient becomes agitated when weaned off sedatives. Seroquel was increased. QTc 439. Patient remains on precedex and dilaudid gtt.  Plan: - Start methodone 5mg  tid for 3 days, uptitrate dose as needed after that  - Attempt to wean off dilaudid gtt as tolerated  - Discontinuing oxycodone and increasing gabapentin  - Continue Clonazepam, clonidine and seroquel  - Wean sedation as tolerated   Abdominal Distension:  Patient noted to have significant abdominal distension on 1/15 for which inflammatory labs and abdominal X-ray checked without any overt signs of an acute abdominal process. Given miralax, senna and lactulose after which has had ~271ml brown stool output via flexiseal without any evidence of hematochezia or melena. Patient's abdomen remains distended today with hyperactive bowel sounds.   Plan:  - Scheduled bowel regimen   Acute on chronic normocytic Anemia Hb 5.2 this morning; no obvious signs of bleeding; likely in setting of critical illness. Patient received 2u pRBC P:  - post transfusion H/H  - Trend Hb/Hct - Transfuse for Hb <7  Best Practice / Goals of Care / Disposition.   DVT PROPHYLAXIS: Subq Heparin SUP: Pepcid bid NUTRITION: tube feeds MOBILITY: OOB as tolerated with PT/OT Code Status: Full  FAMILY DISCUSSIONS: Will update daughter and significant other  DISPOSITION : continues to require ICU level care   LABS  Glucose Recent Labs  Lab 06/25/19 1118 06/25/19 1525 06/25/19 1936 06/25/19 2346 06/26/19 0340 06/26/19 0751  GLUCAP 154* 179* 182* 127* 148* 125*    BMET Recent Labs  Lab 06/24/19 0329 06/25/19 0440 06/26/19 0500  NA 140 140 141  K 4.1 3.9 3.9  CL 102 101 104  CO2 29 30 29   BUN 10 16 14   CREATININE 0.61 0.66 0.65  GLUCOSE 113* 122* 150*    Liver Enzymes Recent Labs  Lab 06/24/19 0329 06/25/19 0440  06/26/19 0500  AST 14* 16 12*  ALT 15 15 14   ALKPHOS 128* 138* 129*  BILITOT 0.7 0.5 0.5  ALBUMIN 1.4* 1.6* 1.7*    Electrolytes Recent Labs  Lab 06/20/19 0701 06/21/19 0543 06/22/19 0404 06/22/19 1333 06/24/19 0329 06/25/19 0440 06/26/19 0500  CALCIUM 8.3*   < > 8.3*   < > 8.2* 8.4* 8.4*  MG 2.0  --  2.0  --   --  2.3  --   PHOS 5.6*  --   --   --   --  5.2*  --    < > = values in this interval not displayed.    CBC Recent Labs  Lab 06/24/19 0329 06/25/19 0440 06/26/19 0500  WBC 8.5 11.3* 10.7*  HGB 7.2* 7.5* 5.2*  HCT 25.1* 26.2* 18.4*  PLT 506* 637* 698*    ABG No results for input(s): PHART, PCO2ART, PO2ART in the last 168 hours.  Coag's No results for input(s): APTT, INR in the last 168 hours.  Sepsis Markers Recent Labs  Lab 06/19/19 1513 06/22/19 1333  LATICACIDVEN 1.1 0.8    Cardiac Enzymes No results for input(s): TROPONINI, PROBNP in the last 168 hours.  CRITICAL CARE  Harvie Heck, MD Internal Medicine, PGY-1 Pager # [phone] 06/26/2019  8:46 AM   Please see Attending A/P and/or Addendum for final recommendations.

## 2019-06-26 NOTE — Progress Notes (Signed)
[facility] Progress Note Patient Name: [name]. DOB: May 05, 1961 MRN: [id number]   Date of Service  06/26/2019  HPI/Events of Note  Anemia - Hgb = 5.2. No evidence of active bleeding.   eICU Interventions  Will order: 1. Transfuse 2 units PRBC.     Intervention Category Major Interventions: Other:  Lysle Dingwall 06/26/2019, 6:27 AM

## 2019-06-26 NOTE — Progress Notes (Signed)
Physical Therapy Treatment Patient Details Name: [name]. MRN: [id number] DOB: 01/26/1961 Today's Date: 06/26/2019    History of Present Illness 59 year old man with a history of alcohol use disorder, tobacco used disorder, HTN, and recent admission on 04/10/2019 for pancreatitis and hyponatremia who presents for evaluation of shortness of breath and neck swelling.     PT Comments    Pt more alert and able to follow commands and actively participate in therapy today. Pt with no resistance to transfers but actively contributed despite still requiring mod/maxA for supine to sit, sit to stand transfers. Pt remains extremely deconditioned with decreased insight to safety and deficits. Pt tolerated sitting EOB x 10 min, HR inc from 142-160s. Returned to 140s at end of session.Continue to recommend SNF upon d/c for maximal functional recovery for safe transition home with wife.    Follow Up Recommendations  SNF;Supervision for mobility/OOB     Equipment Recommendations  None recommended by PT    Recommendations for Other Services       Precautions / Restrictions Precautions Precautions: Fall Precaution Comments: trach, bilat mittens Restrictions Weight Bearing Restrictions: No    Mobility  Bed Mobility Overal bed mobility: Needs Assistance Bed Mobility: Rolling;Sidelying to Sit;Sit to Sidelying Rolling: Min assist Sidelying to sit: Mod assist;+2 for physical assistance     Sit to sidelying: Mod assist;Max assist;+2 for physical assistance General bed mobility comments: pt reached across with R UE for L hand rail to roll. modAx2 for trunk elevation to bring LE off EOB, line mangement and to maintain balance, pt with strong R lateral lean despite max verbal and tactile directional cues  Transfers Overall transfer level: Needs assistance Equipment used: (2 person lift with gait belt and bed pad) Transfers: Sit to/from Stand Sit to Stand: Max assist;+2 physical  assistance         General transfer comment: attempted to stand x 3 however pt unable to complete full upright standing, able to come up 1/2 way, pt very deconditioned, pt also with strong retropulsion to the R  Ambulation/Gait             General Gait Details: unable at this time   Stairs             Wheelchair Mobility    Modified Rankin (Stroke Patients Only)       Balance Overall balance assessment: Needs assistance Sitting-balance support: Feet supported;Bilateral upper extremity supported Sitting balance-Leahy Scale: Poor Sitting balance - Comments: very strong R lateral lean, able to self correct with verbal cues but unable to maintain, required modA to maintain midline upright posture                                    Cognition Arousal/Alertness: Awake/alert Behavior During Therapy: Restless Overall Cognitive Status: Difficult to assess                                 General Comments: pt able to follow commands majority of time. pt with decreaed insight to deficits and safety as pt asking for a "smoke" even though he can report he is in the hospital, pt able to initiate and sequence transfers with verbal cues      Exercises General Exercises - Lower Extremity Long Arc Quad: AROM;Both;10 reps;Seated Hip Flexion/Marching: AROM;Both;10 reps;Seated    General Comments General comments (  skin integrity, edema, etc.): pt with swollen LEs and scrotum      Pertinent Vitals/Pain Pain Assessment: Faces Faces Pain Scale: No hurt Pain Intervention(s): Monitored during session    Home Living Family/patient expects to be discharged to:: Private residence Living Arrangements: Spouse/significant other                  Prior Function            PT Goals (current goals can now be found in the care plan section) Progress towards PT goals: Progressing toward goals    Frequency    Min 2X/week      PT Plan  Current plan remains appropriate    Co-evaluation              AM-PAC PT "6 Clicks" Mobility   Outcome Measure  Help needed turning from your back to your side while in a flat bed without using bedrails?: Total Help needed moving from lying on your back to sitting on the side of a flat bed without using bedrails?: Total Help needed moving to and from a bed to a chair (including a wheelchair)?: Total Help needed standing up from a chair using your arms (e.g., wheelchair or bedside chair)?: Total Help needed to walk in hospital room?: Total Help needed climbing 3-5 steps with a railing? : Total 6 Click Score: 6    End of Session Equipment Utilized During Treatment: Oxygen Activity Tolerance: Patient limited by fatigue Patient left: in bed;with call bell/phone within reach;with chair alarm set;with family/visitor present;with nursing/sitter in room;with restraints reapplied Nurse Communication: Mobility status PT Visit Diagnosis: Muscle weakness (generalized) (M62.81);Difficulty in walking, not elsewhere classified (R26.2)     Time: [id number] PT Time Calculation (min) (ACUTE ONLY): 39 min  Charges:  $Therapeutic Exercise: 8-22 mins $Therapeutic Activity: 23-37 mins                     Kittie Plater, PT, DPT Acute Rehabilitation Services Pager #: [phone] Office #: [phone]    Berline Lopes 06/26/2019, 11:40 AM

## 2019-06-26 NOTE — Progress Notes (Signed)
Pt placed back on vent on full support for the night.  Increased respiratory rate, work of breathing and heart rate at this time.  RT will continue to monitor.

## 2019-06-27 ENCOUNTER — Inpatient Hospital Stay (HOSPITAL_COMMUNITY): Payer: Medicaid Other

## 2019-06-27 DIAGNOSIS — R609 Edema, unspecified: Secondary | ICD-10-CM

## 2019-06-27 LAB — TYPE AND SCREEN
ABO/RH(D): AB POS
Antibody Screen: NEGATIVE
Unit division: 0
Unit division: 0

## 2019-06-27 LAB — GLUCOSE, CAPILLARY
Glucose-Capillary: 109 mg/dL — ABNORMAL HIGH (ref 70–99)
Glucose-Capillary: 108 mg/dL — ABNORMAL HIGH (ref 70–99)
Glucose-Capillary: 120 mg/dL — ABNORMAL HIGH (ref 70–99)
Glucose-Capillary: 135 mg/dL — ABNORMAL HIGH (ref 70–99)
Glucose-Capillary: 85 mg/dL (ref 70–99)

## 2019-06-27 LAB — BPAM RBC
Blood Product Expiration Date: 202102092359
Blood Product Expiration Date: 202102092359
ISSUE DATE / TIME: 202101180824
ISSUE DATE / TIME: 202101181052
Unit Type and Rh: 6200
Unit Type and Rh: 6200

## 2019-06-27 LAB — CBC
HCT: 31.9 % — ABNORMAL LOW (ref 39.0–52.0)
Hemoglobin: 9.4 g/dL — ABNORMAL LOW (ref 13.0–17.0)
MCH: 25.1 pg — ABNORMAL LOW (ref 26.0–34.0)
MCHC: 29.5 g/dL — ABNORMAL LOW (ref 30.0–36.0)
MCV: 85.1 fL (ref 80.0–100.0)
Platelets: 538 10*3/uL — ABNORMAL HIGH (ref 150–400)
RBC: 3.75 MIL/uL — ABNORMAL LOW (ref 4.22–5.81)
RDW: 18.3 % — ABNORMAL HIGH (ref 11.5–15.5)
WBC: 8.1 10*3/uL (ref 4.0–10.5)
nRBC: 0 % (ref 0.0–0.2)

## 2019-06-27 LAB — COMPREHENSIVE METABOLIC PANEL
ALT: 14 U/L (ref 0–44)
AST: 14 U/L — ABNORMAL LOW (ref 15–41)
Albumin: 1.8 g/dL — ABNORMAL LOW (ref 3.5–5.0)
Alkaline Phosphatase: 124 U/L (ref 38–126)
Anion gap: 7 (ref 5–15)
BUN: 13 mg/dL (ref 6–20)
CO2: 30 mmol/L (ref 22–32)
Calcium: 8.2 mg/dL — ABNORMAL LOW (ref 8.9–10.3)
Chloride: 105 mmol/L (ref 98–111)
Creatinine, Ser: 0.67 mg/dL (ref 0.61–1.24)
GFR calc Af Amer: >60 mL/min (ref >60)
GFR calc non Af Amer: >60 mL/min (ref >60)
Glucose, Bld: 123 mg/dL — ABNORMAL HIGH (ref 70–99)
Potassium: 3.8 mmol/L (ref 3.5–5.1)
Sodium: 142 mmol/L (ref 135–145)
Total Bilirubin: 0.5 mg/dL (ref 0.3–1.2)
Total Protein: 5.7 g/dL — ABNORMAL LOW (ref 6.5–8.1)

## 2019-06-27 MED ORDER — MIDAZOLAM HCL 2 MG/2ML IJ SOLN: 2.0000 mg | INTRAMUSCULAR | Status: DC | PRN

## 2019-06-27 MED ORDER — SENNOSIDES 8.8 MG/5ML PO SYRP: 5.0000 mL | ORAL_SOLUTION | Freq: Every day | ORAL | Status: DC

## 2019-06-27 MED ORDER — FUROSEMIDE 10 MG/ML IJ SOLN
40.0000 mg | Freq: Once | INTRAMUSCULAR | Status: AC
  Administered 2019-06-27: 11:00:00 40 mg via INTRAVENOUS
  Filled 2019-06-27: qty 4

## 2019-06-27 MED ORDER — POLYETHYLENE GLYCOL 3350 17 G PO PACK: 17.0000 g | PACK | Freq: Every day | ORAL | Status: DC

## 2019-06-27 MED ORDER — INSULIN ASPART 100 UNIT/ML ~~LOC~~ SOLN: 0.0000 [IU] | Freq: Three times a day (TID) | SUBCUTANEOUS | Status: DC

## 2019-06-27 MED ORDER — METOPROLOL TARTRATE 25 MG/10 ML ORAL SUSPENSION
25.0000 mg | Freq: Two times a day (BID) | ORAL | Status: DC
  Filled 2019-06-27: qty 10

## 2019-06-27 MED ORDER — FENTANYL CITRATE (PF) 100 MCG/2ML IJ SOLN: 25.0000 ug | INTRAMUSCULAR | Status: DC | PRN

## 2019-06-27 MED ORDER — INSULIN ASPART 100 UNIT/ML ~~LOC~~ SOLN
0.0000 [IU] | SUBCUTANEOUS | Status: DC
  Administered 2019-06-27 – 2019-07-26 (×27): 2 [IU] via SUBCUTANEOUS
  Administered 2019-07-27: 3 [IU] via SUBCUTANEOUS
  Administered 2019-07-28 – 2019-07-31 (×4): 2 [IU] via SUBCUTANEOUS

## 2019-06-27 MED ORDER — POTASSIUM CHLORIDE 20 MEQ/15ML (10%) PO SOLN: 10.0000 meq | Freq: Every day | ORAL | Status: DC

## 2019-06-27 MED ORDER — COLCHICINE 0.6 MG PO TABS
0.6000 mg | ORAL_TABLET | Freq: Every day | ORAL | Status: AC
  Administered 2019-06-28 – 2019-07-01 (×4): 0.6 mg
  Filled 2019-06-27 (×4): qty 1

## 2019-06-27 MED ORDER — METOPROLOL TARTRATE 25 MG/10 ML ORAL SUSPENSION
25.0000 mg | Freq: Two times a day (BID) | ORAL | Status: DC
  Administered 2019-06-27 – 2019-06-29 (×6): 25 mg
  Filled 2019-06-27 (×6): qty 10

## 2019-06-27 NOTE — Progress Notes (Signed)
Right upper extremity venous duplex complete.  Preliminary results given to RN at bedside.  Please see CV proc tab for preliminary results. Bruce, RVT 2:27 PM  06/27/2019

## 2019-06-27 NOTE — Progress Notes (Signed)
Occupational Therapy Treatment Patient Details Name: [name]. MRN: [id number] DOB: 1960-07-20 Today's Date: 06/27/2019    History of present illness 59 year old man with a history of alcohol use disorder, tobacco used disorder, HTN, and recent admission on 04/10/2019 for pancreatitis and hyponatremia who presents for evaluation of shortness of breath and neck swelling.    OT comments  Patient requires +2 total assist to reposition to [facility].  Patient following minimal commands, highly distracted and eager to get OOB today; deferred as HR supine high 120s.  Engaged in supine UE exercises x 1 set 10 reps of B UE flexion/extension and hand flexion/extension, pt declining further exercises as fatigued.  Continue per POC. DC plan remains appropriate.    Follow Up Recommendations  SNF;Supervision/Assistance - 24 hour    Equipment Recommendations  Other (comment)(TBD at next venue of care )    Recommendations for Other Services      Precautions / Restrictions Precautions Precautions: Fall Precaution Comments: trach collar, bilat mittens Restrictions Weight Bearing Restrictions: No       Mobility Bed Mobility Overal bed mobility: Needs Assistance             General bed mobility comments: requires total assist +2 to reposition in bed, RN deferred OOB activitiy today due to elevated HR   Transfers                 General transfer comment: deferred     Balance                                           ADL either performed or assessed with clinical judgement   ADL                                               Vision       Perception     Praxis      Cognition Arousal/Alertness: Awake/alert Behavior During Therapy: Restless Overall Cognitive Status: Difficult to assess                                 General Comments: patient following simple commands with increased time, poor awareness to safety  and understanding of deficits requiring redirection and attention to tasks        Exercises Exercises: General Upper Extremity General Exercises - Upper Extremity Shoulder Flexion: AAROM;Both;10 reps;Supine Shoulder Extension: AAROM;Both;10 reps;Supine Digit Composite Flexion: AAROM;10 reps;Both;Supine Composite Extension: AROM;Both;10 reps;Supine   Shoulder Instructions       General Comments patient on trach collar today, VSS with 40% FiO2 10L     Pertinent Vitals/ Pain       Pain Assessment: Faces Faces Pain Scale: No hurt Pain Intervention(s): Monitored during session  Home Living                                          Prior Functioning/Environment              Frequency  Min 2X/week        Progress Toward Goals  OT Goals(current goals can now be  found in the care plan section)  Progress towards OT goals: Progressing toward goals  Acute Rehab OT Goals Patient Stated Goal: unable to state OT Goal Formulation: Patient unable to participate in goal setting  Plan Discharge plan remains appropriate;Frequency remains appropriate    Co-evaluation                 AM-PAC OT "6 Clicks" Daily Activity     Outcome Measure   Help from another person eating meals?: Total Help from another person taking care of personal grooming?: Total Help from another person toileting, which includes using toliet, bedpan, or urinal?: Total Help from another person bathing (including washing, rinsing, drying)?: Total Help from another person to put on and taking off regular upper body clothing?: Total Help from another person to put on and taking off regular lower body clothing?: Total 6 Click Score: 6    End of Session Equipment Utilized During Treatment: Oxygen(via trach collar )  OT Visit Diagnosis: Other abnormalities of gait and mobility (R26.89);Muscle weakness (generalized) (M62.81);Other symptoms and signs involving cognitive function    Activity Tolerance Patient limited by lethargy;Patient limited by fatigue   Patient Left in bed;with call bell/phone within reach;with bed alarm set;with restraints reapplied   Nurse Communication Mobility status        Time: 1210-1225 OT Time Calculation (min): 15 min  Charges: OT General Charges $OT Visit: 1 Visit OT Treatments $Therapeutic Exercise: 8-22 mins  Jolaine Artist, [place] Pager [phone] Office [phone]    [name] 06/27/2019, 1:18 PM

## 2019-06-27 NOTE — Progress Notes (Addendum)
PULMONARY / CRITICAL CARE MEDICINE  NAME:  [name]., MRN:  [id number], DOB:  22-Jan-1961, LOS: 9 ADMISSION DATE:  06/12/2019, CONSULTATION DATE:  06/12/2019 REFERRING MD:  Alinda Sierras, CHIEF COMPLAINT:  Shortness of breath, sore throat, and neck swelling  BRIEF HISTORY:    59 year old man with a history of alcohol use disorder, tobacco used disorder, HTN, and recent admission on 04/10/2019 for pancreatitis and hyponatremia who presents for evaluation of shortness of breath and neck swelling. Reported  to have started 5 days ago and progressively gotten worse. Patient tachycardic to 160's and tachypnic to the 40's in the ED. Seen in ED, patient says he has neck swelling , cough, and difficulty breathing. Admitted to ICU on 1/4 with loss of airway and PEA arrest requiring emergent tracheostomy.   SIGNIFICANT PAST MEDICAL HISTORY    Past Medical History:  Diagnosis Date  . Alcohol abuse   . Alcoholic ([place])   . Back pain   . CAP (community acquired pneumonia) 11/26/2018  . COPD (chronic obstructive pulmonary disease) ([place])   . Empyema of right pleural space ([place]) 11/26/2018  . Heavy smoker   . Hep C w/ coma, chronic ([place])   . Loose, teeth   . Pancreatitis   . Poor dentition   . Tobacco abuse    SIGNIFICANT EVENTS:  Admit to ICU on 1/4  1/4 : Lost airway and PEA arrest on, requiring emergent tracheostomy and ROSC obtained 1/5: 4/4 Blood cultures growing H.Influenza , narrowed to Unasyn 1/5 > 1/14: Continued on antibiotics without any significant improvement  1/14: New right foot swelling for which MRI obtained due to concerns for seeding of cellulitis from neck 1/15: MRI with synovitis and subcutaneous edema suggestive of cellulitis; orthopedics consulted for right ankle aspiration - aspirate with positive crystals suggestive of gout - patient started on NSAIDs and steroids 1/16 Started on dilaudid, D/C fentanyl  1/17: Patient agitated early AM, q2h prn versed prescribed 1/18: Hb  5.2, transfused 2u pRBC; started methadone   STUDIES:    CT Soft Tissue Neck W/ Contrast (1/4)  Left greater than right lower facial and infrahyoid neck subcutaneous edema and inflammatory changes extending into the left anterior chest wall. There is no collection. Possible pharyngeal and supraglottic laryngeal wall edema, which could be accentuated by artifact. There is mild thickening of the epiglottis. Airway remains patent. No evidence of peritonsillar abscess.   CT Chest W Contrast (1/4) Patchy airspace consolidations within the lingula and dependent left lower lobe, with some volume loss within the right lung base. Findings are favored to represent multifocal pneumonia.  Induration within the soft tissues at the base of the neck extending into the superior aspect of the left chest wall with associated skin thickening, which may reflect cellulitis.   CT Head (1/4) Subtle loss of gray-white differentiation in the anterior right lentiform insular ribbon. This could represent an acute/subacute infarct. Moderate generalized atrophy and diffuse white matter disease. This likely reflects the sequela of chronic microvascular ischemia.  MRI Brain WO Contrast (1/5) No acute intracranial abnormality.  CT Soft Tissue Neck wo Contrast (1/11) Persistent but slightly improved left greater than right lower facial and infrahyoid neck subcutaneous edema and presumed inflammatory changes extending into the anterior chest wall. No evidence of developing collection  CT Chest wo Contrast (1/11) No subcutaneous or soft tissue emphysema in the chest wall to suggest necrotizing fasciitis in the chest. Stable mild skin thickening with associated subcutaneous fat stranding in the high ventral left chest  wall. New small dependent bilateral pleural effusions. New mild interlobular septal thickening. New patchy ground-glass opacities in both lungs predominantly in the upper lobes. New  patchy centrilobular micronodularity in the right upper lobe. Acute anterior upper rib fractures bilaterally as detailed. No pneumothorax.  MR RIGHT ANKLE (1/14) No evidence of osteomyelitis, or soft tissue abscess. Mildly enhancing small ankle joint effusion which could be due to synovitis.Focal interstitial tear with posterior tibialis tenosynovitis. Mild flexor digitorum and flexor hallucis longus tenosynovitis.Focal complete disruption of the anterior talofibular ligament with fluid in the syndesmosis.Diffuse subcutaneous edema and cellulitis.  CULTURES:  Influenza A : Negative Influenza B : Negtive Sars Coronavirus: Negative MRSA Nasopharyngeal negative 1/4 Blood cultures: 4/4 Positive for H. Influenza  Urine culture: No growth 1/6 Blood cultures: NGTD on 1/11 1/13 Tracheal aspirate: few klebsiella pneumoniae  1/16 R ankle aspirate : monosodium urate crystals, no organisms on gram stain; culture w/ no growth to date  ANTIBIOTICS:  Bactrim 1/4 Azithromycin 1/4  Vancomycin 1/4 Zosyn  1/4 one dose Clindamycin 1/4 one dose Unasyn 1/5 --> (tentative end date 07/01/2019)  LINES/TUBES:  Tracheostomy - placed 1/4 Peripheral IV in Right Forearm - placed 1/9 Peripheral IV in Left Forearm - placed 1/9 PICC line double lumen - placed 1/15  CONSULTANTS:  Infectious Disease  ENT  Orthopedic surgery  SUBJECTIVE:  Patient more awake and alert yesterday and able to participate with PT/OT yesterday.   CONSTITUTIONAL: BP (!) 142/76   Pulse (!) 59   Temp 99.3 F (37.4 C) (Oral)   Resp 18   Ht 5\' 8"  (1.727 m)   Wt 95.2 kg   SpO2 100%   BMI 31.91 kg/m   I/O last 3 completed shifts: In: [id number] [I.V.:1428.6; Blood:950; NG/GT:1270; IV Piggyback:1641.5] Out: [id number] [Urine:2535; Stool:700]   Vent Mode: PRVC FiO2 (%):  [40 %] 40 % Set Rate:  [18 bmp] 18 bmp Vt Set:  [540 mL] 540 mL PEEP:  [5 cmH20] 5 cmH20 Plateau Pressure:  [23 [id number] cmH20] 23 cmH20   PHYSICAL  EXAM: General:  Laying in bed, no acute distress, on vent, non diaphoretic  Neuro: Patient awake, responsive to verbal stimuli HEENT:  Trach in place with continued neck swelling, no erythema or purulence noted from trach site.  Cardiovascular:  RRR, no murmurs, rubs, or gallops Lungs:  Lungs CTAB Abdomen:  BS+, distended but soft Musculoskeletal:  Right ankle edema improving  Skin: Edema and erythema of the left side of face, neck , chest extending to mid clavicular line; improved from prior exam   ASSESSMENT AND PLAN   Severe Soft Tissue Cellulitis  Acute Airway compromise s/p emergent percutaneous tracheostomy H. Influenza Bacteremia 4/4 bottles  Cellulitis of left neck extended to anterior chest , with blood cultures growing H. Flu on 1/4 requiring tracheostomy for airway protection. CT neck and chest 1/11 with persistent cellulitis but no signs of  necrotizing fascitis. Per ID recommendations, patient on 3 week course of Unasyn. Physical exam with continued cellulitis of left neck and chest improved from prior exam. Leukocytosis improving.  Plan: - Continue Unasyn (end date 07/01/2019) - Trend vitals, CBC - PRN Tylenol for fever - Continue trach collar trials as tolerated  Agitation Alcohol Use Disorder Patient has been difficult to wean from sedation due to agitation despite being on seroquel, clonidine and clonazepam. Patient started on methadone yesterday. Gabapentin and clonazepam doses increased. This morning, patient is off the dilaudid gtt. He is still requiring precedex for agitation but able to wean off  this morning. However, patient noted to have SVT when agitated.  Plan: - Continue methadone 5mg  tid (can uptitrate starting 1/21) - Continue clonazepam, clonidine, seroquel and gabapentin - Metoprolol 25mg  bid  - Weaning sedation as tolerated  Generalized edema R arm edema: Generalized edema in setting of fluid overload as patient has been receiving IV infusions for  several weeks. R arm is significantly more edematous than left. However, no significant pain and patient has been on heparin subQ for DVT prophylaxis.  Plan: IV Lasix 40mg  once RUE vascular US   R Ankle Gout:  Patient noted to have right ankle warmth, erythema and edema on examination. MRI ankle with diffuse subcutaneous edema and cellulitis with tenosynovitis. Synovial fluid shows monosodium urate crystals and WBC 3675. Gram stain w/o any organisms.  - Prednisone taper + colchicine   CAP: Multifocal pneumonia on CT Chest. Negative RVP. History of Streptococcus Intermedius empyema in 11/2018 s/p VATS CXR 1/17 with persistent prominent bilateral pulmonary infiltrates/edema. Leukocytosis improving  Plan: - Continue Unasyn  Abdominal Distension:  Improving with scheduled bowel regimen. Patient had ~500cc brown stool output via flexiseal overnight. This morning, abdominal distension improving with normoactive bowel sounds.  Plan:  - Continue scheduled bowel regimen   Acute on chronic normocytic Anemia Hb stable this morning.  P:  - Trend Hb/Hct - Transfuse for Hb <7  Best Practice / Goals of Care / Disposition.   DVT PROPHYLAXIS: Subq Heparin SUP: Pepcid bid NUTRITION: tube feeds MOBILITY: OOB as tolerated with PT/OT Code Status: Full  FAMILY DISCUSSIONS: will update daughter via phone today  DISPOSITION : continues to require ICU level care   LABS  Glucose Recent Labs  Lab 06/26/19 0751 06/26/19 1218 06/26/19 1548 06/26/19 1924 06/26/19 2325 06/27/19 0316  GLUCAP 125* 136* 138* 127* 119* 109*    BMET Recent Labs  Lab 06/25/19 0440 06/26/19 0500 06/27/19 0353  NA 140 141 142  K 3.9 3.9 3.8  CL 101 104 105  CO2 30 29 30   BUN 16 14 13   CREATININE 0.66 0.65 0.67  GLUCOSE 122* 150* 123*    Liver Enzymes Recent Labs  Lab 06/25/19 0440 06/26/19 0500 06/27/19 0353  AST 16 12* 14*  ALT 15 14 14   ALKPHOS 138* 129* 124  BILITOT 0.5 0.5 0.5  ALBUMIN 1.6* 1.7*  1.8*    Electrolytes Recent Labs  Lab 06/22/19 0404 06/22/19 1333 06/25/19 0440 06/26/19 0500 06/27/19 0353  CALCIUM 8.3*   < > 8.4* 8.4* 8.2*  MG 2.0  --  2.3  --   --   PHOS  --   --  5.2*  --   --    < > = values in this interval not displayed.    CBC Recent Labs  Lab 06/26/19 0500 06/26/19 1519 06/27/19 0353  WBC 10.7* 10.8* 8.1  HGB 5.2* 9.6* 9.4*  HCT 18.4* 32.5* 31.9*  PLT 698* 594* 538*    ABG No results for input(s): PHART, PCO2ART, PO2ART in the last 168 hours.  Coag's No results for input(s): APTT, INR in the last 168 hours.  Sepsis Markers Recent Labs  Lab 06/22/19 1333  LATICACIDVEN 0.8    Cardiac Enzymes No results for input(s): TROPONINI, PROBNP in the last 168 hours.   CRITICAL CARE  Harvie Heck, MD Internal Medicine, PGY-1 Pager # [phone] 06/27/2019  7:25 AM   Please see Attending A/P and/or Addendum for final recommendations.

## 2019-06-27 NOTE — Progress Notes (Signed)
Patient is actively attempting to slide out of bed despite restarting precedex and instructions for safety and frequent repositioning. Soft waist belt placed on patient to prevent him from falling

## 2019-06-27 NOTE — Progress Notes (Signed)
Nutrition Follow-up  DOCUMENTATION CODES:   Non-severe (moderate) malnutrition in context of chronic illness  INTERVENTION:   Continue TF via Cortrak tube:   Vital AF 1.2 increase to 65 ml/h (1560 ml per day)  Provides 1872 kcal, 117 gm protein, 1265 ml free water daily  NUTRITION DIAGNOSIS:  Moderate Malnutrition related to chronic illness(COPD, alcoholism) as evidenced by mild fat depletion, moderate fat depletion, mild muscle depletion.  Ongoing   GOAL:   Patient will meet greater than or equal to 90% of their needs  Met with TF  MONITOR:   Vent status, Labs, TF tolerance, Skin, I & O's  ASSESSMENT:   59 yo male admitted with SOB, neck swelling, sore throat. Found to have H. flu in blood. S/P PEA arrest requiring emergent tracheostomy 1/4. PMH includes alcohol abuse, dysphagia, chronic hepatitis C, heavy smoker, COPD, poor dentition.   Methadone started yesterday. Sedation being weaned as able. Patient on ventilator support via trach, trach collar trials continue. MV: 13 L/min Temp (24hrs), Avg:98.5 F (36.9 C), Min:97.9 F (36.6 C), Max:99.3 F (37.4 C)   Receiving Vital AF 1.2 at 65 ml/h, tolerating well via Cortrak tube.  Labs reviewed.  CBG's: [id number]  Medications reviewed.   Admission weight 69.6 kg Current weight 95.2 kg I/O + 24.7 L since admission   Diet Order:   Diet Order            Diet NPO time specified  Diet effective now              EDUCATION NEEDS:   Not appropriate for education at this time  Skin:  Skin Assessment: (cellulitis to neck, chest, ankle, foot, toe)  Last BM:  1/19  Height:   Ht Readings from Last 1 Encounters:  06/12/19 _0  (1.727 m)    Weight:   Wt Readings from Last 1 Encounters:  06/27/19 95.2 kg    Ideal Body Weight:  70 kg  BMI:  Body mass index is 31.91 kg/m.  Estimated Nutritional Needs:   Kcal:  1880  Protein:  100-115 gm  Fluid:  >/= 1.8 L    Molli Barrows, RD, LDN,  [place] Pager [phone] After Hours Pager [phone]

## 2019-06-27 NOTE — Progress Notes (Signed)
15 mls Dilaudid wasted and witnessed by Ilda Basset RN

## 2019-06-27 NOTE — Progress Notes (Signed)
[facility] Progress Note Patient Name: [name]. DOB: Aug 15, 1960 MRN: [id number]   Date of Service  06/27/2019  HPI/Events of Note  Bradycardia on precedex  eICU Interventions  D/c precedex  PRN fentanyl, versed  Continue Seroquel      Intervention Category Intermediate Interventions: Arrhythmia - evaluation and management  Darlina Sicilian 06/27/2019, 8:55 PM

## 2019-06-28 DIAGNOSIS — G9340 Encephalopathy, unspecified: Secondary | ICD-10-CM

## 2019-06-28 DIAGNOSIS — J9621 Acute and chronic respiratory failure with hypoxia: Secondary | ICD-10-CM

## 2019-06-28 DIAGNOSIS — A413 Sepsis due to Hemophilus influenzae: Principal | ICD-10-CM

## 2019-06-28 DIAGNOSIS — R601 Generalized edema: Secondary | ICD-10-CM

## 2019-06-28 DIAGNOSIS — I509 Heart failure, unspecified: Secondary | ICD-10-CM

## 2019-06-28 LAB — GLUCOSE, CAPILLARY
Glucose-Capillary: 100 mg/dL — ABNORMAL HIGH (ref 70–99)
Glucose-Capillary: 100 mg/dL — ABNORMAL HIGH (ref 70–99)
Glucose-Capillary: 100 mg/dL — ABNORMAL HIGH (ref 70–99)
Glucose-Capillary: 108 mg/dL — ABNORMAL HIGH (ref 70–99)
Glucose-Capillary: 108 mg/dL — ABNORMAL HIGH (ref 70–99)
Glucose-Capillary: 111 mg/dL — ABNORMAL HIGH (ref 70–99)
Glucose-Capillary: 143 mg/dL — ABNORMAL HIGH (ref 70–99)

## 2019-06-28 LAB — ANAEROBIC CULTURE

## 2019-06-28 LAB — BASIC METABOLIC PANEL
Anion gap: 9 (ref 5–15)
BUN: 11 mg/dL (ref 6–20)
CO2: 34 mmol/L — ABNORMAL HIGH (ref 22–32)
Calcium: 8.7 mg/dL — ABNORMAL LOW (ref 8.9–10.3)
Chloride: 99 mmol/L (ref 98–111)
Creatinine, Ser: 0.63 mg/dL (ref 0.61–1.24)
GFR calc Af Amer: >60 mL/min (ref >60)
GFR calc non Af Amer: >60 mL/min (ref >60)
Glucose, Bld: 109 mg/dL — ABNORMAL HIGH (ref 70–99)
Potassium: 3.6 mmol/L (ref 3.5–5.1)
Sodium: 142 mmol/L (ref 135–145)

## 2019-06-28 LAB — COMPREHENSIVE METABOLIC PANEL
ALT: 16 U/L (ref 0–44)
AST: 15 U/L (ref 15–41)
Albumin: 1.8 g/dL — ABNORMAL LOW (ref 3.5–5.0)
Alkaline Phosphatase: 125 U/L (ref 38–126)
Anion gap: 7 (ref 5–15)
BUN: 13 mg/dL (ref 6–20)
CO2: 32 mmol/L (ref 22–32)
Calcium: 8.5 mg/dL — ABNORMAL LOW (ref 8.9–10.3)
Chloride: 100 mmol/L (ref 98–111)
Creatinine, Ser: 0.68 mg/dL (ref 0.61–1.24)
GFR calc Af Amer: >60 mL/min (ref >60)
GFR calc non Af Amer: >60 mL/min (ref >60)
Glucose, Bld: 114 mg/dL — ABNORMAL HIGH (ref 70–99)
Potassium: 3.4 mmol/L — ABNORMAL LOW (ref 3.5–5.1)
Sodium: 139 mmol/L (ref 135–145)
Total Bilirubin: 0.6 mg/dL (ref 0.3–1.2)
Total Protein: 5.8 g/dL — ABNORMAL LOW (ref 6.5–8.1)

## 2019-06-28 LAB — CBC
HCT: 32.6 % — ABNORMAL LOW (ref 39.0–52.0)
Hemoglobin: 9.8 g/dL — ABNORMAL LOW (ref 13.0–17.0)
MCH: 25.5 pg — ABNORMAL LOW (ref 26.0–34.0)
MCHC: 30.1 g/dL (ref 30.0–36.0)
MCV: 84.9 fL (ref 80.0–100.0)
Platelets: 586 10*3/uL — ABNORMAL HIGH (ref 150–400)
RBC: 3.84 MIL/uL — ABNORMAL LOW (ref 4.22–5.81)
RDW: 18.9 % — ABNORMAL HIGH (ref 11.5–15.5)
WBC: 9.8 10*3/uL (ref 4.0–10.5)
nRBC: 0 % (ref 0.0–0.2)

## 2019-06-28 LAB — MAGNESIUM: Magnesium: 2 mg/dL (ref 1.7–2.4)

## 2019-06-28 LAB — PHOSPHORUS: Phosphorus: 5.1 mg/dL — ABNORMAL HIGH (ref 2.5–4.6)

## 2019-06-28 MED ORDER — QUETIAPINE FUMARATE 50 MG PO TABS
200.0000 mg | ORAL_TABLET | Freq: Two times a day (BID) | ORAL | Status: DC
  Administered 2019-06-28 – 2019-07-08 (×19): 200 mg
  Filled 2019-06-28 (×5): qty 4
  Filled 2019-06-28: qty 8
  Filled 2019-06-28 (×13): qty 4

## 2019-06-28 MED ORDER — ACETAMINOPHEN 650 MG RE SUPP: 650.0000 mg | RECTAL | Status: DC | PRN

## 2019-06-28 MED ORDER — ACETAMINOPHEN 325 MG PO TABS: 650.0000 mg | ORAL_TABLET | Freq: Four times a day (QID) | ORAL | Status: DC | PRN

## 2019-06-28 MED ORDER — FUROSEMIDE 10 MG/ML IJ SOLN
40.0000 mg | Freq: Once | INTRAMUSCULAR | Status: AC
  Administered 2019-06-28: 40 mg via INTRAVENOUS
  Filled 2019-06-28: qty 4

## 2019-06-28 MED ORDER — FENTANYL CITRATE (PF) 100 MCG/2ML IJ SOLN
50.0000 ug | INTRAMUSCULAR | Status: DC | PRN
  Administered 2019-06-28 – 2019-07-02 (×8): 100 ug via INTRAVENOUS
  Filled 2019-06-28 (×9): qty 2

## 2019-06-28 MED ORDER — POTASSIUM CHLORIDE 20 MEQ/15ML (10%) PO SOLN
40.0000 meq | Freq: Two times a day (BID) | ORAL | Status: AC
  Administered 2019-06-28 (×2): 40 meq
  Filled 2019-06-28: qty 30

## 2019-06-28 MED ORDER — POTASSIUM CHLORIDE 20 MEQ/15ML (10%) PO SOLN
40.0000 meq | Freq: Every day | ORAL | Status: DC
  Filled 2019-06-28: qty 30

## 2019-06-28 NOTE — Progress Notes (Addendum)
PULMONARY / CRITICAL CARE MEDICINE  NAME:  [name]., MRN:  [id number], DOB:  June 21, 1960, LOS: 31 ADMISSION DATE:  06/12/2019, CONSULTATION DATE:  06/12/2019 REFERRING MD:  Alinda Sierras, CHIEF COMPLAINT:  Shortness of breath, sore throat, and neck swelling  BRIEF HISTORY:    59 year old man with a history of alcohol use disorder, tobacco used disorder, HTN, and recent admission on 04/10/2019 for pancreatitis and hyponatremia who presents for evaluation of shortness of breath and neck swelling. Reported  to have started 5 days ago and progressively gotten worse. Patient tachycardic to 160's and tachypnic to the 40's in the ED. Seen in ED, patient says he has neck swelling , cough, and difficulty breathing. Admitted to ICU on 1/4 with loss of airway and PEA arrest requiring emergent tracheostomy. Patient has been difficult to wean off sedation   SIGNIFICANT PAST MEDICAL HISTORY    Past Medical History:  Diagnosis Date  . Alcohol abuse   . Alcoholic ([place])   . Back pain   . CAP (community acquired pneumonia) 11/26/2018  . COPD (chronic obstructive pulmonary disease) ([place])   . Empyema of right pleural space ([place]) 11/26/2018  . Heavy smoker   . Hep C w/ coma, chronic ([place])   . Loose, teeth   . Pancreatitis   . Poor dentition   . Tobacco abuse    SIGNIFICANT EVENTS:  Admit to ICU on 1/4  1/4 : Lost airway and PEA arrest on, requiring emergent tracheostomy and ROSC obtained 1/5: 4/4 Blood cultures growing H.Influenza , narrowed to Unasyn 1/5 > 1/14: Continued on antibiotics without any significant improvement  1/14: New right foot swelling for which MRI obtained due to concerns for seeding of cellulitis from neck 1/15: MRI with synovitis and subcutaneous edema suggestive of cellulitis; orthopedics consulted for right ankle aspiration - aspirate with positive crystals suggestive of gout - patient started on NSAIDs and steroids 1/16 Started on dilaudid, D/C fentanyl  1/17: Patient agitated  early AM, q2h prn versed prescribed 1/18: Hb 5.2, transfused 2u pRBC; started methadone  1/19: Klonopin increased to tid and metoprolol added for SVTs 1/20: precedex gtt discontinued overnight for concerns of bradycardia   STUDIES:    CT Soft Tissue Neck W/ Contrast (1/4)  Left greater than right lower facial and infrahyoid neck subcutaneous edema and inflammatory changes extending into the left anterior chest wall. There is no collection. Possible pharyngeal and supraglottic laryngeal wall edema, which could be accentuated by artifact. There is mild thickening of the epiglottis. Airway remains patent. No evidence of peritonsillar abscess.   CT Chest W Contrast (1/4) Patchy airspace consolidations within the lingula and dependent left lower lobe, with some volume loss within the right lung base. Findings are favored to represent multifocal pneumonia.  Induration within the soft tissues at the base of the neck extending into the superior aspect of the left chest wall with associated skin thickening, which may reflect cellulitis.   CT Head (1/4) Subtle loss of gray-white differentiation in the anterior right lentiform insular ribbon. This could represent an acute/subacute infarct. Moderate generalized atrophy and diffuse white matter disease. This likely reflects the sequela of chronic microvascular ischemia.  MRI Brain WO Contrast (1/5) No acute intracranial abnormality.  CT Soft Tissue Neck wo Contrast (1/11) Persistent but slightly improved left greater than right lower facial and infrahyoid neck subcutaneous edema and presumed inflammatory changes extending into the anterior chest wall. No evidence of developing collection  CT Chest wo Contrast (1/11) No subcutaneous or soft  tissue emphysema in the chest wall to suggest necrotizing fasciitis in the chest. Stable mild skin thickening with associated subcutaneous fat stranding in the high ventral left chest wall. New small  dependent bilateral pleural effusions. New mild interlobular septal thickening. New patchy ground-glass opacities in both lungs predominantly in the upper lobes. New patchy centrilobular micronodularity in the right upper lobe. Acute anterior upper rib fractures bilaterally as detailed. No pneumothorax.  MR RIGHT ANKLE (1/14) No evidence of osteomyelitis, or soft tissue abscess. Mildly enhancing small ankle joint effusion which could be due to synovitis.Focal interstitial tear with posterior tibialis tenosynovitis. Mild flexor digitorum and flexor hallucis longus tenosynovitis.Focal complete disruption of the anterior talofibular ligament with fluid in the syndesmosis.Diffuse subcutaneous edema and cellulitis.  CULTURES:  Influenza A : Negative Influenza B : Negtive Sars Coronavirus: Negative MRSA Nasopharyngeal negative 1/4 Blood cultures: 4/4 Positive for H. Influenza  Urine culture: No growth 1/6 Blood cultures: NGTD on 1/11 1/13 Tracheal aspirate: few klebsiella pneumoniae  1/16 R ankle aspirate : monosodium urate crystals, no organisms on gram stain; culture w/ no growth to date  ANTIBIOTICS:  Bactrim 1/4 Azithromycin 1/4  Vancomycin 1/4 Zosyn  1/4 one dose Clindamycin 1/4 one dose Unasyn 1/5 --> (tentative end date 07/01/2019)  LINES/TUBES:  Tracheostomy - placed 1/4 Peripheral IV in Right Forearm - placed 1/9 Peripheral IV in Left Forearm - placed 1/9 PICC line double lumen - placed 1/15  CONSULTANTS:  Infectious Disease  ENT  Orthopedic surgery  SUBJECTIVE:  Overnight, patient had bradycardia on precedex. Precedex was discontinued and fentanyl and versed prn ordered. Patient is awake and alert this morning. He reports some right upper extremity pain and requests something to drink.   CONSTITUTIONAL: BP (!) 124/104   Pulse 62   Temp 99.3 F (37.4 C) (Oral)   Resp 18   Ht 5\' 8"  (1.727 m)   Wt 89.7 kg   SpO2 97%   BMI 30.07 kg/m   I/O last 3 completed  shifts: In: 5129.6 [I.V.:1073.2; NG/GT:2297; IV Piggyback:1759.5] Out: [id number] [Urine:7385; Stool:1150]   Vent Mode: PRVC FiO2 (%):  [40 %] 40 % Set Rate:  [18 bmp] 18 bmp Vt Set:  [540 mL] 540 mL PEEP:  [5 cmH20] 5 cmH20 Plateau Pressure:  [23 [id number] cmH20] 23 cmH20   PHYSICAL EXAM: General:  Laying in bed, no acute distress, on trach collar, non diaphoretic  Neuro: Patient awake, responsive to verbal stimuli HEENT:  Trach in place with continued neck swelling, no erythema or purulence noted from trach site.  Cardiovascular:  RRR, no murmurs, rubs, or gallops Lungs:  Lungs CTAB Abdomen:  BS+, distended but soft Musculoskeletal:  Right ankle edema improved Skin: Edema and erythema of the left side of face, neck , chest extending to mid clavicular line; improved from prior exam   ASSESSMENT AND PLAN   Severe Soft Tissue Cellulitis  Acute Airway compromise s/p emergent percutaneous tracheostomy H. Influenza Bacteremia 4/4 bottles  Cellulitis of left neck extended to anterior chest , with blood cultures growing H. Flu on 1/4 requiring tracheostomy for airway protection. CT neck and chest 1/11 with persistent cellulitis but no signs of  necrotizing fascitis. Per ID recommendations, patient on 3 week course of Unasyn. Physical exam with continued cellulitis of left neck and chest improved from prior exam. Leukocytosis improving. Will hold off on swallowing evaluation until patient can be weaned off vent.  Plan: - Continue Unasyn (end date 07/01/2019) - Trend vitals, CBC - Continue trach collar trials  as tolerated - goal is 16 hours today  Agitation Alcohol Use Disorder Patient continued on seroquel, clonidine and clonazepam. Patient started on methadone on 1/18. Gabapentin and clonazepam doses increased 1/19. Patient off precedex gtt for concerns of bradycardia overnight.  Plan: - Continue methadone 5mg  tid (can uptitrate starting 1/21) - Seroquel increased to 200mg  bid - Continue  clonazepam, clonidine, and gabapentin - Metoprolol 25mg  bid  - Fentanyl prn  Generalized edema: Generalized edema in setting of fluid overload as patient has been receiving IV infusions for several weeks. Patient with ~7L UOP over 24hrs after receiving one dose lasix. This AM, patient still significantly edematous with prominent scrotal edema and is still positive fluid balance.  Plan: - IV Lasix 40mg  qd  RUE Superficial thrombophlebitis:  Patient noted to have significant RUE swelling and vascular US with superficial vein thrombosis of right cephalic vein. This morning, patient reports RUE pain. RUE with continued swelling.  Plan: - Tylenol prn - Warm compresses to RUE   R Ankle Gout:  Patient noted to have right ankle warmth, erythema and edema on examination. MRI ankle with diffuse subcutaneous edema and cellulitis with tenosynovitis. Synovial fluid shows monosodium urate crystals and WBC 3675. Gram stain w/o any organisms.  - Prednisone taper + colchicine   CAP: Multifocal pneumonia on CT Chest. Negative RVP. History of Streptococcus Intermedius empyema in 11/2018 s/p VATS CXR 1/17 with persistent prominent bilateral pulmonary infiltrates/edema. Leukocytosis improved Plan: - Continue Unasyn  Abdominal Distension:  Improving with scheduled bowel regimen. Patient had ~500cc brown stool output via flexiseal overnight. This morning, abdominal distension improving with hyperactive bowel sounds.  Plan:  - Continue scheduled bowel regimen   Electrolyte abnormalities: Hypokalemic (K 3.4) this morning in setting of diuresis. Plan:  - Monitor and replete electrolytes as needed  Acute on chronic normocytic Anemia Hb stable this morning.  P:  - Trend Hb/Hct - Transfuse for Hb <7  Best Practice / Goals of Care / Disposition.   DVT PROPHYLAXIS: Subq Heparin SUP: Pepcid bid NUTRITION: tube feeds MOBILITY: OOB as tolerated with PT/OT Code Status: Full  FAMILY DISCUSSIONS: daily  family updates  DISPOSITION : continues to require ICU level care   LABS  Glucose Recent Labs  Lab 06/27/19 1133 06/27/19 1559 06/27/19 2014 06/27/19 2345 06/28/19 0350 06/28/19 0719  GLUCAP 120* 135* 85 100* 108* 100*    BMET Recent Labs  Lab 06/26/19 0500 06/27/19 0353 06/28/19 0419  NA 141 142 139  K 3.9 3.8 3.4*  CL 104 105 100  CO2 29 30 32  BUN 14 13 13   CREATININE 0.65 0.67 0.68  GLUCOSE 150* 123* 114*    Liver Enzymes Recent Labs  Lab 06/26/19 0500 06/27/19 0353 06/28/19 0419  AST 12* 14* 15  ALT 14 14 16   ALKPHOS 129* 124 125  BILITOT 0.5 0.5 0.6  ALBUMIN 1.7* 1.8* 1.8*    Electrolytes Recent Labs  Lab 06/22/19 0404 06/22/19 1333 06/25/19 0440 06/25/19 0440 06/26/19 0500 06/27/19 0353 06/28/19 0419  CALCIUM 8.3*   < > 8.4*   < > 8.4* 8.2* 8.5*  MG 2.0  --  2.3  --   --   --   --   PHOS  --   --  5.2*  --   --   --   --    < > = values in this interval not displayed.    CBC Recent Labs  Lab 06/26/19 1519 06/27/19 0353 06/28/19 0419  WBC 10.8* 8.1 9.8  HGB 9.6* 9.4* 9.8*  HCT 32.5* 31.9* 32.6*  PLT 594* 538* 586*    ABG No results for input(s): PHART, PCO2ART, PO2ART in the last 168 hours.  Coag's No results for input(s): APTT, INR in the last 168 hours.  Sepsis Markers Recent Labs  Lab 06/22/19 1333  LATICACIDVEN 0.8    Cardiac Enzymes No results for input(s): TROPONINI, PROBNP in the last 168 hours.   CRITICAL CARE  Harvie Heck, MD Internal Medicine, PGY-1 Pager # [phone] 06/28/2019  7:29 AM   Please see Attending A/P and/or Addendum for final recommendations.

## 2019-06-28 NOTE — Progress Notes (Signed)
[facility] Progress Note Patient Name: [name]. DOB: 02/04/1961 MRN: [id number]   Date of Service  06/28/2019  HPI/Events of Note  Pt had urine output of 8 liters in response to 40 mg of Lasix  eICU Interventions  Will check BMET, magnesium, phosphorous        Gerrie Castiglia U Petra Dumler 06/28/2019, 7:46 PM

## 2019-06-28 NOTE — Progress Notes (Signed)
Pt palced back on full support on vent due to increased WOB

## 2019-06-28 NOTE — Progress Notes (Signed)
Physical Therapy Treatment Patient Details Name: [name]. MRN: [id number] DOB: Oct 15, 1960 Today's Date: 06/28/2019    History of Present Illness 59 year old man with a history of alcohol use disorder, tobacco used disorder, HTN, and recent admission on 04/10/2019 for pancreatitis and hyponatremia who presents for evaluation of shortness of breath and neck swelling.     PT Comments    Patient progressing this session seated at EOB with HR/activity tolerance improved.  Still confused and attempting to stand unaided/unsafely and attempting to pull lines despite mitts.  Feel OOB to chair remains too risky.  Patient will continue to benefit from skilled PT in the acute setting and remains appropriate for SNF level rehab at d/c.    Follow Up Recommendations  SNF;Supervision for mobility/OOB     Equipment Recommendations  None recommended by PT    Recommendations for Other Services       Precautions / Restrictions Precautions Precautions: Fall Precaution Comments: trach collar, bilat mittens, posey    Mobility  Bed Mobility Overal bed mobility: Needs Assistance Bed Mobility: Rolling;Sidelying to Sit Rolling: Min assist Sidelying to sit: Mod assist;+2 for safety/equipment     Sit to sidelying: Mod assist;+2 for physical assistance General bed mobility comments: assist to initiate rolling then assist for trunk upright and balance initially; to side assist for legs into bed, to re-position shoulders and to scoot up in bed  Transfers Overall transfer level: Needs assistance Equipment used: 2 person hand held assist Transfers: Sit to/from Stand Sit to Stand: Max assist;+2 physical assistance         General transfer comment: aattempted x 4 with pt able to lift off hips from EOB, but not extend trunk or hips despite facilitation through hips and trunk  Ambulation/Gait                 Stairs             Wheelchair Mobility    Modified Rankin (Stroke  Patients Only)       Balance Overall balance assessment: Needs assistance Sitting-balance support: Bilateral upper extremity supported;Feet supported Sitting balance-Leahy Scale: Poor Sitting balance - Comments: leans to R and back initially, up to mod A provided, but at times supervision, sat EOB About 12 minutes Postural control: Posterior lean;Right lateral lean   Standing balance-Leahy Scale: Zero Standing balance comment: cannot maintain standing                            Cognition Arousal/Alertness: Awake/alert Behavior During Therapy: Restless;Impulsive Overall Cognitive Status: Impaired/Different from baseline Area of Impairment: Attention;Following commands                   Current Attention Level: Focused   Following Commands: Follows one step commands with increased time;Follows one step commands inconsistently       General Comments: picking at lines through his mitts, decreased attention to task, impulsively attempting to stand      Exercises      General Comments General comments (skin integrity, edema, etc.): on trach collar with VSS, HR max 110      Pertinent Vitals/Pain Pain Assessment: Faces Faces Pain Scale: No hurt    Home Living                      Prior Function            PT Goals (current goals can now be found  in the care plan section) Progress towards PT goals: Progressing toward goals    Frequency    Min 2X/week      PT Plan Current plan remains appropriate    Co-evaluation              AM-PAC PT "6 Clicks" Mobility   Outcome Measure  Help needed turning from your back to your side while in a flat bed without using bedrails?: A Little Help needed moving from lying on your back to sitting on the side of a flat bed without using bedrails?: A Lot Help needed moving to and from a bed to a chair (including a wheelchair)?: Total Help needed standing up from a chair using your arms (e.g.,  wheelchair or bedside chair)?: Total Help needed to walk in hospital room?: Total Help needed climbing 3-5 steps with a railing? : Total 6 Click Score: 9    End of Session Equipment Utilized During Treatment: Oxygen Activity Tolerance: Patient limited by fatigue Patient left: in bed;with call bell/phone within reach;with restraints reapplied   PT Visit Diagnosis: Muscle weakness (generalized) (M62.81);Difficulty in walking, not elsewhere classified (R26.2)     Time: [id number] PT Time Calculation (min) (ACUTE ONLY): 26 min  Charges:  $Therapeutic Activity: 23-37 mins                     Magda Kiel, [place] [facility] [phone] 06/28/2019    [name] 06/28/2019, 12:21 PM

## 2019-06-29 DIAGNOSIS — I808 Phlebitis and thrombophlebitis of other sites: Secondary | ICD-10-CM

## 2019-06-29 DIAGNOSIS — E440 Moderate protein-calorie malnutrition: Secondary | ICD-10-CM

## 2019-06-29 LAB — CBC
HCT: 33 % — ABNORMAL LOW (ref 39.0–52.0)
Hemoglobin: 9.5 g/dL — ABNORMAL LOW (ref 13.0–17.0)
MCH: 24.7 pg — ABNORMAL LOW (ref 26.0–34.0)
MCHC: 28.8 g/dL — ABNORMAL LOW (ref 30.0–36.0)
MCV: 85.7 fL (ref 80.0–100.0)
Platelets: 560 10*3/uL — ABNORMAL HIGH (ref 150–400)
RBC: 3.85 MIL/uL — ABNORMAL LOW (ref 4.22–5.81)
RDW: 19.1 % — ABNORMAL HIGH (ref 11.5–15.5)
WBC: 6.9 10*3/uL (ref 4.0–10.5)
nRBC: 0 % (ref 0.0–0.2)

## 2019-06-29 LAB — BASIC METABOLIC PANEL
Anion gap: 9 (ref 5–15)
BUN: 12 mg/dL (ref 6–20)
CO2: 32 mmol/L (ref 22–32)
Calcium: 8.7 mg/dL — ABNORMAL LOW (ref 8.9–10.3)
Chloride: 101 mmol/L (ref 98–111)
Creatinine, Ser: 0.64 mg/dL (ref 0.61–1.24)
GFR calc Af Amer: >60 mL/min (ref >60)
GFR calc non Af Amer: >60 mL/min (ref >60)
Glucose, Bld: 133 mg/dL — ABNORMAL HIGH (ref 70–99)
Potassium: 3.4 mmol/L — ABNORMAL LOW (ref 3.5–5.1)
Sodium: 142 mmol/L (ref 135–145)

## 2019-06-29 LAB — GLUCOSE, CAPILLARY
Glucose-Capillary: 118 mg/dL — ABNORMAL HIGH (ref 70–99)
Glucose-Capillary: 122 mg/dL — ABNORMAL HIGH (ref 70–99)
Glucose-Capillary: 126 mg/dL — ABNORMAL HIGH (ref 70–99)
Glucose-Capillary: 115 mg/dL — ABNORMAL HIGH (ref 70–99)
Glucose-Capillary: 107 mg/dL — ABNORMAL HIGH (ref 70–99)
Glucose-Capillary: 98 mg/dL (ref 70–99)

## 2019-06-29 MED ORDER — ACETAMINOPHEN 325 MG PO TABS
650.0000 mg | ORAL_TABLET | Freq: Four times a day (QID) | ORAL | Status: DC | PRN
  Administered 2019-07-09 – 2019-07-26 (×4): 650 mg
  Filled 2019-06-29 (×4): qty 2

## 2019-06-29 MED ORDER — CLONIDINE HCL 0.2 MG PO TABS
0.2000 mg | ORAL_TABLET | Freq: Four times a day (QID) | ORAL | Status: DC
  Administered 2019-06-29 – 2019-06-30 (×4): 0.2 mg
  Filled 2019-06-29 (×4): qty 1

## 2019-06-29 MED ORDER — ACETAMINOPHEN 650 MG RE SUPP: 650.0000 mg | RECTAL | Status: DC | PRN

## 2019-06-29 MED ORDER — POTASSIUM CHLORIDE 20 MEQ/15ML (10%) PO SOLN
40.0000 meq | Freq: Two times a day (BID) | ORAL | Status: AC
  Administered 2019-06-29 (×2): 40 meq
  Filled 2019-06-29 (×2): qty 30

## 2019-06-29 MED ORDER — FUROSEMIDE 10 MG/ML IJ SOLN
20.0000 mg | Freq: Once | INTRAMUSCULAR | Status: AC
  Administered 2019-06-29: 11:00:00 20 mg via INTRAVENOUS
  Filled 2019-06-29: qty 2

## 2019-06-29 NOTE — Progress Notes (Signed)
PULMONARY / CRITICAL CARE MEDICINE  NAME:  [name]., MRN:  [id number], DOB:  [birth year], LOS: 63 ADMISSION DATE:  06/12/2019, CONSULTATION DATE:  06/12/2019 REFERRING MD:  Alinda Sierras, CHIEF COMPLAINT:  Shortness of breath, sore throat, and neck swelling  BRIEF HISTORY:    59 year old man with a history of alcohol use disorder, tobacco used disorder, HTN, and recent admission on 04/10/2019 for pancreatitis and hyponatremia who presents for evaluation of shortness of breath and neck swelling. Reported  to have started 5 days ago and progressively gotten worse. Patient tachycardic to 160's and tachypnic to the 40's in the ED. Seen in ED, patient says he has neck swelling , cough, and difficulty breathing. Admitted to ICU on 1/4 with loss of airway and PEA arrest requiring emergent tracheostomy. Patient has been difficult to wean off sedation   SIGNIFICANT PAST MEDICAL HISTORY    Past Medical History:  Diagnosis Date  . Alcohol abuse   . Alcoholic ([place])   . Back pain   . CAP (community acquired pneumonia) 11/26/2018  . COPD (chronic obstructive pulmonary disease) ([place])   . Empyema of right pleural space ([place]) 11/26/2018  . Heavy smoker   . Hep C w/ coma, chronic ([place])   . Loose, teeth   . Pancreatitis   . Poor dentition   . Tobacco abuse    SIGNIFICANT EVENTS:  Admit to ICU on 1/4  1/4 : Lost airway and PEA arrest on, requiring emergent tracheostomy and ROSC obtained 1/5: 4/4 Blood cultures growing H.Influenza , narrowed to Unasyn 1/5 > 1/14: Continued on antibiotics without any significant improvement  1/14: New right foot swelling for which MRI obtained due to concerns for seeding of cellulitis from neck 1/15: MRI with synovitis and subcutaneous edema suggestive of cellulitis; orthopedics consulted for right ankle aspiration - aspirate with positive crystals suggestive of gout - patient started on NSAIDs and steroids 1/16 Started on dilaudid, D/C fentanyl  1/17: Patient agitated  early AM, q2h prn versed prescribed 1/18: Hb 5.2, transfused 2u pRBC; started methadone  1/19: Klonopin increased to tid and metoprolol added for SVTs 1/20: precedex gtt discontinued overnight for concerns of bradycardia   STUDIES:    CT Soft Tissue Neck W/ Contrast (1/4)  Left greater than right lower facial and infrahyoid neck subcutaneous edema and inflammatory changes extending into the left anterior chest wall. There is no collection. Possible pharyngeal and supraglottic laryngeal wall edema, which could be accentuated by artifact. There is mild thickening of the epiglottis. Airway remains patent. No evidence of peritonsillar abscess.   CT Chest W Contrast (1/4) Patchy airspace consolidations within the lingula and dependent left lower lobe, with some volume loss within the right lung base. Findings are favored to represent multifocal pneumonia.  Induration within the soft tissues at the base of the neck extending into the superior aspect of the left chest wall with associated skin thickening, which may reflect cellulitis.   CT Head (1/4) Subtle loss of gray-white differentiation in the anterior right lentiform insular ribbon. This could represent an acute/subacute infarct. Moderate generalized atrophy and diffuse white matter disease. This likely reflects the sequela of chronic microvascular ischemia.  MRI Brain WO Contrast (1/5) No acute intracranial abnormality.  CT Soft Tissue Neck wo Contrast (1/11) Persistent but slightly improved left greater than right lower facial and infrahyoid neck subcutaneous edema and presumed inflammatory changes extending into the anterior chest wall. No evidence of developing collection  CT Chest wo Contrast (1/11) No subcutaneous or soft  tissue emphysema in the chest wall to suggest necrotizing fasciitis in the chest. Stable mild skin thickening with associated subcutaneous fat stranding in the high ventral left chest wall. New small  dependent bilateral pleural effusions. New mild interlobular septal thickening. New patchy ground-glass opacities in both lungs predominantly in the upper lobes. New patchy centrilobular micronodularity in the right upper lobe. Acute anterior upper rib fractures bilaterally as detailed. No pneumothorax.  MR RIGHT ANKLE (1/14) No evidence of osteomyelitis, or soft tissue abscess. Mildly enhancing small ankle joint effusion which could be due to synovitis.Focal interstitial tear with posterior tibialis tenosynovitis. Mild flexor digitorum and flexor hallucis longus tenosynovitis.Focal complete disruption of the anterior talofibular ligament with fluid in the syndesmosis.Diffuse subcutaneous edema and cellulitis.  CULTURES:  Influenza A : Negative Influenza B : Negtive Sars Coronavirus: Negative MRSA Nasopharyngeal negative 1/4 Blood cultures: 4/4 Positive for H. Influenza  Urine culture: No growth 1/6 Blood cultures: NGTD on 1/11 1/13 Tracheal aspirate: few klebsiella pneumoniae  1/16 R ankle aspirate : monosodium urate crystals, no organisms on gram stain; culture w/ no growth to date  ANTIBIOTICS:  Bactrim 1/4 Azithromycin 1/4  Vancomycin 1/4 Zosyn  1/4 one dose Clindamycin 1/4 one dose Unasyn 1/5 --> (tentative end date 07/01/2019)  LINES/TUBES:  Tracheostomy - placed 1/4 Peripheral IV in Right Forearm - placed 1/9 Peripheral IV in Left Forearm - placed 1/9 PICC line double lumen - placed 1/15  CONSULTANTS:  Infectious Disease  ENT  Orthopedic surgery  SUBJECTIVE:  Overnight, no acute concerns. Patient tolerated trach collar ~16 hours yesterday before being placed back on full vent support for increased WOB.   CONSTITUTIONAL: BP (!) 147/71   Pulse (!) 55   Temp 98.4 F (36.9 C) (Oral)   Resp 18   Ht 5\' 8"  (1.727 m)   Wt 83.3 kg   SpO2 100%   BMI 27.92 kg/m   I/O last 3 completed shifts: In: 3515 [I.V.:358.9; NG/GT:2217; IV Piggyback:939.1] Out: [id number]; Stool:150]   Vent Mode: PRVC FiO2 (%):  [35 %-40 %] 40 % Set Rate:  [18 bmp] 18 bmp Vt Set:  [540 mL] 540 mL Plateau Pressure:  [22 [id number] cmH20] 24 cmH20   PHYSICAL EXAM: General:  Laying in bed, no acute distress, on trach collar, non diaphoretic  Neuro: Patient awake, responsive to verbal stimuli and following commands HEENT:  Trach in place with improved neck swelling, no erythema or purulence noted from trach site.  Cardiovascular:  RRR, no murmurs, rubs, or gallops Lungs:  Lungs CTAB Abdomen:  BS+, distension improved Musculoskeletal:  Right ankle edema improved Skin: Improved edema and erythema of the left side of face, neck , chest extending to mid clavicular line  ASSESSMENT AND PLAN   Severe Soft Tissue Cellulitis  Acute Airway compromise s/p emergent percutaneous tracheostomy H. Influenza Bacteremia 4/4 bottles  Cellulitis of left neck extended to anterior chest , with blood cultures growing H. Flu on 1/4 requiring tracheostomy for airway protection. Patient on Unasyn until 1/23. Leukocytosis resolved. Physical exam with improved cellulitis of left neck and chest. Will hold off on swallowing evaluation until patient can be weaned off vent ~24hrs.  Plan: - Continue Unasyn (end date 07/01/2019) - Trend vitals, CBC - Continue trach collar trials as tolerated - goal is 24 hours   Agitation Alcohol Use Disorder Patient continued on seroquel, clonidine and clonazepam. Patient started on methadone on 1/18. Gabapentin and clonazepam doses increased 1/19. Plan: - Continue methadone 5mg  tid (can uptitrate as  needed starting 1/21) - Continue seroquel, clonazepam and gabapentin - Continue clonazepam, clonidine, and gabapentin - Fentanyl prn  Intermittent SVT:  Patient with intermittent SVT's more pronounced when agitated or working with PT/OT. Patient started on metoprolol 25mg  bid on 1/20. However, patient with bradycardia overnight. Plan: - Decrease clonidine  to 0.2mg  q6h - Continue metoprolol 50mg  bid   Generalized edema: Generalized edema in setting of fluid overload as patient has been receiving IV infusions for several weeks. Patient with ~15L UOP over last 24 hrs.  This AM, patient's edema significantly improved. However, still remains in positive fluid balance.  Plan: - IV Lasix 20mg    RUE Superficial thrombophlebitis:  Patient noted to have significant RUE swelling and vascular US with superficial vein thrombosis of right cephalic vein. This morning, patient reports RUE pain. RUE with continued swelling.  Plan: - Tylenol prn - Warm compresses to RUE   R Ankle Gout:  Patient noted to have right ankle warmth, erythema and edema on examination. MRI ankle with diffuse subcutaneous edema and cellulitis with tenosynovitis. Synovial fluid shows monosodium urate crystals and WBC 3675. Gram stain w/o any organisms.  - Prednisone taper + colchicine to complete on 1/23  CAP: Multifocal pneumonia on CT Chest. Negative RVP. History of Streptococcus Intermedius empyema in 11/2018 s/p VATS CXR 1/17 with persistent prominent bilateral pulmonary infiltrates/edema. Leukocytosis improved Plan: - Continue Unasyn   Electrolyte abnormalities: Hypokalemic (K 3.4) this morning in setting of diuresis. Plan:  - Monitor and replete electrolytes as needed  Acute on chronic normocytic Anemia Hb stable this morning.  P:  - Trend Hb/Hct - Transfuse for Hb <7  Best Practice / Goals of Care / Disposition.   DVT PROPHYLAXIS: Subq Heparin SUP: Pepcid bid NUTRITION: tube feeds MOBILITY: OOB as tolerated with PT/OT Code Status: Full  FAMILY DISCUSSIONS: daily family updates  DISPOSITION : continues to require ICU level care   LABS  Glucose Recent Labs  Lab 06/28/19 0719 06/28/19 1126 06/28/19 1510 06/28/19 1923 06/28/19 2317 06/29/19 0313  GLUCAP 100* 143* 111* 100* 108* 118*    BMET Recent Labs  Lab 06/28/19 0419 06/28/19 2018  06/29/19 0411  NA 139 142 142  K 3.4* 3.6 3.4*  CL 100 99 101  CO2 32 34* 32  BUN 13 11 12   CREATININE 0.68 0.63 0.64  GLUCOSE 114* 109* 133*    Liver Enzymes Recent Labs  Lab 06/26/19 0500 06/27/19 0353 06/28/19 0419  AST 12* 14* 15  ALT 14 14 16   ALKPHOS 129* 124 125  BILITOT 0.5 0.5 0.6  ALBUMIN 1.7* 1.8* 1.8*    Electrolytes Recent Labs  Lab 06/25/19 0440 06/26/19 0500 06/28/19 0419 06/28/19 2018 06/29/19 0411  CALCIUM 8.4*   < > 8.5* 8.7* 8.7*  MG 2.3  --   --  2.0  --   PHOS 5.2*  --   --  5.1*  --    < > = values in this interval not displayed.    CBC Recent Labs  Lab 06/27/19 0353 06/28/19 0419 06/29/19 0411  WBC 8.1 9.8 6.9  HGB 9.4* 9.8* 9.5*  HCT 31.9* 32.6* 33.0*  PLT 538* 586* 560*    ABG No results for input(s): PHART, PCO2ART, PO2ART in the last 168 hours.  Coag's No results for input(s): APTT, INR in the last 168 hours.  Sepsis Markers Recent Labs  Lab 06/22/19 1333  LATICACIDVEN 0.8    Cardiac Enzymes No results for input(s): TROPONINI, PROBNP in the last 168 hours.  CRITICAL CARE  Harvie Heck, MD Internal Medicine, PGY-1 Pager # [phone] 06/29/2019  7:20 AM   Please see Attending A/P and/or Addendum for final recommendations.

## 2019-06-29 NOTE — Progress Notes (Signed)
Occupational Therapy Treatment Patient Details Name: [name]. MRN: [id number] DOB: [birth year] Today's Date: 06/29/2019    History of present illness 59 year old man with a history of alcohol use disorder, tobacco used disorder, HTN, and recent admission on 04/10/2019 for pancreatitis and hyponatremia who presents for evaluation of shortness of breath and neck swelling.    OT comments  Patient progressing slowly towards goals.  VSS during session today, HR ranged from 65-75 on trach collar 8L 35% FiO2.  He completes bed mobility with mod-max assist, total assist +2 to scoot to [facility] after EOB sitting.  Requires mod assist to maintain static sitting EOB, with R lateral and posterior lean.  Max assist for hand over hand washing face using R UE.  Remains limited by cognition, poor attention, impulsive and poor awareness; mouthing words but difficult to understand.  Placed towel roll at R side of head due to lateral positioning to promote midline posture.Will follow acutely.    Follow Up Recommendations  SNF;Supervision/Assistance - 24 hour    Equipment Recommendations  Other (comment)(TBD at next venue of care )    Recommendations for Other Services      Precautions / Restrictions Precautions Precautions: Fall Precaution Comments: trach collar, bilat mittens, posey Restrictions Weight Bearing Restrictions: No       Mobility Bed Mobility Overal bed mobility: Needs Assistance Bed Mobility: Supine to Sit;Sit to Supine     Supine to sit: Mod assist;HOB elevated Sit to supine: Max assist   General bed mobility comments: patient requires cueing to initate movement of LEs towards EOB, overall mod assist for trunk and LEs; returned to supine with support for trunk and LES  Transfers                 General transfer comment: deferred    Balance Overall balance assessment: Needs assistance Sitting-balance support: Bilateral upper extremity supported;Feet  supported Sitting balance-Leahy Scale: Poor Sitting balance - Comments: R lateral and posterior lean, able to correct with multimodal cueing but requires mod assist to maintain upright posture  Postural control: Posterior lean;Right lateral lean                                 ADL either performed or assessed with clinical judgement   ADL Overall ADL's : Needs assistance/impaired     Grooming: Wash/dry face;Maximal assistance;Bed level Grooming Details (indicate cue type and reason): hand over hand support to wash face with R UE                              Functional mobility during ADLs: Moderate assistance;Maximal assistance General ADL Comments: pt limited by cognition, weakness and balance      Vision       Perception     Praxis      Cognition Arousal/Alertness: Awake/alert Behavior During Therapy: Restless;Impulsive Overall Cognitive Status: Impaired/Different from baseline Area of Impairment: Attention;Following commands;Awareness                   Current Attention Level: Focused   Following Commands: Follows one step commands with increased time   Awareness: Intellectual   General Comments: patient reaching for lines through mits, impulsively trying to stand and "go to the bathroom", poor attention and requires redirection to tasks; pt mouthing words but difficult to understand        Exercises Exercises:  General Upper Extremity General Exercises - Upper Extremity Shoulder Flexion: AROM;Both;10 reps;Supine Shoulder Extension: AROM;Both;10 reps;Supine   Shoulder Instructions       General Comments on trach collar 8L 35%, VSS     Pertinent Vitals/ Pain       Pain Assessment: Faces Faces Pain Scale: No hurt  Home Living                                          Prior Functioning/Environment              Frequency  Min 2X/week        Progress Toward Goals  OT Goals(current goals can now  be found in the care plan section)  Progress towards OT goals: Progressing toward goals  Acute Rehab OT Goals Patient Stated Goal: unable to state  Plan Discharge plan remains appropriate;Frequency remains appropriate    Co-evaluation                 AM-PAC OT "6 Clicks" Daily Activity     Outcome Measure   Help from another person eating meals?: Total Help from another person taking care of personal grooming?: A Lot Help from another person toileting, which includes using toliet, bedpan, or urinal?: Total Help from another person bathing (including washing, rinsing, drying)?: Total Help from another person to put on and taking off regular upper body clothing?: Total Help from another person to put on and taking off regular lower body clothing?: Total 6 Click Score: 7    End of Session Equipment Utilized During Treatment: Oxygen(trach collar )  OT Visit Diagnosis: Other abnormalities of gait and mobility (R26.89);Muscle weakness (generalized) (M62.81);Other symptoms and signs involving cognitive function   Activity Tolerance Patient limited by fatigue   Patient Left in bed;with call bell/phone within reach;with bed alarm set;with restraints reapplied;with nursing/sitter in room   Nurse Communication Mobility status;Precautions        Time: [id number] OT Time Calculation (min): 28 min  Charges: OT General Charges $OT Visit: 1 Visit OT Treatments $Self Care/Home Management : 8-22 mins $Therapeutic Activity: 8-22 mins  Jolaine Artist, OT Acute Rehabilitation Services Pager [phone] Office [phone]    [name] 06/29/2019, 1:05 PM

## 2019-06-30 LAB — BASIC METABOLIC PANEL
Anion gap: 8 (ref 5–15)
BUN: 10 mg/dL (ref 6–20)
CO2: 28 mmol/L (ref 22–32)
Calcium: 7.6 mg/dL — ABNORMAL LOW (ref 8.9–10.3)
Chloride: 107 mmol/L (ref 98–111)
Creatinine, Ser: 0.63 mg/dL (ref 0.61–1.24)
GFR calc Af Amer: >60 mL/min (ref >60)
GFR calc non Af Amer: >60 mL/min (ref >60)
Glucose, Bld: 86 mg/dL (ref 70–99)
Potassium: 3.3 mmol/L — ABNORMAL LOW (ref 3.5–5.1)
Sodium: 143 mmol/L (ref 135–145)

## 2019-06-30 LAB — GLUCOSE, CAPILLARY
Glucose-Capillary: 83 mg/dL (ref 70–99)
Glucose-Capillary: 104 mg/dL — ABNORMAL HIGH (ref 70–99)
Glucose-Capillary: 125 mg/dL — ABNORMAL HIGH (ref 70–99)
Glucose-Capillary: 120 mg/dL — ABNORMAL HIGH (ref 70–99)
Glucose-Capillary: 112 mg/dL — ABNORMAL HIGH (ref 70–99)
Glucose-Capillary: 120 mg/dL — ABNORMAL HIGH (ref 70–99)

## 2019-06-30 LAB — CBC
HCT: 27.7 % — ABNORMAL LOW (ref 39.0–52.0)
Hemoglobin: 8 g/dL — ABNORMAL LOW (ref 13.0–17.0)
MCH: 24.8 pg — ABNORMAL LOW (ref 26.0–34.0)
MCHC: 28.9 g/dL — ABNORMAL LOW (ref 30.0–36.0)
MCV: 86 fL (ref 80.0–100.0)
Platelets: 455 10*3/uL — ABNORMAL HIGH (ref 150–400)
RBC: 3.22 MIL/uL — ABNORMAL LOW (ref 4.22–5.81)
RDW: 18.8 % — ABNORMAL HIGH (ref 11.5–15.5)
WBC: 6.5 10*3/uL (ref 4.0–10.5)
nRBC: 0 % (ref 0.0–0.2)

## 2019-06-30 MED ORDER — FUROSEMIDE 10 MG/ML IJ SOLN
40.0000 mg | Freq: Once | INTRAMUSCULAR | Status: AC
  Administered 2019-06-30: 10:00:00 40 mg via INTRAVENOUS
  Filled 2019-06-30: qty 4

## 2019-06-30 MED ORDER — CLONIDINE HCL 0.2 MG PO TABS
0.1000 mg | ORAL_TABLET | Freq: Three times a day (TID) | ORAL | Status: AC
  Administered 2019-07-01 – 2019-07-02 (×3): 0.1 mg
  Filled 2019-06-30 (×3): qty 1

## 2019-06-30 MED ORDER — CLONIDINE HCL 0.2 MG PO TABS: 0.1000 mg | ORAL_TABLET | Freq: Two times a day (BID) | ORAL | Status: DC

## 2019-06-30 MED ORDER — METHADONE HCL 10 MG PO TABS
10.0000 mg | ORAL_TABLET | Freq: Three times a day (TID) | ORAL | Status: DC
  Administered 2019-06-30 – 2019-07-02 (×8): 10 mg via ORAL
  Filled 2019-06-30 (×9): qty 1

## 2019-06-30 MED ORDER — CLONIDINE HCL 0.1 MG PO TABS
0.1000 mg | ORAL_TABLET | ORAL | Status: AC
  Administered 2019-07-03: 0.1 mg
  Filled 2019-06-30: qty 1

## 2019-06-30 MED ORDER — CLONIDINE HCL 0.2 MG PO TABS: 0.1000 mg | ORAL_TABLET | ORAL | Status: DC

## 2019-06-30 MED ORDER — CLONIDINE HCL 0.2 MG PO TABS
0.1000 mg | ORAL_TABLET | Freq: Four times a day (QID) | ORAL | Status: AC
  Administered 2019-06-30 – 2019-07-01 (×3): 0.1 mg
  Filled 2019-06-30 (×4): qty 1

## 2019-06-30 MED ORDER — METOPROLOL TARTRATE 25 MG/10 ML ORAL SUSPENSION
50.0000 mg | Freq: Two times a day (BID) | ORAL | Status: DC
  Administered 2019-06-30 – 2019-07-07 (×13): 50 mg
  Filled 2019-06-30 (×17): qty 20

## 2019-06-30 MED ORDER — CLONIDINE HCL 0.2 MG PO TABS: 0.1000 mg | ORAL_TABLET | Freq: Four times a day (QID) | ORAL | Status: DC

## 2019-06-30 MED ORDER — CLONIDINE HCL 0.1 MG PO TABS
0.1000 mg | ORAL_TABLET | Freq: Two times a day (BID) | ORAL | Status: AC
  Administered 2019-07-02: 0.1 mg
  Filled 2019-06-30 (×2): qty 1

## 2019-06-30 MED ORDER — CLONIDINE HCL 0.2 MG PO TABS: 0.1000 mg | ORAL_TABLET | Freq: Three times a day (TID) | ORAL | Status: DC

## 2019-06-30 MED ORDER — POTASSIUM CHLORIDE 20 MEQ/15ML (10%) PO SOLN
40.0000 meq | Freq: Two times a day (BID) | ORAL | Status: AC
  Administered 2019-06-30 (×2): 40 meq via ORAL
  Filled 2019-06-30 (×2): qty 30

## 2019-06-30 NOTE — Progress Notes (Signed)
PULMONARY / CRITICAL CARE MEDICINE  NAME:  William Vermeer., MRN:  [id number], DOB:  [birth year], LOS: 40 ADMISSION DATE:  06/12/2019, CONSULTATION DATE:  06/12/2019 REFERRING MD:  Alinda Sierras, CHIEF COMPLAINT:  Shortness of breath, sore throat, and neck swelling  BRIEF HISTORY:    59 year old man with a history of alcohol use disorder, tobacco used disorder, HTN, and recent admission on 04/10/2019 for pancreatitis and hyponatremia who presents for evaluation of shortness of breath and neck swelling. Reported  to have started 5 days ago and progressively gotten worse. Patient tachycardic to 160's and tachypnic to the 40's in the ED. Seen in ED, patient says he has neck swelling , cough, and difficulty breathing. Admitted to ICU on 1/4 with loss of airway and PEA arrest requiring emergent tracheostomy. Patient has been difficult to wean off sedation   SIGNIFICANT PAST MEDICAL HISTORY    Past Medical History:  Diagnosis Date  . Alcohol abuse   . Alcoholic ([place])   . Back pain   . CAP (community acquired pneumonia) 11/26/2018  . COPD (chronic obstructive pulmonary disease) ([place])   . Empyema of right pleural space ([place]) 11/26/2018  . Heavy smoker   . Hep C w/ coma, chronic ([place])   . Loose, teeth   . Pancreatitis   . Poor dentition   . Tobacco abuse    SIGNIFICANT EVENTS:  Admit to ICU on 1/4  1/4 : Lost airway and PEA arrest on, requiring emergent tracheostomy and ROSC obtained 1/5: 4/4 Blood cultures growing H.Influenza , narrowed to Unasyn 1/5 > 1/14: Continued on antibiotics without any significant improvement  1/14: New right foot swelling for which MRI obtained due to concerns for seeding of cellulitis from neck 1/15: MRI with synovitis and subcutaneous edema suggestive of cellulitis; orthopedics consulted for right ankle aspiration - aspirate with positive crystals suggestive of gout - patient started on NSAIDs and steroids 1/16 Started on dilaudid, D/C fentanyl  1/17: Patient agitated  early AM, q2h prn versed prescribed 1/18: Hb 5.2, transfused 2u pRBC; started methadone  1/19: Klonopin increased to tid and metoprolol added for SVTs 1/20: precedex gtt discontinued overnight for concerns of bradycardia   STUDIES:    CT Soft Tissue Neck W/ Contrast (1/4)  Left greater than right lower facial and infrahyoid neck subcutaneous edema and inflammatory changes extending into the left anterior chest wall. There is no collection. Possible pharyngeal and supraglottic laryngeal wall edema, which could be accentuated by artifact. There is mild thickening of the epiglottis. Airway remains patent. No evidence of peritonsillar abscess.   CT Chest W Contrast (1/4) Patchy airspace consolidations within the lingula and dependent left lower lobe, with some volume loss within the right lung base. Findings are favored to represent multifocal pneumonia.  Induration within the soft tissues at the base of the neck extending into the superior aspect of the left chest wall with associated skin thickening, which may reflect cellulitis.   CT Head (1/4) Subtle loss of gray-white differentiation in the anterior right lentiform insular ribbon. This could represent an acute/subacute infarct. Moderate generalized atrophy and diffuse white matter disease. This likely reflects the sequela of chronic microvascular ischemia.  MRI Brain WO Contrast (1/5) No acute intracranial abnormality.  CT Soft Tissue Neck wo Contrast (1/11) Persistent but slightly improved left greater than right lower facial and infrahyoid neck subcutaneous edema and presumed inflammatory changes extending into the anterior chest wall. No evidence of developing collection  CT Chest wo Contrast (1/11) No subcutaneous or soft  tissue emphysema in the chest wall to suggest necrotizing fasciitis in the chest. Stable mild skin thickening with associated subcutaneous fat stranding in the high ventral left chest wall. New small  dependent bilateral pleural effusions. New mild interlobular septal thickening. New patchy ground-glass opacities in both lungs predominantly in the upper lobes. New patchy centrilobular micronodularity in the right upper lobe. Acute anterior upper rib fractures bilaterally as detailed. No pneumothorax.  MR RIGHT ANKLE (1/14) No evidence of osteomyelitis, or soft tissue abscess. Mildly enhancing small ankle joint effusion which could be due to synovitis.Focal interstitial tear with posterior tibialis tenosynovitis. Mild flexor digitorum and flexor hallucis longus tenosynovitis.Focal complete disruption of the anterior talofibular ligament with fluid in the syndesmosis.Diffuse subcutaneous edema and cellulitis.  CULTURES:  Influenza A : Negative Influenza B : Negtive Sars Coronavirus: Negative MRSA Nasopharyngeal negative 1/4 Blood cultures: 4/4 Positive for H. Influenza  Urine culture: No growth 1/6 Blood cultures: NGTD on 1/11 1/13 Tracheal aspirate: few klebsiella pneumoniae  1/16 R ankle aspirate : monosodium urate crystals, no organisms on gram stain; culture w/ no growth to date  ANTIBIOTICS:  Bactrim 1/4 Azithromycin 1/4  Vancomycin 1/4 Zosyn  1/4 one dose Clindamycin 1/4 one dose Unasyn 1/5 --> (tentative end date 07/01/2019)  LINES/TUBES:  Tracheostomy - placed 1/4 Peripheral IV in Right Forearm - placed 1/9 Peripheral IV in Left Forearm - placed 1/9 PICC line double lumen - placed 1/15  CONSULTANTS:  Infectious Disease  ENT  Orthopedic surgery  SUBJECTIVE:  Overnight no acute issues.  He has been trach collar for 24 hours.  Edema is reducing.  He still requires as needed fentanyl with concerns for chronic back pain that makes lying in bed uncomfortable.  CONSTITUTIONAL: BP (!) 168/93   Pulse 88   Temp 98.2 F (36.8 C) (Oral)   Resp (!) 30   Ht 5\' 8"  (1.727 m)   Wt 79.3 kg   SpO2 99%   BMI 26.58 kg/m   I/O last 3 completed shifts: In: 3071.1  [I.V.:326.1; NG/GT:2145; IV Piggyback:600] Out: 6505 [Urine:6505]   FiO2 (%):  [35 %] 35 %   PHYSICAL EXAM: General:  Laying in bed, no acute distress, on trach collar, non diaphoretic  Neuro: Patient awake, responsive to verbal stimuli and following commands HEENT:  Trach in place with improved neck swelling, no erythema or purulence noted from trach site.  Cardiovascular:  RRR, no murmurs, rubs, or gallops Lungs:  Lungs CTAB Abdomen:  BS+, distension improved Musculoskeletal:  Right ankle edema improved Skin: Improved edema and erythema of the left side of face, neck , chest extending to mid clavicular line GU: Foley in place, scrotal edema  ASSESSMENT AND PLAN   Severe Soft Tissue Cellulitis  Acute Airway compromise s/p emergent percutaneous tracheostomy H. Influenza Bacteremia 4/4 bottles  Cellulitis of left neck extended to anterior chest , with blood cultures growing H. Flu on 1/4 requiring tracheostomy for airway protection. Patient on Unasyn until 1/23 - Continue Unasyn (end date 07/01/2019) - Trend vitals, CBC - Continue trach collar trials as tolerated -he has been on trach collar for 24 hours, since the morning of 1/22.  Goal is for 72 hours to secure ventilator liberation.  Agitation Alcohol Use Disorder Patient continued on seroquel, clonidine and clonazepam. Patient started on methadone on 1/18. Gabapentin and clonazepam doses increased 1/19. Plan: -Crease methadone to 10 mg 3 times daily today.  This should not be uptitrated for 72 hours.  His QTC is within the normal range. -  Continue seroquel, clonazepam and gabapentin - Fentanyl prn  Intermittent SVT:  Patient with intermittent SVT's more pronounced when agitated or working with PT/OT.  Plan: -Clonidine taper has been ordered -Increase metoprolol to combat the SVT  Generalized edema/anasarca: Generalized edema in setting of fluid overload as patient has been receiving IV infusions for several weeks. Patient  with ~15L UOP over last 24 hrs.  This AM, patient's edema significantly improved. However, still remains in positive fluid balance.  Plan: Diurese with IV Lasix  RUE Superficial thrombophlebitis:  Patient noted to have significant RUE swelling and vascular US with superficial vein thrombosis of right cephalic vein. This morning, patient reports RUE pain. RUE with continued swelling.  Plan: - Tylenol prn - Warm compresses to RUE   R Ankle Gout:  Patient noted to have right ankle warmth, erythema and edema on examination. MRI ankle with diffuse subcutaneous edema and cellulitis with tenosynovitis. Synovial fluid shows monosodium urate crystals and WBC 3675. Gram stain w/o any organisms.  - Prednisone taper + colchicine to complete on 1/23  CAP: Multifocal pneumonia on CT Chest. Negative RVP. History of Streptococcus Intermedius empyema in 11/2018 s/p VATS CXR 1/17 with persistent prominent bilateral pulmonary infiltrates/edema. Leukocytosis improved Plan: - Continue Unasyn  Electrolyte abnormalities: Hypokalemic (K 3.4) this morning in setting of diuresis. Plan:  - Monitor and replete electrolytes as needed.  Today he will get 80 meq of potassium  Best Practice / Goals of Care / Disposition.   DVT PROPHYLAXIS: Subq Heparin SUP: Pepcid bid NUTRITION: tube feeds MOBILITY: OOB as tolerated with PT/OT Code Status: Full  FAMILY DISCUSSIONS: Patient has a partner who comes for bedside updates DISPOSITION : continues to require ICU level care.  Can transfer to PCU once he is tolerating trach collar for 72 hours  LABS  Glucose Recent Labs  Lab 06/29/19 1119 06/29/19 1516 06/29/19 1920 06/29/19 2313 06/30/19 0351 06/30/19 0856  GLUCAP 126* 115* 107* 98 83 104*    BMET Recent Labs  Lab 06/28/19 2018 06/29/19 0411 06/30/19 0318  NA 142 142 143  K 3.6 3.4* 3.3*  CL 99 101 107  CO2 34* 32 28  BUN 11 12 10   CREATININE 0.63 0.64 0.63  GLUCOSE 109* 133* 86    Liver  Enzymes Recent Labs  Lab 06/26/19 0500 06/27/19 0353 06/28/19 0419  AST 12* 14* 15  ALT 14 14 16   ALKPHOS 129* 124 125  BILITOT 0.5 0.5 0.6  ALBUMIN 1.7* 1.8* 1.8*    Electrolytes Recent Labs  Lab 06/25/19 0440 06/26/19 0500 06/28/19 2018 06/29/19 0411 06/30/19 0318  CALCIUM 8.4*   < > 8.7* 8.7* 7.6*  MG 2.3  --  2.0  --   --   PHOS 5.2*  --  5.1*  --   --    < > = values in this interval not displayed.    CBC Recent Labs  Lab 06/28/19 0419 06/29/19 0411 06/30/19 0318  WBC 9.8 6.9 6.5  HGB 9.8* 9.5* 8.0*  HCT 32.6* 33.0* 27.7*  PLT 586* 560* 455*    ABG No results for input(s): PHART, PCO2ART, PO2ART in the last 168 hours.  Coag's No results for input(s): APTT, INR in the last 168 hours.  Sepsis Markers No results for input(s): LATICACIDVEN, PROCALCITON, O2SATVEN in the last 168 hours.  Cardiac Enzymes No results for input(s): TROPONINI, PROBNP in the last 168 hours.

## 2019-07-01 LAB — BASIC METABOLIC PANEL
Anion gap: 10 (ref 5–15)
BUN: 16 mg/dL (ref 6–20)
CO2: 31 mmol/L (ref 22–32)
Calcium: 8.9 mg/dL (ref 8.9–10.3)
Chloride: 100 mmol/L (ref 98–111)
Creatinine, Ser: 0.68 mg/dL (ref 0.61–1.24)
GFR calc Af Amer: >60 mL/min (ref >60)
GFR calc non Af Amer: >60 mL/min (ref >60)
Glucose, Bld: 111 mg/dL — ABNORMAL HIGH (ref 70–99)
Potassium: 4 mmol/L (ref 3.5–5.1)
Sodium: 141 mmol/L (ref 135–145)

## 2019-07-01 LAB — CBC
HCT: 37.7 % — ABNORMAL LOW (ref 39.0–52.0)
Hemoglobin: 10.9 g/dL — ABNORMAL LOW (ref 13.0–17.0)
MCH: 24.8 pg — ABNORMAL LOW (ref 26.0–34.0)
MCHC: 28.9 g/dL — ABNORMAL LOW (ref 30.0–36.0)
MCV: 85.9 fL (ref 80.0–100.0)
Platelets: 573 10*3/uL — ABNORMAL HIGH (ref 150–400)
RBC: 4.39 MIL/uL (ref 4.22–5.81)
RDW: 19.1 % — ABNORMAL HIGH (ref 11.5–15.5)
WBC: 7.7 10*3/uL (ref 4.0–10.5)
nRBC: 0 % (ref 0.0–0.2)

## 2019-07-01 LAB — MAGNESIUM: Magnesium: 2 mg/dL (ref 1.7–2.4)

## 2019-07-01 LAB — GLUCOSE, CAPILLARY
Glucose-Capillary: 103 mg/dL — ABNORMAL HIGH (ref 70–99)
Glucose-Capillary: 109 mg/dL — ABNORMAL HIGH (ref 70–99)
Glucose-Capillary: 110 mg/dL — ABNORMAL HIGH (ref 70–99)
Glucose-Capillary: 111 mg/dL — ABNORMAL HIGH (ref 70–99)
Glucose-Capillary: 118 mg/dL — ABNORMAL HIGH (ref 70–99)
Glucose-Capillary: 92 mg/dL (ref 70–99)

## 2019-07-01 MED ORDER — FUROSEMIDE 40 MG PO TABS
20.0000 mg | ORAL_TABLET | Freq: Once | ORAL | Status: AC
  Administered 2019-07-01: 11:00:00 20 mg
  Filled 2019-07-01: qty 1

## 2019-07-01 MED ORDER — ORAL CARE MOUTH RINSE
15.0000 mL | Freq: Two times a day (BID) | OROMUCOSAL | Status: DC
  Administered 2019-07-01 – 2019-07-08 (×13): 15 mL via OROMUCOSAL

## 2019-07-01 MED ORDER — POLYETHYLENE GLYCOL 3350 17 G PO PACK: 17.0000 g | PACK | Freq: Every day | ORAL | Status: DC

## 2019-07-01 MED ORDER — CHLORHEXIDINE GLUCONATE 0.12 % MT SOLN
15.0000 mL | Freq: Two times a day (BID) | OROMUCOSAL | Status: DC
  Administered 2019-07-01 – 2019-07-08 (×16): 15 mL via OROMUCOSAL
  Filled 2019-07-01 (×15): qty 15

## 2019-07-01 MED ORDER — CLONAZEPAM 0.25 MG PO TBDP
2.0000 mg | ORAL_TABLET | Freq: Three times a day (TID) | ORAL | Status: DC
  Administered 2019-07-01 – 2019-07-03 (×6): 2 mg
  Filled 2019-07-01: qty 8
  Filled 2019-07-01 (×2): qty 4
  Filled 2019-07-01: qty 16
  Filled 2019-07-01 (×2): qty 4

## 2019-07-01 NOTE — Progress Notes (Signed)
PULMONARY / CRITICAL CARE MEDICINE  NAME:  Mc Fecko., MRN:  [id number], DOB:  [birth year], LOS: 74 ADMISSION DATE:  06/12/2019, CONSULTATION DATE:  06/12/2019 REFERRING MD:  Alinda Sierras, CHIEF COMPLAINT:  Shortness of breath, sore throat, and neck swelling  BRIEF HISTORY:    59 year old man with a history of alcohol use disorder, tobacco used disorder, HTN, and recent admission on 04/10/2019 for pancreatitis and hyponatremia who presents for evaluation of shortness of breath and neck swelling. Reported  to have started 5 days ago and progressively gotten worse. Patient tachycardic to 160's and tachypnic to the 40's in the ED. Seen in ED, patient says he has neck swelling , cough, and difficulty breathing. Admitted to ICU on 1/4 with loss of airway and PEA arrest requiring emergent tracheostomy. Patient has been difficult to wean off sedation   SIGNIFICANT PAST MEDICAL HISTORY    Past Medical History:  Diagnosis Date  . Alcohol abuse   . Alcoholic ([place])   . Back pain   . CAP (community acquired pneumonia) 11/26/2018  . COPD (chronic obstructive pulmonary disease) ([place])   . Empyema of right pleural space ([place]) 11/26/2018  . Heavy smoker   . Hep C w/ coma, chronic ([place])   . Loose, teeth   . Pancreatitis   . Poor dentition   . Tobacco abuse    SIGNIFICANT EVENTS:  Admit to ICU on 1/4  1/4 : Lost airway and PEA arrest on, requiring emergent tracheostomy and ROSC obtained 1/5: 4/4 Blood cultures growing H.Influenza , narrowed to Unasyn 1/5 > 1/14: Continued on antibiotics without any significant improvement  1/14: New right foot swelling for which MRI obtained due to concerns for seeding of cellulitis from neck 1/15: MRI with synovitis and subcutaneous edema suggestive of cellulitis; orthopedics consulted for right ankle aspiration - aspirate with positive crystals suggestive of gout - patient started on NSAIDs and steroids 1/16 Started on dilaudid, D/C fentanyl  1/17: Patient agitated  early AM, q2h prn versed prescribed 1/18: Hb 5.2, transfused 2u pRBC; started methadone  1/19: Klonopin increased to tid and metoprolol added for SVTs 1/20: precedex gtt discontinued overnight for concerns of bradycardia  1/21: clonidine taper, 24hr trach collar 1/22: continuing trach collar 1/23: last day of unasyn for cellulitis, steroid taper and colchicine for gout, continuing trach collar (goal 72 hours)  STUDIES:    CT Soft Tissue Neck W/ Contrast (1/4)  Left greater than right lower facial and infrahyoid neck subcutaneous edema and inflammatory changes extending into the left anterior chest wall. There is no collection. Possible pharyngeal and supraglottic laryngeal wall edema, which could be accentuated by artifact. There is mild thickening of the epiglottis. Airway remains patent. No evidence of peritonsillar abscess.   CT Chest W Contrast (1/4) Patchy airspace consolidations within the lingula and dependent left lower lobe, with some volume loss within the right lung base. Findings are favored to represent multifocal pneumonia.  Induration within the soft tissues at the base of the neck extending into the superior aspect of the left chest wall with associated skin thickening, which may reflect cellulitis.   CT Head (1/4) Subtle loss of gray-white differentiation in the anterior right lentiform insular ribbon. This could represent an acute/subacute infarct. Moderate generalized atrophy and diffuse white matter disease. This likely reflects the sequela of chronic microvascular ischemia.  MRI Brain WO Contrast (1/5) No acute intracranial abnormality.  CT Soft Tissue Neck wo Contrast (1/11) Persistent but slightly improved left greater than right lower facial and  infrahyoid neck subcutaneous edema and presumed inflammatory changes extending into the anterior chest wall. No evidence of developing collection  CT Chest wo Contrast (1/11) No subcutaneous or soft tissue emphysema  in the chest wall to suggest necrotizing fasciitis in the chest. Stable mild skin thickening with associated subcutaneous fat stranding in the high ventral left chest wall. New small dependent bilateral pleural effusions. New mild interlobular septal thickening. New patchy ground-glass opacities in both lungs predominantly in the upper lobes. New patchy centrilobular micronodularity in the right upper lobe. Acute anterior upper rib fractures bilaterally as detailed. No pneumothorax.  MR RIGHT ANKLE (1/14) No evidence of osteomyelitis, or soft tissue abscess. Mildly enhancing small ankle joint effusion which could be due to synovitis.Focal interstitial tear with posterior tibialis tenosynovitis. Mild flexor digitorum and flexor hallucis longus tenosynovitis.Focal complete disruption of the anterior talofibular ligament with fluid in the syndesmosis.Diffuse subcutaneous edema and cellulitis.  CULTURES:  Influenza A : Negative Influenza B : Negtive Sars Coronavirus: Negative MRSA Nasopharyngeal negative 1/4 Blood cultures: 4/4 Positive for H. Influenza  Urine culture: No growth 1/6 Blood cultures: NGTD on 1/11 1/13 Tracheal aspirate: few klebsiella pneumoniae  1/16 R ankle aspirate : monosodium urate crystals, no organisms on gram stain; culture w/ no growth to date  ANTIBIOTICS:  Bactrim 1/4 Azithromycin 1/4  Vancomycin 1/4 Zosyn  1/4 one dose Clindamycin 1/4 one dose Unasyn 1/5 --> (tentative end date 07/01/2019)  LINES/TUBES:  Tracheostomy - placed 1/4 Peripheral IV in Right Forearm - placed 1/9 Peripheral IV in Left Forearm - placed 1/9 PICC line double lumen - placed 1/15  CONSULTANTS:  Infectious Disease  ENT  Orthopedic surgery  SUBJECTIVE:  Overnight no acute issues.  He has been trach collar for 24 hours.  Edema is reducing.  He still requires as needed fentanyl with concerns for chronic back pain that makes lying in bed uncomfortable.  CONSTITUTIONAL: BP 137/84    Pulse 81   Temp 97.9 F (36.6 C) (Oral)   Resp (!) 27   Ht 5\' 8"  (1.727 m)   Wt 75.1 kg   SpO2 98%   BMI 25.17 kg/m   I/O last 3 completed shifts: In: 3200.5 [I.V.:325.2; NG/GT:2275; IV Piggyback:600.3] Out: 6600 [Urine:6600]   FiO2 (%):  [35 %] 35 %   PHYSICAL EXAM: General:  Laying in bed, no acute distress, on trach collar, non diaphoretic  Neuro: Patient awake, responsive to verbal stimuli and following commands HEENT:  Trach in place with improved neck swelling, no erythema or purulence noted from trach site.  Cardiovascular:  RRR, no murmurs, rubs, or gallops Lungs:  Lungs CTAB Abdomen:  BS+, distension improved Musculoskeletal:  Right ankle edema improved Skin: Improved edema and erythema of the left side of face, neck , chest extending to mid clavicular line GU: Foley in place, scrotal edema  ASSESSMENT AND PLAN   Severe Soft Tissue Cellulitis  Acute Airway compromise s/p emergent percutaneous tracheostomy H. Influenza Bacteremia 4/4 bottles  Cellulitis of left neck extended to anterior chest , with blood cultures growing H. Flu on 1/4 requiring tracheostomy for airway protection. Patient on Unasyn until 1/23 - Continue Unasyn (end date 07/01/2019) - Trend vitals, CBC - Continue trach collar trials as tolerated -he has been on trach collar for 48 hours, since the morning of 1/21.  Goal is for 72 hours to secure ventilator liberation.  Agitation Alcohol Use Disorder Patient continued on seroquel, clonidine and clonazepam. Patient started on methadone on 1/18. Gabapentin and clonazepam doses increased  1/19. Methadone increased on 1/23. QTc wnl.  Plan: -Continue methadone 10 mg tid (can uptitrate on 1/26) - Continue seroquel, clonazepam and gabapentin - Fentanyl prn  Intermittent SVT:  Patient with intermittent SVT's more pronounced when agitated or working with PT/OT.  Plan: -Clonidine taper has been ordered -Increase metoprolol to combat the SVT  Generalized  edema/anasarca: Generalized edema in setting of fluid overload as patient has been receiving IV infusions for several weeks. Patient with ~5.5l UOP over past 24hrs This AM, patient's edema significantly improved.  Plan: Continuing gentle diuresis   RUE Superficial thrombophlebitis:  Patient noted to have significant RUE swelling and vascular US with superficial vein thrombosis of right cephalic vein. This morning, patient reports RUE pain. RUE with continued swelling.  Plan: - Tylenol prn - Warm compresses to RUE   R Ankle Gout:  Patient noted to have right ankle warmth, erythema and edema on examination. MRI ankle with diffuse subcutaneous edema and cellulitis with tenosynovitis. Synovial fluid shows monosodium urate crystals and WBC 3675. Gram stain w/o any organisms.  - Prednisone taper + colchicine to complete today (1/23)  CAP: Multifocal pneumonia on CT Chest. Negative RVP. History of Streptococcus Intermedius empyema in 11/2018 s/p VATS CXR 1/17 with persistent prominent bilateral pulmonary infiltrates/edema. Leukocytosis improved Plan: - Continue Unasyn  Electrolyte abnormalities: Plan:  - Monitor and replete electrolytes as needed.    Best Practice / Goals of Care / Disposition.   DVT PROPHYLAXIS: Subq Heparin SUP: Pepcid bid NUTRITION: tube feeds MOBILITY: OOB as tolerated with PT/OT Code Status: Full  FAMILY DISCUSSIONS: Patient has a partner who comes for bedside updates DISPOSITION : continues to require ICU level care. Can transfer to PCU once he is tolerating trach collar for 72 hours  LABS  Glucose Recent Labs  Lab 06/30/19 0856 06/30/19 1218 06/30/19 1618 06/30/19 1943 06/30/19 2323 07/01/19 0412  GLUCAP 104* 125* 120* 112* 120* 109*    BMET Recent Labs  Lab 06/29/19 0411 06/30/19 0318 07/01/19 0514  NA 142 143 141  K 3.4* 3.3* 4.0  CL 101 107 100  CO2 32 28 31  BUN 12 10 16   CREATININE 0.64 0.63 0.68  GLUCOSE 133* 86 111*    Liver  Enzymes Recent Labs  Lab 06/26/19 0500 06/27/19 0353 06/28/19 0419  AST 12* 14* 15  ALT 14 14 16   ALKPHOS 129* 124 125  BILITOT 0.5 0.5 0.6  ALBUMIN 1.7* 1.8* 1.8*    Electrolytes Recent Labs  Lab 06/25/19 0440 06/26/19 0500 06/28/19 2018 06/28/19 2018 06/29/19 0411 06/30/19 0318 07/01/19 0514  CALCIUM 8.4*   < > 8.7*   < > 8.7* 7.6* 8.9  MG 2.3  --  2.0  --   --   --  2.0  PHOS 5.2*  --  5.1*  --   --   --   --    < > = values in this interval not displayed.    CBC Recent Labs  Lab 06/29/19 0411 06/30/19 0318 07/01/19 0514  WBC 6.9 6.5 7.7  HGB 9.5* 8.0* 10.9*  HCT 33.0* 27.7* 37.7*  PLT 560* 455* 573*    ABG No results for input(s): PHART, PCO2ART, PO2ART in the last 168 hours.  Coag's No results for input(s): APTT, INR in the last 168 hours.  Sepsis Markers No results for input(s): LATICACIDVEN, PROCALCITON, O2SATVEN in the last 168 hours.  Cardiac Enzymes No results for input(s): TROPONINI, PROBNP in the last 168 hours.    CRITICAL CARE: [facility]  Marva Panda, MD Internal Medicine, PGY-1 Pager # [phone] 07/01/2019  7:02 AM

## 2019-07-02 LAB — CBC
HCT: 38.9 % — ABNORMAL LOW (ref 39.0–52.0)
Hemoglobin: 11.3 g/dL — ABNORMAL LOW (ref 13.0–17.0)
MCH: 24.8 pg — ABNORMAL LOW (ref 26.0–34.0)
MCHC: 29 g/dL — ABNORMAL LOW (ref 30.0–36.0)
MCV: 85.5 fL (ref 80.0–100.0)
Platelets: 521 10*3/uL — ABNORMAL HIGH (ref 150–400)
RBC: 4.55 MIL/uL (ref 4.22–5.81)
RDW: 18.8 % — ABNORMAL HIGH (ref 11.5–15.5)
WBC: 7.5 10*3/uL (ref 4.0–10.5)
nRBC: 0 % (ref 0.0–0.2)

## 2019-07-02 LAB — BASIC METABOLIC PANEL
Anion gap: 11 (ref 5–15)
BUN: 16 mg/dL (ref 6–20)
CO2: 31 mmol/L (ref 22–32)
Calcium: 9.2 mg/dL (ref 8.9–10.3)
Chloride: 97 mmol/L — ABNORMAL LOW (ref 98–111)
Creatinine, Ser: 0.59 mg/dL — ABNORMAL LOW (ref 0.61–1.24)
GFR calc Af Amer: >60 mL/min (ref >60)
GFR calc non Af Amer: >60 mL/min (ref >60)
Glucose, Bld: 103 mg/dL — ABNORMAL HIGH (ref 70–99)
Potassium: 4.1 mmol/L (ref 3.5–5.1)
Sodium: 139 mmol/L (ref 135–145)

## 2019-07-02 LAB — GLUCOSE, CAPILLARY
Glucose-Capillary: 93 mg/dL (ref 70–99)
Glucose-Capillary: 84 mg/dL (ref 70–99)
Glucose-Capillary: 100 mg/dL — ABNORMAL HIGH (ref 70–99)
Glucose-Capillary: 109 mg/dL — ABNORMAL HIGH (ref 70–99)
Glucose-Capillary: 97 mg/dL (ref 70–99)

## 2019-07-02 MED ORDER — METHADONE HCL 10 MG PO TABS
10.0000 mg | ORAL_TABLET | Freq: Three times a day (TID) | ORAL | Status: DC
  Administered 2019-07-02 – 2019-07-08 (×11): 10 mg
  Filled 2019-07-02 (×10): qty 1

## 2019-07-02 MED ORDER — POLYETHYLENE GLYCOL 3350 17 G PO PACK
17.0000 g | PACK | Freq: Every day | ORAL | Status: DC
  Administered 2019-07-03 – 2019-07-27 (×18): 17 g
  Filled 2019-07-02 (×18): qty 1

## 2019-07-02 NOTE — Progress Notes (Signed)
Noted sats with trach collar decreased to 87 - 89% on 5L/28%. Attempted to suction trach but no secretions removed. Pt cont to clear secretions with cough. RT notified of decreased sats from 87 - 92%. RT to come see pt.

## 2019-07-02 NOTE — Progress Notes (Signed)
Received to 6E21 via pt bed from 2MW. Trach collar intact with O2 @ 5 L (28% FiO2). Sats mid 90's. Thick blood tinged secretions noted. Pt able to clear with strong cough. Cardiac Monitor verified with telemetry. Bilateral wrist restraints secured, Posey belt secured to bed rail . TF resumed at 65 ml/hr via cortrak. Rt upper arm dbl lumen with drsg CD & I. Bed alarm set.

## 2019-07-02 NOTE — Progress Notes (Signed)
Informed [name] that patient was transferred to  [facility] room 21.  All questions answered.  Irven [place], RN

## 2019-07-02 NOTE — Progress Notes (Signed)
PULMONARY / CRITICAL CARE MEDICINE  NAME:  William Vielman., MRN:  [id number], DOB:  [birth year], LOS: 81 ADMISSION DATE:  06/12/2019, CONSULTATION DATE:  06/12/2019 REFERRING MD:  Alinda Sierras, CHIEF COMPLAINT:  Shortness of breath, sore throat, and neck swelling  BRIEF HISTORY:    59 year old man with a history of alcohol use disorder, tobacco used disorder, HTN, and recent admission on 04/10/2019 for pancreatitis and hyponatremia who presents for evaluation of shortness of breath and neck swelling. Reported  to have started 5 days ago and progressively gotten worse. Patient tachycardic to 160's and tachypnic to the 40's in the ED. Seen in ED, patient says he has neck swelling , cough, and difficulty breathing. Admitted to ICU on 1/4 with loss of airway and PEA arrest requiring emergent tracheostomy. Patient has been difficult to wean off sedation   SIGNIFICANT PAST MEDICAL HISTORY    Past Medical History:  Diagnosis Date  . Alcohol abuse   . Alcoholic ([place])   . Back pain   . CAP (community acquired pneumonia) 11/26/2018  . COPD (chronic obstructive pulmonary disease) ([place])   . Empyema of right pleural space ([place]) 11/26/2018  . Heavy smoker   . Hep C w/ coma, chronic ([place])   . Loose, teeth   . Pancreatitis   . Poor dentition   . Tobacco abuse    SIGNIFICANT EVENTS:  Admit to ICU on 1/4  1/4 : Lost airway and PEA arrest on, requiring emergent tracheostomy and ROSC obtained 1/5: 4/4 Blood cultures growing H.Influenza , narrowed to Unasyn 1/5 > 1/14: Continued on antibiotics without any significant improvement  1/14: New right foot swelling for which MRI obtained due to concerns for seeding of cellulitis from neck 1/15: MRI with synovitis and subcutaneous edema suggestive of cellulitis; orthopedics consulted for right ankle aspiration - aspirate with positive crystals suggestive of gout - patient started on NSAIDs and steroids 1/16 Started on dilaudid, D/C fentanyl  1/17: Patient agitated  early AM, q2h prn versed prescribed 1/18: Hb 5.2, transfused 2u pRBC; started methadone  1/19: Klonopin increased to tid and metoprolol added for SVTs 1/20: precedex gtt discontinued overnight for concerns of bradycardia  1/21: clonidine taper, 24hr trach collar 1/22: continuing trach collar 1/23: last day of unasyn for cellulitis, steroid taper and colchicine for gout, continuing trach collar  1/24: 72 hours of trach collar STUDIES:    CT Soft Tissue Neck W/ Contrast (1/4)  Left greater than right lower facial and infrahyoid neck subcutaneous edema and inflammatory changes extending into the left anterior chest wall. There is no collection. Possible pharyngeal and supraglottic laryngeal wall edema, which could be accentuated by artifact. There is mild thickening of the epiglottis. Airway remains patent. No evidence of peritonsillar abscess.   CT Chest W Contrast (1/4) Patchy airspace consolidations within the lingula and dependent left lower lobe, with some volume loss within the right lung base. Findings are favored to represent multifocal pneumonia.  Induration within the soft tissues at the base of the neck extending into the superior aspect of the left chest wall with associated skin thickening, which may reflect cellulitis.   CT Head (1/4) Subtle loss of gray-white differentiation in the anterior right lentiform insular ribbon. This could represent an acute/subacute infarct. Moderate generalized atrophy and diffuse white matter disease. This likely reflects the sequela of chronic microvascular ischemia.  MRI Brain WO Contrast (1/5) No acute intracranial abnormality.  CT Soft Tissue Neck wo Contrast (1/11) Persistent but slightly improved left greater than right  lower facial and infrahyoid neck subcutaneous edema and presumed inflammatory changes extending into the anterior chest wall. No evidence of developing collection  CT Chest wo Contrast (1/11) No subcutaneous or soft  tissue emphysema in the chest wall to suggest necrotizing fasciitis in the chest. Stable mild skin thickening with associated subcutaneous fat stranding in the high ventral left chest wall. New small dependent bilateral pleural effusions. New mild interlobular septal thickening. New patchy ground-glass opacities in both lungs predominantly in the upper lobes. New patchy centrilobular micronodularity in the right upper lobe. Acute anterior upper rib fractures bilaterally as detailed. No pneumothorax.  MR RIGHT ANKLE (1/14) No evidence of osteomyelitis, or soft tissue abscess. Mildly enhancing small ankle joint effusion which could be due to synovitis.Focal interstitial tear with posterior tibialis tenosynovitis. Mild flexor digitorum and flexor hallucis longus tenosynovitis.Focal complete disruption of the anterior talofibular ligament with fluid in the syndesmosis.Diffuse subcutaneous edema and cellulitis.  CULTURES:  Influenza A : Negative Influenza B : Negtive Sars Coronavirus: Negative MRSA Nasopharyngeal negative 1/4 Blood cultures: 4/4 Positive for H. Influenza  Urine culture: No growth 1/6 Blood cultures: NGTD on 1/11 1/13 Tracheal aspirate: few klebsiella pneumoniae  1/16 R ankle aspirate : monosodium urate crystals, no organisms on gram stain; culture w/ no growth to date  ANTIBIOTICS:  Bactrim 1/4 Azithromycin 1/4  Vancomycin 1/4 Zosyn  1/4 one dose Clindamycin 1/4 one dose Unasyn 1/5 >> 1/23   LINES/TUBES:  Tracheostomy - placed 1/4 Peripheral IV in Right Forearm - placed 1/9 Peripheral IV in Left Forearm - placed 1/9 PICC line double lumen - placed 1/15  CONSULTANTS:  Infectious Disease  ENT  Orthopedic surgery  SUBJECTIVE:  Overnight no acute issues.  He has been trach collar for 72 hours.  CONSTITUTIONAL: BP (!) 145/70   Pulse 74   Temp 98 F (36.7 C) (Oral)   Resp 13   Ht 5\' 8"  (1.727 m)   Wt 75.1 kg   SpO2 100%   BMI 25.17 kg/m   I/O last 3  completed shifts: In: 3406.1 [I.V.:361.6; NG/GT:2611.1; IV Piggyback:433.5] Out: 2475 [Urine:2475]   FiO2 (%):  [28 %-35 %] 35 %   PHYSICAL EXAM: General:  Laying in bed, no acute distress, on trach collar, non diaphoretic  Neuro: Patient awake, responsive to verbal stimuli and following commands HEENT:  Trach in place with improved neck swelling, no erythema or purulence noted from trach site.  Cardiovascular:  RRR, no murmurs, rubs, or gallops Lungs:  Lungs CTAB Abdomen:  BS+, distension improved Musculoskeletal:  Right ankle edema improved Skin: Improved edema and erythema of the left side of face, neck , chest extending to mid clavicular line GU: Foley in place, scrotal edema  ASSESSMENT AND PLAN   Severe Soft Tissue Cellulitis  Acute Airway compromise s/p emergent percutaneous tracheostomy H. Influenza Bacteremia 4/4 bottles  Cellulitis of left neck extended to anterior chest , with blood cultures growing H. Flu on 1/4 requiring tracheostomy for airway protection. Patient has completed unasyn on 1/23. He has tolerated trach collar since morning of 1/21 (72 hours total).  - Trend vitals, CBC - Changing out trach for smaller size  Agitation Alcohol Use Disorder Patient continued on seroquel, clonidine and clonazepam. Patient started on methadone on 1/18. Gabapentin and clonazepam doses increased 1/19. Methadone increased on 1/23. QTc wnl.  Plan: -Continue methadone 10 mg tid (can uptitrate on 1/26 prn) - Continue seroquel, clonazepam and gabapentin - Fentanyl prn  Intermittent SVT:  Patient with intermittent SVT's more  pronounced when agitated or working with PT/OT.  Plan: -Clonidine taper -Continue metoprolol   Generalized edema/anasarca: Significantly improved.  Plan: Continuing gentle diuresis   RUE Superficial thrombophlebitis:  Patient noted to have significant RUE swelling and vascular US with superficial vein thrombosis of right cephalic vein.  Plan: -  Tylenol prn - Warm compresses to RUE   R Ankle Gout:  Resolved. Patient has completed prednisone taper and colchicine on 1/23  Electrolyte abnormalities: Plan:  - Monitor and replete electrolytes as needed.    Best Practice / Goals of Care / Disposition.   DVT PROPHYLAXIS: Subq Heparin SUP: Pepcid bid NUTRITION: tube feeds MOBILITY: OOB as tolerated with PT/OT Code Status: Full  FAMILY DISCUSSIONS: Patient's partner updated via phone  DISPOSITION : Progressive  LABS  Glucose Recent Labs  Lab 07/01/19 0722 07/01/19 1108 07/01/19 1512 07/01/19 1954 07/01/19 2356 07/02/19 0352  GLUCAP 92 118* 111* 110* 103* 93    BMET Recent Labs  Lab 06/30/19 0318 07/01/19 0514 07/02/19 0403  NA 143 141 139  K 3.3* 4.0 4.1  CL 107 100 97*  CO2 28 31 31   BUN 10 16 16   CREATININE 0.63 0.68 0.59*  GLUCOSE 86 111* 103*    Liver Enzymes Recent Labs  Lab 06/26/19 0500 06/27/19 0353 06/28/19 0419  AST 12* 14* 15  ALT 14 14 16   ALKPHOS 129* 124 125  BILITOT 0.5 0.5 0.6  ALBUMIN 1.7* 1.8* 1.8*    Electrolytes Recent Labs  Lab 06/28/19 2018 06/29/19 0411 06/30/19 0318 07/01/19 0514 07/02/19 0403  CALCIUM 8.7*   < > 7.6* 8.9 9.2  MG 2.0  --   --  2.0  --   PHOS 5.1*  --   --   --   --    < > = values in this interval not displayed.    CBC Recent Labs  Lab 06/30/19 0318 07/01/19 0514 07/02/19 0403  WBC 6.5 7.7 7.5  HGB 8.0* 10.9* 11.3*  HCT 27.7* 37.7* 38.9*  PLT 455* 573* 521*    ABG No results for input(s): PHART, PCO2ART, PO2ART in the last 168 hours.  Coag's No results for input(s): APTT, INR in the last 168 hours.  Sepsis Markers No results for input(s): LATICACIDVEN, PROCALCITON, O2SATVEN in the last 168 hours.  Cardiac Enzymes No results for input(s): TROPONINI, PROBNP in the last 168 hours.    CRITICAL CARE: Harvie Heck, MD Internal Medicine, PGY-1 Pager # [phone] 07/02/2019  7:13 AM

## 2019-07-02 NOTE — Procedures (Signed)
6 cuffed shiley removed 4 uncuffed shiley placed No immediate complications

## 2019-07-03 ENCOUNTER — Inpatient Hospital Stay (HOSPITAL_COMMUNITY): Payer: Medicaid Other

## 2019-07-03 DIAGNOSIS — G9341 Metabolic encephalopathy: Secondary | ICD-10-CM

## 2019-07-03 DIAGNOSIS — Z430 Encounter for attention to tracheostomy: Secondary | ICD-10-CM

## 2019-07-03 DIAGNOSIS — J9811 Atelectasis: Secondary | ICD-10-CM

## 2019-07-03 LAB — CBC
HCT: 40.4 % (ref 39.0–52.0)
Hemoglobin: 11.8 g/dL — ABNORMAL LOW (ref 13.0–17.0)
MCH: 25 pg — ABNORMAL LOW (ref 26.0–34.0)
MCHC: 29.2 g/dL — ABNORMAL LOW (ref 30.0–36.0)
MCV: 85.6 fL (ref 80.0–100.0)
Platelets: 504 10*3/uL — ABNORMAL HIGH (ref 150–400)
RBC: 4.72 MIL/uL (ref 4.22–5.81)
RDW: 19.2 % — ABNORMAL HIGH (ref 11.5–15.5)
WBC: 8.4 10*3/uL (ref 4.0–10.5)
nRBC: 0 % (ref 0.0–0.2)

## 2019-07-03 LAB — BASIC METABOLIC PANEL
Anion gap: 11 (ref 5–15)
BUN: 19 mg/dL (ref 6–20)
CO2: 31 mmol/L (ref 22–32)
Calcium: 9.3 mg/dL (ref 8.9–10.3)
Chloride: 94 mmol/L — ABNORMAL LOW (ref 98–111)
Creatinine, Ser: 0.94 mg/dL (ref 0.61–1.24)
GFR calc Af Amer: >60 mL/min (ref >60)
GFR calc non Af Amer: >60 mL/min (ref >60)
Glucose, Bld: 106 mg/dL — ABNORMAL HIGH (ref 70–99)
Potassium: 4.3 mmol/L (ref 3.5–5.1)
Sodium: 136 mmol/L (ref 135–145)

## 2019-07-03 LAB — GLUCOSE, CAPILLARY
Glucose-Capillary: 112 mg/dL — ABNORMAL HIGH (ref 70–99)
Glucose-Capillary: 114 mg/dL — ABNORMAL HIGH (ref 70–99)
Glucose-Capillary: 116 mg/dL — ABNORMAL HIGH (ref 70–99)
Glucose-Capillary: 91 mg/dL (ref 70–99)
Glucose-Capillary: 97 mg/dL (ref 70–99)
Glucose-Capillary: 99 mg/dL (ref 70–99)

## 2019-07-03 MED ORDER — FREE WATER
200.0000 mL | Freq: Three times a day (TID) | Status: DC
  Administered 2019-07-03 – 2019-07-11 (×21): 200 mL

## 2019-07-03 MED ORDER — CLONAZEPAM 0.25 MG PO TBDP
1.0000 mg | ORAL_TABLET | Freq: Three times a day (TID) | ORAL | Status: DC
  Administered 2019-07-03 – 2019-07-04 (×2): 1 mg
  Filled 2019-07-03 (×2): qty 4
  Filled 2019-07-03: qty 8

## 2019-07-03 MED ORDER — GUAIFENESIN 100 MG/5ML PO SOLN
15.0000 mL | Freq: Four times a day (QID) | ORAL | Status: DC
  Administered 2019-07-03 – 2019-07-09 (×20): 300 mg
  Administered 2019-07-10: 15 mL
  Administered 2019-07-10 – 2019-07-31 (×74): 300 mg
  Filled 2019-07-03: qty 15
  Filled 2019-07-03: qty 10
  Filled 2019-07-03 (×7): qty 15
  Filled 2019-07-03: qty 45
  Filled 2019-07-03 (×9): qty 15
  Filled 2019-07-03: qty 75
  Filled 2019-07-03 (×3): qty 15
  Filled 2019-07-03: qty 45
  Filled 2019-07-03 (×8): qty 15
  Filled 2019-07-03: qty 45
  Filled 2019-07-03: qty 15
  Filled 2019-07-03: qty 5
  Filled 2019-07-03 (×8): qty 15
  Filled 2019-07-03: qty 45
  Filled 2019-07-03 (×2): qty 15
  Filled 2019-07-03: qty 45
  Filled 2019-07-03: qty 15
  Filled 2019-07-03: qty 75
  Filled 2019-07-03: qty 15
  Filled 2019-07-03: qty 45
  Filled 2019-07-03: qty 10
  Filled 2019-07-03 (×7): qty 15
  Filled 2019-07-03: qty 45
  Filled 2019-07-03 (×23): qty 15
  Filled 2019-07-03: qty 45
  Filled 2019-07-03 (×3): qty 15
  Filled 2019-07-03: qty 45
  Filled 2019-07-03: qty 15
  Filled 2019-07-03: qty 10
  Filled 2019-07-03: qty 15
  Filled 2019-07-03: qty 5
  Filled 2019-07-03 (×2): qty 15
  Filled 2019-07-03: qty 45
  Filled 2019-07-03 (×2): qty 15

## 2019-07-03 MED FILL — Sodium Chloride IV Soln 0.9%: INTRAVENOUS | Qty: 250 | Status: AC

## 2019-07-03 MED FILL — Fentanyl Citrate Preservative Free (PF) Inj 2500 MCG/50ML: INTRAMUSCULAR | Qty: 50 | Status: AC

## 2019-07-03 NOTE — Progress Notes (Signed)
Physical Therapy Treatment Patient Details Name: [name]. MRN: [id number] DOB: [birth year] Today's Date: 07/03/2019    History of Present Illness 59 year old man with a history of alcohol use disorder, tobacco used disorder, HTN, and recent admission on 04/10/2019 for pancreatitis and hyponatremia who presents for evaluation of shortness of breath and neck swelling.     PT Comments    Patient received in bed, lethargic due to sedative medications; RN reports they would like [place] to attempt mobility to see if this helps with cognition/helps promote improved alertness. Required totalAx2 for all bed mobility, opened eyes partially one time otherwise lethargic and dependent on totalA to maintain balance. Returned him to supine and focused remainder of session on BLE ROM and stretching. He was left in bed with all needs met, restraints reapplied, bed alarm active and RN aware of results of session.     Follow Up Recommendations  SNF;Supervision for mobility/OOB     Equipment Recommendations  None recommended by PT    Recommendations for Other Services       Precautions / Restrictions Precautions Precautions: Fall Precaution Comments: trach collar, bilat mittens, posey wrist restraints Restrictions Weight Bearing Restrictions: No    Mobility  Bed Mobility Overal bed mobility: Needs Assistance Bed Mobility: Supine to Sit;Sit to Supine   Sidelying to sit: Total assist;+2 for physical assistance Supine to sit: Total assist;+2 for physical assistance     General bed mobility comments: lethargic due to sedatives- totalAx2 to sit at EOB and sit for 2-3 minutes with no improvements in cognition or level of alertness  Transfers                 General transfer comment: deferred- lethargy  Ambulation/Gait             General Gait Details: deferred- lethargy   Stairs             Wheelchair Mobility    Modified Rankin (Stroke Patients Only)        Balance Overall balance assessment: Needs assistance Sitting-balance support: No upper extremity supported;Feet unsupported Sitting balance-Leahy Scale: Zero Sitting balance - Comments: totalA to maintain balance at EOB due to lethargy- multidirectional balance loss                                    Cognition Arousal/Alertness: Lethargic;Suspect due to medications Behavior During Therapy: Flat affect Overall Cognitive Status: Impaired/Different from baseline                     Current Attention Level: Focused   Following Commands: Follows one step commands inconsistently   Awareness: Intellectual   General Comments: very lethargic, "Snowed", due to sedatives; A&Ox0      Exercises      General Comments General comments (skin integrity, edema, etc.): VSS, seemed to desat but this was likely due to poor signal as monitor wsa coming off of his ear      Pertinent Vitals/Pain Pain Assessment: Faces Pain Score: 0-No pain Pain Intervention(s): Limited activity within patient's tolerance;Monitored during session    Home Living                      Prior Function            PT Goals (current goals can now be found in the care plan section) Acute Rehab PT Goals Patient Stated Goal:  unable to state PT Goal Formulation: Patient unable to participate in goal setting Potential to Achieve Goals: Fair Progress towards PT goals: Not progressing toward goals - comment(limited by lethargy from sedative meds)    Frequency    Min 2X/week      PT Plan Current plan remains appropriate    Co-evaluation              AM-PAC PT "6 Clicks" Mobility   Outcome Measure  Help needed turning from your back to your side while in a flat bed without using bedrails?: A Lot Help needed moving from lying on your back to sitting on the side of a flat bed without using bedrails?: Total Help needed moving to and from a bed to a chair (including a  wheelchair)?: Total Help needed standing up from a chair using your arms (e.g., wheelchair or bedside chair)?: Total Help needed to walk in hospital room?: Total Help needed climbing 3-5 steps with a railing? : Total 6 Click Score: 7    End of Session Equipment Utilized During Treatment: Oxygen Activity Tolerance: Patient limited by lethargy Patient left: in bed;with call bell/phone within reach;with restraints reapplied;with bed alarm set Nurse Communication: Mobility status PT Visit Diagnosis: Muscle weakness (generalized) (M62.81);Difficulty in walking, not elsewhere classified (R26.2) Pain - part of body: (abdomen)     Time: 3112-1624 PT Time Calculation (min) (ACUTE ONLY): 19 min  Charges:  $Therapeutic Activity: 8-22 mins                     Windell Norfolk, DPT, PN1   Supplemental Physical Therapist [place]    Pager [phone] Acute Rehab Office [phone]

## 2019-07-03 NOTE — Progress Notes (Signed)
SLP Cancellation Note  Patient Details Name: [name]. MRN: [id number] DOB: 02-04-1961   Cancelled treatment:       Reason Eval/Treat Not Completed: Other (comment) Attempted to see pt for PMV evaluation. Obtunded; would not arouse despite sternal rub, cold washcloth to face.  Will continue efforts.  Mylen Mangan L. Tivis Ringer, [place] Office number [phone] Pager [phone]    [name] 07/03/2019, 1:17 PM

## 2019-07-03 NOTE — Progress Notes (Signed)
Foley catheter removed per protocol at 1550hrs.  Condom catheter placed.  Will monitor for U/O.

## 2019-07-03 NOTE — Progress Notes (Signed)
[facility] PROGRESS NOTE    Progress Note  [name].  [id number] DOB: 10-10-1960 DOA: 06/12/2019 PCP: William Chou, NP     Brief Narrative:   [name]. is an 59 y.o. male past medical history of alcohol abuse and tobacco, essential hypertension recent discharge from the hospital for pancreatitis and hyponatremia who comes back to the ED for evaluation of shortness of breath and neck swelling he reports that he started about 5 days prior to admission and is progressively getting worst.  He was found tachycardic and tachypneic in the ED with difficulty breathing admitting to the ICU on 06/12/2019 with loss of airway and PEA arrest requiring emergent tracheostomy. CT of the neck soft tissue with contrast on 06/12/2019 showed left greater than right facial infrahyoid neck subcutaneous edema and inflammatory changes extending into the left anterior chest wall there is no fluid collection, but there is possible pharyngeal and supraglottic laryngeal wall edema.  CT chest with contrast on 06/12/2019 showed patchy airspace consolidation in the lingula and left lower lobe which favors multifocal pneumonia with soft tissue at the base of the neck extending superiorly.  Head CT 06/12/2019: Subtle loss of gray-white matter differentiation anterior right lentiform, which could represent acute/subacute infarct with moderate sized atrophy, likely reflect sequela of chronic microvascular ischemic changes.  MRI of the brain without contrast on 06/13/2019: Showed no acute abnormalities.  CT soft tissue of the neck without contrast 06/09/2019: Persistent left greater than right lower facial and infrahyoid neck subcutaneous edema and presumed inflammatory changes extending into anterior chest wall  CT chest without contrast 06/09/2019: Showed no subcutaneous soft tissue emphysema in the chest wall and the chest wall to suggest necrotizing fasciitis of the chest, mild scale thickening associated with  subcutaneous fat stranding.  With new small bilateral pleural effusions.  New mild interlobular septal thickening, new patchy groundglass opacities in both lungs predominantly in the upper lobes.  With new patchy centrilobular micronodularity in the right upper lobe.  Upper rib fractures bilaterally no pneumothorax.  MRI of the ankle 06/22/2019: No evidence of osteomyelitis or abscesses.  Some small ankle joint effusion, focal complete disruption of the anterior talofibular ligament with fluid and diffuse subcutaneous edema and cellulitis. ANTIBIOTICS:  Bactrim 1/4 Azithromycin 1/4  Vancomycin 1/4 Zosyn  1/4 one dose Clindamycin 1/4 one dose Unasyn 1/5 >> 1/23  LINES/TUBES:  Tracheostomy - placed 1/4 Peripheral IV in Right Forearm - placed 1/9 Peripheral IV in Left Forearm - placed 1/9 PICC line double lumen - placed 1/15  SIGNIFICANT EVENTS:  Admit to ICU on 1/4  1/4 : Lost airway and PEA arrest on, requiring emergent tracheostomy and ROSC obtained 1/5: 4/4 Blood cultures growing H.Influenza , narrowed to Unasyn 1/5 > 1/14: Continued on antibiotics without any significant improvement  1/14: New right foot swelling for which MRI obtained due to concerns for seeding of cellulitis from neck 1/15: MRI with synovitis and subcutaneous edema suggestive of cellulitis; orthopedics consulted for right ankle aspiration - aspirate with positive crystals suggestive of gout - patient started on NSAIDs and steroids 1/16 Started on dilaudid, D/C fentanyl  1/17: Patient agitated early AM, q2h prn versed prescribed 1/18: Hb 5.2, transfused 2u pRBC; started methadone  1/19: Klonopin increased to tid and metoprolol added for SVTs 1/20: precedex gtt discontinued overnight for concerns of bradycardia  1/21: clonidine taper, 24hr trach collar 1/22: continuing trach collar 1/23: last day of unasyn for cellulitis, steroid taper and colchicine for gout, continuing  trach collar  1/24: 72 hours of trach  collar   Assessment/Plan:   Severe soft tissue cellulitis/  Acute respiratory failure with hypoxemia (HCC)acute airway compromise status post emergent percutaneous tracheostomy/  Haemophilis influenzae bacteremia/  Multifocal pneumonia  : 4 out of 4 blood cultures grew H influenza beta-lactamase negative.  He has completed his course of IV Unasyn of 21 days on on 07/01/2019. He has tolerated trach collar size on 06/29/2019, and now has been changed out to a smaller size. Swallowing evaluation is pending for PMV, can consider trial X week.  Acute confusional state/alcohol use disorder: Continue methadone 10 mg 3 times daily, Seroquel clonazepam and gabapentin.  Fentanyl as needed.  Intermittent SVT: Patient with intermittent SVT, continue clonidine taper and metoprolol scheduled.  Generalized edema/anasarca: He is negative about 11 L, will hold his Lasix for now. Creatinine is increased, continue intermittent basic metabolic panels.  Right upper extremity edema: Right upper extremity vascular ultrasound was done that showed superficial vein thrombosis: Continue Tylenol and warm complexes to the right upper extremity likely superficial thrombophlebitis.  Right ankle gout: He completed his course of steroid taper now resolved.  Electrolyte abnormalities: Now resolved.  Nutrition: Currently with an NG tube at 60 an hour. We will start him on free water per tube. Swallowing evaluation is pending.  Cardiac arrest, cause unspecified ([place])  Chronic hepatitis C without hepatic coma (HCC)   Malnutrition of moderate degree: Continue enteral nutrition awaiting swallowing evaluation. Malnutrition Type:  Nutrition Problem: Moderate Malnutrition Etiology: chronic illness(COPD, alcoholism)   Malnutrition Characteristics:  Signs/Symptoms: mild fat depletion, moderate fat depletion, mild muscle depletion   Nutrition Interventions:  Interventions: Refer to RD note for  recommendations    DVT prophylaxis: lovenox Family Communication:none Disposition Plan/Barrier to D/C: Patient came from home but will probably need to go to skilled nursing facility he has evaluated the patient recommended skilled Code Status:  Code Status History    Date Active Date Inactive Code Status Order ID Comments User Context   05/01/2019 1558 05/05/2019 1835 Full Code [id number]  Daisy Floro, DO ED   11/26/2018 1438 12/04/2018 1726 Full Code [id number]  Lars Mage, MD ED   01/31/2017 1657 02/03/2017 1549 Full Code [id number]  Jamesetta So, MD ED   08/21/2016 1550 08/21/2016 2035 Full Code [id number]  Fredia Sorrow, MD ED   07/02/2012 2014 07/04/2012 1256 Full Code [id number]  Jesse Sans, RN ED   07/02/2012 1714 07/02/2012 2014 Full Code [id number]  Montine Circle, PA-C ED   03/08/2012 1225 03/08/2012 2108 Full Code [id number]  Leota Jacobsen, MD ED   Advance Care Planning Activity        IV Access:    Peripheral IV   Procedures and diagnostic studies:   No results found.   Medical Consultants:    None.  Anti-Infectives:   None  Subjective:    Tammie L Ranae Pugh. nonverbal but he nods no is in any pain.  Objective:    Vitals:   07/03/19 0019 07/03/19 0206 07/03/19 0402 07/03/19 0756  BP: 122/66 (!) 101/58  126/62  Pulse:    85  Resp:    17  Temp: 98.2 F (36.8 C)  98.1 F (36.7 C)   TempSrc: Oral  Oral   SpO2:    93%  Weight:   77.2 kg   Height:       SpO2: 93 % O2 Flow Rate (L/min): 5 L/min FiO2 (%): 28 %   Intake/Output Summary (Last 24  hours) at 07/03/2019 0815 Last data filed at 07/03/2019 0600 Gross per 24 hour  Intake 579.99 ml  Output 950 ml  Net -370.01 ml   Filed Weights   07/01/19 0500 07/02/19 0500 07/03/19 0402  Weight: 75.1 kg 75.1 kg 77.2 kg    Exam: General exam: In no acute distress, trach in place. Respiratory system: Good air movement and clear to auscultation. Cardiovascular system: S1 & S2 heard, RRR.  No JVD Gastrointestinal system: Abdomen is nondistended, soft and nontender.   Extremities: No pedal edema. Skin: No rashes, lesions or ulcers   Data Reviewed:    Labs: Basic Metabolic Panel: Recent Labs  Lab 06/28/19 2018 06/28/19 2018 06/29/19 0411 06/29/19 0411 06/30/19 [id number] 06/30/19 [id number] 07/01/19 [id number] 07/01/19 0514 07/02/19 0403 07/03/19 0433  NA 142   < > 142  --  143  --  141  --  139 136  K 3.6   < > 3.4*   < > 3.3*   < > 4.0   < > 4.1 4.3  CL 99   < > 101  --  107  --  100  --  97* 94*  CO2 34*   < > 32  --  28  --  31  --  31 31  GLUCOSE 109*   < > 133*  --  86  --  111*  --  103* 106*  BUN 11   < > 12  --  10  --  16  --  16 19  CREATININE 0.63   < > 0.64  --  0.63  --  0.68  --  0.59* 0.94  CALCIUM 8.7*   < > 8.7*  --  7.6*  --  8.9  --  9.2 9.3  MG 2.0  --   --   --   --   --  2.0  --   --   --   PHOS 5.1*  --   --   --   --   --   --   --   --   --    < > = values in this interval not displayed.   GFR Estimated Creatinine Clearance: 83.9 mL/min (by C-G formula based on SCr of 0.94 mg/dL). Liver Function Tests: Recent Labs  Lab 06/27/19 0353 06/28/19 0419  AST 14* 15  ALT 14 16  ALKPHOS 124 125  BILITOT 0.5 0.6  PROT 5.7* 5.8*  ALBUMIN 1.8* 1.8*   No results for input(s): LIPASE, AMYLASE in the last 168 hours. No results for input(s): AMMONIA in the last 168 hours. Coagulation profile No results for input(s): INR, PROTIME in the last 168 hours. COVID-19 Labs  No results for input(s): DDIMER, FERRITIN, LDH, CRP in the last 72 hours.  Lab Results  Component Value Date   SARSCOV2NAA NEGATIVE 06/12/2019   [place] NEGATIVE 05/01/2019   SARSCOV2NAA NOT DETECTED 04/14/2019   [place] Not Detected 03/30/2019    CBC: Recent Labs  Lab 06/29/19 0411 06/30/19 0318 07/01/19 0514 07/02/19 0403 07/03/19 0433  WBC 6.9 6.5 7.7 7.5 8.4  HGB 9.5* 8.0* 10.9* 11.3* 11.8*  HCT 33.0* 27.7* 37.7* 38.9* 40.4  MCV 85.7 86.0 85.9 85.5 85.6  PLT  560* 455* 573* 521* 504*   Cardiac Enzymes: No results for input(s): CKTOTAL, CKMB, CKMBINDEX, TROPONINI in the last 168 hours. BNP (last 3 results) No results for input(s): PROBNP in the last 8760 hours. CBG: Recent Labs  Lab 07/02/19 1115 07/02/19 1503  07/02/19 2005 07/03/19 0015 07/03/19 0401  GLUCAP 100* 109* 97 116* 112*   D-Dimer: No results for input(s): DDIMER in the last 72 hours. Hgb A1c: No results for input(s): HGBA1C in the last 72 hours. Lipid Profile: No results for input(s): CHOL, HDL, LDLCALC, TRIG, CHOLHDL, LDLDIRECT in the last 72 hours. Thyroid function studies: No results for input(s): TSH, T4TOTAL, T3FREE, THYROIDAB in the last 72 hours.  Invalid input(s): FREET3 Anemia work up: No results for input(s): VITAMINB12, FOLATE, FERRITIN, TIBC, IRON, RETICCTPCT in the last 72 hours. Sepsis Labs: Recent Labs  Lab 06/30/19 0318 07/01/19 0514 07/02/19 0403 07/03/19 0433  WBC 6.5 7.7 7.5 8.4   Microbiology Recent Results (from the past 240 hour(s))  Body fluid culture     Status: None   Collection Time: 06/23/19 12:22 PM   Specimen: Synovium; Synovial Fluid  Result Value Ref Range Status   Specimen Description SYNOVIAL RIGHT ANKLE  Final   Special Requests NONE  Final   Gram Stain   Final    MODERATE WBC PRESENT, PREDOMINANTLY PMN NO ORGANISMS SEEN    Culture   Final    NO GROWTH 3 DAYS Performed at [facility], [address], [place], [place] [address]    Report Status 06/26/2019 FINAL  Final  Anaerobic culture     Status: None   Collection Time: 06/23/19 12:22 PM   Specimen: Synovium; Synovial Fluid  Result Value Ref Range Status   Specimen Description SYNOVIAL RIGHT ANKLE  Final   Special Requests NONE  Final   Culture   Final    NO ANAEROBES ISOLATED Performed at [facility], [address], [place], [place] [address]    Report Status 06/28/2019 FINAL  Final  Gonococcus culture     Status: None   Collection Time:  06/23/19 12:22 PM   Specimen: Synovium; Synovial Fluid  Result Value Ref Range Status   Specimen Description SYNOVIAL RIGHT ANKLE  Final   Special Requests NONE  Final   Culture   Final    NO NEISSERIA GONORRHOEAE ISOLATED Performed at [facility], [address] [place], [place] [address]    Report Status 06/25/2019 FINAL  Final     Medications:   . chlorhexidine  15 mL Mouth Rinse BID  . Chlorhexidine Gluconate Cloth  6 each Topical Daily  . clonazepam  2 mg Per Tube TID  . cloNIDine  0.1 mg Per Tube Q12H   Followed by  . cloNIDine  0.1 mg Per Tube Q24H  . famotidine  20 mg Per Tube BID  . folic acid  1 mg Intravenous Daily  . gabapentin  900 mg Per Tube TID  . heparin injection (subcutaneous)  5,000 Units Subcutaneous Q8H  . insulin aspart  0-15 Units Subcutaneous Q4H  . mouth rinse  15 mL Mouth Rinse q12n4p  . methadone  10 mg Per Tube TID  . metoprolol tartrate  50 mg Per Tube BID  . nicotine  21 mg Transdermal Daily  . polyethylene glycol  17 g Per Tube Daily  . QUEtiapine  200 mg Per Tube BID  . sodium chloride flush  10-40 mL Intracatheter Q12H  . thiamine  100 mg Per Tube Daily   Continuous Infusions: . sodium chloride 10 mL/hr at 07/02/19 1000  . feeding supplement (VITAL AF 1.2 CAL) 1,000 mL (07/03/19 0329)      LOS: 21 days   Charlynne Cousins  [facility]  07/03/2019, 8:15  AM

## 2019-07-03 NOTE — Progress Notes (Signed)
PULMONARY / CRITICAL CARE MEDICINE  NAME:  [name]., MRN:  [id number], DOB:  29-Nov-1960, LOS: 21 ADMISSION DATE:  06/12/2019, CONSULTATION DATE:  06/12/2019 REFERRING MD:  Alinda Sierras, CHIEF COMPLAINT:  Shortness of breath, sore throat, and neck swelling  BRIEF HISTORY:    59 year old man with a history of alcohol use disorder, tobacco used disorder, HTN, and recent admission on 04/10/2019 for pancreatitis and hyponatremia who presents for evaluation of shortness of breath and neck swelling. Reported  to have started 5 days ago and progressively gotten worse. Patient tachycardic to 160's and tachypnic to the 40's in the ED. Seen in ED, patient says he has neck swelling , cough, and difficulty breathing. Admitted to ICU on 1/4 with loss of airway and PEA arrest requiring emergent tracheostomy. Patient has been difficult to wean off sedation   SIGNIFICANT PAST MEDICAL HISTORY    Past Medical History:  Diagnosis Date  . Alcohol abuse   . Alcoholic ([place])   . Back pain   . CAP (community acquired pneumonia) 11/26/2018  . COPD (chronic obstructive pulmonary disease) ([place])   . Empyema of right pleural space ([place]) 11/26/2018  . Heavy smoker   . Hep C w/ coma, chronic ([place])   . Loose, teeth   . Pancreatitis   . Poor dentition   . Tobacco abuse    SIGNIFICANT EVENTS:  Admit to ICU on 1/4  1/4 : Lost airway and PEA arrest on, requiring emergent tracheostomy and ROSC obtained 1/5: 4/4 Blood cultures growing H.Influenza , narrowed to Unasyn 1/5 > 1/14: Continued on antibiotics without any significant improvement  1/14: New right foot swelling for which MRI obtained due to concerns for seeding of cellulitis from neck 1/15: MRI with synovitis and subcutaneous edema suggestive of cellulitis; orthopedics consulted for right ankle aspiration - aspirate with positive crystals suggestive of gout - patient started on NSAIDs and steroids 1/16 Started on dilaudid, D/C fentanyl  1/17: Patient agitated  early AM, q2h prn versed prescribed 1/18: Hb 5.2, transfused 2u pRBC; started methadone  1/19: Klonopin increased to tid and metoprolol added for SVTs 1/20: precedex gtt discontinued overnight for concerns of bradycardia  1/21: clonidine taper, 24hr trach collar 1/22: continuing trach collar 1/23: last day of unasyn for cellulitis, steroid taper and colchicine for gout, continuing trach collar  1/24: 72 hours of trach collar 1/25 changed to 4 cuffless STUDIES:    CT Soft Tissue Neck W/ Contrast (1/4)  Left greater than right lower facial and infrahyoid neck subcutaneous edema and inflammatory changes extending into the left anterior chest wall. There is no collection. Possible pharyngeal and supraglottic laryngeal wall edema, which could be accentuated by artifact. There is mild thickening of the epiglottis. Airway remains patent. No evidence of peritonsillar abscess.   CT Chest W Contrast (1/4) Patchy airspace consolidations within the lingula and dependent left lower lobe, with some volume loss within the right lung base. Findings are favored to represent multifocal pneumonia.  Induration within the soft tissues at the base of the neck extending into the superior aspect of the left chest wall with associated skin thickening, which may reflect cellulitis.   CT Head (1/4) Subtle loss of gray-white differentiation in the anterior right lentiform insular ribbon. This could represent an acute/subacute infarct. Moderate generalized atrophy and diffuse white matter disease. This likely reflects the sequela of chronic microvascular ischemia.  MRI Brain WO Contrast (1/5) No acute intracranial abnormality.  CT Soft Tissue Neck wo Contrast (1/11) Persistent but slightly  improved left greater than right lower facial and infrahyoid neck subcutaneous edema and presumed inflammatory changes extending into the anterior chest wall. No evidence of developing collection  CT Chest wo Contrast  (1/11) No subcutaneous or soft tissue emphysema in the chest wall to suggest necrotizing fasciitis in the chest. Stable mild skin thickening with associated subcutaneous fat stranding in the high ventral left chest wall. New small dependent bilateral pleural effusions. New mild interlobular septal thickening. New patchy ground-glass opacities in both lungs predominantly in the upper lobes. New patchy centrilobular micronodularity in the right upper lobe. Acute anterior upper rib fractures bilaterally as detailed. No pneumothorax.  MR RIGHT ANKLE (1/14) No evidence of osteomyelitis, or soft tissue abscess. Mildly enhancing small ankle joint effusion which could be due to synovitis.Focal interstitial tear with posterior tibialis tenosynovitis. Mild flexor digitorum and flexor hallucis longus tenosynovitis.Focal complete disruption of the anterior talofibular ligament with fluid in the syndesmosis.Diffuse subcutaneous edema and cellulitis.  CULTURES:  Influenza A : Negative Influenza B : Negtive Sars Coronavirus: Negative MRSA Nasopharyngeal negative 1/4 Blood cultures: 4/4 Positive for H. Influenza  Urine culture: No growth 1/6 Blood cultures: NGTD on 1/11 1/13 Tracheal aspirate: few klebsiella pneumoniae  1/16 R ankle aspirate : monosodium urate crystals, no organisms on gram stain; culture w/ no growth to date  ANTIBIOTICS:  Bactrim 1/4 Azithromycin 1/4  Vancomycin 1/4 Zosyn  1/4 one dose Clindamycin 1/4 one dose Unasyn 1/5 >> 1/23   LINES/TUBES:  Tracheostomy - placed 1/4 Peripheral IV in Right Forearm - placed 1/9 Peripheral IV in Left Forearm - placed 1/9 PICC line double lumen - placed 1/15  CONSULTANTS:  Infectious Disease  ENT  Orthopedic surgery  SUBJECTIVE:  Remains on trach collar.  35%.  Required suction, cough mechanics poor..  CONSTITUTIONAL: Blood Pressure 126/62 (BP Location: Left Arm)   Pulse 85   Temperature 98.1 F (36.7 C) (Oral)   Respiration  17   Height 5\' 8"  (1.727 m)   Weight 77.2 kg   Oxygen Saturation 93%   Body Mass Index 25.88 kg/m   I/O last 3 completed shifts: In: 1759.2 [I.V.:199.2; NG/GT:1460; IV Piggyback:100] Out: 1450 [Urine:1450]   FiO2 (%):  [28 %-35 %] 28 %   PHYSICAL EXAM: General this is a 59 year old white male is currently lying in bed HEENT normocephalic atraumatic swelling subsided.  Size 4 cuffless tracheostomy in place, he is able to phonate some with finger occlusion, weak cough today Pulmonary: Scattered rhonchi no accessory use Oxygen requirements: 35% aerosol trach collar Cardiac regular rate and rhythm Abdomen soft not tender no organomegaly Extremities are warm and dry pulses are palpable Neuro: Awake wakes with soft gentle physical stimulation, moves all extremities.  Sleepy.  Oriented x2  ASSESSMENT AND PLAN   Severe Soft Tissue Cellulitis of left neck resulting in Acute Airway compromise s/p emergent percutaneous tracheostomy->completed 21 days Unasyn H. Influenza Bacteremia 4/4 bottles  Agitation/ TME superimposed on Alcohol Use Disorder Intermittent SVT Generalized edema/anasarca: RUE Superficial thrombophlebitis:  R Ankle Gout (flare resolved w/ pred taper and colchicine 1/23)  Intermittent electrolyte abnormalities:   Pulmonary and associated problem List Tracheostomy dependence s/p upper airway obstruction from left neck cellulitis.  Diffuse bilateral airspace disease (treated aspiration) Left basilar atelectasis Residual acute metabolic encephalopathy -down sized to 4 cuffless on 1/24 -remains agitated at times requiring physical restraints for safety -episodes of desaturation as low as 87%, nursing staff reports thick tenacious mucus from tracheostomy -Speech quality weak currently -Still fairly sleepy Plan Would  continue to back off on sedating medications, he is on scheduled clonazepam 3 times a day, Catapres taper , Neurontin, and methadone in addition to Seroquel  200 mg twice a day We will discontinue as needed fentanyl, he should not have this on the medical floor  We will begin a slow clonazepam taper Add Mucinex Ensure adequate free water SLP evaluation, we need to initiate Passy-Muir valve trials Mobilize as much as able CXR today. Has not had one is several days. With desaturation would like to f/u clearing of PNA and LLL ATX given desaturation  Hopefully eventually we can get him decannulated, I see his residual encephalopathy is the primary barrier to this  Erick Colace ACNP-BC [place] Pager # [phone] OR # [phone] if no answer

## 2019-07-03 NOTE — Progress Notes (Signed)
Patient remains lethargic, only opening eyes to verbal stimulation. Continues to reach for trach when awakened.  Dr. Aileen Fass notified and order to hold 1600 hr gabapentin, methadone and klonopin.  Meds held and will continue to monitor LOC and agitation.

## 2019-07-03 NOTE — Progress Notes (Signed)
Speech therapy came by to place [name] and the patient was very lethargic and would not wake up enough to communicate, she will try again later today.

## 2019-07-03 NOTE — Progress Notes (Signed)
RT at bedside.  Pt actively vomiting.  Suctioned pt mouth and airway copious amounts of th tan secretions.  RN aware CXR ordered by Pulmonary

## 2019-07-04 LAB — GLUCOSE, CAPILLARY
Glucose-Capillary: 107 mg/dL — ABNORMAL HIGH (ref 70–99)
Glucose-Capillary: 121 mg/dL — ABNORMAL HIGH (ref 70–99)
Glucose-Capillary: 127 mg/dL — ABNORMAL HIGH (ref 70–99)
Glucose-Capillary: 131 mg/dL — ABNORMAL HIGH (ref 70–99)
Glucose-Capillary: 94 mg/dL (ref 70–99)
Glucose-Capillary: 99 mg/dL (ref 70–99)

## 2019-07-04 LAB — BASIC METABOLIC PANEL
Anion gap: 10 (ref 5–15)
BUN: 17 mg/dL (ref 6–20)
CO2: 32 mmol/L (ref 22–32)
Calcium: 9.5 mg/dL (ref 8.9–10.3)
Chloride: 96 mmol/L — ABNORMAL LOW (ref 98–111)
Creatinine, Ser: 0.62 mg/dL (ref 0.61–1.24)
GFR calc Af Amer: >60 mL/min (ref >60)
GFR calc non Af Amer: >60 mL/min (ref >60)
Glucose, Bld: 116 mg/dL — ABNORMAL HIGH (ref 70–99)
Potassium: 4.4 mmol/L (ref 3.5–5.1)
Sodium: 138 mmol/L (ref 135–145)

## 2019-07-04 MED ORDER — TAMSULOSIN HCL 0.4 MG PO CAPS
0.4000 mg | ORAL_CAPSULE | Freq: Every day | ORAL | Status: DC
  Administered 2019-07-04: 0.4 mg via ORAL
  Filled 2019-07-04 (×2): qty 1

## 2019-07-04 NOTE — Progress Notes (Signed)
Occupational Therapy Treatment Patient Details Name: [name]. MRN: [id number] DOB: Oct 19, 1960 Today's Date: 07/04/2019    History of present illness 59 year old man with a history of alcohol use disorder, tobacco used disorder, HTN, and recent admission on 04/10/2019 for pancreatitis and hyponatremia who presents for evaluation of shortness of breath and neck swelling.    OT comments  Pt following some commands today with eyes opening after verbal ad tactile cues. Pt required max A to roll L and R in bed, unable to sit EOB this session. Pt tolerated ROM exercises in multiple planes, washed face and hands hand over hand max A. OT will continue to follow acutely  Follow Up Recommendations  SNF;Supervision/Assistance - 24 hour    Equipment Recommendations  Other (comment)(TBD at next venue of care)    Recommendations for Other Services      Precautions / Restrictions Precautions Precautions: Fall Precaution Comments: trach collar, B mittens, posey wrist restraints Restrictions Weight Bearing Restrictions: No       Mobility Bed Mobility Overal bed mobility: Needs Assistance   Rolling: Max assist         General bed mobility comments: more alert today, following some commands.  Transfers                      Balance                                           ADL either performed or assessed with clinical judgement   ADL Overall ADL's : Needs assistance/impaired     Grooming: Wash/dry face;Maximal assistance;Bed level;Wash/dry hands Grooming Details (indicate cue type and reason): hand over hand support to wash face with R UE                                General ADL Comments: pt limited by cognition, weakness     Vision Patient Visual Report: No change from baseline     Perception     Praxis      Cognition Arousal/Alertness: Lethargic Behavior During Therapy: Flat affect Overall Cognitive Status:  Impaired/Different from baseline Area of Impairment: Attention;Following commands;Awareness                       Following Commands: Follows one step commands inconsistently                Exercises General Exercises - Upper Extremity Shoulder Flexion: AROM;Both;10 reps;Supine Shoulder Extension: AROM;Both;10 reps;Supine Shoulder ABduction: AROM;Both;10 reps;Supine Elbow Flexion: AROM;Both;10 reps;Supine Elbow Extension: AROM;Both;10 reps;Supine Digit Composite Flexion: AAROM;10 reps;Both;Supine Composite Extension: AROM;Both;10 reps;Supine   Shoulder Instructions       General Comments      Pertinent Vitals/ Pain       Pain Assessment: Faces Pain Score: 0-No pain Faces Pain Scale: Hurts a little bit Pain Location: during rolling, bed mobility Pain Descriptors / Indicators: Discomfort;Grimacing Pain Intervention(s): Limited activity within patient's tolerance;Monitored during session;Repositioned  Home Living                                          Prior Functioning/Environment              Frequency  Min 2X/week        Progress Toward Goals  OT Goals(current goals can now be found in the care plan section)  Progress towards OT goals: OT to reassess next treatment  Acute Rehab OT Goals Patient Stated Goal: unable to state  Plan Discharge plan remains appropriate;Frequency remains appropriate    Co-evaluation                 AM-PAC OT "6 Clicks" Daily Activity     Outcome Measure   Help from another person eating meals?: Total Help from another person taking care of personal grooming?: A Lot Help from another person toileting, which includes using toliet, bedpan, or urinal?: Total Help from another person bathing (including washing, rinsing, drying)?: Total Help from another person to put on and taking off regular upper body clothing?: Total Help from another person to put on and taking off regular lower body  clothing?: Total 6 Click Score: 7    End of Session Equipment Utilized During Treatment: Oxygen;Other (comment)(trach collar)  OT Visit Diagnosis: Other abnormalities of gait and mobility (R26.89);Muscle weakness (generalized) (M62.81);Other symptoms and signs involving cognitive function Pain - part of body: (generalized during bed mobility)   Activity Tolerance Patient limited by fatigue   Patient Left in bed;with call bell/phone within reach;with bed alarm set;with restraints reapplied   Nurse Communication          Time: [id number] OT Time Calculation (min): 16 min  Charges: OT General Charges $OT Visit: 1 Visit OT Treatments $Therapeutic Activity: 8-22 mins     Britt Bottom 07/04/2019, 11:27 AM

## 2019-07-04 NOTE — Evaluation (Signed)
[facility] - Evaluation Patient Details  Name: [name]. MRN: [id number] Date of Birth: [birth year]  Today's Date: 07/04/2019 Time: 1041-1105 SLP Time Calculation (min) (ACUTE ONLY): 24 min  Past Medical History:  Past Medical History:  Diagnosis Date  . Alcohol abuse   . Alcoholic ([place])   . Back pain   . CAP (community acquired pneumonia) 11/26/2018  . COPD (chronic obstructive pulmonary disease) ([place])   . Empyema of right pleural space ([place]) 11/26/2018  . Heavy smoker   . Hep C w/ coma, chronic ([place])   . Loose, teeth   . Pancreatitis   . Poor dentition   . Tobacco abuse    Past Surgical History:  Past Surgical History:  Procedure Laterality Date  . ARTERY REPAIR  2013   neck  . BIOPSY  02/19/2019   Procedure: BIOPSY;  Surgeon: Ronnette Juniper, MD;  Location[facility] ENDOSCOPY;  Service: Gastroenterology;;  . ESOPHAGOGASTRODUODENOSCOPY (EGD) WITH PROPOFOL N/A 02/19/2019   Procedure: ESOPHAGOGASTRODUODENOSCOPY (EGD) WITH PROPOFOL;  Surgeon: Ronnette Juniper, MD;  Location: [facility];  Service: Gastroenterology;  Laterality: N/A;  . FOREIGN BODY REMOVAL  02/19/2019   Procedure: FOREIGN BODY REMOVAL;  Surgeon: Ronnette Juniper, MD;  Location: [facility] ENDOSCOPY;  Service: Gastroenterology;;  . KNEE SURGERY  2013   rt  . MICROLARYNGOSCOPY N/A 07/31/2014   Procedure: MICROLARYNGOSCOPY WITH BIOPSY ;  Surgeon: Rozetta Nunnery, MD;  Location: [facility];  Service: ENT;  Laterality: N/A;  . NECK SURGERY  2013   cervical fusion-  . VASCULAR SURGERY    . VIDEO ASSISTED THORACOSCOPY (VATS)/EMPYEMA Right 11/26/2018   Procedure: VIDEO ASSISTED THORACOSCOPY (VATS)/EMPYEMA;  Surgeon: Rexene Alberts, MD;  Location: [place];  Service: Thoracic;  Laterality: Right;  [name] VIDEO BRONCHOSCOPY  11/26/2018   Procedure: Video Bronchoscopy;  Surgeon: Rexene Alberts, MD;  Location: [facility];  Service: Thoracic;;   HPI:  59 year old man with a history of alcohol use disorder, tobacco  use disorder, HTN, and recent admission on 04/10/2019 for pancreatitis and hyponatremia who presents for evaluation of shortness of breath and neck swelling with loss of airway and PEA arrest requiring emergent tracheostomy.   Assessment / Plan / Recommendation Clinical Impression  Pt remains lethargic but with less sedation on board today compared to yesterday per RN. He will open his eyes to name calling at times. PMV was placed on cuffless trach with audible secretions noted. He does not cough spontaneously nor volitionally to clear them, although per RT pt had a lot of secretions earlier today. He responded to SLP x1 ("what") with very low, hoarse phonation. PMV was left in place for 11 minutes with no overt backpressure or signs of intolerance, but was removed given level of lethargy. Recommend placing PMV for brief intervals with full staff supervision when he is more alert to encourage more interaction and attempts at secretion management. Would not leave it on for long periods of time or unsupervised at all given his mentation and level of secretions. He is not ready for a PO diet although of note, SLP does not have an order for swallow evaluation (if MD wishes to order, we could proceed with evaluation when he is more alert). Will continue to follow.  SLP Visit Diagnosis: Aphonia (R49.1)    SLP Assessment  Patient needs continued Speech Lanaguage Pathology Services    Follow Up Recommendations  Skilled Nursing facility    Frequency and Duration min 2x/week  2 weeks  PMSV Trial PMSV was placed for: 11 min Able to redirect subglottic air through upper airway: Yes Able to Attain Phonation: Yes Voice Quality: Hoarse;Low vocal intensity;Wet Able to Expectorate Secretions: No attempts Level of Secretion Expectoration with PMSV: Not observed Breath Support for Phonation: Inadequate Intelligibility: Unable to assess (comment) Respirations During Trial: 17 SpO2 During Trial: 98 % Pulse  During Trial: 92 Behavior: Lethargic   Tracheostomy Tube       Vent Dependency  FiO2 (%): 28 %    Cuff Deflation Trial  GO Tolerated Cuff Deflation: (cuffless)         Osie Bond., M.A. [facility] Pager [phone] Office [phone]  07/04/2019, 2:57 PM

## 2019-07-04 NOTE — Progress Notes (Signed)
Large amount of copious yellow, thick secretions. Lavaged patient two times and continued suctioning. Filled the suction container with 200 CC. A lot.

## 2019-07-04 NOTE — Progress Notes (Signed)
Nutrition Follow-up  DOCUMENTATION CODES:   Non-severe (moderate) malnutrition in context of chronic illness  INTERVENTION:   Continue TF via Cortrak tube:   Vital AF 1.2 increase to 65 ml/h (1560 ml per day)  Provides 1872 kcal, 117 gm protein, 1265 ml free water daily  -Continue free water flushes of 200 ml every 8 hours -Will continue to monitor for diet advancement  NUTRITION DIAGNOSIS:   Moderate Malnutrition related to chronic illness(COPD, alcoholism) as evidenced by mild fat depletion, moderate fat depletion, mild muscle depletion.  Ongoing.  GOAL:   Patient will meet greater than or equal to 90% of their needs  Meeting with TF.  MONITOR:   Diet advancement, Labs, Weight trends, TF tolerance, I & O's, Skin  ASSESSMENT:   59 yo male admitted with SOB, neck swelling, sore throat. Found to have H. flu in blood. S/P PEA arrest requiring emergent tracheostomy 1/4. PMH includes alcohol abuse, dysphagia, chronic hepatitis C, heavy smoker, COPD, poor dentition.  1/4: admitted, intubated d/t loss of airway and PEA arrest requiring emergent tracheostomy 1/5: Cortrak tube placed  **RD working remotely**  Patient continues on trach collar. Per SLP note today, recommends pt remain NPO given mentation/lethargy. PMV trials continue. Will monitor for diet advancement. Recommend continue Cortrak feeds until then.  Admission weight: 170 lbs. Current weight: 170 lbs.   I/Os: -15.3L since 1/12 UOP: 2.6L x 24 hrs  Medications: IV Folic acid, Thiamine tablet  Labs reviewed: CBGs: 94-131  Diet Order:   Diet Order            Diet NPO time specified  Diet effective now              EDUCATION NEEDS:   Not appropriate for education at this time  Skin:  Skin Assessment: (cellulitis to neck, chest, ankle, foot, toe)  Last BM:  1/26 -type 7  Height:   Ht Readings from Last 1 Encounters:  06/12/19 5\' 8"  (1.727 m)    Weight:   Wt Readings from Last 1  Encounters:  07/03/19 77.2 kg    Ideal Body Weight:  70 kg  BMI:  Body mass index is 25.88 kg/m.  Estimated Nutritional Needs:   Kcal:  1800-2000  Protein:  100-115 gm  Fluid:  >/= 1.8 L  Clayton Bibles, MS, RD, LDN Inpatient Clinical Dietitian Pager: [phone] After Hours Pager: [phone]

## 2019-07-04 NOTE — Progress Notes (Signed)
[facility] PROGRESS NOTE    Progress Note  [name].  [id number] DOB: 1960/06/17 DOA: 06/12/2019 PCP: Alvester Chou, NP     Brief Narrative:   William Dunster. is an 59 y.o. male past medical history of alcohol abuse and tobacco, essential hypertension recent discharge from the hospital for pancreatitis and hyponatremia who comes back to the ED for evaluation of shortness of breath and neck swelling he reports that he started about 5 days prior to admission and is progressively getting worst.  He was found tachycardic and tachypneic in the ED with difficulty breathing admitting to the ICU on 06/12/2019 with loss of airway and PEA arrest requiring emergent tracheostomy. CT of the neck soft tissue with contrast on 06/12/2019 showed left greater than right facial infrahyoid neck subcutaneous edema and inflammatory changes extending into the left anterior chest wall there is no fluid collection, but there is possible pharyngeal and supraglottic laryngeal wall edema.  CT chest with contrast on 06/12/2019 showed patchy airspace consolidation in the lingula and left lower lobe which favors multifocal pneumonia with soft tissue at the base of the neck extending superiorly.  Head CT 06/12/2019: Subtle loss of gray-white matter differentiation anterior right lentiform, which could represent acute/subacute infarct with moderate sized atrophy, likely reflect sequela of chronic microvascular ischemic changes.  MRI of the brain without contrast on 06/13/2019: Showed no acute abnormalities.  CT soft tissue of the neck without contrast 06/09/2019: Persistent left greater than right lower facial and infrahyoid neck subcutaneous edema and presumed inflammatory changes extending into anterior chest wall  CT chest without contrast 06/09/2019: Showed no subcutaneous soft tissue emphysema in the chest wall and the chest wall to suggest necrotizing fasciitis of the chest, mild scale thickening associated with  subcutaneous fat stranding.  With new small bilateral pleural effusions.  New mild interlobular septal thickening, new patchy groundglass opacities in both lungs predominantly in the upper lobes.  With new patchy centrilobular micronodularity in the right upper lobe.  Upper rib fractures bilaterally no pneumothorax.  MRI of the ankle 06/22/2019: No evidence of osteomyelitis or abscesses.  Some small ankle joint effusion, focal complete disruption of the anterior talofibular ligament with fluid and diffuse subcutaneous edema and cellulitis. ANTIBIOTICS:  Bactrim 1/4 Azithromycin 1/4  Vancomycin 1/4 Zosyn  1/4 one dose Clindamycin 1/4 one dose Unasyn 1/5 >> 1/23  LINES/TUBES:  Tracheostomy - placed 1/4 Peripheral IV in Right Forearm - placed 1/9 Peripheral IV in Left Forearm - placed 1/9 PICC line double lumen - placed 1/15  SIGNIFICANT EVENTS:  Admit to ICU on 1/4  1/4 : Lost airway and PEA arrest on, requiring emergent tracheostomy and ROSC obtained 1/5: 4/4 Blood cultures growing H.Influenza , narrowed to Unasyn 1/5 > 1/14: Continued on antibiotics without any significant improvement  1/14: New right foot swelling for which MRI obtained due to concerns for seeding of cellulitis from neck 1/15: MRI with synovitis and subcutaneous edema suggestive of cellulitis; orthopedics consulted for right ankle aspiration - aspirate with positive crystals suggestive of gout - patient started on NSAIDs and steroids 1/16 Started on dilaudid, D/C fentanyl  1/17: Patient agitated early AM, q2h prn versed prescribed 1/18: Hb 5.2, transfused 2u pRBC; started methadone  1/19: Klonopin increased to tid and metoprolol added for SVTs 1/20: precedex gtt discontinued overnight for concerns of bradycardia  1/21: clonidine taper, 24hr trach collar 1/22: continuing trach collar 1/23: last day of unasyn for cellulitis, steroid taper and colchicine for gout, continuing  trach collar  1/24: 72 hours of trach  collar   Assessment/Plan:   Severe soft tissue cellulitis/  Acute respiratory failure with hypoxemia (HCC)acute airway compromise status post emergent percutaneous tracheostomy/  Haemophilis influenzae bacteremia/  Multifocal pneumonia  : 4 out of 4 blood cultures grew H influenza beta-lactamase negative.  He has completed his course of IV Unasyn of 21 days on on 07/01/2019. He has tolerated trach collar size on 06/29/2019, and now has been changed out to a smaller size. Swallowing evaluation is pending for PMV.  Acute confusional state/alcohol use disorder: Too sleepy yesterday after he received Seroquel methadone and Ativan.  They were held. This morning he still sleepy but responding to command.  Intermittent SVT: Patient with intermittent SVT, continue clonidine taper and metoprolol scheduled.  Generalized edema/anasarca: He is negative about 11 L, will hold his Lasix for now. Creatinine is increased, continue intermittent basic metabolic panels.  Right upper extremity edema: Right upper extremity vascular ultrasound was done that showed superficial vein thrombosis: Continue Tylenol and warm complexes to the right upper extremity likely superficial thrombophlebitis.  Right ankle gout: He completed his course of steroid taper now resolved.  Electrolyte abnormalities: Now resolved.  Nutrition: Currently with an NG tube at 60 an hour. We will start him on free water per tube. Swallowing evaluation did not work with him yesterday as he was so sleepy.  Cardiac arrest, cause unspecified ([place])  Chronic hepatitis C without hepatic coma (HCC)   Malnutrition of moderate degree: Continue enteral nutrition awaiting swallowing evaluation. Malnutrition Type:  Nutrition Problem: Moderate Malnutrition Etiology: chronic illness(COPD, alcoholism)   Malnutrition Characteristics:  Signs/Symptoms: mild fat depletion, moderate fat depletion, mild muscle depletion   Nutrition  Interventions:  Interventions: Refer to RD note for recommendations    DVT prophylaxis: lovenox Family Communication:none Disposition Plan/Barrier to D/C: Patient came from home but will probably need to go to skilled nursing facility he has evaluated the patient recommended skilled after he is evaluated by speech and the diet is recommended. Code Status:  Code Status History    Date Active Date Inactive Code Status Order ID Comments User Context   05/01/2019 1558 05/05/2019 1835 Full Code [id number]  Daisy Floro, DO ED   11/26/2018 1438 12/04/2018 1726 Full Code [id number]  Lars Mage, MD ED   01/31/2017 1657 02/03/2017 1549 Full Code [id number]  Jamesetta So, MD ED   08/21/2016 1550 08/21/2016 2035 Full Code [id number]  Fredia Sorrow, MD ED   07/02/2012 2014 07/04/2012 1256 Full Code [id number]  Jesse Sans, RN ED   07/02/2012 1714 07/02/2012 2014 Full Code [id number]  Montine Circle, PA-C ED   03/08/2012 1225 03/08/2012 2108 Full Code [id number]  Leota Jacobsen, MD ED   Advance Care Planning Activity        IV Access:    Peripheral IV   Procedures and diagnostic studies:   DG Chest Port 1 View  Result Date: 07/03/2019 CLINICAL DATA:  Atelectasis EXAM: PORTABLE CHEST 1 VIEW COMPARISON:  06/25/2019 FINDINGS: Tracheostomy device is present. Enteric tube passes into the stomach with tip out of field of view. Right PICC line is unchanged. Persistent bilateral opacities. Probable bilateral effusions. Stable cardiomediastinal contours. IMPRESSION: Stable lines and tubes. Similar lung aeration with persistent bilateral opacities and probable bilateral pleural effusions. Electronically Signed   By: Macy Mis M.D.   On: 07/03/2019 09:40     Medical Consultants:    None.  Anti-Infectives:   None  Subjective:  William L Ranae Pugh. sleepy this morning but responds to command.  Objective:    Vitals:   07/03/19 2050 07/03/19 2241 07/04/19 0017 07/04/19 0422  BP:   134/70 126/72 128/65  Pulse:  (!) 111 86 85  Resp:   15 15  Temp: 98.5 F (36.9 C)  98.4 F (36.9 C) 98.6 F (37 C)  TempSrc: Oral  Oral Axillary  SpO2:   98% 97%  Weight:      Height:       SpO2: 97 % O2 Flow Rate (L/min): 5 L/min FiO2 (%): 28 %   Intake/Output Summary (Last 24 hours) at 07/04/2019 0738 Last data filed at 07/04/2019 0525 Gross per 24 hour  Intake 850.61 ml  Output 2800 ml  Net -1949.39 ml   Filed Weights   07/01/19 0500 07/02/19 0500 07/03/19 0402  Weight: 75.1 kg 75.1 kg 77.2 kg    Exam: General exam: In no acute distress, sleepy Respiratory system: Good air movement and clear to auscultation. Cardiovascular system: S1 & S2 heard, RRR. No JVD. Gastrointestinal system: Abdomen is nondistended, soft and nontender.  Extremities: No pedal edema. Skin: No rashes, lesions or ulcers  Data Reviewed:    Labs: Basic Metabolic Panel: Recent Labs  Lab 06/28/19 2018 06/29/19 0411 06/30/19 [id number] 06/30/19 [id number] 07/01/19 [id number] 07/01/19 0514 07/02/19 0403 07/02/19 0403 07/03/19 0433 07/04/19 0545  NA 142   < > 143  --  141  --  139  --  136 138  K 3.6   < > 3.3*   < > 4.0   < > 4.1   < > 4.3 4.4  CL 99   < > 107  --  100  --  97*  --  94* 96*  CO2 34*   < > 28  --  31  --  31  --  31 32  GLUCOSE 109*   < > 86  --  111*  --  103*  --  106* 116*  BUN 11   < > 10  --  16  --  16  --  19 17  CREATININE 0.63   < > 0.63  --  0.68  --  0.59*  --  0.94 0.62  CALCIUM 8.7*   < > 7.6*  --  8.9  --  9.2  --  9.3 9.5  MG 2.0  --   --   --  2.0  --   --   --   --   --   PHOS 5.1*  --   --   --   --   --   --   --   --   --    < > = values in this interval not displayed.   GFR Estimated Creatinine Clearance: 98.6 mL/min (by C-G formula based on SCr of 0.62 mg/dL). Liver Function Tests: Recent Labs  Lab 06/28/19 0419  AST 15  ALT 16  ALKPHOS 125  BILITOT 0.6  PROT 5.8*  ALBUMIN 1.8*   No results for input(s): LIPASE, AMYLASE in the last 168 hours. No  results for input(s): AMMONIA in the last 168 hours. Coagulation profile No results for input(s): INR, PROTIME in the last 168 hours. COVID-19 Labs  No results for input(s): DDIMER, FERRITIN, LDH, CRP in the last 72 hours.  Lab Results  Component Value Date   SARSCOV2NAA NEGATIVE 06/12/2019   [place] NEGATIVE 05/01/2019   SARSCOV2NAA NOT DETECTED 04/14/2019   [place] Not Detected  03/30/2019    CBC: Recent Labs  Lab 06/29/19 0411 06/30/19 0318 07/01/19 0514 07/02/19 0403 07/03/19 0433  WBC 6.9 6.5 7.7 7.5 8.4  HGB 9.5* 8.0* 10.9* 11.3* 11.8*  HCT 33.0* 27.7* 37.7* 38.9* 40.4  MCV 85.7 86.0 85.9 85.5 85.6  PLT 560* 455* 573* 521* 504*   Cardiac Enzymes: No results for input(s): CKTOTAL, CKMB, CKMBINDEX, TROPONINI in the last 168 hours. BNP (last 3 results) No results for input(s): PROBNP in the last 8760 hours. CBG: Recent Labs  Lab 07/03/19 1706 07/03/19 2052 07/04/19 0020 07/04/19 0421 07/04/19 0733  GLUCAP 97 99 127* 99 94   D-Dimer: No results for input(s): DDIMER in the last 72 hours. Hgb A1c: No results for input(s): HGBA1C in the last 72 hours. Lipid Profile: No results for input(s): CHOL, HDL, LDLCALC, TRIG, CHOLHDL, LDLDIRECT in the last 72 hours. Thyroid function studies: No results for input(s): TSH, T4TOTAL, T3FREE, THYROIDAB in the last 72 hours.  Invalid input(s): FREET3 Anemia work up: No results for input(s): VITAMINB12, FOLATE, FERRITIN, TIBC, IRON, RETICCTPCT in the last 72 hours. Sepsis Labs: Recent Labs  Lab 06/30/19 0318 07/01/19 0514 07/02/19 0403 07/03/19 0433  WBC 6.5 7.7 7.5 8.4   Microbiology No results found for this or any previous visit (from the past 240 hour(s)).   Medications:   . chlorhexidine  15 mL Mouth Rinse BID  . Chlorhexidine Gluconate Cloth  6 each Topical Daily  . clonazepam  1 mg Per Tube TID  . famotidine  20 mg Per Tube BID  . folic acid  1 mg Intravenous Daily  . free water  200 mL Per Tube  Q8H  . gabapentin  900 mg Per Tube TID  . guaiFENesin  15 mL Per Tube Q6H  . heparin injection (subcutaneous)  5,000 Units Subcutaneous Q8H  . insulin aspart  0-15 Units Subcutaneous Q4H  . mouth rinse  15 mL Mouth Rinse q12n4p  . methadone  10 mg Per Tube TID  . metoprolol tartrate  50 mg Per Tube BID  . nicotine  21 mg Transdermal Daily  . polyethylene glycol  17 g Per Tube Daily  . QUEtiapine  200 mg Per Tube BID  . sodium chloride flush  10-40 mL Intracatheter Q12H  . thiamine  100 mg Per Tube Daily   Continuous Infusions: . sodium chloride Stopped (07/02/19 1610)  . feeding supplement (VITAL AF 1.2 CAL) 1,000 mL (07/03/19 1834)      LOS: 22 days   Charlynne Cousins  [facility]  07/04/2019, 7:38 AM

## 2019-07-04 NOTE — Progress Notes (Signed)
Dr. Aileen Fass notified of patient's increased LOC, now responding to questions and more alert.  Patient denies pain. Not pulling on lines at this time.  Will hold 1600 hr methadone and Klonopin and continue to monitor LOC.  Also notified of patient's inability to urinate, bladder scan with 430cc at this time. Patient has been in and out catheterized x 3 since Foley removed.  Order to replace foley at this time.

## 2019-07-04 NOTE — Progress Notes (Signed)
Patient unable to void since last in and out catheterization, bladder scan indicates 497 cc in bladder.  Patient in and out catheterized for 600 cc of clear yellow urine.  Will continue to monitor output per post foley removal protocol.

## 2019-07-05 LAB — GLUCOSE, CAPILLARY
Glucose-Capillary: 112 mg/dL — ABNORMAL HIGH (ref 70–99)
Glucose-Capillary: 128 mg/dL — ABNORMAL HIGH (ref 70–99)
Glucose-Capillary: 136 mg/dL — ABNORMAL HIGH (ref 70–99)
Glucose-Capillary: 140 mg/dL — ABNORMAL HIGH (ref 70–99)
Glucose-Capillary: 82 mg/dL (ref 70–99)
Glucose-Capillary: 88 mg/dL (ref 70–99)

## 2019-07-05 LAB — BASIC METABOLIC PANEL
Anion gap: 10 (ref 5–15)
BUN: 18 mg/dL (ref 6–20)
CO2: 32 mmol/L (ref 22–32)
Calcium: 9.5 mg/dL (ref 8.9–10.3)
Chloride: 94 mmol/L — ABNORMAL LOW (ref 98–111)
Creatinine, Ser: 0.67 mg/dL (ref 0.61–1.24)
GFR calc Af Amer: >60 mL/min (ref >60)
GFR calc non Af Amer: >60 mL/min (ref >60)
Glucose, Bld: 115 mg/dL — ABNORMAL HIGH (ref 70–99)
Potassium: 4.5 mmol/L (ref 3.5–5.1)
Sodium: 136 mmol/L (ref 135–145)

## 2019-07-05 LAB — BRAIN NATRIURETIC PEPTIDE: B Natriuretic Peptide: 48.7 pg/mL (ref 0.0–100.0)

## 2019-07-05 MED ORDER — SODIUM CHLORIDE 3 % IN NEBU
4.0000 mL | INHALATION_SOLUTION | RESPIRATORY_TRACT | Status: AC
  Administered 2019-07-06: 4 mL via RESPIRATORY_TRACT
  Filled 2019-07-05: qty 4

## 2019-07-05 MED ORDER — FUROSEMIDE 10 MG/ML IJ SOLN: 40.0000 mg | Freq: Once | INTRAMUSCULAR | Status: DC

## 2019-07-05 MED ORDER — FUROSEMIDE 10 MG/ML IJ SOLN
40.0000 mg | Freq: Four times a day (QID) | INTRAMUSCULAR | Status: DC
  Administered 2019-07-05: 40 mg via INTRAVENOUS
  Filled 2019-07-05: qty 4

## 2019-07-05 MED ORDER — IPRATROPIUM-ALBUTEROL 0.5-2.5 (3) MG/3ML IN SOLN
3.0000 mL | Freq: Four times a day (QID) | RESPIRATORY_TRACT | Status: DC
  Administered 2019-07-05: 3 mL via RESPIRATORY_TRACT
  Filled 2019-07-05: qty 3

## 2019-07-05 MED ORDER — CLONAZEPAM 0.25 MG PO TBDP: 0.5000 mg | ORAL_TABLET | Freq: Three times a day (TID) | ORAL | Status: DC

## 2019-07-05 MED ORDER — METHADONE HCL 10 MG/ML IJ SOLN: 10.0000 mg | Freq: Four times a day (QID) | INTRAMUSCULAR | Status: DC

## 2019-07-05 MED ORDER — LORAZEPAM 2 MG/ML IJ SOLN
1.0000 mg | Freq: Three times a day (TID) | INTRAMUSCULAR | Status: DC | PRN
  Administered 2019-07-05 – 2019-07-06 (×2): 1 mg via INTRAVENOUS
  Filled 2019-07-05 (×3): qty 1

## 2019-07-05 MED ORDER — IPRATROPIUM-ALBUTEROL 0.5-2.5 (3) MG/3ML IN SOLN
3.0000 mL | Freq: Four times a day (QID) | RESPIRATORY_TRACT | Status: DC
  Administered 2019-07-05 – 2019-07-07 (×7): 3 mL via RESPIRATORY_TRACT
  Filled 2019-07-05 (×7): qty 3

## 2019-07-05 NOTE — Progress Notes (Signed)
Pt removed coretrak feeding tube. Unable to give meds PO or "per tube". Attending MD notified & orders were placed for PRN q8h IV lorazepam.

## 2019-07-05 NOTE — Progress Notes (Signed)
Physical Therapy Treatment Patient Details Name: [name]. MRN: [id number] DOB: 1961/03/04 Today's Date: 07/05/2019    History of Present Illness 59 year old man with a history of alcohol use disorder, tobacco used disorder, HTN, and recent admission on 04/10/2019 for pancreatitis and hyponatremia who presents for evaluation of shortness of breath and neck swelling.     PT Comments    Patient received in bed, much more alert today and able to interact well with PT; hypophonic and hard to understand, tried writing but unable to read handwriting. Levels of assist with mobility as below. With posterior lean at EOB but this faded from maxA to min guard with extended time. Tolerated sitting at EOB approximately 3-4 minutes, attempted seated exercises at EOB but he did not follow cues, preferring to lay back down with min guardx2 and assist for line management. He was positioned to comfort with all needs met, bed alarm active and mitts reapplied per RN request due to increased restlessness after activity.    Follow Up Recommendations  SNF;Supervision for mobility/OOB     Equipment Recommendations  None recommended by PT    Recommendations for Other Services       Precautions / Restrictions Precautions Precautions: Fall Precaution Comments: trach collar, bilat mittens; posey wrist restraints sometimes on, sometimes off depending on restlessness/pulling at lines Restrictions Weight Bearing Restrictions: No    Mobility  Bed Mobility Overal bed mobility: Needs Assistance Bed Mobility: Supine to Sit;Sit to Supine     Supine to sit: Max assist;+2 for physical assistance;HOB elevated Sit to supine: +2 for physical assistance;HOB elevated;Min guard   General bed mobility comments: MaxAx2 to get to EOB; initially MaxA to maintain upright due to posterior lean and faded to min guard. HR to 122BPM. Ultimately laid himself back down with min guard for safety and line  management  Transfers                 General transfer comment: deferred-  HR to 122, refused to stand  Ambulation/Gait             General Gait Details: deferred- HR to 122BPM, refused   Stairs             Wheelchair Mobility    Modified Rankin (Stroke Patients Only)       Balance Overall balance assessment: Needs assistance Sitting-balance support: No upper extremity supported;Feet unsupported Sitting balance-Leahy Scale: Zero Sitting balance - Comments: totalA to maintain balance at EOB due to lethargy- multidirectional balance loss Postural control: Posterior lean                                  Cognition Arousal/Alertness: Awake/alert Behavior During Therapy: Flat affect Overall Cognitive Status: Impaired/Different from baseline Area of Impairment: Attention;Following commands;Awareness                   Current Attention Level: Sustained   Following Commands: Follows one step commands inconsistently;Follows one step commands with increased time   Awareness: Intellectual   General Comments: more alert today but still with issues with command following; hypophonic; becomes restless when moved around so put mitts back on at EOS per RN request      Exercises      General Comments General comments (skin integrity, edema, etc.): HR to 122BPM, otherwise VSS on trach collar      Pertinent Vitals/Pain Pain Assessment: Faces Pain Score: 0-No pain  Faces Pain Scale: No hurt Pain Intervention(s): Limited activity within patient's tolerance;Monitored during session    Home Living                      Prior Function            PT Goals (current goals can now be found in the care plan section) Acute Rehab PT Goals Patient Stated Goal: unable to state PT Goal Formulation: Patient unable to participate in goal setting Potential to Achieve Goals: Fair Progress towards PT goals: Progressing toward goals     Frequency    Min 2X/week      PT Plan Current plan remains appropriate    Co-evaluation              AM-PAC PT "6 Clicks" Mobility   Outcome Measure  Help needed turning from your back to your side while in a flat bed without using bedrails?: A Lot Help needed moving from lying on your back to sitting on the side of a flat bed without using bedrails?: A Lot Help needed moving to and from a bed to a chair (including a wheelchair)?: Total Help needed standing up from a chair using your arms (e.g., wheelchair or bedside chair)?: Total Help needed to walk in hospital room?: Total Help needed climbing 3-5 steps with a railing? : Total 6 Click Score: 8    End of Session Equipment Utilized During Treatment: Oxygen Activity Tolerance: Patient tolerated treatment well Patient left: in bed;with call bell/phone within reach;with restraints reapplied;with bed alarm set Nurse Communication: Mobility status;Other (comment)(increased restlessness/pulling at lines at EOS) PT Visit Diagnosis: Muscle weakness (generalized) (M62.81);Difficulty in walking, not elsewhere classified (R26.2) Pain - part of body: (abdomen)     Time: 1916-6060 PT Time Calculation (min) (ACUTE ONLY): 23 min  Charges:  $Therapeutic Activity: 23-37 mins                     Windell Norfolk, DPT, PN1   Supplemental Physical Therapist [place]    Pager [phone] Acute Rehab Office [phone]

## 2019-07-05 NOTE — Progress Notes (Signed)
PULMONARY / CRITICAL CARE MEDICINE  NAME:  [name]., MRN:  [id number], DOB:  February 18, 1961, LOS: 61 ADMISSION DATE:  06/12/2019, CONSULTATION DATE:  06/12/2019 REFERRING MD:  Alinda Sierras, CHIEF COMPLAINT:  Shortness of breath, sore throat, and neck swelling  BRIEF HISTORY:    59 year old man with a history of alcohol use disorder, tobacco used disorder, HTN, and recent admission on 04/10/2019 for pancreatitis and hyponatremia who presents for evaluation of shortness of breath and neck swelling. Reported  to have started 5 days ago and progressively gotten worse. Patient tachycardic to 160's and tachypnic to the 40's in the ED. Seen in ED, patient says he has neck swelling , cough, and difficulty breathing. Admitted to ICU on 1/4 with loss of airway and PEA arrest requiring emergent tracheostomy. Patient has been difficult to wean off sedation   SIGNIFICANT PAST MEDICAL HISTORY    Past Medical History:  Diagnosis Date  . Alcohol abuse   . Alcoholic ([place])   . Back pain   . CAP (community acquired pneumonia) 11/26/2018  . COPD (chronic obstructive pulmonary disease) ([place])   . Empyema of right pleural space ([place]) 11/26/2018  . Heavy smoker   . Hep C w/ coma, chronic ([place])   . Loose, teeth   . Pancreatitis   . Poor dentition   . Tobacco abuse    SIGNIFICANT EVENTS:  Admit to ICU on 1/4  1/4 : Lost airway and PEA arrest on, requiring emergent tracheostomy and ROSC obtained 1/5: 4/4 Blood cultures growing H.Influenza , narrowed to Unasyn 1/5 > 1/14: Continued on antibiotics without any significant improvement  1/14: New right foot swelling for which MRI obtained due to concerns for seeding of cellulitis from neck 1/15: MRI with synovitis and subcutaneous edema suggestive of cellulitis; orthopedics consulted for right ankle aspiration - aspirate with positive crystals suggestive of gout - patient started on NSAIDs and steroids 1/16 Started on dilaudid, D/C fentanyl  1/17: Patient agitated  early AM, q2h prn versed prescribed 1/18: Hb 5.2, transfused 2u pRBC; started methadone  1/19: Klonopin increased to tid and metoprolol added for SVTs 1/20: precedex gtt discontinued overnight for concerns of bradycardia  1/21: clonidine taper, 24hr trach collar 1/22: continuing trach collar 1/23: last day of unasyn for cellulitis, steroid taper and colchicine for gout, continuing trach collar  1/24: 72 hours of trach collar 1/25 changed to 4 cuffless 1/26 sedation cut back STUDIES:    CT Soft Tissue Neck W/ Contrast (1/4)  Left greater than right lower facial and infrahyoid neck subcutaneous edema and inflammatory changes extending into the left anterior chest wall. There is no collection. Possible pharyngeal and supraglottic laryngeal wall edema, which could be accentuated by artifact. There is mild thickening of the epiglottis. Airway remains patent. No evidence of peritonsillar abscess.   CT Chest W Contrast (1/4) Patchy airspace consolidations within the lingula and dependent left lower lobe, with some volume loss within the right lung base. Findings are favored to represent multifocal pneumonia.  Induration within the soft tissues at the base of the neck extending into the superior aspect of the left chest wall with associated skin thickening, which may reflect cellulitis.   CT Head (1/4) Subtle loss of gray-white differentiation in the anterior right lentiform insular ribbon. This could represent an acute/subacute infarct. Moderate generalized atrophy and diffuse white matter disease. This likely reflects the sequela of chronic microvascular ischemia.  MRI Brain WO Contrast (1/5) No acute intracranial abnormality.  CT Soft Tissue Neck wo Contrast (  1/11) Persistent but slightly improved left greater than right lower facial and infrahyoid neck subcutaneous edema and presumed inflammatory changes extending into the anterior chest wall. No evidence of developing  collection  CT Chest wo Contrast (1/11) No subcutaneous or soft tissue emphysema in the chest wall to suggest necrotizing fasciitis in the chest. Stable mild skin thickening with associated subcutaneous fat stranding in the high ventral left chest wall. New small dependent bilateral pleural effusions. New mild interlobular septal thickening. New patchy ground-glass opacities in both lungs predominantly in the upper lobes. New patchy centrilobular micronodularity in the right upper lobe. Acute anterior upper rib fractures bilaterally as detailed. No pneumothorax.  MR RIGHT ANKLE (1/14) No evidence of osteomyelitis, or soft tissue abscess. Mildly enhancing small ankle joint effusion which could be due to synovitis.Focal interstitial tear with posterior tibialis tenosynovitis. Mild flexor digitorum and flexor hallucis longus tenosynovitis.Focal complete disruption of the anterior talofibular ligament with fluid in the syndesmosis.Diffuse subcutaneous edema and cellulitis.  CULTURES:  Influenza A : Negative Influenza B : Negtive Sars Coronavirus: Negative MRSA Nasopharyngeal negative 1/4 Blood cultures: 4/4 Positive for H. Influenza  Urine culture: No growth 1/6 Blood cultures: NGTD on 1/11 1/13 Tracheal aspirate: few klebsiella pneumoniae  1/16 R ankle aspirate : monosodium urate crystals, no organisms on gram stain; culture w/ no growth to date 1/27: Sputum culture>>> ANTIBIOTICS:  Bactrim 1/4 Azithromycin 1/4  Vancomycin 1/4 Zosyn  1/4 one dose Clindamycin 1/4 one dose Unasyn 1/5 >> 1/23   LINES/TUBES:  Tracheostomy - placed 1/4 Peripheral IV in Right Forearm - placed 1/9 Peripheral IV in Left Forearm - placed 1/9 PICC line double lumen - placed 1/15  CONSULTANTS:  Infectious Disease  ENT  Orthopedic surgery  SUBJECTIVE:  Doing okay, still some periods of agitation.   Calm for me this AM.  Coughing and lots of yellow sputum per nursing  CONSTITUTIONAL: BP 133/77    Pulse 100   Temp 98.2 F (36.8 C) (Oral)   Resp 15   Ht 5\' 8"  (1.727 m)   Wt 73 kg   SpO2 99%   BMI 24.47 kg/m   I/O last 3 completed shifts: In: 2015 [I.V.:40; NG/GT:1975] Out: 4000 [Urine:4000]   FiO2 (%):  [28 %] 28 %   PHYSICAL EXAM: GEN: no acute distress HEENT: 4 cuffless in place, no secretions at present, on TC, talks with PMV without issue CV:  RRR, ext warm PULM: Scattered, rhonci, no accessory muscle use GI: Soft, +BS EXT: No edema NEURO: Moves all 4 ext to command PSYCH: RASS 0 SKIN: No rashes   ASSESSMENT AND PLAN   Severe Soft Tissue Cellulitis of left neck resulting in Acute Airway compromise s/p emergent percutaneous tracheostomy->completed 21 days Unasyn H. Influenza Bacteremia 4/4 bottles  Agitation/ TME superimposed on Alcohol Use Disorder Intermittent SVT Generalized edema/anasarca: RUE Superficial thrombophlebitis:  R Ankle Gout (flare resolved w/ pred taper and colchicine 1/23)  Intermittent electrolyte abnormalities    Pulmonary and associated problem List Tracheostomy dependence s/p upper airway obstruction from left neck cellulitis.  Diffuse bilateral airspace disease (treated aspiration) Rule out HCAP versus recurrent aspiration 1/27 Left basilar atelectasis Residual acute metabolic encephalopathy  Continue TC Add CPT and hypertonic nebs Lasix x 1 dose Continue to wean benzodiazepines PMV with supervision Once secretions under better control, can do cap trial  Erskine Emery MD PCCM

## 2019-07-05 NOTE — Progress Notes (Addendum)
[facility] PROGRESS NOTE    Progress Note  [name].  [id number] DOB: May 23, 1961 DOA: 06/12/2019 PCP: Alvester Chou, NP     Brief Narrative:   [name]. is an 59 y.o. male with past medical history of alcohol abuse and tobacco, essential hypertension and recent discharge from the hospital for pancreatitis and hyponatremia who comes back to the ED for evaluation of shortness of breath and neck swelling he reports that he started about 5 days prior to admission and is progressively getting worst.  He was found tachycardic and tachypneic in the ED with difficulty breathing admitting to the ICU on 06/12/2019 with loss of airway and PEA arrest requiring emergent tracheostomy. CT of the neck soft tissue with contrast on 06/12/2019 showed left greater than right facial infrahyoid neck subcutaneous edema and inflammatory changes extending into the left anterior chest wall there is no fluid collection, but there is possible pharyngeal and supraglottic laryngeal wall edema.  CT chest with contrast on 06/12/2019 showed patchy airspace consolidation in the lingula and left lower lobe which favors multifocal pneumonia with soft tissue at the base of the neck extending superiorly.  Head CT 06/12/2019: Subtle loss of gray-white matter differentiation anterior right lentiform, which could represent acute/subacute infarct with moderate sized atrophy, likely reflect sequela of chronic microvascular ischemic changes.  MRI of the brain without contrast on 06/13/2019: Showed no acute abnormalities.  CT soft tissue of the neck without contrast 06/09/2019: Persistent left greater than right lower facial and infrahyoid neck subcutaneous edema and presumed inflammatory changes extending into anterior chest wall  CT chest without contrast 06/09/2019: Showed no subcutaneous soft tissue emphysema in the chest wall and the chest wall to suggest necrotizing fasciitis of the chest, mild scale thickening  associated with subcutaneous fat stranding.  With new small bilateral pleural effusions.  New mild interlobular septal thickening, new patchy groundglass opacities in both lungs predominantly in the upper lobes.  With new patchy centrilobular micronodularity in the right upper lobe.  Upper rib fractures bilaterally no pneumothorax.  MRI of the ankle 06/22/2019: No evidence of osteomyelitis or abscesses.  Some small ankle joint effusion, focal complete disruption of the anterior talofibular ligament with fluid and diffuse subcutaneous edema and cellulitis. ANTIBIOTICS:  Bactrim 1/4 Azithromycin 1/4  Vancomycin 1/4 Zosyn  1/4 one dose Clindamycin 1/4 one dose Unasyn 1/5 >> 1/23  LINES/TUBES:  Tracheostomy - placed 1/4 Peripheral IV in Right Forearm - placed 1/9 Peripheral IV in Left Forearm - placed 1/9 PICC line double lumen - placed 1/15  SIGNIFICANT EVENTS:  Admit to ICU on 1/4  1/4 : Lost airway and PEA arrest on, requiring emergent tracheostomy and ROSC obtained 1/5: 4/4 Blood cultures growing H.Influenza , narrowed to Unasyn 1/5 > 1/14: Continued on antibiotics without any significant improvement  1/14: New right foot swelling for which MRI obtained due to concerns for seeding of cellulitis from neck 1/15: MRI with synovitis and subcutaneous edema suggestive of cellulitis; orthopedics consulted for right ankle aspiration - aspirate with positive crystals suggestive of gout - patient started on NSAIDs and steroids 1/16 Started on dilaudid, D/C fentanyl  1/17: Patient agitated early AM, q2h prn versed prescribed 1/18: Hb 5.2, transfused 2u pRBC; started methadone  1/19: Klonopin increased to tid and metoprolol added for SVTs 1/20: precedex gtt discontinued overnight for concerns of bradycardia  1/21: clonidine taper, 24hr trach collar 1/22: continuing trach collar 1/23: last day of unasyn for cellulitis, steroid taper and colchicine for  gout, continuing trach collar  1/24: 72 hours  of trach collar   Assessment/Plan:   Severe soft tissue cellulitis/Acute respiratory failure with hypoxemia (HCC)acute airway compromise status post emergent percutaneous tracheostomy/Haemophilis influenzae bacteremia/Multifocal pneumonia: 4 out of 4 blood cultures grew H influenza beta-lactamase negative; completed course of IV Unasyn of 21 days on on 07/01/2019. He has tolerated trach collar size on 06/29/2019, and now has been changed out to a smaller size. Swallowing evaluation ordered today as recommended by Speech. Barium swallow study to take place 1/28. Per Speech today; keep NPO for now. Patient pulled out feeding tube, will replace. Has not been combative and he is overall following commands. Will replace mitts.  CCM following for trach management/recs; Once secretions under better control, can do cap trial Sputum cx ordered today  Acute confusional state/alcohol use disorder: (resolving) Alert this morning and following commands; attempting to communicate although difficult to understand.  Seroquel 200 mg BID, Methadone 10 mg TID, Klonopin 0.5 mg TID Cont to wean Benzos as able   Intermittent SVT: Patient with intermittent SVT, cont Metroprolol 50 mg BID  Generalized edema/anasarca: He is net negative for admission, cont to hold Lasix Cr now normal and at baseline BNP 48 on 1/27 Wt Readings from Last 3 Encounters:  07/05/19 73 kg  05/01/19 66.2 kg  04/18/19 64.5 kg   Right upper extremity edema: (resolving) Right upper extremity vascular ultrasound was done that showed superficial vein thrombosis: Continue Tylenol and warm complexes to the right upper extremity likely superficial thrombophlebitis.  Right ankle gout: He completed his course of steroid taper now resolved.  Electrolyte abnormalities: Now resolved.  Nutrition: Currently with an NG tube at 60 an hour. We will start him on free water per tube. Swallowing evaluation did not work with him yesterday as he  was so sleepy.  Cardiac arrest, cause unspecified ([place])  Chronic hepatitis C without hepatic coma (HCC)   Malnutrition of moderate degree: Continue enteral nutrition awaiting swallowing evaluation. Malnutrition Type:  Nutrition Problem: Moderate Malnutrition Etiology: chronic illness(COPD, alcoholism)   Malnutrition Characteristics:  Signs/Symptoms: mild fat depletion, moderate fat depletion, mild muscle depletion   Nutrition Interventions:  Interventions: Tube feeding    DVT prophylaxis: lovenox Family Communication:none Disposition Plan/Barrier to D/C: Patient came from home but will probably need to go to skilled nursing facility he has evaluated the patient recommended skilled after he is evaluated by speech and the diet is recommended. Code Status:  Code Status History    Date Active Date Inactive Code Status Order ID Comments User Context   05/01/2019 1558 05/05/2019 1835 Full Code [id number]  Daisy Floro, DO ED   11/26/2018 1438 12/04/2018 1726 Full Code [id number]  Lars Mage, MD ED   01/31/2017 1657 02/03/2017 1549 Full Code [id number]  Jamesetta So, MD ED   08/21/2016 1550 08/21/2016 2035 Full Code [id number]  Fredia Sorrow, MD ED   07/02/2012 2014 07/04/2012 1256 Full Code [id number]  Jesse Sans, RN ED   07/02/2012 1714 07/02/2012 2014 Full Code [id number]  Montine Circle, PA-C ED   03/08/2012 1225 03/08/2012 2108 Full Code [id number]  Leota Jacobsen, MD ED   Advance Care Planning Activity        IV Access:    Peripheral IV   Procedures and diagnostic studies:   No results found.   Medical Consultants:    None.  Anti-Infectives:   None  Subjective:    [name]. sleepy this morning but responds to command.  Objective:    Vitals:   07/05/19 0848 07/05/19 1029 07/05/19 1119 07/05/19 1136  BP:  133/77  133/77  Pulse:  (!) 123 (!) 104 100  Resp: 17   15  Temp:      TempSrc:      SpO2:    99%  Weight:      Height:        SpO2: 99 % O2 Flow Rate (L/min): 5 L/min FiO2 (%): 28 %   Intake/Output Summary (Last 24 hours) at 07/05/2019 1317 Last data filed at 07/05/2019 1103 Gross per 24 hour  Intake 1515 ml  Output 3250 ml  Net -1735 ml   Filed Weights   07/02/19 0500 07/03/19 0402 07/05/19 0439  Weight: 75.1 kg 77.2 kg 73 kg    Exam: General exam: In no acute distress, more alert and attempting to speak and communicate; able to nod yes and no to questions Respiratory system: Coarse rhonchi bilaterally. No increased work of breathing. Cardiovascular system: S1 & S2 heard, RRR. No JVD. Gastrointestinal system: Abdomen is nondistended, soft and nontender.  Extremities: No pedal edema. Skin: No rashes, lesions or ulcers  Data Reviewed:    Labs: Basic Metabolic Panel: Recent Labs  Lab 06/28/19 2018 06/29/19 0411 07/01/19 [id number] 07/01/19 [id number] 07/02/19 0403 07/02/19 0403 07/03/19 [id number] 07/03/19 0433 07/04/19 0545 07/05/19 0424  NA 142   < > 141  --  139  --  136  --  138 136  K 3.6   < > 4.0   < > 4.1   < > 4.3   < > 4.4 4.5  CL 99   < > 100  --  97*  --  94*  --  96* 94*  CO2 34*   < > 31  --  31  --  31  --  32 32  GLUCOSE 109*   < > 111*  --  103*  --  106*  --  116* 115*  BUN 11   < > 16  --  16  --  19  --  17 18  CREATININE 0.63   < > 0.68  --  0.59*  --  0.94  --  0.62 0.67  CALCIUM 8.7*   < > 8.9  --  9.2  --  9.3  --  9.5 9.5  MG 2.0  --  2.0  --   --   --   --   --   --   --   PHOS 5.1*  --   --   --   --   --   --   --   --   --    < > = values in this interval not displayed.   GFR Estimated Creatinine Clearance: 98.6 mL/min (by C-G formula based on SCr of 0.67 mg/dL). Liver Function Tests: No results for input(s): AST, ALT, ALKPHOS, BILITOT, PROT, ALBUMIN in the last 168 hours. No results for input(s): LIPASE, AMYLASE in the last 168 hours. No results for input(s): AMMONIA in the last 168 hours. Coagulation profile No results for input(s): INR, PROTIME in the last 168  hours. COVID-19 Labs  No results for input(s): DDIMER, FERRITIN, LDH, CRP in the last 72 hours.  Lab Results  Component Value Date   [place] NEGATIVE 06/12/2019   [place] NEGATIVE 05/01/2019   SARSCOV2NAA NOT DETECTED 04/14/2019   [place] Not Detected 03/30/2019    CBC: Recent Labs  Lab 06/29/19 0411 06/30/19 0318 07/01/19 [id number]  07/02/19 0403 07/03/19 0433  WBC 6.9 6.5 7.7 7.5 8.4  HGB 9.5* 8.0* 10.9* 11.3* 11.8*  HCT 33.0* 27.7* 37.7* 38.9* 40.4  MCV 85.7 86.0 85.9 85.5 85.6  PLT 560* 455* 573* 521* 504*   Cardiac Enzymes: No results for input(s): CKTOTAL, CKMB, CKMBINDEX, TROPONINI in the last 168 hours. BNP (last 3 results) No results for input(s): PROBNP in the last 8760 hours. CBG: Recent Labs  Lab 07/04/19 1941 07/05/19 0009 07/05/19 0349 07/05/19 0748 07/05/19 1107  GLUCAP 121* 140* 128* 112* 136*   D-Dimer: No results for input(s): DDIMER in the last 72 hours. Hgb A1c: No results for input(s): HGBA1C in the last 72 hours. Lipid Profile: No results for input(s): CHOL, HDL, LDLCALC, TRIG, CHOLHDL, LDLDIRECT in the last 72 hours. Thyroid function studies: No results for input(s): TSH, T4TOTAL, T3FREE, THYROIDAB in the last 72 hours.  Invalid input(s): FREET3 Anemia work up: No results for input(s): VITAMINB12, FOLATE, FERRITIN, TIBC, IRON, RETICCTPCT in the last 72 hours. Sepsis Labs: Recent Labs  Lab 06/30/19 0318 07/01/19 0514 07/02/19 0403 07/03/19 0433  WBC 6.5 7.7 7.5 8.4   Microbiology No results found for this or any previous visit (from the past 240 hour(s)).   Medications:   . chlorhexidine  15 mL Mouth Rinse BID  . Chlorhexidine Gluconate Cloth  6 each Topical Daily  . clonazepam  0.5 mg Per Tube TID  . famotidine  20 mg Per Tube BID  . folic acid  1 mg Intravenous Daily  . free water  200 mL Per Tube Q8H  . gabapentin  900 mg Per Tube TID  . guaiFENesin  15 mL Per Tube Q6H  . heparin injection (subcutaneous)  5,000  Units Subcutaneous Q8H  . insulin aspart  0-15 Units Subcutaneous Q4H  . ipratropium-albuterol  3 mL Nebulization QID  . mouth rinse  15 mL Mouth Rinse q12n4p  . methadone  10 mg Per Tube TID  . metoprolol tartrate  50 mg Per Tube BID  . nicotine  21 mg Transdermal Daily  . polyethylene glycol  17 g Per Tube Daily  . QUEtiapine  200 mg Per Tube BID  . sodium chloride flush  10-40 mL Intracatheter Q12H  . [START ON 07/06/2019] sodium chloride HYPERTONIC  4 mL Nebulization Once per day on Mon Thu  . thiamine  100 mg Per Tube Daily   Continuous Infusions: . sodium chloride Stopped (07/02/19 1610)  . feeding supplement (VITAL AF 1.2 CAL) 65 mL/hr at 07/05/19 0600      LOS: 23 days   Chauncey Mann, MD  [facility]  07/05/2019, 1:17 PM

## 2019-07-05 NOTE — Evaluation (Signed)
Clinical/Bedside Swallow Evaluation Patient Details  Name: [name]. MRN: [id number] Date of Birth: August 14, 1960  Today's Date: 07/05/2019 Time: SLP Start Time (ACUTE ONLY): [id number] SLP Stop Time (ACUTE ONLY): 0917 SLP Time Calculation (min) (ACUTE ONLY): 12 min  Past Medical History:  Past Medical History:  Diagnosis Date  . Alcohol abuse   . Alcoholic ([place])   . Back pain   . CAP (community acquired pneumonia) 11/26/2018  . COPD (chronic obstructive pulmonary disease) ([place])   . Empyema of right pleural space ([place]) 11/26/2018  . Heavy smoker   . Hep C w/ coma, chronic ([place])   . Loose, teeth   . Pancreatitis   . Poor dentition   . Tobacco abuse    Past Surgical History:  Past Surgical History:  Procedure Laterality Date  . ARTERY REPAIR  2013   neck  . BIOPSY  02/19/2019   Procedure: BIOPSY;  Surgeon: Ronnette Juniper, MD;  Location: [facility] ENDOSCOPY;  Service: Gastroenterology;;  . ESOPHAGOGASTRODUODENOSCOPY (EGD) WITH PROPOFOL N/A 02/19/2019   Procedure: ESOPHAGOGASTRODUODENOSCOPY (EGD) WITH PROPOFOL;  Surgeon: Ronnette Juniper, MD;  Location: [place];  Service: Gastroenterology;  Laterality: N/A;  . FOREIGN BODY REMOVAL  02/19/2019   Procedure: FOREIGN BODY REMOVAL;  Surgeon: Ronnette Juniper, MD;  Location: [facility] ENDOSCOPY;  Service: Gastroenterology;;  . KNEE SURGERY  2013   rt  . MICROLARYNGOSCOPY N/A 07/31/2014   Procedure: MICROLARYNGOSCOPY WITH BIOPSY ;  Surgeon: Rozetta Nunnery, MD;  Location: [place];  Service: ENT;  Laterality: N/A;  . NECK SURGERY  2013   cervical fusion-  . VASCULAR SURGERY    . VIDEO ASSISTED THORACOSCOPY (VATS)/EMPYEMA Right 11/26/2018   Procedure: VIDEO ASSISTED THORACOSCOPY (VATS)/EMPYEMA;  Surgeon: Rexene Alberts, MD;  Location: [place];  Service: Thoracic;  Laterality: Right;  [name] VIDEO BRONCHOSCOPY  11/26/2018   Procedure: Video Bronchoscopy;  Surgeon: Rexene Alberts, MD;  Location: [place];  Service: Thoracic;;   HPI:  59 year old  man with a history of alcohol use disorder, tobacco use disorder, HTN, and recent admission on 04/10/2019 for pancreatitis and hyponatremia who presents for evaluation of shortness of breath and neck swelling with loss of airway and PEA arrest requiring emergent tracheostomy.   Assessment / Plan / Recommendation Clinical Impression  Pt was seen for a bedside swallow evaluation and he presents with oral dysphagia and suspected pharyngeal dysphagia.  PMV valve was donned for the entirety of this evaluation.  Oral mechanism exam was remarkable for generalized oral weakness and a dry oral cavity.  Pt consumed trials of ice chips, thin liquid via tsp, and puree.  He exhibited good bolus acceptance with appropriate labial closure.  Bolus formation and AP transport were observed to be prolonged and pt exhibited an immediate cough following all trials.  Cough was observed to be diminished in strength.  Pt would benefit from an MBS to further evaluate swallow function.  Recommend continuation of NPO with short-term alternative means of nutrition and frequent oral care at this time.  SLP will continue to f/u per POC.   SLP Visit Diagnosis: Dysphagia, unspecified (R13.10)    Aspiration Risk  Severe aspiration risk    Diet Recommendation NPO;Alternative means - temporary   Medication Administration: Via alternative means    Other  Recommendations Oral Care Recommendations: Oral care QID;Staff/trained caregiver to provide oral care   Follow up Recommendations Skilled Nursing facility      Frequency and Duration min 2x/week  2 weeks  Prognosis Prognosis for Safe Diet Advancement: Good      Swallow Study   General Date of Onset: 07/05/19 HPI: 59 year old man with a history of alcohol use disorder, tobacco use disorder, HTN, and recent admission on 04/10/2019 for pancreatitis and hyponatremia who presents for evaluation of shortness of breath and neck swelling with loss of airway and PEA arrest  requiring emergent tracheostomy. Type of Study: Bedside Swallow Evaluation Previous Swallow Assessment: None  Diet Prior to this Study: NG Tube;NPO Temperature Spikes Noted: Yes Respiratory Status: Trach Collar History of Recent Intubation: No Behavior/Cognition: Alert;Cooperative Oral Cavity Assessment: Dry;Dried secretions Oral Care Completed by SLP: No Oral Cavity - Dentition: Missing dentition;Poor condition Patient Positioning: Upright in bed Baseline Vocal Quality: Low vocal intensity Volitional Cough: Weak Volitional Swallow: Able to elicit    Oral/Motor/Sensory Function Overall Oral Motor/Sensory Function: Generalized oral weakness Facial ROM: Reduced right;Reduced left Facial Symmetry: Within Functional Limits Facial Sensation: Within Functional Limits Lingual ROM: Reduced right;Reduced left Lingual Symmetry: Within Functional Limits   Ice Chips Ice chips: Impaired Presentation: Spoon Oral Phase Impairments: Impaired mastication Oral Phase Functional Implications: Prolonged oral transit Pharyngeal Phase Impairments: Cough - Immediate   Thin Liquid Thin Liquid: Impaired Presentation: Spoon Oral Phase Functional Implications: Prolonged oral transit Pharyngeal  Phase Impairments: Cough - Immediate    Nectar Thick Nectar Thick Liquid: Not tested   Honey Thick Honey Thick Liquid: Not tested   Puree Puree: Impaired Presentation: Spoon Oral Phase Impairments: Reduced lingual movement/coordination Oral Phase Functional Implications: Prolonged oral transit;Oral residue Pharyngeal Phase Impairments: Cough - Immediate   Solid     Solid: Not tested     Colin Mulders M.S., CCC-SLP Acute Rehabilitation Services Office: [phone]  Elvia Collum Ledon Wei[place] 07/05/2019,9:28 AM

## 2019-07-05 NOTE — Progress Notes (Signed)
  Speech Language Pathology Treatment: Nada Boozer Speaking valve  Patient Details Name: [name]. MRN: [id number] DOB: [birth year] Today's Date: 07/05/2019 Time: [id number] SLP Time Calculation (min) (ACUTE ONLY): 15 min  Assessment / Plan / Recommendation Clinical Impression  Pt with improved level of alertness in comparison to previous ST sessions per chart review.  Pt was pleasant and  cooperative throughout this session and he was agreeable to PMV trials.  SLP donned PMV for the pt and he was observed to have a clear voice with low vocal intensity.  Vocal intensity increased slightly with verbal cues; however, speech intelligibility remained reduced at approximately 75% intelligibility to an unfamiliar listener.  RR and HR remained stable and O2 improved from 94% to 97% while PMV was donned.  Pt appeared comfortable with no evident signs of distress.  He tolerated the PMV for 15 minutes without difficulty prior to SLP and RN departure. Educated pt and RN regarding how to don/doff PMV and clean appropriately.  Pt would benefit from additional education.  Recommend that pt continue to wear PMV for short periods of time (15-25 minutes) with full supervision of staff.  SLP will continue to f/u per POC.      HPI HPI: 59 year old man with a history of alcohol use disorder, tobacco use disorder, HTN, and recent admission on 04/10/2019 for pancreatitis and hyponatremia who presents for evaluation of shortness of breath and neck swelling with loss of airway and PEA arrest requiring emergent tracheostomy.      SLP Plan  Continue with current plan of care     Recommendations  Medication Administration: Via alternative means      Patient may use Passy-Muir Speech Valve: Intermittently with supervision PMSV Supervision: Full         Oral Care Recommendations: Oral care QID;Staff/trained caregiver to provide oral care Follow up Recommendations: Skilled Nursing facility SLP Visit Diagnosis:  Dysphagia, unspecified (R13.10) Plan: Continue with current plan of care            Colin Mulders M.S., [place] Office: [phone]  [place] 07/05/2019, 9:34 AM

## 2019-07-05 NOTE — Progress Notes (Signed)
PULMONARY / CRITICAL CARE MEDICINE  NAME:  William Samora., MRN:  [id number], DOB:  [birth year], LOS: 63 ADMISSION DATE:  06/12/2019, CONSULTATION DATE:  06/12/2019 REFERRING MD:  Alinda Sierras, CHIEF COMPLAINT:  Shortness of breath, sore throat, and neck swelling  BRIEF HISTORY:    59 year old man with a history of alcohol use disorder, tobacco used disorder, HTN, and recent admission on 04/10/2019 for pancreatitis and hyponatremia who presents for evaluation of shortness of breath and neck swelling. Reported  to have started 5 days ago and progressively gotten worse. Patient tachycardic to 160's and tachypnic to the 40's in the ED. Seen in ED, patient says he has neck swelling , cough, and difficulty breathing. Admitted to ICU on 1/4 with loss of airway and PEA arrest requiring emergent tracheostomy. Patient has been difficult to wean off sedation   SIGNIFICANT PAST MEDICAL HISTORY    Past Medical History:  Diagnosis Date  . Alcohol abuse   . Alcoholic ([place])   . Back pain   . CAP (community acquired pneumonia) 11/26/2018  . COPD (chronic obstructive pulmonary disease) ([place])   . Empyema of right pleural space ([place]) 11/26/2018  . Heavy smoker   . Hep C w/ coma, chronic ([place])   . Loose, teeth   . Pancreatitis   . Poor dentition   . Tobacco abuse    SIGNIFICANT EVENTS:  Admit to ICU on 1/4  1/4 : Lost airway and PEA arrest on, requiring emergent tracheostomy and ROSC obtained 1/5: 4/4 Blood cultures growing H.Influenza , narrowed to Unasyn 1/5 > 1/14: Continued on antibiotics without any significant improvement  1/14: New right foot swelling for which MRI obtained due to concerns for seeding of cellulitis from neck 1/15: MRI with synovitis and subcutaneous edema suggestive of cellulitis; orthopedics consulted for right ankle aspiration - aspirate with positive crystals suggestive of gout - patient started on NSAIDs and steroids 1/16 Started on dilaudid, D/C fentanyl  1/17: Patient agitated  early AM, q2h prn versed prescribed 1/18: Hb 5.2, transfused 2u pRBC; started methadone  1/19: Klonopin increased to tid and metoprolol added for SVTs 1/20: precedex gtt discontinued overnight for concerns of bradycardia  1/21: clonidine taper, 24hr trach collar 1/22: continuing trach collar 1/23: last day of unasyn for cellulitis, steroid taper and colchicine for gout, continuing trach collar  1/24: 72 hours of trach collar 1/25 changed to 4 cuffless 1/26 sedation cut back STUDIES:    CT Soft Tissue Neck W/ Contrast (1/4)  Left greater than right lower facial and infrahyoid neck subcutaneous edema and inflammatory changes extending into the left anterior chest wall. There is no collection. Possible pharyngeal and supraglottic laryngeal wall edema, which could be accentuated by artifact. There is mild thickening of the epiglottis. Airway remains patent. No evidence of peritonsillar abscess.   CT Chest W Contrast (1/4) Patchy airspace consolidations within the lingula and dependent left lower lobe, with some volume loss within the right lung base. Findings are favored to represent multifocal pneumonia.  Induration within the soft tissues at the base of the neck extending into the superior aspect of the left chest wall with associated skin thickening, which may reflect cellulitis.   CT Head (1/4) Subtle loss of gray-white differentiation in the anterior right lentiform insular ribbon. This could represent an acute/subacute infarct. Moderate generalized atrophy and diffuse white matter disease. This likely reflects the sequela of chronic microvascular ischemia.  MRI Brain WO Contrast (1/5) No acute intracranial abnormality.  CT Soft Tissue Neck wo Contrast (  1/11) Persistent but slightly improved left greater than right lower facial and infrahyoid neck subcutaneous edema and presumed inflammatory changes extending into the anterior chest wall. No evidence of developing  collection  CT Chest wo Contrast (1/11) No subcutaneous or soft tissue emphysema in the chest wall to suggest necrotizing fasciitis in the chest. Stable mild skin thickening with associated subcutaneous fat stranding in the high ventral left chest wall. New small dependent bilateral pleural effusions. New mild interlobular septal thickening. New patchy ground-glass opacities in both lungs predominantly in the upper lobes. New patchy centrilobular micronodularity in the right upper lobe. Acute anterior upper rib fractures bilaterally as detailed. No pneumothorax.  MR RIGHT ANKLE (1/14) No evidence of osteomyelitis, or soft tissue abscess. Mildly enhancing small ankle joint effusion which could be due to synovitis.Focal interstitial tear with posterior tibialis tenosynovitis. Mild flexor digitorum and flexor hallucis longus tenosynovitis.Focal complete disruption of the anterior talofibular ligament with fluid in the syndesmosis.Diffuse subcutaneous edema and cellulitis.  CULTURES:  Influenza A : Negative Influenza B : Negtive Sars Coronavirus: Negative MRSA Nasopharyngeal negative 1/4 Blood cultures: 4/4 Positive for H. Influenza  Urine culture: No growth 1/6 Blood cultures: NGTD on 1/11 1/13 Tracheal aspirate: few klebsiella pneumoniae  1/16 R ankle aspirate : monosodium urate crystals, no organisms on gram stain; culture w/ no growth to date 1/27: Sputum culture>>> ANTIBIOTICS:  Bactrim 1/4 Azithromycin 1/4  Vancomycin 1/4 Zosyn  1/4 one dose Clindamycin 1/4 one dose Unasyn 1/5 >> 1/23   LINES/TUBES:  Tracheostomy - placed 1/4 Peripheral IV in Right Forearm - placed 1/9 Peripheral IV in Left Forearm - placed 1/9 PICC line double lumen - placed 1/15  CONSULTANTS:  Infectious Disease  ENT  Orthopedic surgery  SUBJECTIVE:  No distress.  Has thick mucous and productive cough w/ green/yellow sputum  CONSTITUTIONAL: Blood Pressure (Abnormal) 142/64 (BP Location: Left  Arm)   Pulse (Abnormal) 103   Temperature 98.2 F (36.8 C) (Oral)   Respiration 17   Height 5\' 8"  (1.727 m)   Weight 73 kg   Oxygen Saturation 94%   Body Mass Index 24.47 kg/m   I/O last 3 completed shifts: In: 2015 [I.V.:40; NG/GT:1975] Out: 4000 [Urine:4000]   FiO2 (%):  [28 %] 28 %   PHYSICAL EXAM: General this is a 59 year old white male who is lying in bed.  He is much more interactive than when comparing exam on the 25th HEENT normocephalic atraumatic poor dentition mucous membranes moist he has a size 4 cuffless tracheostomy he is able to make hoarse phonation through thick tracheal secretions Pulmonary: Coarse scattered rhonchi no accessory use Cardiac: Regular rate and rhythm Neuro: Awake, oriented x2, intermittently agitated at times but more cooperative than when comparing previous exams moving all extremities without focal deficits Extremities: Warm dry brisk capillary refill GU clear yellow  ASSESSMENT AND PLAN   Severe Soft Tissue Cellulitis of left neck resulting in Acute Airway compromise s/p emergent percutaneous tracheostomy->completed 21 days Unasyn H. Influenza Bacteremia 4/4 bottles  Agitation/ TME superimposed on Alcohol Use Disorder Intermittent SVT Generalized edema/anasarca: RUE Superficial thrombophlebitis:  R Ankle Gout (flare resolved w/ pred taper and colchicine 1/23)  Intermittent electrolyte abnormalities    Pulmonary and associated problem List Tracheostomy dependence s/p upper airway obstruction from left neck cellulitis.  Diffuse bilateral airspace disease (treated aspiration) Rule out HCAP versus recurrent aspiration 1/27 Left basilar atelectasis Residual acute metabolic encephalopathy  -down-sized to 4 cuffless on 1/24 -Much more awake on 1/27, still having copious purulent  thick appearing sputum production, pleased to report his cough is fairly strong and he has been successful at expectorating this -Review of SLP note: Last seen on  1/26, tolerating PMV for 11 minutes and tolerated well however sounds like he was not as interactive as he is today. Portable chest x-ray personally reviewed from 1/25 continues to demonstrate diffuse patchy bilateral airspace disease.  Left basilar aeration looks somewhat improved At this point I agree with speech therapy that he needs direct observation when PMV in place given the degree of his sputum production however I am hopeful that this will continue to improve over time  Plan Continue to back off on sedating medications, will defer this to primary team at this point, he looks much better than when I saw him on the 25th We will send a sputum for culture Continue pulmonary hygiene measures including mobilization  PMV intermittently, eventually work towards more independent capping trials once sputum production improved  Continue Mucinex  Continue free water via tube   I remain hopeful he will eventually be decannulated however still need to improve his mental status, and would like to see sputum production a little less  I wonder if he would be a candidate for inpatient rehab?   Erick Colace ACNP-BC [place] Pager # [phone] OR # [phone] if no answer

## 2019-07-06 ENCOUNTER — Inpatient Hospital Stay (HOSPITAL_COMMUNITY): Payer: Medicaid Other

## 2019-07-06 LAB — BASIC METABOLIC PANEL
Anion gap: 13 (ref 5–15)
BUN: 16 mg/dL (ref 6–20)
CO2: 29 mmol/L (ref 22–32)
Calcium: 10.2 mg/dL (ref 8.9–10.3)
Chloride: 95 mmol/L — ABNORMAL LOW (ref 98–111)
Creatinine, Ser: 0.8 mg/dL (ref 0.61–1.24)
GFR calc Af Amer: >60 mL/min (ref >60)
GFR calc non Af Amer: >60 mL/min (ref >60)
Glucose, Bld: 121 mg/dL — ABNORMAL HIGH (ref 70–99)
Potassium: 4 mmol/L (ref 3.5–5.1)
Sodium: 137 mmol/L (ref 135–145)

## 2019-07-06 LAB — GLUCOSE, CAPILLARY
Glucose-Capillary: 107 mg/dL — ABNORMAL HIGH (ref 70–99)
Glucose-Capillary: 110 mg/dL — ABNORMAL HIGH (ref 70–99)
Glucose-Capillary: 115 mg/dL — ABNORMAL HIGH (ref 70–99)
Glucose-Capillary: 125 mg/dL — ABNORMAL HIGH (ref 70–99)
Glucose-Capillary: 75 mg/dL (ref 70–99)
Glucose-Capillary: 98 mg/dL (ref 70–99)

## 2019-07-06 LAB — CBC
HCT: 42 % (ref 39.0–52.0)
Hemoglobin: 12.4 g/dL — ABNORMAL LOW (ref 13.0–17.0)
MCH: 24.8 pg — ABNORMAL LOW (ref 26.0–34.0)
MCHC: 29.5 g/dL — ABNORMAL LOW (ref 30.0–36.0)
MCV: 84 fL (ref 80.0–100.0)
Platelets: 444 10*3/uL — ABNORMAL HIGH (ref 150–400)
RBC: 5 MIL/uL (ref 4.22–5.81)
RDW: 19.3 % — ABNORMAL HIGH (ref 11.5–15.5)
WBC: 8.8 10*3/uL (ref 4.0–10.5)
nRBC: 0 % (ref 0.0–0.2)

## 2019-07-06 LAB — MAGNESIUM: Magnesium: 1.8 mg/dL (ref 1.7–2.4)

## 2019-07-06 MED ORDER — TAMSULOSIN HCL 0.4 MG PO CAPS: 0.4000 mg | ORAL_CAPSULE | Freq: Every day | ORAL | Status: DC

## 2019-07-06 MED ORDER — LORAZEPAM 2 MG/ML IJ SOLN
1.0000 mg | Freq: Four times a day (QID) | INTRAMUSCULAR | Status: DC | PRN
  Administered 2019-07-06 – 2019-07-07 (×2): 1 mg via INTRAVENOUS
  Filled 2019-07-06 (×2): qty 1

## 2019-07-06 MED ORDER — MAGNESIUM SULFATE 2 GM/50ML IV SOLN
2.0000 g | Freq: Once | INTRAVENOUS | Status: AC
  Administered 2019-07-06: 2 g via INTRAVENOUS
  Filled 2019-07-06: qty 50

## 2019-07-06 MED ORDER — METOPROLOL TARTRATE 5 MG/5ML IV SOLN
5.0000 mg | Freq: Four times a day (QID) | INTRAVENOUS | Status: DC | PRN
  Administered 2019-07-06 – 2019-07-07 (×2): 5 mg via INTRAVENOUS
  Filled 2019-07-06 (×4): qty 5

## 2019-07-06 MED ORDER — CLONAZEPAM 0.25 MG PO TBDP
0.2500 mg | ORAL_TABLET | Freq: Three times a day (TID) | ORAL | Status: DC
  Administered 2019-07-06 – 2019-07-07 (×3): 0.25 mg
  Filled 2019-07-06 (×3): qty 1

## 2019-07-06 NOTE — Progress Notes (Signed)
Called into room by bedside sitter. Patient had coughed up large amount of yellow sputum which appeared to be the same color as the bile in patient's NG tube. Prior to this episode, sputum has been white/clear during this shift. No changes in patient's respiratory status at this time. This RN suctioned patient's trach and cleaned inner cannula. Very small amount of yellow sputum obtained via suction; inner cannula appears clean.   Notified respiratory therapy and on call APP, Sharlet Salina via phone. Bedside report given to [place], RN at this time.

## 2019-07-06 NOTE — Progress Notes (Signed)
Nutrition Follow-up  DOCUMENTATION CODES:   Non-severe (moderate) malnutrition in context of chronic illness  INTERVENTION:   -Recommend Cortrak tube consult for replacement. Service next available tomorrow, 1/29  Once tube is replaced and ready to use: -Resume Vital AF 1.2 increase to 65 ml/h (1560 ml per day) -Provides 1872 kcal, 117 gm protein, 1265 ml free water daily -Resume free water flushes of 200 ml every 8 hours  NUTRITION DIAGNOSIS:   Moderate Malnutrition related to chronic illness(COPD, alcoholism) as evidenced by mild fat depletion, moderate fat depletion, mild muscle depletion.  Ongoing.  GOAL:   Patient will meet greater than or equal to 90% of their needs  Not meeting, pt pulled Cortrak after tube clogged  MONITOR:   Diet advancement, Labs, Weight trends, TF tolerance, I & O's, Skin  ASSESSMENT:   59 yo male admitted with SOB, neck swelling, sore throat. Found to have H. flu in blood. S/P PEA arrest requiring emergent tracheostomy 1/4. PMH includes alcohol abuse, dysphagia, chronic hepatitis C, heavy smoker, COPD, poor dentition.   1/4: admitted, intubated d/t loss of airway and PEA arrest requiring emergent tracheostomy 1/5: Cortrak tube placed 1/27 Cortrak clogged then removed by patient  **RD working remotely**  Per chart review, pt's Cortrak became clogged 1/27. Pt was receiving Flomax via tube (which is not recommended), this has since been stopped. Pt then pulled tube out 1/27.  Recommend replacement of Cortrak 1/29.  Per MBS today, SLP recommends pt remain NPO with need of alternate means of nutrition. Pt will be able to have meds crushed in puree until tube can be replaced.  Admission weight: 170 lbs. Current weight: 153 lbs.  I/Os: -23.7L since 1/14 UOP: 850 ml x 24 hrs  Medications: IV Folic acid, Thiamine tablet  Labs reviewed: CBGs: 110  Diet Order:   Diet Order            Diet NPO time specified  Diet effective now               EDUCATION NEEDS:   Not appropriate for education at this time  Skin:  Skin Assessment: (cellulitis to neck, chest, ankle, foot, toe)  Last BM:  1/27 -type 7  Height:   Ht Readings from Last 1 Encounters:  06/12/19 5\' 8"  (1.727 m)    Weight:   Wt Readings from Last 1 Encounters:  07/06/19 69.8 kg    Ideal Body Weight:  70 kg  BMI:  Body mass index is 23.4 kg/m.  Estimated Nutritional Needs:   Kcal:  1800-2000  Protein:  100-115 gm  Fluid:  >/= 1.8 L  Clayton Bibles, MS, RD, LDN Inpatient Clinical Dietitian Pager: [phone] After Hours Pager: [phone]

## 2019-07-06 NOTE — Progress Notes (Addendum)
Modified Barium Swallow Progress Note  Patient Details  Name: [name]. MRN: [id number] Date of Birth: 1961/01/08  Today's Date: 07/06/2019  Modified Barium Swallow completed.  Full report located under Chart Review in the Imaging Section.  Brief recommendations include the following:  Clinical Impression  Pt presents with severe oropharyngeal dysphagia c/b lingual discoordination, premature spillage, reduced base of tonge retraction, decreased pharyngeal peristalsis, incomplete laryngeal closure, decreased hyolaryngeal excusion, and diminished sensation.  These deficits results in silent aspiration of thin and nectar thick liquids during and after the swallow, penetration of honey thick liquid, and significant vallecular stasis of puree consistency.  Aspiration of thin and nectar thick liquids was not always sensed, and pt was unable to follow direction for cued cough.  Reflexive cough was not beneficial to clear aspiration.  There was a moderate amount of aspiration at both anterior and posterior trachea.  Honey thick liquid was penetrated without full clearance, and appeared to progress to the level of the vocal folds; however, there was some residue in laryngeal vestibule from prior trials as well, which may have mixed with honey thick liquid making it thinner.  With puree, there was no prandial penetration or aspiration, but there was severe vallecular residue which was partially cleared with honey thick liquid wash.  Pt is at risk for aspiration of residueal puree, especially as it mixes with thin secretions. All bolus trials were given with PMV in place.  Pt is a poor candidate for swallowing therapy at this time 2/2 inability to follow directions.  Consider repeat evaluation early next week or as overall status improves.  Recommend pt remain NPO with alternate means of nutrition, hydration, and medication.    Pt currently does not have AMN as he has pulled cortrak. If pt is kept PO  overnight for cortrak team, and there are priority oral meds that must be given, please crush with puree.   Swallow Evaluation Recommendations       SLP Diet Recommendations: NPO;Alternative means - temporary       Medication Administration: Via alternative means               Oral Care Recommendations: Oral care QID        Celedonio Savage, MA, [facility] Office: [phone] 07/06/2019,10:50 AM

## 2019-07-06 NOTE — Progress Notes (Signed)
CPT not done at this time due to extreme agitation. Family member at bedside

## 2019-07-06 NOTE — Progress Notes (Signed)
PROGRESS NOTE    [name].  [id number] DOB: [birth year] DOA: 06/12/2019 PCP: Alvester Chou, NP   Brief Narrative: 59 year old with past medical history significant for alcohol use disorder, tobacco, hypertension recent admission in 06/09/2018 for pancreatitis and hyponatremia who presents for evaluation of shortness of breath and neck swelling.  Reported to have it started 5 days ago and progressively gotten worse.  Patient tachycardic to 160 and tachypneic to the 40s in the ED.  Seen in the ED, patient says he has neck swelling, cough and difficulty breathing.  Admitted to ICU on 1/4 with loss of airway and PEA arrest requiring emergent tracheostomy.  Patient was  difficult to wean off of sedation.  He has been off of Precedex.  He was a started on Klonopin on 1/19.  Patient was transitioned to trach collar on 1/20.   CT of the neck soft tissue with contrast on 06/12/2019 showed left greater than right facial infrahyoid neck subcutaneous edema and inflammatory changes extending into the left anterior chest wall there is no fluid collection, but there is possible pharyngeal and supraglottic laryngeal wall edema.   Assessment & Plan:   Principal Problem:   Neck infection Active Problems:   Alcohol abuse   Poor dentition   Septic shock (HCC)   Laryngeal edema   Multifocal pneumonia   Airway compromise   Chronic hepatitis C without hepatic coma (HCC)   Acute respiratory failure with hypoxemia (HCC)   Cardiac arrest, cause unspecified (HCC)   Haemophilis influenzae bacteremia    Malnutrition of moderate degree   Atelectasis   Tracheostomy care (HCC)   Acute metabolic encephalopathy  1-Severe soft tissue cellulitis/acute respiratory failure with hypoxemia, acute airway compromise status post emergent percutaneous tracheostomy/Haemophilus influenza bacteremia/multifocal pneumonia: -4 out of 4 blood cultures grew Haemophilus influenza beta-lactamase negative, completed course of IV  Unasyn of 21 days on 07/01/2019 -He has been able to tolerate trach collar, he was changed to a smaller size.  CCM following. -Follow sputum culture: Growing gram-negative rods.  2-Acute confusional state/alcohol use disorder: Resolving Alert and oriented and following some commands. On Seroquel twice daily, methadone 3 times daily Klonopin 3 times daily Continue to wean benzo as able. Still agitated at times. MRI negative for acute stroke.  3-Intermittent SVT: Started on metoprolol 50 twice daily As needed doses now that he does not have any core track Replete magnesium  4-generalized edema/anasarca His net  negative.  Holding Lasix  5-right upper extremity edema: Resolving Right upper extremity ultrasound showed superficial vein thrombosis. Local care.  6-Urinary retention: We will start Flomax.  He will need voiding trial.  7-Right ankle gout: Received steroid taper  8-Dysphagia, moderate caloric malnutrition: Patient remove core track.  This will need to be replaced. He failed a swallow evaluation.  9-Cardiac arrest, cause unspecified 10-Chronic hepatitis C without hepatic coma   Nutrition Problem: Moderate Malnutrition Etiology: chronic illness(COPD, alcoholism)    Signs/Symptoms: mild fat depletion, moderate fat depletion, mild muscle depletion    Interventions: Tube feeding  Estimated body mass index is 23.4 kg/m as calculated from the following:   Height as of this encounter: 5\' 8"  (1.727 m).   Weight as of this encounter: 69.8 kg.   DVT prophylaxis: heparin  Code Status: Full code Family Communication: No family at bedside Disposition Plan: Patient from home.  He will require skilled nursing facility.  He is still requiring core track for nutrition, still with delirium and agitation at times. Consultants:   CCM  Procedures/ Events:  Admit to ICU on 1/4  1/4 : Lost airway and PEA arrest on, requiring emergent tracheostomy and ROSC  obtained 1/5: 4/4 Blood cultures growing H.Influenza , narrowed to Unasyn 1/5 > 1/14: Continued on antibiotics without any significant improvement  1/14: New right foot swelling for which MRI obtained due to concerns for seeding of cellulitis from neck 1/15: MRI with synovitis and subcutaneous edema suggestive of cellulitis; orthopedics consulted for right ankle aspiration - aspirate with positive crystals suggestive of gout - patient started on NSAIDs and steroids 1/16 Started on dilaudid, D/C fentanyl  1/17: Patient agitated early AM, q2h prn versed prescribed 1/18: Hb 5.2, transfused 2u pRBC; started methadone  1/19: Klonopin increased to tid and metoprolol added for SVTs 1/20: precedex gtt discontinued overnight for concerns of bradycardia  1/21: clonidine taper, 24hr trach collar 1/22: continuing trach collar 1/23: last day of unasyn for cellulitis, steroid taper and colchicine for gout, continuing trach collar  1/24: 72 hours of trach collar 1/25 changed to 4 cuffless 1/26 sedation cut back  Cultures;  Influenza A : Negative Influenza B : Negtive Sars Coronavirus: Negative MRSA Nasopharyngeal negative 1/4 Blood cultures: 4/4 Positive for H. Influenza  Urine culture: No growth 1/6 Blood cultures: NGTD on 1/11 1/13 Tracheal aspirate: few klebsiella pneumoniae  1/16 R ankle aspirate : monosodium urate crystals, no organisms on gram stain; culture w/ no growth to date 1/27: Sputum culture>>>  Antibiotics;  Bactrim 1/4 Azithromycin 1/4  Vancomycin 1/4 Zosyn  1/4 one dose Clindamycin 1/4 one dose Unasyn 1/5 >> 1/23   Subjective: Patient is alert unable to understand what he said because of the trach. Per nurse he has been agitated.  He remove core track.   Objective: Vitals:   07/06/19 0212 07/06/19 0350 07/06/19 0736 07/06/19 0742  BP: (!) 160/87 (!) 159/88 (!) 160/92 (!) 153/88  Pulse: (!) 124 (!) 131 (!) 130 (!) 127  Resp: 15 16 (!) 21 16  Temp:  98.7 F (37.1 C)     TempSrc:  Oral    SpO2: 98% 98% 98% 99%  Weight:  69.8 kg    Height:        Intake/Output Summary (Last 24 hours) at 07/06/2019 0843 Last data filed at 07/06/2019 0238 Gross per 24 hour  Intake 40 ml  Output 2150 ml  Net -2110 ml   Filed Weights   07/03/19 0402 07/05/19 0439 07/06/19 0350  Weight: 77.2 kg 73 kg 69.8 kg    Examination:  General exam: Agitated at times Respiratory system: Clear to auscultation. Respiratory effort normal. Cardiovascular system: S1 & S2 heard, RRR. No JVD, murmurs, rubs, gallops or clicks. No pedal edema. Gastrointestinal system: Abdomen is nondistended, soft and nontender. No organomegaly or masses felt. Normal bowel sounds heard. Central nervous system: Alert.  Follows some commands Extremities: No edema Skin: No rashes, lesions or ulcers Psychiatry: Agitated at times   Data Reviewed: I have personally reviewed following labs and imaging studies  CBC: Recent Labs  Lab 06/30/19 0318 07/01/19 0514 07/02/19 0403 07/03/19 0433  WBC 6.5 7.7 7.5 8.4  HGB 8.0* 10.9* 11.3* 11.8*  HCT 27.7* 37.7* 38.9* 40.4  MCV 86.0 85.9 85.5 85.6  PLT 455* 573* 521* [id number]*   Basic Metabolic Panel: Recent Labs  Lab 07/01/19 0514 07/02/19 0403 07/03/19 0433 07/04/19 0545 07/05/19 0424  NA 141 139 136 138 136  K 4.0 4.1 4.3 4.4 4.5  CL 100 97* 94* 96* 94*  CO2 31 31 31  32 32  GLUCOSE 111* 103* 106* 116* 115*  BUN 16 16 19 17 18   CREATININE 0.68 0.59* 0.94 0.62 0.67  CALCIUM 8.9 9.2 9.3 9.5 9.5  MG 2.0  --   --   --   --    GFR: Estimated Creatinine Clearance: 98.6 mL/min (by C-G formula based on SCr of 0.67 mg/dL). Liver Function Tests: No results for input(s): AST, ALT, ALKPHOS, BILITOT, PROT, ALBUMIN in the last 168 hours. No results for input(s): LIPASE, AMYLASE in the last 168 hours. No results for input(s): AMMONIA in the last 168 hours. Coagulation Profile: No results for input(s): INR, PROTIME in the last 168 hours. Cardiac Enzymes: No  results for input(s): CKTOTAL, CKMB, CKMBINDEX, TROPONINI in the last 168 hours. BNP (last 3 results) No results for input(s): PROBNP in the last 8760 hours. HbA1C: No results for input(s): HGBA1C in the last 72 hours. CBG: Recent Labs  Lab 07/05/19 1630 07/05/19 2024 07/06/19 0006 07/06/19 0356 07/06/19 0747  GLUCAP 88 82 75 98 110*   Lipid Profile: No results for input(s): CHOL, HDL, LDLCALC, TRIG, CHOLHDL, LDLDIRECT in the last 72 hours. Thyroid Function Tests: No results for input(s): TSH, T4TOTAL, FREET4, T3FREE, THYROIDAB in the last 72 hours. Anemia Panel: No results for input(s): VITAMINB12, FOLATE, FERRITIN, TIBC, IRON, RETICCTPCT in the last 72 hours. Sepsis Labs: No results for input(s): PROCALCITON, LATICACIDVEN in the last 168 hours.  Recent Results (from the past 240 hour(s))  Culture, respiratory (non-expectorated)     Status: None (Preliminary result)   Collection Time: 07/05/19 11:53 AM   Specimen: Tracheal Aspirate; Respiratory  Result Value Ref Range Status   Specimen Description TRACHEAL ASPIRATE  Final   Special Requests NONE  Final   Gram Stain   Final    MODERATE WBC PRESENT,BOTH PMN AND MONONUCLEAR RARE GRAM POSITIVE COCCI IN PAIRS    Culture   Final    RARE GRAM NEGATIVE RODS IDENTIFICATION AND SUSCEPTIBILITIES TO FOLLOW CULTURE REINCUBATED FOR BETTER GROWTH Performed at [facility], [place] [address] [place], [place] [address]    Report Status PENDING  Incomplete         Radiology Studies: No results found.      Scheduled Meds: . chlorhexidine  15 mL Mouth Rinse BID  . Chlorhexidine Gluconate Cloth  6 each Topical Daily  . clonazepam  0.5 mg Per Tube TID  . famotidine  20 mg Per Tube BID  . folic acid  1 mg Intravenous Daily  . free water  200 mL Per Tube Q8H  . gabapentin  900 mg Per Tube TID  . guaiFENesin  15 mL Per Tube Q6H  . heparin injection (subcutaneous)  5,000 Units Subcutaneous Q8H  . insulin aspart  0-15 Units  Subcutaneous Q4H  . ipratropium-albuterol  3 mL Nebulization QID  . mouth rinse  15 mL Mouth Rinse q12n4p  . methadone  10 mg Per Tube TID  . metoprolol tartrate  50 mg Per Tube BID  . nicotine  21 mg Transdermal Daily  . polyethylene glycol  17 g Per Tube Daily  . QUEtiapine  200 mg Per Tube BID  . sodium chloride flush  10-40 mL Intracatheter Q12H  . thiamine  100 mg Per Tube Daily   Continuous Infusions: . sodium chloride Stopped (07/02/19 1610)  . feeding supplement (VITAL AF 1.2 CAL) 65 mL/hr at 07/05/19 0600     LOS: 24 days    Time spent: 35 minutes.  Elmarie Shiley, MD [facility]  If 7PM-7AM, please contact night-coverage  07/06/2019, 8:43 AM

## 2019-07-06 NOTE — Progress Notes (Signed)
PULMONARY / CRITICAL CARE MEDICINE  NAME:  [name]., MRN:  [id number], DOB:  12-10-1960, LOS: 24 ADMISSION DATE:  06/12/2019, CONSULTATION DATE:  06/12/2019 REFERRING MD:  Alinda Sierras, CHIEF COMPLAINT:  Shortness of breath, sore throat, and neck swelling  BRIEF HISTORY:    59 year old man with a history of alcohol use disorder, tobacco used disorder, HTN, and recent admission on 04/10/2019 for pancreatitis and hyponatremia who presents for evaluation of shortness of breath and neck swelling. Reported  to have started 5 days ago and progressively gotten worse. Patient tachycardic to 160's and tachypnic to the 40's in the ED. Seen in ED, patient says he has neck swelling , cough, and difficulty breathing. Admitted to ICU on 1/4 with loss of airway and PEA arrest requiring emergent tracheostomy. Patient has been difficult to wean off sedation   SIGNIFICANT PAST MEDICAL HISTORY    Past Medical History:  Diagnosis Date  . Alcohol abuse   . Alcoholic ([place])   . Back pain   . CAP (community acquired pneumonia) 11/26/2018  . COPD (chronic obstructive pulmonary disease) ([place])   . Empyema of right pleural space ([place]) 11/26/2018  . Heavy smoker   . Hep C w/ coma, chronic ([place])   . Loose, teeth   . Pancreatitis   . Poor dentition   . Tobacco abuse    SIGNIFICANT EVENTS:  Admit to ICU on 1/4  1/4 : Lost airway and PEA arrest on, requiring emergent tracheostomy and ROSC obtained 1/5: 4/4 Blood cultures growing H.Influenza , narrowed to Unasyn 1/5 > 1/14: Continued on antibiotics without any significant improvement  1/14: New right foot swelling for which MRI obtained due to concerns for seeding of cellulitis from neck 1/15: MRI with synovitis and subcutaneous edema suggestive of cellulitis; orthopedics consulted for right ankle aspiration - aspirate with positive crystals suggestive of gout - patient started on NSAIDs and steroids 1/16 Started on dilaudid, D/C fentanyl  1/17: Patient agitated  early AM, q2h prn versed prescribed 1/18: Hb 5.2, transfused 2u pRBC; started methadone  1/19: Klonopin increased to tid and metoprolol added for SVTs 1/20: precedex gtt discontinued overnight for concerns of bradycardia  1/21: clonidine taper, 24hr trach collar 1/22: continuing trach collar 1/23: last day of unasyn for cellulitis, steroid taper and colchicine for gout, continuing trach collar  1/24: 72 hours of trach collar 1/25 changed to 4 cuffless 1/26 sedation cut back STUDIES:    CT Soft Tissue Neck W/ Contrast (1/4)  Left greater than right lower facial and infrahyoid neck subcutaneous edema and inflammatory changes extending into the left anterior chest wall. There is no collection. Possible pharyngeal and supraglottic laryngeal wall edema, which could be accentuated by artifact. There is mild thickening of the epiglottis. Airway remains patent. No evidence of peritonsillar abscess.   CT Chest W Contrast (1/4) Patchy airspace consolidations within the lingula and dependent left lower lobe, with some volume loss within the right lung base. Findings are favored to represent multifocal pneumonia.  Induration within the soft tissues at the base of the neck extending into the superior aspect of the left chest wall with associated skin thickening, which may reflect cellulitis.   CT Head (1/4) Subtle loss of gray-white differentiation in the anterior right lentiform insular ribbon. This could represent an acute/subacute infarct. Moderate generalized atrophy and diffuse white matter disease. This likely reflects the sequela of chronic microvascular ischemia.  MRI Brain WO Contrast (1/5) No acute intracranial abnormality.  CT Soft Tissue Neck wo Contrast (  1/11) Persistent but slightly improved left greater than right lower facial and infrahyoid neck subcutaneous edema and presumed inflammatory changes extending into the anterior chest wall. No evidence of developing  collection  CT Chest wo Contrast (1/11) No subcutaneous or soft tissue emphysema in the chest wall to suggest necrotizing fasciitis in the chest. Stable mild skin thickening with associated subcutaneous fat stranding in the high ventral left chest wall. New small dependent bilateral pleural effusions. New mild interlobular septal thickening. New patchy ground-glass opacities in both lungs predominantly in the upper lobes. New patchy centrilobular micronodularity in the right upper lobe. Acute anterior upper rib fractures bilaterally as detailed. No pneumothorax.  MR RIGHT ANKLE (1/14) No evidence of osteomyelitis, or soft tissue abscess. Mildly enhancing small ankle joint effusion which could be due to synovitis.Focal interstitial tear with posterior tibialis tenosynovitis. Mild flexor digitorum and flexor hallucis longus tenosynovitis.Focal complete disruption of the anterior talofibular ligament with fluid in the syndesmosis.Diffuse subcutaneous edema and cellulitis.  CULTURES:  Influenza A : Negative Influenza B : Negtive Sars Coronavirus: Negative MRSA Nasopharyngeal negative 1/4 Blood cultures: 4/4 Positive for H. Influenza  Urine culture: No growth 1/6 Blood cultures: NGTD on 1/11 1/13 Tracheal aspirate: few klebsiella pneumoniae  1/16 R ankle aspirate : monosodium urate crystals, no organisms on gram stain; culture w/ no growth to date 1/27: Sputum culture>>> ANTIBIOTICS:  Bactrim 1/4 Azithromycin 1/4  Vancomycin 1/4 Zosyn  1/4 one dose Clindamycin 1/4 one dose Unasyn 1/5 >> 1/23   LINES/TUBES:  Tracheostomy - placed 1/4 Peripheral IV in Right Forearm - placed 1/9 Peripheral IV in Left Forearm - placed 1/9 PICC line double lumen - placed 1/15  CONSULTANTS:  Infectious Disease  ENT  Orthopedic surgery  SUBJECTIVE:  Does okay but some periods of agitation persist requiring mits  CONSTITUTIONAL: BP (!) 159/88   Pulse (!) 131   Temp 98.7 F (37.1 C) (Oral)    Resp 16   Ht 5\' 8"  (1.727 m)   Wt 69.8 kg   SpO2 98%   BMI 23.40 kg/m   I/O last 3 completed shifts: In: 1125 [I.V.:40; NG/GT:1085] Out: 3550 [Urine:3550]   FiO2 (%):  [28 %] 28 %   PHYSICAL EXAM: GEN: no acute distress HEENT: 4 cuffless in place, no secretions at present, on TC, talks with PMV without issue CV:  RRR, ext warm PULM: Scattered, rhonci, no accessory muscle use GI: Soft, +BS EXT: No edema NEURO: Moves all 4 ext to command PSYCH: RASS 0 SKIN: No rashes   ASSESSMENT AND PLAN   Severe Soft Tissue Cellulitis of left neck resulting in Acute Airway compromise s/p emergent percutaneous tracheostomy->completed 21 days Unasyn H. 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  Diffuse bilateral airspace disease (treated aspiration) Rule out HCAP versus recurrent aspiration 1/27 Left basilar atelectasis Residual acute metabolic encephalopathy  Continue TC, CPT and hypertonic nebs Continue to wean benzodiazepines: 0.5 klonipin TID to 0.25 klonipin TID today PMV with supervision Cortrak replacement when able Continue PT/OT/SLP working with him Once secretions and mental status under better control, can do cap trial; not ready yet Will follow with you  Erskine Emery MD PCCM

## 2019-07-06 NOTE — Progress Notes (Signed)
Spoke to NP [facility] & made aware of the report given by previous RN Colletta [place] with the bile like color secretions in large amount that comes out from pt's trach. NP Sharlet Salina spoke to RT Tim to reassessed pt. Pt is not on distress with RR=18; Sats=99 on 5L (28%) TC. NP Sharlet Salina made aware of using NGT only with meds. Will continue to monitor pt.

## 2019-07-07 ENCOUNTER — Inpatient Hospital Stay (HOSPITAL_COMMUNITY): Payer: Medicaid Other

## 2019-07-07 LAB — BASIC METABOLIC PANEL
Anion gap: 12 (ref 5–15)
BUN: 17 mg/dL (ref 6–20)
CO2: 29 mmol/L (ref 22–32)
Calcium: 10.5 mg/dL — ABNORMAL HIGH (ref 8.9–10.3)
Chloride: 100 mmol/L (ref 98–111)
Creatinine, Ser: 0.95 mg/dL (ref 0.61–1.24)
GFR calc Af Amer: >60 mL/min (ref >60)
GFR calc non Af Amer: >60 mL/min (ref >60)
Glucose, Bld: 119 mg/dL — ABNORMAL HIGH (ref 70–99)
Potassium: 4.4 mmol/L (ref 3.5–5.1)
Sodium: 141 mmol/L (ref 135–145)

## 2019-07-07 LAB — CBC
HCT: 41.2 % (ref 39.0–52.0)
Hemoglobin: 12.4 g/dL — ABNORMAL LOW (ref 13.0–17.0)
MCH: 25.2 pg — ABNORMAL LOW (ref 26.0–34.0)
MCHC: 30.1 g/dL (ref 30.0–36.0)
MCV: 83.7 fL (ref 80.0–100.0)
Platelets: 386 10*3/uL (ref 150–400)
RBC: 4.92 MIL/uL (ref 4.22–5.81)
RDW: 18.9 % — ABNORMAL HIGH (ref 11.5–15.5)
WBC: 13.7 10*3/uL — ABNORMAL HIGH (ref 4.0–10.5)
nRBC: 0 % (ref 0.0–0.2)

## 2019-07-07 LAB — GLUCOSE, CAPILLARY
Glucose-Capillary: 100 mg/dL — ABNORMAL HIGH (ref 70–99)
Glucose-Capillary: 106 mg/dL — ABNORMAL HIGH (ref 70–99)
Glucose-Capillary: 108 mg/dL — ABNORMAL HIGH (ref 70–99)
Glucose-Capillary: 114 mg/dL — ABNORMAL HIGH (ref 70–99)
Glucose-Capillary: 117 mg/dL — ABNORMAL HIGH (ref 70–99)
Glucose-Capillary: 137 mg/dL — ABNORMAL HIGH (ref 70–99)
Glucose-Capillary: 95 mg/dL (ref 70–99)

## 2019-07-07 MED ORDER — IPRATROPIUM-ALBUTEROL 0.5-2.5 (3) MG/3ML IN SOLN: 3.0000 mL | Freq: Four times a day (QID) | RESPIRATORY_TRACT | Status: DC | PRN

## 2019-07-07 MED ORDER — SODIUM CHLORIDE 0.9 % IV SOLN
1.0000 g | INTRAVENOUS | Status: DC
  Administered 2019-07-07 – 2019-07-09 (×3): 1 g via INTRAVENOUS
  Filled 2019-07-07 (×3): qty 10
  Filled 2019-07-07: qty 1
  Filled 2019-07-07: qty 10

## 2019-07-07 MED ORDER — OSMOLITE 1.2 CAL PO LIQD
1000.0000 mL | ORAL | Status: DC
  Filled 2019-07-07 (×3): qty 1000

## 2019-07-07 MED ORDER — BETHANECHOL CHLORIDE 5 MG PO TABS
5.0000 mg | ORAL_TABLET | Freq: Three times a day (TID) | ORAL | Status: DC
  Administered 2019-07-07 – 2019-07-11 (×12): 5 mg via ORAL
  Filled 2019-07-07 (×14): qty 1

## 2019-07-07 MED ORDER — CLONAZEPAM 0.25 MG PO TBDP
0.2500 mg | ORAL_TABLET | Freq: Two times a day (BID) | ORAL | Status: DC | PRN
  Administered 2019-07-07: 0.25 mg
  Filled 2019-07-07: qty 1

## 2019-07-07 NOTE — Progress Notes (Signed)
Trach capped. No noted respiratory issues at this time.

## 2019-07-07 NOTE — Procedures (Signed)
Cortrak  Person Inserting Tube:  Jacklynn Barnacle E, RD Tube Type:  Cortrak - 43 inches Tube Location:  Right nare Initial Placement:  Stomach Secured by: Bridle Technique Used to Measure Tube Placement:  Documented cm marking at nare/ corner of mouth Cortrak Secured At:  65 cm    Cortrak Tube Team Note:  Consult received to place a Cortrak feeding tube.  No x-ray is required. RN may begin using tube.  If the tube becomes dislodged please keep the tube and contact the Cortrak team at [email or web address] ([email or web address]) for replacement.  If after hours and replacement cannot be delayed, place a NG tube and confirm placement with an abdominal x-ray.   Jacklynn Barnacle, MS, RD, LDN Office: [phone] Pager: [phone] After Hours/Weekend Pager: [phone]

## 2019-07-07 NOTE — Progress Notes (Signed)
Physical Therapy Treatment Patient Details Name: [name]. MRN: [id number] DOB: 1961-03-31 Today's Date: 07/07/2019    History of Present Illness 59 year old man with a history of alcohol use disorder, tobacco used disorder, HTN, and recent admission on 04/10/2019 for pancreatitis and hyponatremia who presents for evaluation of shortness of breath and neck swelling.     PT Comments    Patient received in bed, lethargic potentially due to medications, not really awakening even to cold washcloth or sternal rub. MD asking for him to be OOB today. Required totalA for rolling, had to use MaxiMove for transfer to chair due to lethargy this morning. Remained lethargic throughout session but did once time open eyes and seem to have protective response when maximove was being taken away after he was in the chair. Left upright in chair with all needs met, NT sitter present, all needs otherwise met and RN aware of need for lift for back to bed.     Follow Up Recommendations  SNF;Supervision for mobility/OOB     Equipment Recommendations  None recommended by PT    Recommendations for Other Services       Precautions / Restrictions Precautions Precautions: Fall Precaution Comments: trach collar, bilat mittens; posey wrist restraints sometimes on, sometimes off depending on restlessness/pulling at lines Restrictions Weight Bearing Restrictions: No    Mobility  Bed Mobility Overal bed mobility: Needs Assistance Bed Mobility: Rolling Rolling: Total assist;+2 for physical assistance         General bed mobility comments: totalA for rolling due to lethargy  Transfers Overall transfer level: Needs assistance Equipment used: Ambulation equipment used   Sit to Stand: Total assist;+2 physical assistance         General transfer comment: use of maximove for transfer to chair, HR to 120 once up in chair and remained lethargic  Ambulation/Gait             General Gait  Details: deferred- lethargy and elevated HR   Stairs             Wheelchair Mobility    Modified Rankin (Stroke Patients Only)       Balance Overall balance assessment: Needs assistance Sitting-balance support: No upper extremity supported;Feet unsupported Sitting balance-Leahy Scale: Zero                                      Cognition Arousal/Alertness: Lethargic;Suspect due to medications Behavior During Therapy: Flat affect                                   General Comments: briefly opens eyes to max stimuli. Observed to hold eyes open for a few seconds after lift transfer to recliner and raise arm in protective gesture. Otherwise, lethargic and unresponsive.      Exercises      General Comments General comments (skin integrity, edema, etc.): HR 116-117BPM at rest, up to 120BPM once in chair; VSS otherwise      Pertinent Vitals/Pain Pain Assessment: Faces Pain Score: 0-No pain Faces Pain Scale: No hurt Pain Intervention(s): Limited activity within patient's tolerance;Monitored during session    Home Living                      Prior Function            PT  Goals (current goals can now be found in the care plan section) Acute Rehab PT Goals Patient Stated Goal: unable to state PT Goal Formulation: Patient unable to participate in goal setting Potential to Achieve Goals: Fair Progress towards PT goals: Not progressing toward goals - comment(limited by lethargy)    Frequency    Min 2X/week      PT Plan Current plan remains appropriate    Co-evaluation PT/OT/SLP Co-Evaluation/Treatment: Yes Reason for Co-Treatment: Complexity of the patient's impairments (multi-system involvement);For patient/therapist safety;To address functional/ADL transfers          AM-PAC PT "6 Clicks" Mobility   Outcome Measure  Help needed turning from your back to your side while in a flat bed without using bedrails?:  Total Help needed moving from lying on your back to sitting on the side of a flat bed without using bedrails?: Total Help needed moving to and from a bed to a chair (including a wheelchair)?: Total Help needed standing up from a chair using your arms (e.g., wheelchair or bedside chair)?: Total Help needed to walk in hospital room?: Total Help needed climbing 3-5 steps with a railing? : Total 6 Click Score: 6    End of Session Equipment Utilized During Treatment: Oxygen Activity Tolerance: Patient limited by lethargy Patient left: in chair;with call bell/phone within reach;with restraints reapplied;with nursing/sitter in room Nurse Communication: Mobility status;Need for lift equipment PT Visit Diagnosis: Muscle weakness (generalized) (M62.81);Difficulty in walking, not elsewhere classified (R26.2) Pain - part of body: (abdomen)     Time: 4599-7741 PT Time Calculation (min) (ACUTE ONLY): 45 min  Charges:  $Therapeutic Activity: 23-37 mins                     Windell Norfolk, DPT, PN1   Supplemental Physical Therapist [place]    Pager [phone] Acute Rehab Office [phone]

## 2019-07-07 NOTE — Progress Notes (Signed)
Nutrition Note  Noted that NGT was placed yesterday to replace Cortrak. Per chart review, pt having N/V. Chest x-ray suggests tube has retracted back into esophagus. Will need advancement.   Once tube has proper placement:  -Recommend resume Vital AF 1.2 @ 65 ml/h (1560 ml per day) via NGT -Provides 1872 kcal, 117 gm protein, 1265 ml free water daily -Resume free water flushes of 200 ml every 8 hours (600 ml)  Labs and medications reviewed.   Will continue to monitor. If nutrition issues arise, please consult RD.   Clayton Bibles, MS, RD, LDN Inpatient Clinical Dietitian Pager: [phone] After Hours Pager: [phone]

## 2019-07-07 NOTE — Progress Notes (Signed)
PULMONARY / CRITICAL CARE MEDICINE  NAME:  [name]., MRN:  [id number], DOB:  June 22, 1960, LOS: 8 ADMISSION DATE:  06/12/2019, CONSULTATION DATE:  06/12/2019 REFERRING MD:  Alinda Sierras, CHIEF COMPLAINT:  Shortness of breath, sore throat, and neck swelling  BRIEF HISTORY:    59 year old man with a history of alcohol use disorder, tobacco used disorder, HTN, and recent admission on 04/10/2019 for pancreatitis and hyponatremia who presents for evaluation of shortness of breath and neck swelling. Reported  to have started 5 days ago and progressively gotten worse. Patient tachycardic to 160's and tachypnic to the 40's in the ED. Seen in ED, patient says he has neck swelling , cough, and difficulty breathing. Admitted to ICU on 1/4 with loss of airway and PEA arrest requiring emergent tracheostomy. Patient has been difficult to wean off sedation   SIGNIFICANT PAST MEDICAL HISTORY    Past Medical History:  Diagnosis Date  . Alcohol abuse   . Alcoholic ([place])   . Back pain   . CAP (community acquired pneumonia) 11/26/2018  . COPD (chronic obstructive pulmonary disease) ([place])   . Empyema of right pleural space ([place]) 11/26/2018  . Heavy smoker   . Hep C w/ coma, chronic ([place])   . Loose, teeth   . Pancreatitis   . Poor dentition   . Tobacco abuse    SIGNIFICANT EVENTS:  Admit to ICU on 1/4  1/4 : Lost airway and PEA arrest on, requiring emergent tracheostomy and ROSC obtained 1/5: 4/4 Blood cultures growing H.Influenza , narrowed to Unasyn 1/5 > 1/14: Continued on antibiotics without any significant improvement  1/14: New right foot swelling for which MRI obtained due to concerns for seeding of cellulitis from neck 1/15: MRI with synovitis and subcutaneous edema suggestive of cellulitis; orthopedics consulted for right ankle aspiration - aspirate with positive crystals suggestive of gout - patient started on NSAIDs and steroids 1/16 Started on dilaudid, D/C fentanyl  1/17: Patient agitated  early AM, q2h prn versed prescribed 1/18: Hb 5.2, transfused 2u pRBC; started methadone  1/19: Klonopin increased to tid and metoprolol added for SVTs 1/20: precedex gtt discontinued overnight for concerns of bradycardia  1/21: clonidine taper, 24hr trach collar 1/22: continuing trach collar 1/23: last day of unasyn for cellulitis, steroid taper and colchicine for gout, continuing trach collar  1/24: 72 hours of trach collar 1/25 changed to 4 cuffless 1/26 sedation cut back STUDIES:    CT Soft Tissue Neck W/ Contrast (1/4)  Left greater than right lower facial and infrahyoid neck subcutaneous edema and inflammatory changes extending into the left anterior chest wall. There is no collection. Possible pharyngeal and supraglottic laryngeal wall edema, which could be accentuated by artifact. There is mild thickening of the epiglottis. Airway remains patent. No evidence of peritonsillar abscess.   CT Chest W Contrast (1/4) Patchy airspace consolidations within the lingula and dependent left lower lobe, with some volume loss within the right lung base. Findings are favored to represent multifocal pneumonia.  Induration within the soft tissues at the base of the neck extending into the superior aspect of the left chest wall with associated skin thickening, which may reflect cellulitis.   CT Head (1/4) Subtle loss of gray-white differentiation in the anterior right lentiform insular ribbon. This could represent an acute/subacute infarct. Moderate generalized atrophy and diffuse white matter disease. This likely reflects the sequela of chronic microvascular ischemia.  MRI Brain WO Contrast (1/5) No acute intracranial abnormality.  CT Soft Tissue Neck wo Contrast (  1/11) Persistent but slightly improved left greater than right lower facial and infrahyoid neck subcutaneous edema and presumed inflammatory changes extending into the anterior chest wall. No evidence of developing  collection  CT Chest wo Contrast (1/11) No subcutaneous or soft tissue emphysema in the chest wall to suggest necrotizing fasciitis in the chest. Stable mild skin thickening with associated subcutaneous fat stranding in the high ventral left chest wall. New small dependent bilateral pleural effusions. New mild interlobular septal thickening. New patchy ground-glass opacities in both lungs predominantly in the upper lobes. New patchy centrilobular micronodularity in the right upper lobe. Acute anterior upper rib fractures bilaterally as detailed. No pneumothorax.  MR RIGHT ANKLE (1/14) No evidence of osteomyelitis, or soft tissue abscess. Mildly enhancing small ankle joint effusion which could be due to synovitis.Focal interstitial tear with posterior tibialis tenosynovitis. Mild flexor digitorum and flexor hallucis longus tenosynovitis.Focal complete disruption of the anterior talofibular ligament with fluid in the syndesmosis.Diffuse subcutaneous edema and cellulitis.  CULTURES:  Influenza A : Negative Influenza B : Negtive Sars Coronavirus: Negative MRSA Nasopharyngeal negative 1/4 Blood cultures: 4/4 Positive for H. Influenza  Urine culture: No growth 1/6 Blood cultures: NGTD on 1/11 1/13 Tracheal aspirate: few klebsiella pneumoniae  1/16 R ankle aspirate : monosodium urate crystals, no organisms on gram stain; culture w/ no growth to date 1/27: Sputum culture>>> ANTIBIOTICS:  Bactrim 1/4 Azithromycin 1/4  Vancomycin 1/4 Zosyn  1/4 one dose Clindamycin 1/4 one dose Unasyn 1/5 >> 1/23   LINES/TUBES:  Tracheostomy - placed 1/4 Peripheral IV in Right Forearm - placed 1/9 Peripheral IV in Left Forearm - placed 1/9 PICC line double lumen - placed 1/15  CONSULTANTS:  Infectious Disease  ENT  Orthopedic surgery  SUBJECTIVE:  Still issues with delirium.  Somnolent from meds right now.  CONSTITUTIONAL: BP (!) 167/88 (BP Location: Left Arm)   Pulse (!) 134   Temp 98.4  F (36.9 C)   Resp 20   Ht 5\' 8"  (1.727 m)   Wt 69.6 kg   SpO2 99%   BMI 23.33 kg/m   I/O last 3 completed shifts: In: 164.2 [I.V.:40; NG/GT:120; IV Piggyback:4.2] Out: 2200 [Urine:2200]   FiO2 (%):  [28 %] 28 %   PHYSICAL EXAM: GEN: no acute distress HEENT: 4 cuffless in place, no secretions at present, on TC, talks with PMV without issue CV:  RRR, ext warm PULM: Scattered, rhonci, no accessory muscle use GI: Soft, +BS EXT: No edema NEURO: Moves all 4 ext to command PSYCH: RASS 0 SKIN: No rashes   ASSESSMENT AND PLAN   Severe Soft Tissue Cellulitis of left neck resulting in Acute Airway compromise s/p emergent percutaneous tracheostomy->completed 21 days Unasyn H. 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  Diffuse bilateral airspace disease (treated aspiration) Klebsiella bronchitis Left basilar atelectasis Residual acute metabolic encephalopathy  -Cap trial, continue CPT and hypertonic nebs -Continue to wean benzodiazepines:  0.25 klonipin TID to PRN today -D/C PRN ativan, use PO first and ativan only as last resort -QT 460, continue methadone/seroquel -7 days ceftriaxone for Klebsiella (1/29-2/5), can switch to keflex if no longer needs IV -Continue PT/OT/SLP working with him -If does well with cap tonight, decannulation in AM -Will follow with you  Linna Hoff  Tamala Julian MD PCCM

## 2019-07-07 NOTE — Progress Notes (Signed)
Occupational Therapy Treatment Patient Details Name: [name]. MRN: [id number] DOB: [birth year] Today's Date: 07/07/2019    History of present illness 59 year old man with a history of alcohol use disorder, tobacco used disorder, HTN, and recent admission on 04/10/2019 for pancreatitis and hyponatremia who presents for evaluation of shortness of breath and neck swelling.    OT comments  Pt up to chair this session, utilized dependent lift equipment. Pt lethargic, suspect due to meds. Opens eyes briefly at times with max stimuli. Total +2 to roll to each side. HR 113-118 throughout session, O2 in the upper 90s. D/c plan remains appropriate.   Follow Up Recommendations  SNF;Supervision/Assistance - 24 hour    Equipment Recommendations  Other (comment)( TBD at next venue of care)    Recommendations for Other Services      Precautions / Restrictions Precautions Precautions: Fall Precaution Comments: trach collar, bilat mittens; posey wrist restraints sometimes on, sometimes off depending on restlessness/pulling at lines Restrictions Weight Bearing Restrictions: No       Mobility Bed Mobility Overal bed mobility: Needs Assistance Bed Mobility: Rolling Rolling: Total assist;+2 for physical assistance            Transfers Overall transfer level: Needs assistance Equipment used: Ambulation equipment used   Sit to Stand: Total assist;+2 physical assistance              Balance                                           ADL either performed or assessed with clinical judgement   ADL Overall ADL's : Needs assistance/impaired                                       General ADL Comments: lethargic, suspect due to meds. briefly opens eyes but unable to follow commands     Vision       Perception     Praxis      Cognition Arousal/Alertness: Lethargic;Suspect due to medications                                      General Comments: briefly opens eyes to max stimuli. Observed to hold eyes open for a few seconds after lift transfer to recliner and raise arm in protective gesture. Otherwise, lethargic and unresponsive.        Exercises     Shoulder Instructions       General Comments HR 113-118 at rest and throughout session. O2 in upper 90s    Pertinent Vitals/ Pain       Pain Assessment: Faces Faces Pain Scale: No hurt  Home Living                                          Prior Functioning/Environment              Frequency  Min 2X/week        Progress Toward Goals  OT Goals(current goals can now be found in the care plan section)  Progress towards OT goals: Not progressing toward goals - comment(lethargic, suspect  due to meds)  Acute Rehab OT Goals Patient Stated Goal: unable to state OT Goal Formulation: Patient unable to participate in goal setting Time For Goal Achievement: 07/21/19 Potential to Achieve Goals: Fair ADL Goals Pt Will Perform Grooming: with mod assist;sitting;bed level Pt Will Perform Upper Body Bathing: with mod assist;sitting;bed level Pt/caregiver will Perform Home Exercise Program: Increased ROM;Both right and left upper extremity;Increased strength;With written HEP provided Additional ADL Goal #1: Patient will complete bed mobility with min assist and maintain sitting balance with min assist as precursor to ADLs. Additional ADL Goal #2: Patient will attend to tasks to complete 1-2 step commands in non distracting enviorment with no more than minimal cueing to maximize participation in ADLs.  Plan Discharge plan remains appropriate;Frequency remains appropriate    Co-evaluation    PT/OT/SLP Co-Evaluation/Treatment: Yes Reason for Co-Treatment: Complexity of the patient's impairments (multi-system involvement);For patient/therapist safety;To address functional/ADL transfers   OT goals addressed during session:  Strengthening/ROM;Proper use of Adaptive equipment and DME;ADL's and self-care      AM-PAC OT "6 Clicks" Daily Activity     Outcome Measure   Help from another person eating meals?: Total Help from another person taking care of personal grooming?: Total Help from another person toileting, which includes using toliet, bedpan, or urinal?: Total Help from another person bathing (including washing, rinsing, drying)?: Total Help from another person to put on and taking off regular upper body clothing?: Total Help from another person to put on and taking off regular lower body clothing?: Total 6 Click Score: 6    End of Session Equipment Utilized During Treatment: Other (comment);Oxygen(vent)  OT Visit Diagnosis: Other abnormalities of gait and mobility (R26.89);Muscle weakness (generalized) (M62.81);Other symptoms and signs involving cognitive function   Activity Tolerance Patient limited by lethargy   Patient Left in chair;with call bell/phone within reach;with nursing/sitter in room;with restraints reapplied   Nurse Communication Other (comment);Need for lift equipment(lethargic)        Time: [id number] OT Time Calculation (min): 45 min  Charges: OT General Charges $OT Visit: 1 Visit OT Treatments $Self Care/Home Management : 8-22 mins  Tyrone Schimke, OT Acute Rehabilitation Services Pager: [phone] Office: [phone]   Hortencia Pilar 07/07/2019, 11:19 AM

## 2019-07-07 NOTE — Progress Notes (Signed)
CPT not performed due to patient extreme agitation. NT at bedside. RT will continue to monitor.

## 2019-07-07 NOTE — Progress Notes (Addendum)
Pharmacy Antibiotic Note  [name]. is a 59 y.o. male with kelbsiella  pneumonia.  Pharmacy has been consulted for unasyn dosing.She was on unsyn previously for the same (last dose 1/23).  -WBC= 13.7, afebrile, CrCl ~ 80  CCM starting ceftriaxone (sensitivity noted) for 7 days  Plan: -Continue ceftriaxone -Will sign off. Please contact pharmacy with any other needs.  Thank you  Hildred Laser, PharmD Clinical Pharmacist **Pharmacist phone directory can now be found on [email or web address] ([email or web address]).  Listed under [place].

## 2019-07-07 NOTE — Progress Notes (Addendum)
PROGRESS NOTE    [name].  [id number] DOB: 1961/04/16 DOA: 06/12/2019 PCP: Alvester Chou, NP   Brief Narrative: 59 year old with past medical history significant for alcohol use disorder, tobacco, hypertension recent admission in 06/09/2018 for pancreatitis and hyponatremia who presents for evaluation of shortness of breath and neck swelling.  Reported to have it started 5 days ago and progressively gotten worse.  Patient tachycardic to 160 and tachypneic to the 40s in the ED.  Seen in the ED, patient says he has neck swelling, cough and difficulty breathing.  Admitted to ICU on 1/4 with loss of airway and PEA arrest requiring emergent tracheostomy.  Patient was  difficult to wean off of sedation.  He has been off of Precedex.  He was a started on Klonopin on 1/19.  Patient was transitioned to trach collar on 1/20.   CT of the neck soft tissue with contrast on 06/12/2019 showed left greater than right facial infrahyoid neck subcutaneous edema and inflammatory changes extending into the left anterior chest wall there is no fluid collection, but there is possible pharyngeal and supraglottic laryngeal wall edema.   Assessment & Plan:   Principal Problem:   Neck infection Active Problems:   Alcohol abuse   Poor dentition   Septic shock (HCC)   Laryngeal edema   Multifocal pneumonia   Airway compromise   Chronic hepatitis C without hepatic coma (HCC)   Acute respiratory failure with hypoxemia (HCC)   Cardiac arrest, cause unspecified (HCC)   Haemophilis influenzae bacteremia    Malnutrition of moderate degree   Atelectasis   Tracheostomy care (HCC)   Acute metabolic encephalopathy  1-Severe soft tissue cellulitis/acute respiratory failure with hypoxemia, acute airway compromise status post emergent percutaneous tracheostomy/Haemophilus influenza bacteremia/multifocal pneumonia: -4 out of 4 blood cultures grew Haemophilus influenza beta-lactamase negative, completed course of IV  Unasyn of 21 days on 07/01/2019 -He has been able to tolerate trach collar, he was changed to a smaller size.  CCM following. -Follow sputum culture: Growing Klebsiella Pneumonia . WBC increase, secretions increase yesterday. -will add Unasyn, klebsiella was sensitive to Unasyn.   2-Acute confusional state/alcohol use disorder: Resolving Alert and oriented and following some commands. On Seroquel twice daily, methadone 3 times daily Klonopin 3 times daily Continue to wean benzo as able. Still agitated at times. MRI negative for acute stroke. Delirium precaution, out of bed.   3-Intermittent SVT: Started on metoprolol 50 twice daily As needed doses now that he does not have any core track Replete magnesium  4-generalized edema/anasarca His net  negative.  Holding Lasix  5-right upper extremity edema: Resolving Right upper extremity ultrasound showed superficial vein thrombosis. Local care.  6-Urinary retention:   He will need voiding trial. Will try bethanecol.   7-Right ankle gout: Received steroid taper  8-Dysphagia, moderate caloric malnutrition: cortrack place again 1-29, resume tube feeding  He failed a swallow evaluation.  9-Cardiac arrest, cause unspecified 10-Chronic hepatitis C without hepatic coma   Nutrition Problem: Moderate Malnutrition Etiology: chronic illness(COPD, alcoholism)    Signs/Symptoms: mild fat depletion, moderate fat depletion, mild muscle depletion    Interventions: Tube feeding  Estimated body mass index is 23.33 kg/m as calculated from the following:   Height as of this encounter: 5\' 8"  (1.727 m).   Weight as of this encounter: 69.6 kg.   DVT prophylaxis: Heparin  Code Status: Full code Family Communication: No family at bedside Disposition Plan: Patient from home.  He will require skilled nursing facility.  He is still requiring core track for nutrition, still with delirium and agitation at times. Consultants:    CCM    Procedures/ Events:  Admit to ICU on 1/4  1/4 : Lost airway and PEA arrest on, requiring emergent tracheostomy and ROSC obtained 1/5: 4/4 Blood cultures growing H.Influenza , narrowed to Unasyn 1/5 > 1/14: Continued on antibiotics without any significant improvement  1/14: New right foot swelling for which MRI obtained due to concerns for seeding of cellulitis from neck 1/15: MRI with synovitis and subcutaneous edema suggestive of cellulitis; orthopedics consulted for right ankle aspiration - aspirate with positive crystals suggestive of gout - patient started on NSAIDs and steroids 1/16 Started on dilaudid, D/C fentanyl  1/17: Patient agitated early AM, q2h prn versed prescribed 1/18: Hb 5.2, transfused 2u pRBC; started methadone  1/19: Klonopin increased to tid and metoprolol added for SVTs 1/20: precedex gtt discontinued overnight for concerns of bradycardia  1/21: clonidine taper, 24hr trach collar 1/22: continuing trach collar 1/23: last day of unasyn for cellulitis, steroid taper and colchicine for gout, continuing trach collar  1/24: 72 hours of trach collar 1/25 changed to 4 cuffless 1/26 sedation cut back  Cultures;  Influenza A : Negative Influenza B : Negtive Sars Coronavirus: Negative MRSA Nasopharyngeal negative 1/4 Blood cultures: 4/4 Positive for H. Influenza  Urine culture: No growth 1/6 Blood cultures: NGTD on 1/11 1/13 Tracheal aspirate: few klebsiella pneumoniae  1/16 R ankle aspirate : monosodium urate crystals, no organisms on gram stain; culture w/ no growth to date 1/27: Sputum culture>>>  Antibiotics;  Bactrim 1/4 Azithromycin 1/4  Vancomycin 1/4 Zosyn  1/4 one dose Clindamycin 1/4 one dose Unasyn 1/5 >> 1/23   Subjective: Lying in bed, worsening cough and secretions.  Vomited bilious secretions last night.   Objective: Vitals:   07/07/19 0115 07/07/19 0403 07/07/19 0753 07/07/19 0941  BP:   (!) 167/88   Pulse: 88  (!) 134   Resp:  15  20   Temp:  98.4 F (36.9 C) 99.9 F (37.7 C) 98.4 F (36.9 C)  TempSrc:  Oral Oral   SpO2: 99%     Weight:  69.6 kg    Height:        Intake/Output Summary (Last 24 hours) at 07/07/2019 1445 Last data filed at 07/07/2019 [id number] Gross per 24 hour  Intake 124.23 ml  Output 1350 ml  Net -1225.77 ml   Filed Weights   07/05/19 0439 07/06/19 0350 07/07/19 0403  Weight: 73 kg 69.8 kg 69.6 kg    Examination:  General exam: Alert Respiratory system: Bilateral ronchus, trach in place  Cardiovascular system: S 1, S 2 RRR Gastrointestinal system: BS present, soft, nt Central nervous system: alert, confuse, agitates at time, follow some command Extremities: no edema Skin: no rashes  Data Reviewed: I have personally reviewed following labs and imaging studies  CBC: Recent Labs  Lab 07/01/19 0514 07/02/19 0403 07/03/19 0433 07/06/19 0850 07/07/19 1216  WBC 7.7 7.5 8.4 8.8 13.7*  HGB 10.9* 11.3* 11.8* 12.4* 12.4*  HCT 37.7* 38.9* 40.4 42.0 41.2  MCV 85.9 85.5 85.6 84.0 83.7  PLT 573* 521* 504* 444* [id number]   Basic Metabolic Panel: Recent Labs  Lab 07/01/19 0514 07/02/19 0403 07/03/19 0433 07/04/19 0545 07/05/19 0424 07/06/19 0850 07/07/19 1216  NA 141   < > 136 138 136 137 141  K 4.0   < > 4.3 4.4 4.5 4.0 4.4  CL 100   < > 94* 96*  94* 95* 100  CO2 31   < > 31 32 32 29 29  GLUCOSE 111*   < > 106* 116* 115* 121* 119*  BUN 16   < > 19 17 18 16 17   CREATININE 0.68   < > 0.94 0.62 0.67 0.80 0.95  CALCIUM 8.9   < > 9.3 9.5 9.5 10.2 10.5*  MG 2.0  --   --   --   --  1.8  --    < > = values in this interval not displayed.   GFR: Estimated Creatinine Clearance: 83 mL/min (by C-G formula based on SCr of 0.95 mg/dL). Liver Function Tests: No results for input(s): AST, ALT, ALKPHOS, BILITOT, PROT, ALBUMIN in the last 168 hours. No results for input(s): LIPASE, AMYLASE in the last 168 hours. No results for input(s): AMMONIA in the last 168 hours. Coagulation Profile: No  results for input(s): INR, PROTIME in the last 168 hours. Cardiac Enzymes: No results for input(s): CKTOTAL, CKMB, CKMBINDEX, TROPONINI in the last 168 hours. BNP (last 3 results) No results for input(s): PROBNP in the last 8760 hours. HbA1C: No results for input(s): HGBA1C in the last 72 hours. CBG: Recent Labs  Lab 07/06/19 2048 07/07/19 0117 07/07/19 0404 07/07/19 0751 07/07/19 1105  GLUCAP 107* 100* 117* 114* 137*   Lipid Profile: No results for input(s): CHOL, HDL, LDLCALC, TRIG, CHOLHDL, LDLDIRECT in the last 72 hours. Thyroid Function Tests: No results for input(s): TSH, T4TOTAL, FREET4, T3FREE, THYROIDAB in the last 72 hours. Anemia Panel: No results for input(s): VITAMINB12, FOLATE, FERRITIN, TIBC, IRON, RETICCTPCT in the last 72 hours. Sepsis Labs: No results for input(s): PROCALCITON, LATICACIDVEN in the last 168 hours.  Recent Results (from the past 240 hour(s))  Culture, respiratory (non-expectorated)     Status: None (Preliminary result)   Collection Time: 07/05/19 11:53 AM   Specimen: Tracheal Aspirate; Respiratory  Result Value Ref Range Status   Specimen Description TRACHEAL ASPIRATE  Final   Special Requests NONE  Final   Gram Stain   Final    MODERATE WBC PRESENT,BOTH PMN AND MONONUCLEAR RARE GRAM POSITIVE COCCI IN PAIRS    Culture   Final    RARE KLEBSIELLA PNEUMONIAE CULTURE REINCUBATED FOR BETTER GROWTH Performed at [facility], [place] [address], [place], [place] [address]    Report Status PENDING  Incomplete   Organism ID, Bacteria KLEBSIELLA PNEUMONIAE  Final      Susceptibility   Klebsiella pneumoniae - MIC*    AMPICILLIN >=32 RESISTANT Resistant     CEFAZOLIN <=4 SENSITIVE Sensitive     CEFEPIME <=0.12 SENSITIVE Sensitive     CEFTAZIDIME <=1 SENSITIVE Sensitive     CEFTRIAXONE <=0.25 SENSITIVE Sensitive     CIPROFLOXACIN <=0.25 SENSITIVE Sensitive     GENTAMICIN <=1 SENSITIVE Sensitive     IMIPENEM <=0.25 SENSITIVE Sensitive      TRIMETH/SULFA <=20 SENSITIVE Sensitive     AMPICILLIN/SULBACTAM 8 SENSITIVE Sensitive     PIP/TAZO 16 SENSITIVE Sensitive     * RARE KLEBSIELLA PNEUMONIAE         Radiology Studies: DG Abd 1 View  Result Date: 07/07/2019 CLINICAL DATA:  Nausea, vomiting EXAM: ABDOMEN - 1 VIEW COMPARISON:  07/06/2019 FINDINGS: Nonobstructive pattern of bowel gas with gas present to the rectum. There has been interval transit of enteric contrast material, which is now present in the ascending, transverse, and proximal descending colon. No free air in the abdomen. Esophagogastric tube may have been retracted in  the interval, although the position of the side port relative to the diaphragm is not well imaged. IMPRESSION: 1. Nonobstructive pattern of bowel gas with gas present to the rectum. There has been interval transit of enteric contrast material, which is now present in the ascending, transverse, and proximal descending colon. 2. Esophagogastric tube may have been retracted in the interval, although the position of the side port relative to the diaphragm is not well imaged. Correlate for return and consider advancement. Electronically Signed   By: Eddie Candle M.D.   On: 07/07/2019 09:01   DG CHEST PORT 1 VIEW  Result Date: 07/07/2019 CLINICAL DATA:  Cough. EXAM: PORTABLE CHEST 1 VIEW COMPARISON:  Chest x-ray dated July 03, 2019. FINDINGS: Unchanged tracheostomy tube. Unchanged right upper extremity PICC line. Interval exchange of the feeding tube for enteric tube, with the tip just inside the stomach and the proximal side port in the lower esophagus. Stable cardiomediastinal silhouette. Bilateral predominantly interstitial opacities have improved. Unchanged small left pleural effusion with adjacent left basilar opacity, likely atelectasis. Resolved right pleural effusion. No pneumothorax. No acute osseous abnormality. IMPRESSION: 1. New enteric tube with proximal side port in the lower esophagus. Recommend  advancing at least 8 cm. 2. Improved pulmonary edema and right pleural effusion. Unchanged small left pleural effusion. Electronically Signed   By: Titus Dubin M.D.   On: 07/07/2019 09:01   DG Abd Portable 1V  Result Date: 07/06/2019 CLINICAL DATA:  Nasogastric tube placement EXAM: PORTABLE ABDOMEN - 1 VIEW COMPARISON:  Portable exam 1545 hours compared to 06/22/2019 FINDINGS: Tip of nasogastric tube projects over proximal stomach. Atelectasis versus consolidation LEFT lower lobe. Small amount retained contrast in small bowel loops in the RIGHT mid abdomen. Bowel gas pattern otherwise unremarkable. Degenerative changes spine. IMPRESSION: Tip of nasogastric tube projects over proximal stomach. Electronically Signed   By: Lavonia Dana M.D.   On: 07/06/2019 16:04   DG Swallowing Func-Speech Pathology  Result Date: 07/06/2019 Objective Swallowing Evaluation: Type of Study: MBS-Modified Barium Swallow Study  Patient Details Name: Khazi Breckenridge. MRN: [id number] Date of Birth: Dec 24, 1960 Today's Date: 07/06/2019 Time: SLP Start Time (ACUTE ONLY): 0940 -SLP Stop Time (ACUTE ONLY): 0957 SLP Time Calculation (min) (ACUTE ONLY): 17 min Past Medical History: Past Medical History: Diagnosis Date . Alcohol abuse  . Alcoholic ([place])  . Back pain  . CAP (community acquired pneumonia) 11/26/2018 . COPD (chronic obstructive pulmonary disease) ([place])  . Empyema of right pleural space ([place]) 11/26/2018 . Heavy smoker  . Hep C w/ coma, chronic ([place])  . Loose, teeth  . Pancreatitis  . Poor dentition  . Tobacco abuse  Past Surgical History: Past Surgical History: Procedure Laterality Date . ARTERY REPAIR  2013  neck . BIOPSY  02/19/2019  Procedure: BIOPSY;  Surgeon: Ronnette Juniper, MD;  Location: [facility] ENDOSCOPY;  Service: Gastroenterology;; . ESOPHAGOGASTRODUODENOSCOPY (EGD) WITH PROPOFOL N/A 02/19/2019  Procedure: ESOPHAGOGASTRODUODENOSCOPY (EGD) WITH PROPOFOL;  Surgeon: Ronnette Juniper, MD;  Location: [place];  Service:  Gastroenterology;  Laterality: N/A; . FOREIGN BODY REMOVAL  02/19/2019  Procedure: FOREIGN BODY REMOVAL;  Surgeon: Ronnette Juniper, MD;  Location: [facility] ENDOSCOPY;  Service: Gastroenterology;; . KNEE SURGERY  2013  rt . MICROLARYNGOSCOPY N/A 07/31/2014  Procedure: MICROLARYNGOSCOPY WITH BIOPSY ;  Surgeon: Rozetta Nunnery, MD;  Location: [place];  Service: ENT;  Laterality: N/A; . NECK SURGERY  2013  cervical fusion- . VASCULAR SURGERY   . VIDEO ASSISTED THORACOSCOPY (VATS)/EMPYEMA Right 11/26/2018  Procedure: VIDEO ASSISTED  THORACOSCOPY (VATS)/EMPYEMA;  Surgeon: Rexene Alberts, MD;  Location: [place];  Service: Thoracic;  Laterality: Right; [name] VIDEO BRONCHOSCOPY  11/26/2018  Procedure: Video Bronchoscopy;  Surgeon: Rexene Alberts, MD;  Location: [place];  Service: Thoracic;; HPI: 59 year old man with a history of alcohol use disorder, tobacco use disorder, HTN, and recent admission on 04/10/2019 for pancreatitis and hyponatremia who presents for evaluation of shortness of breath and neck swelling with loss of airway and PEA arrest requiring emergent tracheostomy.  Subjective: Pt was alert and cooperative Assessment / Plan / Recommendation CHL IP CLINICAL IMPRESSIONS 07/06/2019 Clinical Impression Pt presents with severe oropharyngeal dysphagia c/b lingual discoordination, premature spillage, reduced base of tonge retraction, decreased pharyngeal peristalsis, incomplete laryngeal closure, decreased hyolaryngeal excusion, and diminished sensation.  These deficits results in silent aspiration of thin and nectar thick liquids during and after the swallow, penetration of honey thick liquid, and significant vallecular stasis of puree consistency.  Aspiration of thin and nectar thick liquids was not always sensed, and pt was unable to follow direction for cued cough.  Reflexive cough was not beneficial to clear aspiration.  There was a moderate amount of aspiration at both anterior and posterior trachea.  Honey  thick liquid was penetrated without full clearance, and appeared to progress to the level of the vocal folds; however, there was some residue in laryngeal vestibule from prior trials as well, which may have mixed with honey thick liquid making it thinner.  With puree, there was no prandial penetration or aspiration, but there was severe vallecular residue which was partially cleared with honey thick liquid wash.  Pt is at risk for aspiration of residueal puree, especially as it mixes with thin secretions. All bolus trials were given with PMV in place.  Pt is a poor candidate for swallowing therapy at this time 2/2 inability to follow directions.  Consider repeat evaluation early next week or as overall status improves. Recommend pt remain NPO with alternate means of nutrition, hydration, and medication.  Pt currently does not have AMN as he has pulled cortrak. If pt is kept PO overnight for cortrak team, and there are priority oral meds that must be given, please crush with puree. SLP Visit Diagnosis Dysphagia, oropharyngeal phase (R13.12) Attention and concentration deficit following -- Frontal lobe and executive function deficit following -- Impact on safety and function --   CHL IP TREATMENT RECOMMENDATION 07/06/2019 Treatment Recommendations Therapy as outlined in treatment plan below   Prognosis 07/06/2019 Prognosis for Safe Diet Advancement Good Barriers to Reach Goals -- Barriers/Prognosis Comment -- CHL IP DIET RECOMMENDATION 07/06/2019 SLP Diet Recommendations NPO;Alternative means - temporary Liquid Administration via -- Medication Administration Via alternative means Compensations -- Postural Changes --   CHL IP OTHER RECOMMENDATIONS 07/06/2019 Recommended Consults -- Oral Care Recommendations Oral care QID Other Recommendations --   CHL IP FOLLOW UP RECOMMENDATIONS 07/06/2019 Follow up Recommendations Skilled Nursing facility   [facility] IP FREQUENCY AND DURATION 07/06/2019 Speech Therapy Frequency (ACUTE ONLY) min  2x/week Treatment Duration 2 weeks      CHL IP ORAL PHASE 07/06/2019 Oral Phase Impaired Oral - Pudding Teaspoon -- Oral - Pudding Cup -- Oral - Honey Teaspoon -- Oral - Honey Cup Decreased bolus cohesion;Premature spillage Oral - Nectar Teaspoon -- Oral - Nectar Cup Decreased bolus cohesion;Premature spillage Oral - Nectar Straw -- Oral - Thin Teaspoon -- Oral - Thin Cup Decreased bolus cohesion;Premature spillage Oral - Thin Straw -- Oral - Puree Premature spillage Oral - Mech Soft --  Oral - Regular -- Oral - Multi-Consistency -- Oral - Pill -- Oral Phase - Comment --  CHL IP PHARYNGEAL PHASE 07/06/2019 Pharyngeal Phase Impaired Pharyngeal- Pudding Teaspoon -- Pharyngeal -- Pharyngeal- Pudding Cup -- Pharyngeal -- Pharyngeal- Honey Teaspoon -- Pharyngeal -- Pharyngeal- Honey Cup Delayed swallow initiation-pyriform sinuses;Reduced tongue base retraction;Reduced airway/laryngeal closure;Reduced laryngeal elevation;Reduced anterior laryngeal mobility;Penetration/Aspiration during swallow;Penetration/Apiration after swallow;Pharyngeal residue - valleculae;Pharyngeal residue - pyriform Pharyngeal Material enters airway, remains ABOVE vocal cords and not ejected out Pharyngeal- Nectar Teaspoon -- Pharyngeal -- Pharyngeal- Nectar Cup Delayed swallow initiation-pyriform sinuses;Reduced anterior laryngeal mobility;Reduced laryngeal elevation;Reduced airway/laryngeal closure;Reduced tongue base retraction;Penetration/Aspiration during swallow;Penetration/Apiration after swallow;Moderate aspiration;Pharyngeal residue - valleculae;Reduced pharyngeal peristalsis Pharyngeal Material enters airway, passes BELOW cords without attempt by patient to eject out (silent aspiration) Pharyngeal- Nectar Straw -- Pharyngeal -- Pharyngeal- Thin Teaspoon -- Pharyngeal -- Pharyngeal- Thin Cup Reduced anterior laryngeal mobility;Reduced laryngeal elevation;Reduced airway/laryngeal closure;Reduced tongue base retraction;Penetration/Aspiration  during swallow;Penetration/Apiration after swallow;Moderate aspiration;Pharyngeal residue - valleculae;Delayed swallow initiation-vallecula Pharyngeal Material enters airway, passes BELOW cords without attempt by patient to eject out (silent aspiration) Pharyngeal- Thin Straw -- Pharyngeal -- Pharyngeal- Puree Delayed swallow initiation-vallecula;Reduced tongue base retraction;Pharyngeal residue - pyriform Pharyngeal Material does not enter airway Pharyngeal- Mechanical Soft -- Pharyngeal -- Pharyngeal- Regular -- Pharyngeal -- Pharyngeal- Multi-consistency -- Pharyngeal -- Pharyngeal- Pill -- Pharyngeal -- Pharyngeal Comment --  CHL IP CERVICAL ESOPHAGEAL PHASE 07/06/2019 Cervical Esophageal Phase WFL Pudding Teaspoon -- Pudding Cup -- Honey Teaspoon -- Honey Cup -- Nectar Teaspoon -- Nectar Cup -- Nectar Straw -- Thin Teaspoon -- Thin Cup -- Thin Straw -- Puree -- Mechanical Soft -- Regular -- Multi-consistency -- Pill -- Cervical Esophageal Comment -- Leigh E Borum 07/06/2019, 10:51 AM                   Scheduled Meds: . chlorhexidine  15 mL Mouth Rinse BID  . Chlorhexidine Gluconate Cloth  6 each Topical Daily  . clonazepam  0.25 mg Per Tube TID  . famotidine  20 mg Per Tube BID  . folic acid  1 mg Intravenous Daily  . free water  200 mL Per Tube Q8H  . gabapentin  900 mg Per Tube TID  . guaiFENesin  15 mL Per Tube Q6H  . heparin injection (subcutaneous)  5,000 Units Subcutaneous Q8H  . insulin aspart  0-15 Units Subcutaneous Q4H  . mouth rinse  15 mL Mouth Rinse q12n4p  . methadone  10 mg Per Tube TID  . metoprolol tartrate  50 mg Per Tube BID  . nicotine  21 mg Transdermal Daily  . polyethylene glycol  17 g Per Tube Daily  . QUEtiapine  200 mg Per Tube BID  . sodium chloride flush  10-40 mL Intracatheter Q12H  . thiamine  100 mg Per Tube Daily   Continuous Infusions: . sodium chloride Stopped (07/02/19 1610)  . feeding supplement (VITAL AF 1.2 CAL) 65 mL/hr at 07/05/19 0600      LOS: 25 days    Time spent: 35 minutes.     Elmarie Shiley, MD [facility]  If 7PM-7AM, please contact night-coverage  07/07/2019, 2:45 PM

## 2019-07-08 ENCOUNTER — Inpatient Hospital Stay (HOSPITAL_COMMUNITY): Payer: Medicaid Other

## 2019-07-08 DIAGNOSIS — R0689 Other abnormalities of breathing: Secondary | ICD-10-CM

## 2019-07-08 LAB — CULTURE, RESPIRATORY W GRAM STAIN

## 2019-07-08 LAB — POCT I-STAT 7, (LYTES, BLD GAS, ICA,H+H)
Acid-Base Excess: 6 mmol/L — ABNORMAL HIGH (ref 0.0–2.0)
Bicarbonate: 29.7 mmol/L — ABNORMAL HIGH (ref 20.0–28.0)
Calcium, Ion: 1.3 mmol/L (ref 1.15–1.40)
HCT: 37 % — ABNORMAL LOW (ref 39.0–52.0)
Hemoglobin: 12.6 g/dL — ABNORMAL LOW (ref 13.0–17.0)
O2 Saturation: 100 %
Patient temperature: 98.2
Potassium: 4 mmol/L (ref 3.5–5.1)
Sodium: 142 mmol/L (ref 135–145)
TCO2: 31 mmol/L (ref 22–32)
pCO2 arterial: 37.1 mmHg (ref 32.0–48.0)
pH, Arterial: 7.51 — ABNORMAL HIGH (ref 7.350–7.450)
pO2, Arterial: 307 mmHg — ABNORMAL HIGH (ref 83.0–108.0)

## 2019-07-08 LAB — CBC
HCT: 42.2 % (ref 39.0–52.0)
Hemoglobin: 12.3 g/dL — ABNORMAL LOW (ref 13.0–17.0)
MCH: 25.5 pg — ABNORMAL LOW (ref 26.0–34.0)
MCHC: 29.1 g/dL — ABNORMAL LOW (ref 30.0–36.0)
MCV: 87.4 fL (ref 80.0–100.0)
Platelets: 340 10*3/uL (ref 150–400)
RBC: 4.83 MIL/uL (ref 4.22–5.81)
RDW: 18.9 % — ABNORMAL HIGH (ref 11.5–15.5)
WBC: 15 10*3/uL — ABNORMAL HIGH (ref 4.0–10.5)
nRBC: 0 % (ref 0.0–0.2)

## 2019-07-08 LAB — BASIC METABOLIC PANEL
Anion gap: 11 (ref 5–15)
BUN: 23 mg/dL — ABNORMAL HIGH (ref 6–20)
CO2: 30 mmol/L (ref 22–32)
Calcium: 9.8 mg/dL (ref 8.9–10.3)
Chloride: 101 mmol/L (ref 98–111)
Creatinine, Ser: 1.13 mg/dL (ref 0.61–1.24)
GFR calc Af Amer: >60 mL/min (ref >60)
GFR calc non Af Amer: >60 mL/min (ref >60)
Glucose, Bld: 155 mg/dL — ABNORMAL HIGH (ref 70–99)
Potassium: 4.3 mmol/L (ref 3.5–5.1)
Sodium: 142 mmol/L (ref 135–145)

## 2019-07-08 LAB — BLOOD GAS, ARTERIAL
Acid-Base Excess: 4.8 mmol/L — ABNORMAL HIGH (ref 0.0–2.0)
Bicarbonate: 32.4 mmol/L — ABNORMAL HIGH (ref 20.0–28.0)
Drawn by: 57060
FIO2: 40
O2 Saturation: 96.8 %
Patient temperature: 36.8
pCO2 arterial: 84.4 mmHg (ref 32.0–48.0)
pH, Arterial: 7.207 — ABNORMAL LOW (ref 7.350–7.450)
pO2, Arterial: 113 mmHg — ABNORMAL HIGH (ref 83.0–108.0)

## 2019-07-08 LAB — GLUCOSE, CAPILLARY
Glucose-Capillary: 113 mg/dL — ABNORMAL HIGH (ref 70–99)
Glucose-Capillary: 116 mg/dL — ABNORMAL HIGH (ref 70–99)
Glucose-Capillary: 89 mg/dL (ref 70–99)
Glucose-Capillary: 92 mg/dL (ref 70–99)
Glucose-Capillary: 107 mg/dL — ABNORMAL HIGH (ref 70–99)

## 2019-07-08 LAB — AMMONIA: Ammonia: 29 umol/L (ref 9–35)

## 2019-07-08 MED ORDER — DEXMEDETOMIDINE HCL IN NACL 400 MCG/100ML IV SOLN
0.4000 ug/kg/h | INTRAVENOUS | Status: DC
  Administered 2019-07-08: 1.2 ug/kg/h via INTRAVENOUS
  Administered 2019-07-08: 0.4 ug/kg/h via INTRAVENOUS
  Administered 2019-07-09 (×5): 1.2 ug/kg/h via INTRAVENOUS
  Administered 2019-07-10: 1 ug/kg/h via INTRAVENOUS
  Administered 2019-07-10 (×3): 1.2 ug/kg/h via INTRAVENOUS
  Administered 2019-07-11: 1.1 ug/kg/h via INTRAVENOUS
  Administered 2019-07-11: 1.3 ug/kg/h via INTRAVENOUS
  Administered 2019-07-11 – 2019-07-12 (×4): 1.2 ug/kg/h via INTRAVENOUS
  Administered 2019-07-12 (×2): 1.4 ug/kg/h via INTRAVENOUS
  Administered 2019-07-12: 1.6 ug/kg/h via INTRAVENOUS
  Administered 2019-07-12 (×2): 1.2 ug/kg/h via INTRAVENOUS
  Administered 2019-07-13 (×6): 1.6 ug/kg/h via INTRAVENOUS
  Administered 2019-07-13: 10:00:00 1.2 ug/kg/h via INTRAVENOUS
  Administered 2019-07-14 (×4): 1.6 ug/kg/h via INTRAVENOUS
  Administered 2019-07-14: 1.156 ug/kg/h via INTRAVENOUS
  Administered 2019-07-14 – 2019-07-15 (×8): 1.6 ug/kg/h via INTRAVENOUS
  Administered 2019-07-16: 1 ug/kg/h via INTRAVENOUS
  Administered 2019-07-16: 1.5 ug/kg/h via INTRAVENOUS
  Administered 2019-07-16: 1.6 ug/kg/h via INTRAVENOUS
  Administered 2019-07-16: 0.6 ug/kg/h via INTRAVENOUS
  Administered 2019-07-17: 12:00:00 0.4 ug/kg/h via INTRAVENOUS
  Administered 2019-07-17: 1 ug/kg/h via INTRAVENOUS
  Filled 2019-07-08 (×3): qty 100
  Filled 2019-07-08: qty 200
  Filled 2019-07-08 (×15): qty 100
  Filled 2019-07-08: qty 200
  Filled 2019-07-08: qty 100
  Filled 2019-07-08: qty 200
  Filled 2019-07-08 (×12): qty 100
  Filled 2019-07-08 (×2): qty 200
  Filled 2019-07-08 (×7): qty 100

## 2019-07-08 MED ORDER — NALOXONE HCL 0.4 MG/ML IJ SOLN
0.4000 mg | Freq: Once | INTRAMUSCULAR | Status: AC
  Administered 2019-07-08: 0.4 mg via INTRAVENOUS
  Filled 2019-07-08 (×2): qty 1

## 2019-07-08 MED ORDER — QUETIAPINE FUMARATE 50 MG PO TABS: 200.0000 mg | ORAL_TABLET | Freq: Every day | ORAL | Status: DC

## 2019-07-08 MED ORDER — FENTANYL CITRATE (PF) 100 MCG/2ML IJ SOLN
100.0000 ug | Freq: Once | INTRAMUSCULAR | Status: AC
  Administered 2019-07-08: 100 ug via INTRAVENOUS

## 2019-07-08 MED ORDER — METOPROLOL TARTRATE 25 MG/10 ML ORAL SUSPENSION
75.0000 mg | Freq: Two times a day (BID) | ORAL | Status: DC
  Administered 2019-07-08 – 2019-07-28 (×40): 75 mg
  Filled 2019-07-08 (×45): qty 30

## 2019-07-08 MED ORDER — FENTANYL 2500MCG IN NS 250ML (10MCG/ML) PREMIX INFUSION
0.0000 ug/h | INTRAVENOUS | Status: DC
  Administered 2019-07-08: 75 ug/h via INTRAVENOUS
  Administered 2019-07-09: 250 ug/h via INTRAVENOUS
  Administered 2019-07-10: 100 ug/h via INTRAVENOUS
  Administered 2019-07-10: 350 ug/h via INTRAVENOUS
  Administered 2019-07-11 (×3): 300 ug/h via INTRAVENOUS
  Administered 2019-07-12: 400 ug/h via INTRAVENOUS
  Administered 2019-07-12: 200 ug/h via INTRAVENOUS
  Administered 2019-07-12 – 2019-07-13 (×3): 400 ug/h via INTRAVENOUS
  Filled 2019-07-08 (×12): qty 250

## 2019-07-08 MED ORDER — ETOMIDATE 2 MG/ML IV SOLN
20.0000 mg | Freq: Once | INTRAVENOUS | Status: AC
  Administered 2019-07-08: 20 mg via INTRAVENOUS

## 2019-07-08 MED ORDER — ORAL CARE MOUTH RINSE
15.0000 mL | OROMUCOSAL | Status: DC
  Administered 2019-07-08 – 2019-08-04 (×222): 15 mL via OROMUCOSAL

## 2019-07-08 MED ORDER — GABAPENTIN 250 MG/5ML PO SOLN: 600.0000 mg | Freq: Two times a day (BID) | ORAL | Status: DC

## 2019-07-08 MED ORDER — ROCURONIUM BROMIDE 50 MG/5ML IV SOLN
50.0000 mg | Freq: Once | INTRAVENOUS | Status: AC
  Administered 2019-07-08: 50 mg via INTRAVENOUS
  Filled 2019-07-08: qty 5

## 2019-07-08 MED ORDER — MIDAZOLAM HCL 2 MG/2ML IJ SOLN
2.0000 mg | Freq: Once | INTRAMUSCULAR | Status: AC
  Administered 2019-07-08: 2 mg via INTRAVENOUS

## 2019-07-08 MED ORDER — CHLORHEXIDINE GLUCONATE 0.12% ORAL RINSE (MEDLINE KIT)
15.0000 mL | Freq: Two times a day (BID) | OROMUCOSAL | Status: DC
  Administered 2019-07-08 – 2019-08-04 (×50): 15 mL via OROMUCOSAL

## 2019-07-08 MED ORDER — METHADONE HCL 10 MG PO TABS: 10.0000 mg | ORAL_TABLET | Freq: Every day | ORAL | Status: DC

## 2019-07-08 MED ORDER — FENTANYL CITRATE (PF) 100 MCG/2ML IJ SOLN
INTRAMUSCULAR | Status: AC
  Filled 2019-07-08: qty 2

## 2019-07-08 MED ORDER — MIDAZOLAM HCL 2 MG/2ML IJ SOLN
INTRAMUSCULAR | Status: AC
  Filled 2019-07-08: qty 2

## 2019-07-08 NOTE — Procedures (Signed)
Percutaneous Tracheostomy Placement  Emergently trached due to AMS and compromised airway.  Patient sedated, paralyzed and position.  Placed on 100% FiO2 and RR matched.  Area cleaned and draped.  Lidocaine/epi injected.  Skin incision done followed by blunt dissection.  Trachea palpated then punctured, catheter passed and visualized bronchoscopically.  Wire placed and visualized.  Catheter removed.  Airway then entered and dilated.  Size 6 cuffed shiley trach placed and visualized bronchoscopically well above carina.  Good volume returns.  Patient tolerated the procedure well without complications.  Minimal blood loss.  CXR ordered and pending.  [name], M.D. [facility] Pulmonary/Critical Care Medicine. Pager: [phone]. After hours pager: [phone].

## 2019-07-08 NOTE — Progress Notes (Signed)
PULMONARY / CRITICAL CARE MEDICINE  NAME:  William Litwiller., MRN:  [id number], DOB:  [birth year], LOS: 7 ADMISSION DATE:  06/12/2019, CONSULTATION DATE:  06/12/2019 REFERRING MD:  Alinda Sierras, CHIEF COMPLAINT:  Shortness of breath, sore throat, and neck swelling  BRIEF HISTORY:    59 year old man with a history of alcohol use disorder, tobacco used disorder, HTN, and recent admission on 04/10/2019 for pancreatitis and hyponatremia who presents for evaluation of shortness of breath and neck swelling. Reported  to have started 5 days ago and progressively gotten worse. Patient tachycardic to 160's and tachypnic to the 40's in the ED. Seen in ED, patient says he has neck swelling , cough, and difficulty breathing. Admitted to ICU on 1/4 with loss of airway and PEA arrest requiring emergent tracheostomy. Patient has been difficult to wean off sedation   SIGNIFICANT PAST MEDICAL HISTORY    Past Medical History:  Diagnosis Date  . Alcohol abuse   . Alcoholic ([place])   . Back pain   . CAP (community acquired pneumonia) 11/26/2018  . COPD (chronic obstructive pulmonary disease) ([place])   . Empyema of right pleural space ([place]) 11/26/2018  . Heavy smoker   . Hep C w/ coma, chronic ([place])   . Loose, teeth   . Pancreatitis   . Poor dentition   . Tobacco abuse    SIGNIFICANT EVENTS:  Admit to ICU on 1/4  1/4 : Lost airway and PEA arrest on, requiring emergent tracheostomy and ROSC obtained 1/5: 4/4 Blood cultures growing H.Influenza , narrowed to Unasyn 1/5 > 1/14: Continued on antibiotics without any significant improvement  1/14: New right foot swelling for which MRI obtained due to concerns for seeding of cellulitis from neck 1/15: MRI with synovitis and subcutaneous edema suggestive of cellulitis; orthopedics consulted for right ankle aspiration - aspirate with positive crystals suggestive of gout - patient started on NSAIDs and steroids 1/16 Started on dilaudid, D/C fentanyl  1/17: Patient agitated  early AM, q2h prn versed prescribed 1/18: Hb 5.2, transfused 2u pRBC; started methadone  1/19: Klonopin increased to tid and metoprolol added for SVTs 1/20: precedex gtt discontinued overnight for concerns of bradycardia  1/21: clonidine taper, 24hr trach collar 1/22: continuing trach collar 1/23: last day of unasyn for cellulitis, steroid taper and colchicine for gout, continuing trach collar  1/24: 72 hours of trach collar 1/25 changed to 4 cuffless 1/26 sedation cut back STUDIES:    CT Soft Tissue Neck W/ Contrast (1/4)  Left greater than right lower facial and infrahyoid neck subcutaneous edema and inflammatory changes extending into the left anterior chest wall. There is no collection. Possible pharyngeal and supraglottic laryngeal wall edema, which could be accentuated by artifact. There is mild thickening of the epiglottis. Airway remains patent. No evidence of peritonsillar abscess.   CT Chest W Contrast (1/4) Patchy airspace consolidations within the lingula and dependent left lower lobe, with some volume loss within the right lung base. Findings are favored to represent multifocal pneumonia.  Induration within the soft tissues at the base of the neck extending into the superior aspect of the left chest wall with associated skin thickening, which may reflect cellulitis.   CT Head (1/4) Subtle loss of gray-white differentiation in the anterior right lentiform insular ribbon. This could represent an acute/subacute infarct. Moderate generalized atrophy and diffuse white matter disease. This likely reflects the sequela of chronic microvascular ischemia.  MRI Brain WO Contrast (1/5) No acute intracranial abnormality.  CT Soft Tissue Neck wo Contrast (  1/11) Persistent but slightly improved left greater than right lower facial and infrahyoid neck subcutaneous edema and presumed inflammatory changes extending into the anterior chest wall. No evidence of developing  collection  CT Chest wo Contrast (1/11) No subcutaneous or soft tissue emphysema in the chest wall to suggest necrotizing fasciitis in the chest. Stable mild skin thickening with associated subcutaneous fat stranding in the high ventral left chest wall. New small dependent bilateral pleural effusions. New mild interlobular septal thickening. New patchy ground-glass opacities in both lungs predominantly in the upper lobes. New patchy centrilobular micronodularity in the right upper lobe. Acute anterior upper rib fractures bilaterally as detailed. No pneumothorax.  MR RIGHT ANKLE (1/14) No evidence of osteomyelitis, or soft tissue abscess. Mildly enhancing small ankle joint effusion which could be due to synovitis.Focal interstitial tear with posterior tibialis tenosynovitis. Mild flexor digitorum and flexor hallucis longus tenosynovitis.Focal complete disruption of the anterior talofibular ligament with fluid in the syndesmosis.Diffuse subcutaneous edema and cellulitis.  CULTURES:  Influenza A : Negative Influenza B : Negtive Sars Coronavirus: Negative MRSA Nasopharyngeal negative 1/4 Blood cultures: 4/4 Positive for H. Influenza  Urine culture: No growth 1/6 Blood cultures: NGTD on 1/11 1/13 Tracheal aspirate: few klebsiella pneumoniae  1/16 R ankle aspirate : monosodium urate crystals, no organisms on gram stain; culture w/ no growth to date 1/27: Sputum culture>>> ANTIBIOTICS:  Bactrim 1/4 Azithromycin 1/4  Vancomycin 1/4 Zosyn  1/4 one dose Clindamycin 1/4 one dose Unasyn 1/5 >> 1/23   LINES/TUBES:  Tracheostomy - placed 1/4 Peripheral IV in Right Forearm - placed 1/9 Peripheral IV in Left Forearm - placed 1/9 PICC line double lumen - placed 1/15  CONSULTANTS:  Infectious Disease  ENT  Orthopedic surgery  SUBJECTIVE:  Patient was given 20 mg of Methadone by mistake and has a cuffless 4 in place  CONSTITUTIONAL: BP (!) 165/92 (BP Location: Left Arm)   Pulse (!)  131   Temp 98.2 F (36.8 C) (Oral)   Resp 18   Ht 5\' 8"  (1.727 m)   Wt 69.2 kg   SpO2 94%   BMI 23.20 kg/m   I/O last 3 completed shifts: In: 10 [I.V.:10] Out: 800 [Urine:800]       PHYSICAL EXAM: GEN: Unresponsive, not following command, acute on chronically ill appearing male HEENT: Size 4 cuffless tracheostomy in the cricoid membrane, [place]/AT, PERRL, EOM-I and MMM CV:  RRR, Nl S1/S2 and -M/R/G PULM: Coarse BS diffusely GI: Soft, NT, ND and +BS EXT: -edema and -tenderness NEURO: Withdraws all ext to pain not command PSYCH: RASS 0 SKIN: No rashes  I reviewed CXR myself, trach is in proper positioning  ASSESSMENT AND PLAN   Severe Soft Tissue Cellulitis of left neck resulting in Acute Airway compromise s/p emergent percutaneous tracheostomy->completed 21 days Unasyn H. 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  Diffuse bilateral airspace disease (treated aspiration) Klebsiella bronchitis Left basilar atelectasis Residual acute metabolic encephalopathy Encephalopathic with 20 of methadone and now hypercarbic  - Move to the ICU emergently - Emergently intubated then retrached with a new incision since the previous stoma was actually a cric - D/C all sedation - Precedex ordered in case needed once methadone wears off tonight - 7 days ceftriaxone for Klebsiella (  1/29-2/5), can switch to keflex if no longer needs IV - Restart TF - PCCM will assume care - Continue anti-HTN - BMET in AM - Replace electrolytes as indicated  The patient is critically ill with multiple organ systems failure and requires high complexity decision  making for assessment and support, frequent evaluation and titration of therapies, application of advanced monitoring technologies and extensive interpretation of multiple databases.   Critical Care Time devoted to patient care services described in this note is  40  Minutes. This time reflects time of care of this signee Dr [name]. This critical care time does not reflect procedure time, or teaching time or supervisory time of PA/NP/Med student/Med Resident etc but could involve care discussion time.  [name], M.D. [facility] Pulmonary/Critical Care Medicine

## 2019-07-08 NOTE — Progress Notes (Addendum)
PROGRESS NOTE    [name].  [id number] DOB: August 11, 1960 DOA: 06/12/2019 PCP: Alvester Chou, NP   Brief Narrative: 59 year old with past medical history significant for alcohol use disorder, tobacco, hypertension recent admission in 06/09/2018 for pancreatitis and hyponatremia who presents for evaluation of shortness of breath and neck swelling.  Reported to have it started 5 days ago and progressively gotten worse.  Patient tachycardic to 160 and tachypneic to the 40s in the ED.  Seen in the ED, patient says he has neck swelling, cough and difficulty breathing.  Admitted to ICU on 1/4 with loss of airway and PEA arrest requiring emergent tracheostomy.  Patient was  difficult to wean off of sedation.  He has been off of Precedex.  He was a started on Klonopin on 1/19.  Patient was transitioned to trach collar on 1/20.   CT of the neck soft tissue with contrast on 06/12/2019 showed left greater than right facial infrahyoid neck subcutaneous edema and inflammatory changes extending into the left anterior chest wall there is no fluid collection, but there is possible pharyngeal and supraglottic laryngeal wall edema.  Patient was restarted on IV antibiotics for Klebsiella Pneumonia, he will need 7 days of antibiotics. His sedatives were weaning off. He became more lethargic 07/08/2019. He received 20 mg dose of methadone. ABG ordered which showed PCO2 at 82, PH 7.3. CCM consulted, Dr Nelda Marseille came to bedside. Patient received narcan and decision was made to proceed with re-trach and vent support.   Assessment & Plan:   Principal Problem:   Neck infection Active Problems:   Alcohol abuse   Poor dentition   Septic shock (HCC)   Laryngeal edema   Multifocal pneumonia   Airway compromise   Chronic hepatitis C without hepatic coma (HCC)   Acute respiratory failure with hypoxemia (HCC)   Cardiac arrest, cause unspecified (HCC)   Haemophilis influenzae bacteremia    Malnutrition of moderate  degree   Atelectasis   Tracheostomy care (HCC)   Acute metabolic encephalopathy  1-Severe soft tissue cellulitis/acute respiratory failure with hypoxemia, acute airway compromise status post emergent percutaneous tracheostomy/Haemophilus influenza bacteremia/multifocal pneumonia: -4 out of 4 blood cultures grew Haemophilus influenza beta-lactamase negative, completed course of IV Unasyn of 21 days on 07/01/2019 -He has been able to tolerate trach collar, he was changed to a smaller size.  CCM following. -Follow sputum culture: Growing Klebsiella Pneumonia . WBC increase, secretions increase yesterday. -On ceftriaxone to cover for , klebsiella, needs 7 days tx  2-Acute confusional state/alcohol use disorder: Resolving Alert and oriented and following some commands. On Seroquel twice daily, methadone 3 times daily Klonopin 3 times daily Continue to wean benzo as able. MRI negative for acute stroke. Delirium precaution, out of bed.  Patient more lethargic today 1-30. He got a  20 mg dose of methadone this am. I have discontinue all sedative. Blood gas obtain showed respiratory acidosis with PH at 7.2 PCO2 at 80. CCM consulted, patient need likely Vent support and and trasnfer to ICU.   3-Intermittent SVT: Started on metoprolol 50 twice daily As needed doses now that he does not have any core track Replete magnesium  4-generalized edema/anasarca His net  negative.  Holding Lasix  5-right upper extremity edema: Resolving Right upper extremity ultrasound showed superficial vein thrombosis. Local care.  6-Urinary retention:   He will need voiding trial. Will try bethanecol.   7-Right ankle gout: Received steroid taper  8-Dysphagia, moderate caloric malnutrition: cortrack place again 1-29, resume tube  feeding  He failed a swallow evaluation.  9-Cardiac arrest, cause unspecified 10-Chronic hepatitis C without hepatic coma   Nutrition Problem: Moderate Malnutrition Etiology: chronic  illness(COPD, alcoholism)    Signs/Symptoms: mild fat depletion, moderate fat depletion, mild muscle depletion    Interventions: Tube feeding  Estimated body mass index is 23.2 kg/m as calculated from the following:   Height as of this encounter: 5\' 8"  (1.727 m).   Weight as of this encounter: 69.2 kg.   DVT prophylaxis: Heparin  Code Status: Full code Family Communication: I try to update significant other; I lost connection. I updated significant other at bedside.  Disposition Plan: Patient from home.  He will require skilled nursing facility. Transfer to ICU for vent support.  Consultants:   CCM    Procedures/ Events:  Admit to ICU on 1/4  1/4 : Lost airway and PEA arrest on, requiring emergent tracheostomy and ROSC obtained 1/5: 4/4 Blood cultures growing H.Influenza , narrowed to Unasyn 1/5 > 1/14: Continued on antibiotics without any significant improvement  1/14: New right foot swelling for which MRI obtained due to concerns for seeding of cellulitis from neck 1/15: MRI with synovitis and subcutaneous edema suggestive of cellulitis; orthopedics consulted for right ankle aspiration - aspirate with positive crystals suggestive of gout - patient started on NSAIDs and steroids 1/16 Started on dilaudid, D/C fentanyl  1/17: Patient agitated early AM, q2h prn versed prescribed 1/18: Hb 5.2, transfused 2u pRBC; started methadone  1/19: Klonopin increased to tid and metoprolol added for SVTs 1/20: precedex gtt discontinued overnight for concerns of bradycardia  1/21: clonidine taper, 24hr trach collar 1/22: continuing trach collar 1/23: last day of unasyn for cellulitis, steroid taper and colchicine for gout, continuing trach collar  1/24: 72 hours of trach collar 1/25 changed to 4 cuffless 1/26 sedation cut back  Cultures;  Influenza A : Negative Influenza B : Negtive Sars Coronavirus: Negative MRSA Nasopharyngeal negative 1/4 Blood cultures: 4/4 Positive for H.  Influenza  Urine culture: No growth 1/6 Blood cultures: NGTD on 1/11 1/13 Tracheal aspirate: few klebsiella pneumoniae  1/16 R ankle aspirate : monosodium urate crystals, no organisms on gram stain; culture w/ no growth to date 1/27: Sputum culture>>>  Antibiotics;  Bactrim 1/4 Azithromycin 1/4  Vancomycin 1/4 Zosyn  1/4 one dose Clindamycin 1/4 one dose Unasyn 1/5 >> 1/23   Subjective: Lethargic, not following command.   Objective: Vitals:   07/08/19 0359 07/08/19 0400 07/08/19 0500 07/08/19 0804  BP: 135/72   (!) 165/92  Pulse: (!) 105   (!) 131  Resp: 13 15  18   Temp:    98.2 F (36.8 C)  TempSrc:    Oral  SpO2: 97%   94%  Weight:   69.2 kg   Height:        Intake/Output Summary (Last 24 hours) at 07/08/2019 0954 Last data filed at 07/07/2019 2051 Gross per 24 hour  Intake 10 ml  Output 250 ml  Net -240 ml   Filed Weights   07/06/19 0350 07/07/19 0403 07/08/19 0500  Weight: 69.8 kg 69.6 kg 69.2 kg    Examination:  General exam: Lethargic Respiratory system: Bilateral ronchus, trach was cap Cardiovascular system: S 1, S 2 RRR Gastrointestinal system: BS present, soft, nt Central nervous system: Lethargic.  Extremities: no edema Skin: no rashes  Data Reviewed: I have personally reviewed following labs and imaging studies  CBC: Recent Labs  Lab 07/02/19 0403 07/03/19 0433 07/06/19 0850 07/07/19 1216  WBC  7.5 8.4 8.8 13.7*  HGB 11.3* 11.8* 12.4* 12.4*  HCT 38.9* 40.4 42.0 41.2  MCV 85.5 85.6 84.0 83.7  PLT 521* 504* 444* [id number]   Basic Metabolic Panel: Recent Labs  Lab 07/03/19 0433 07/04/19 0545 07/05/19 0424 07/06/19 0850 07/07/19 1216  NA 136 138 136 137 141  K 4.3 4.4 4.5 4.0 4.4  CL 94* 96* 94* 95* 100  CO2 31 32 32 29 29  GLUCOSE 106* 116* 115* 121* 119*  BUN 19 17 18 16 17   CREATININE 0.94 0.62 0.67 0.80 0.95  CALCIUM 9.3 9.5 9.5 10.2 10.5*  MG  --   --   --  1.8  --    GFR: Estimated Creatinine Clearance: 83 mL/min (by C-G  formula based on SCr of 0.95 mg/dL). Liver Function Tests: No results for input(s): AST, ALT, ALKPHOS, BILITOT, PROT, ALBUMIN in the last 168 hours. No results for input(s): LIPASE, AMYLASE in the last 168 hours. No results for input(s): AMMONIA in the last 168 hours. Coagulation Profile: No results for input(s): INR, PROTIME in the last 168 hours. Cardiac Enzymes: No results for input(s): CKTOTAL, CKMB, CKMBINDEX, TROPONINI in the last 168 hours. BNP (last 3 results) No results for input(s): PROBNP in the last 8760 hours. HbA1C: No results for input(s): HGBA1C in the last 72 hours. CBG: Recent Labs  Lab 07/07/19 1620 07/07/19 2009 07/07/19 2340 07/08/19 0342 07/08/19 0802  GLUCAP 108* 95 106* 113* 116*   Lipid Profile: No results for input(s): CHOL, HDL, LDLCALC, TRIG, CHOLHDL, LDLDIRECT in the last 72 hours. Thyroid Function Tests: No results for input(s): TSH, T4TOTAL, FREET4, T3FREE, THYROIDAB in the last 72 hours. Anemia Panel: No results for input(s): VITAMINB12, FOLATE, FERRITIN, TIBC, IRON, RETICCTPCT in the last 72 hours. Sepsis Labs: No results for input(s): PROCALCITON, LATICACIDVEN in the last 168 hours.  Recent Results (from the past 240 hour(s))  Culture, respiratory (non-expectorated)     Status: None (Preliminary result)   Collection Time: 07/05/19 11:53 AM   Specimen: Tracheal Aspirate; Respiratory  Result Value Ref Range Status   Specimen Description TRACHEAL ASPIRATE  Final   Special Requests NONE  Final   Gram Stain   Final    MODERATE WBC PRESENT,BOTH PMN AND MONONUCLEAR RARE GRAM POSITIVE COCCI IN PAIRS    Culture   Final    RARE KLEBSIELLA PNEUMONIAE CULTURE REINCUBATED FOR BETTER GROWTH Performed at [facility], [place] [address] [place], [place] [address]    Report Status PENDING  Incomplete   Organism ID, Bacteria KLEBSIELLA PNEUMONIAE  Final      Susceptibility   Klebsiella pneumoniae - MIC*    AMPICILLIN >=32 RESISTANT Resistant      CEFAZOLIN <=4 SENSITIVE Sensitive     CEFEPIME <=0.12 SENSITIVE Sensitive     CEFTAZIDIME <=1 SENSITIVE Sensitive     CEFTRIAXONE <=0.25 SENSITIVE Sensitive     CIPROFLOXACIN <=0.25 SENSITIVE Sensitive     GENTAMICIN <=1 SENSITIVE Sensitive     IMIPENEM <=0.25 SENSITIVE Sensitive     TRIMETH/SULFA <=20 SENSITIVE Sensitive     AMPICILLIN/SULBACTAM 8 SENSITIVE Sensitive     PIP/TAZO 16 SENSITIVE Sensitive     * RARE KLEBSIELLA PNEUMONIAE         Radiology Studies: DG Abd 1 View  Result Date: 07/07/2019 CLINICAL DATA:  Nausea, vomiting EXAM: ABDOMEN - 1 VIEW COMPARISON:  07/06/2019 FINDINGS: Nonobstructive pattern of bowel gas with gas present to the rectum. There has been interval transit of enteric contrast material,  which is now present in the ascending, transverse, and proximal descending colon. No free air in the abdomen. Esophagogastric tube may have been retracted in the interval, although the position of the side port relative to the diaphragm is not well imaged. IMPRESSION: 1. Nonobstructive pattern of bowel gas with gas present to the rectum. There has been interval transit of enteric contrast material, which is now present in the ascending, transverse, and proximal descending colon. 2. Esophagogastric tube may have been retracted in the interval, although the position of the side port relative to the diaphragm is not well imaged. Correlate for return and consider advancement. Electronically Signed   By: Eddie Candle M.D.   On: 07/07/2019 09:01   DG CHEST PORT 1 VIEW  Result Date: 07/07/2019 CLINICAL DATA:  Cough. EXAM: PORTABLE CHEST 1 VIEW COMPARISON:  Chest x-ray dated July 03, 2019. FINDINGS: Unchanged tracheostomy tube. Unchanged right upper extremity PICC line. Interval exchange of the feeding tube for enteric tube, with the tip just inside the stomach and the proximal side port in the lower esophagus. Stable cardiomediastinal silhouette. Bilateral predominantly interstitial  opacities have improved. Unchanged small left pleural effusion with adjacent left basilar opacity, likely atelectasis. Resolved right pleural effusion. No pneumothorax. No acute osseous abnormality. IMPRESSION: 1. New enteric tube with proximal side port in the lower esophagus. Recommend advancing at least 8 cm. 2. Improved pulmonary edema and right pleural effusion. Unchanged small left pleural effusion. Electronically Signed   By: Titus Dubin M.D.   On: 07/07/2019 09:01   DG Abd Portable 1V  Result Date: 07/06/2019 CLINICAL DATA:  Nasogastric tube placement EXAM: PORTABLE ABDOMEN - 1 VIEW COMPARISON:  Portable exam 1545 hours compared to 06/22/2019 FINDINGS: Tip of nasogastric tube projects over proximal stomach. Atelectasis versus consolidation LEFT lower lobe. Small amount retained contrast in small bowel loops in the RIGHT mid abdomen. Bowel gas pattern otherwise unremarkable. Degenerative changes spine. IMPRESSION: Tip of nasogastric tube projects over proximal stomach. Electronically Signed   By: Lavonia Dana M.D.   On: 07/06/2019 16:04   DG Swallowing Func-Speech Pathology  Result Date: 07/06/2019 Objective Swallowing Evaluation: Type of Study: MBS-Modified Barium Swallow Study  Patient Details Name: [name]. MRN: [id number] Date of Birth: [birth year] Today's Date: 07/06/2019 Time: SLP Start Time (ACUTE ONLY): 0940 -SLP Stop Time (ACUTE ONLY): 0957 SLP Time Calculation (min) (ACUTE ONLY): 17 min Past Medical History: Past Medical History: Diagnosis Date . Alcohol abuse  . Alcoholic ([place])  . Back pain  . CAP (community acquired pneumonia) 11/26/2018 . COPD (chronic obstructive pulmonary disease) ([place])  . Empyema of right pleural space ([place]) 11/26/2018 . Heavy smoker  . Hep C w/ coma, chronic ([place])  . Loose, teeth  . Pancreatitis  . Poor dentition  . Tobacco abuse  Past Surgical History: Past Surgical History: Procedure Laterality Date . ARTERY REPAIR  2013  neck . BIOPSY  02/19/2019   Procedure: BIOPSY;  Surgeon: Ronnette Juniper, MD;  Location: [facility] ENDOSCOPY;  Service: Gastroenterology;; . ESOPHAGOGASTRODUODENOSCOPY (EGD) WITH PROPOFOL N/A 02/19/2019  Procedure: ESOPHAGOGASTRODUODENOSCOPY (EGD) WITH PROPOFOL;  Surgeon: Ronnette Juniper, MD;  Location: [place];  Service: Gastroenterology;  Laterality: N/A; . FOREIGN BODY REMOVAL  02/19/2019  Procedure: FOREIGN BODY REMOVAL;  Surgeon: Ronnette Juniper, MD;  Location: [facility] ENDOSCOPY;  Service: Gastroenterology;; . KNEE SURGERY  2013  rt . MICROLARYNGOSCOPY N/A 07/31/2014  Procedure: MICROLARYNGOSCOPY WITH BIOPSY ;  Surgeon: Rozetta Nunnery, MD;  Location: [place];  Service: ENT;  Laterality:  N/A; . NECK SURGERY  2013  cervical fusion- . VASCULAR SURGERY   . VIDEO ASSISTED THORACOSCOPY (VATS)/EMPYEMA Right 11/26/2018  Procedure: VIDEO ASSISTED THORACOSCOPY (VATS)/EMPYEMA;  Surgeon: Rexene Alberts, MD;  Location: [place];  Service: Thoracic;  Laterality: Right; [name] VIDEO BRONCHOSCOPY  11/26/2018  Procedure: Video Bronchoscopy;  Surgeon: Rexene Alberts, MD;  Location: [place];  Service: Thoracic;; HPI: 59 year old man with a history of alcohol use disorder, tobacco use disorder, HTN, and recent admission on 04/10/2019 for pancreatitis and hyponatremia who presents for evaluation of shortness of breath and neck swelling with loss of airway and PEA arrest requiring emergent tracheostomy.  Subjective: Pt was alert and cooperative Assessment / Plan / Recommendation CHL IP CLINICAL IMPRESSIONS 07/06/2019 Clinical Impression Pt presents with severe oropharyngeal dysphagia c/b lingual discoordination, premature spillage, reduced base of tonge retraction, decreased pharyngeal peristalsis, incomplete laryngeal closure, decreased hyolaryngeal excusion, and diminished sensation.  These deficits results in silent aspiration of thin and nectar thick liquids during and after the swallow, penetration of honey thick liquid, and significant vallecular stasis of puree  consistency.  Aspiration of thin and nectar thick liquids was not always sensed, and pt was unable to follow direction for cued cough.  Reflexive cough was not beneficial to clear aspiration.  There was a moderate amount of aspiration at both anterior and posterior trachea.  Honey thick liquid was penetrated without full clearance, and appeared to progress to the level of the vocal folds; however, there was some residue in laryngeal vestibule from prior trials as well, which may have mixed with honey thick liquid making it thinner.  With puree, there was no prandial penetration or aspiration, but there was severe vallecular residue which was partially cleared with honey thick liquid wash.  Pt is at risk for aspiration of residueal puree, especially as it mixes with thin secretions. All bolus trials were given with PMV in place.  Pt is a poor candidate for swallowing therapy at this time 2/2 inability to follow directions.  Consider repeat evaluation early next week or as overall status improves. Recommend pt remain NPO with alternate means of nutrition, hydration, and medication.  Pt currently does not have AMN as he has pulled cortrak. If pt is kept PO overnight for cortrak team, and there are priority oral meds that must be given, please crush with puree. SLP Visit Diagnosis Dysphagia, oropharyngeal phase (R13.12) Attention and concentration deficit following -- Frontal lobe and executive function deficit following -- Impact on safety and function --   CHL IP TREATMENT RECOMMENDATION 07/06/2019 Treatment Recommendations Therapy as outlined in treatment plan below   Prognosis 07/06/2019 Prognosis for Safe Diet Advancement Good Barriers to Reach Goals -- Barriers/Prognosis Comment -- CHL IP DIET RECOMMENDATION 07/06/2019 SLP Diet Recommendations NPO;Alternative means - temporary Liquid Administration via -- Medication Administration Via alternative means Compensations -- Postural Changes --   CHL IP OTHER  RECOMMENDATIONS 07/06/2019 Recommended Consults -- Oral Care Recommendations Oral care QID Other Recommendations --   CHL IP FOLLOW UP RECOMMENDATIONS 07/06/2019 Follow up Recommendations Skilled Nursing facility   [facility] IP FREQUENCY AND DURATION 07/06/2019 Speech Therapy Frequency (ACUTE ONLY) min 2x/week Treatment Duration 2 weeks      CHL IP ORAL PHASE 07/06/2019 Oral Phase Impaired Oral - Pudding Teaspoon -- Oral - Pudding Cup -- Oral - Honey Teaspoon -- Oral - Honey Cup Decreased bolus cohesion;Premature spillage Oral - Nectar Teaspoon -- Oral - Nectar Cup Decreased bolus cohesion;Premature spillage Oral - Nectar Straw -- Oral - Thin  Teaspoon -- Oral - Thin Cup Decreased bolus cohesion;Premature spillage Oral - Thin Straw -- Oral - Puree Premature spillage Oral - Mech Soft -- Oral - Regular -- Oral - Multi-Consistency -- Oral - Pill -- Oral Phase - Comment --  CHL IP PHARYNGEAL PHASE 07/06/2019 Pharyngeal Phase Impaired Pharyngeal- Pudding Teaspoon -- Pharyngeal -- Pharyngeal- Pudding Cup -- Pharyngeal -- Pharyngeal- Honey Teaspoon -- Pharyngeal -- Pharyngeal- Honey Cup Delayed swallow initiation-pyriform sinuses;Reduced tongue base retraction;Reduced airway/laryngeal closure;Reduced laryngeal elevation;Reduced anterior laryngeal mobility;Penetration/Aspiration during swallow;Penetration/Apiration after swallow;Pharyngeal residue - valleculae;Pharyngeal residue - pyriform Pharyngeal Material enters airway, remains ABOVE vocal cords and not ejected out Pharyngeal- Nectar Teaspoon -- Pharyngeal -- Pharyngeal- Nectar Cup Delayed swallow initiation-pyriform sinuses;Reduced anterior laryngeal mobility;Reduced laryngeal elevation;Reduced airway/laryngeal closure;Reduced tongue base retraction;Penetration/Aspiration during swallow;Penetration/Apiration after swallow;Moderate aspiration;Pharyngeal residue - valleculae;Reduced pharyngeal peristalsis Pharyngeal Material enters airway, passes BELOW cords without attempt by  patient to eject out (silent aspiration) Pharyngeal- Nectar Straw -- Pharyngeal -- Pharyngeal- Thin Teaspoon -- Pharyngeal -- Pharyngeal- Thin Cup Reduced anterior laryngeal mobility;Reduced laryngeal elevation;Reduced airway/laryngeal closure;Reduced tongue base retraction;Penetration/Aspiration during swallow;Penetration/Apiration after swallow;Moderate aspiration;Pharyngeal residue - valleculae;Delayed swallow initiation-vallecula Pharyngeal Material enters airway, passes BELOW cords without attempt by patient to eject out (silent aspiration) Pharyngeal- Thin Straw -- Pharyngeal -- Pharyngeal- Puree Delayed swallow initiation-vallecula;Reduced tongue base retraction;Pharyngeal residue - pyriform Pharyngeal Material does not enter airway Pharyngeal- Mechanical Soft -- Pharyngeal -- Pharyngeal- Regular -- Pharyngeal -- Pharyngeal- Multi-consistency -- Pharyngeal -- Pharyngeal- Pill -- Pharyngeal -- Pharyngeal Comment --  CHL IP CERVICAL ESOPHAGEAL PHASE 07/06/2019 Cervical Esophageal Phase WFL Pudding Teaspoon -- Pudding Cup -- Honey Teaspoon -- Honey Cup -- Nectar Teaspoon -- Nectar Cup -- Nectar Straw -- Thin Teaspoon -- Thin Cup -- Thin Straw -- Puree -- Mechanical Soft -- Regular -- Multi-consistency -- Pill -- Cervical Esophageal Comment -- Leigh E Borum 07/06/2019, 10:51 AM                   Scheduled Meds: . bethanechol  5 mg Oral TID  . chlorhexidine  15 mL Mouth Rinse BID  . Chlorhexidine Gluconate Cloth  6 each Topical Daily  . famotidine  20 mg Per Tube BID  . feeding supplement (OSMOLITE 1.2 CAL)  1,000 mL Per Tube Q24H  . folic acid  1 mg Intravenous Daily  . free water  200 mL Per Tube Q8H  . gabapentin  600 mg Per Tube Q12H  . guaiFENesin  15 mL Per Tube Q6H  . heparin injection (subcutaneous)  5,000 Units Subcutaneous Q8H  . insulin aspart  0-15 Units Subcutaneous Q4H  . mouth rinse  15 mL Mouth Rinse q12n4p  . [START ON 07/09/2019] methadone  10 mg Per Tube Daily  . metoprolol  tartrate  75 mg Per Tube BID  . nicotine  21 mg Transdermal Daily  . polyethylene glycol  17 g Per Tube Daily  . QUEtiapine  200 mg Per Tube BID  . sodium chloride flush  10-40 mL Intracatheter Q12H  . thiamine  100 mg Per Tube Daily   Continuous Infusions: . sodium chloride Stopped (07/02/19 1610)  . cefTRIAXone (ROCEPHIN)  IV 1 g (07/07/19 1640)  . feeding supplement (VITAL AF 1.2 CAL) 65 mL/hr at 07/05/19 0600     LOS: 26 days    Time spent: 35 minutes.     Elmarie Shiley, MD [facility]  If 7PM-7AM, please contact night-coverage  07/08/2019, 9:54 AM

## 2019-07-08 NOTE — Progress Notes (Signed)
Patient successfully intubated by dr. Nelda Marseille at the bedside

## 2019-07-08 NOTE — Progress Notes (Signed)
Patient arrived from floor being bagged through trach.  Upon arrival to room, MD intubated patient for airway stabilization to be able to perform tracheostomy.  MD placed #6 Sh[place] without complications.  Patient placed on ventilator and is currently tolerating well.  Will continue to monitor.

## 2019-07-08 NOTE — Significant Event (Signed)
Rapid Response Event Note  Overview: Respiratory - Abnormal ABG and Neurologic - Decreased LOC  Nurse called me with concerns of patient's ABG - 7.27/84.4/113/32.4 - he was on trach collar but received Methadone 20 mg this morning - since than has been more lethargic. TRH MD had already consulted the PCCM team. Patient had 4 cuffed trach in placed, trach was uncapped and PCCM MD came the bedside, patient was bagged and given Narcan 0.4 mg IV and urgently transferred to the 2M08 for redo-tracheostomy. Oxygen saturations remained 100%. Upon arrival to the 2M08, patient underwent RSI, was successfully intubated, underwent percutaneous tracheostomy as wel.   Start Time 1131 End Time 1215  Calie Buttrey R

## 2019-07-08 NOTE — Progress Notes (Signed)
Patient manually ventilated while transporting patient to ICU-8.  Transport was uneventful.  VS remain stable.

## 2019-07-08 NOTE — Procedures (Signed)
Intubation Procedure Note [name] [id number] 04/13/1961  Procedure: Intubation Indications: Respiratory insufficiency  Procedure Details Consent: Unable to obtain consent because of emergent medical necessity. Time Out: Verified patient identification, verified procedure, site/side was marked, verified correct patient position, special equipment/implants available, medications/allergies/relevent history reviewed, required imaging and test results available.  Performed  Maximum sterile technique was used including cap, gloves, gown, hand hygiene and mask.  MAC    Evaluation Hemodynamic Status: BP stable throughout; O2 sats: stable throughout Patient's Current Condition: stable Complications: No apparent complications Patient did tolerate procedure well. Chest X-ray ordered to verify placement.  CXR: pending.   [name] 07/08/2019

## 2019-07-08 NOTE — Progress Notes (Signed)
Dr. Tyrell Antonio updated with critical Co2 at 84.4. Dr. Tyrell Antonio in to see pt and referred to critical care team. Transferred to 2M08 for further care. Called pt daughter and left message of pt transfer.

## 2019-07-09 DIAGNOSIS — Z930 Tracheostomy status: Secondary | ICD-10-CM

## 2019-07-09 LAB — MAGNESIUM: Magnesium: 1.8 mg/dL (ref 1.7–2.4)

## 2019-07-09 LAB — GLUCOSE, CAPILLARY
Glucose-Capillary: 113 mg/dL — ABNORMAL HIGH (ref 70–99)
Glucose-Capillary: 118 mg/dL — ABNORMAL HIGH (ref 70–99)
Glucose-Capillary: 123 mg/dL — ABNORMAL HIGH (ref 70–99)
Glucose-Capillary: 132 mg/dL — ABNORMAL HIGH (ref 70–99)
Glucose-Capillary: 143 mg/dL — ABNORMAL HIGH (ref 70–99)
Glucose-Capillary: 146 mg/dL — ABNORMAL HIGH (ref 70–99)

## 2019-07-09 LAB — CBC
HCT: 37.5 % — ABNORMAL LOW (ref 39.0–52.0)
Hemoglobin: 11.2 g/dL — ABNORMAL LOW (ref 13.0–17.0)
MCH: 25.1 pg — ABNORMAL LOW (ref 26.0–34.0)
MCHC: 29.9 g/dL — ABNORMAL LOW (ref 30.0–36.0)
MCV: 84.1 fL (ref 80.0–100.0)
Platelets: 292 10*3/uL (ref 150–400)
RBC: 4.46 MIL/uL (ref 4.22–5.81)
RDW: 18.8 % — ABNORMAL HIGH (ref 11.5–15.5)
WBC: 10.4 10*3/uL (ref 4.0–10.5)
nRBC: 0 % (ref 0.0–0.2)

## 2019-07-09 LAB — BASIC METABOLIC PANEL
Anion gap: 15 (ref 5–15)
BUN: 27 mg/dL — ABNORMAL HIGH (ref 6–20)
CO2: 26 mmol/L (ref 22–32)
Calcium: 10 mg/dL (ref 8.9–10.3)
Chloride: 103 mmol/L (ref 98–111)
Creatinine, Ser: 0.97 mg/dL (ref 0.61–1.24)
GFR calc Af Amer: >60 mL/min (ref >60)
GFR calc non Af Amer: >60 mL/min (ref >60)
Glucose, Bld: 133 mg/dL — ABNORMAL HIGH (ref 70–99)
Potassium: 3.4 mmol/L — ABNORMAL LOW (ref 3.5–5.1)
Sodium: 144 mmol/L (ref 135–145)

## 2019-07-09 LAB — PHOSPHORUS: Phosphorus: 3.4 mg/dL (ref 2.5–4.6)

## 2019-07-09 MED ORDER — HALOPERIDOL LACTATE 5 MG/ML IJ SOLN
3.0000 mg | Freq: Once | INTRAMUSCULAR | Status: AC
  Administered 2019-07-09: 3 mg via INTRAVENOUS
  Filled 2019-07-09: qty 1

## 2019-07-09 MED ORDER — QUETIAPINE FUMARATE 50 MG PO TABS
25.0000 mg | ORAL_TABLET | Freq: Two times a day (BID) | ORAL | Status: DC
  Administered 2019-07-09 (×2): 25 mg via ORAL
  Filled 2019-07-09 (×2): qty 1

## 2019-07-09 MED ORDER — POTASSIUM CHLORIDE 20 MEQ/15ML (10%) PO SOLN
40.0000 meq | Freq: Three times a day (TID) | ORAL | Status: AC
  Administered 2019-07-09 (×2): 40 meq
  Filled 2019-07-09 (×2): qty 30

## 2019-07-09 MED ORDER — MAGNESIUM SULFATE 2 GM/50ML IV SOLN
2.0000 g | Freq: Once | INTRAVENOUS | Status: AC
  Administered 2019-07-09: 2 g via INTRAVENOUS
  Filled 2019-07-09: qty 50

## 2019-07-09 NOTE — Progress Notes (Signed)
[facility] Progress Note Patient Name: [name]. DOB: [birth year] MRN: [id number]   Date of Service  07/09/2019  HPI/Events of Note  RN requested flexiseal for frequent loose stool (not new)  eICU Interventions  Ordered     Intervention Category Intermediate Interventions: Other:  Margaretmary Lombard 07/09/2019, 7:59 PM

## 2019-07-09 NOTE — Progress Notes (Signed)
RT note: patient placed on 35% ATC.  Currently tolerating well.  Will continue to monitor.

## 2019-07-09 NOTE — Progress Notes (Signed)
PULMONARY / CRITICAL CARE MEDICINE  NAME:  [name]., MRN:  [id number], DOB:  1960/09/23, LOS: 10 ADMISSION DATE:  06/12/2019, CONSULTATION DATE:  06/12/2019 REFERRING MD:  Alinda Sierras, CHIEF COMPLAINT:  Shortness of breath, sore throat, and neck swelling  BRIEF HISTORY:    59 year old man with a history of alcohol use disorder, tobacco used disorder, HTN, and recent admission on 04/10/2019 for pancreatitis and hyponatremia who presents for evaluation of shortness of breath and neck swelling. Reported  to have started 5 days ago and progressively gotten worse. Patient tachycardic to 160's and tachypnic to the 40's in the ED. Seen in ED, patient says he has neck swelling , cough, and difficulty breathing. Admitted to ICU on 1/4 with loss of airway and PEA arrest requiring emergent tracheostomy. Patient has been difficult to wean off sedation   SIGNIFICANT PAST MEDICAL HISTORY    Past Medical History:  Diagnosis Date  . Alcohol abuse   . Alcoholic ([place])   . Back pain   . CAP (community acquired pneumonia) 11/26/2018  . COPD (chronic obstructive pulmonary disease) ([place])   . Empyema of right pleural space ([place]) 11/26/2018  . Heavy smoker   . Hep C w/ coma, chronic ([place])   . Loose, teeth   . Pancreatitis   . Poor dentition   . Tobacco abuse    SIGNIFICANT EVENTS:  Admit to ICU on 1/4  1/4 : Lost airway and PEA arrest on, requiring emergent tracheostomy and ROSC obtained 1/5: 4/4 Blood cultures growing H.Influenza , narrowed to Unasyn 1/5 > 1/14: Continued on antibiotics without any significant improvement  1/14: New right foot swelling for which MRI obtained due to concerns for seeding of cellulitis from neck 1/15: MRI with synovitis and subcutaneous edema suggestive of cellulitis; orthopedics consulted for right ankle aspiration - aspirate with positive crystals suggestive of gout - patient started on NSAIDs and steroids 1/16 Started on dilaudid, D/C fentanyl  1/17: Patient agitated  early AM, q2h prn versed prescribed 1/18: Hb 5.2, transfused 2u pRBC; started methadone  1/19: Klonopin increased to tid and metoprolol added for SVTs 1/20: precedex gtt discontinued overnight for concerns of bradycardia  1/21: clonidine taper, 24hr trach collar 1/22: continuing trach collar 1/23: last day of unasyn for cellulitis, steroid taper and colchicine for gout, continuing trach collar  1/24: 72 hours of trach collar 1/25 changed to 4 cuffless 1/26 sedation cut back STUDIES:    CT Soft Tissue Neck W/ Contrast (1/4)  Left greater than right lower facial and infrahyoid neck subcutaneous edema and inflammatory changes extending into the left anterior chest wall. There is no collection. Possible pharyngeal and supraglottic laryngeal wall edema, which could be accentuated by artifact. There is mild thickening of the epiglottis. Airway remains patent. No evidence of peritonsillar abscess.   CT Chest W Contrast (1/4) Patchy airspace consolidations within the lingula and dependent left lower lobe, with some volume loss within the right lung base. Findings are favored to represent multifocal pneumonia.  Induration within the soft tissues at the base of the neck extending into the superior aspect of the left chest wall with associated skin thickening, which may reflect cellulitis.   CT Head (1/4) Subtle loss of gray-white differentiation in the anterior right lentiform insular ribbon. This could represent an acute/subacute infarct. Moderate generalized atrophy and diffuse white matter disease. This likely reflects the sequela of chronic microvascular ischemia.  MRI Brain WO Contrast (1/5) No acute intracranial abnormality.  CT Soft Tissue Neck wo Contrast (  1/11) Persistent but slightly improved left greater than right lower facial and infrahyoid neck subcutaneous edema and presumed inflammatory changes extending into the anterior chest wall. No evidence of developing  collection  CT Chest wo Contrast (1/11) No subcutaneous or soft tissue emphysema in the chest wall to suggest necrotizing fasciitis in the chest. Stable mild skin thickening with associated subcutaneous fat stranding in the high ventral left chest wall. New small dependent bilateral pleural effusions. New mild interlobular septal thickening. New patchy ground-glass opacities in both lungs predominantly in the upper lobes. New patchy centrilobular micronodularity in the right upper lobe. Acute anterior upper rib fractures bilaterally as detailed. No pneumothorax.  MR RIGHT ANKLE (1/14) No evidence of osteomyelitis, or soft tissue abscess. Mildly enhancing small ankle joint effusion which could be due to synovitis.Focal interstitial tear with posterior tibialis tenosynovitis. Mild flexor digitorum and flexor hallucis longus tenosynovitis.Focal complete disruption of the anterior talofibular ligament with fluid in the syndesmosis.Diffuse subcutaneous edema and cellulitis.  CULTURES:  Influenza A : Negative Influenza B : Negtive Sars Coronavirus: Negative MRSA Nasopharyngeal negative 1/4 Blood cultures: 4/4 Positive for H. Influenza  Urine culture: No growth 1/6 Blood cultures: NGTD on 1/11 1/13 Tracheal aspirate: few klebsiella pneumoniae  1/16 R ankle aspirate : monosodium urate crystals, no organisms on gram stain; culture w/ no growth to date 1/27: Sputum culture>>> ANTIBIOTICS:  Bactrim 1/4 Azithromycin 1/4  Vancomycin 1/4 Zosyn  1/4 one dose Clindamycin 1/4 one dose Unasyn 1/5 >> 1/23   LINES/TUBES:  Tracheostomy - placed 1/4 Peripheral IV in Right Forearm - placed 1/9 Peripheral IV in Left Forearm - placed 1/9 PICC line double lumen - placed 1/15  CONSULTANTS:  Infectious Disease  ENT  Orthopedic surgery  SUBJECTIVE:  No events overnight, tolerated vent well  CONSTITUTIONAL: BP (!) 148/72   Pulse 67   Temp 100.3 F (37.9 C) (Axillary)   Resp 20   Ht 5\' 8"   (1.727 m)   Wt 70.2 kg   SpO2 99%   BMI 23.53 kg/m   I/O last 3 completed shifts: In: 2063.5 [I.V.:357.6; NG/GT:1505; IV Piggyback:200.9] Out: 1075 [Urine:1075]   Vent Mode: PRVC FiO2 (%):  [40 %-100 %] 40 % Set Rate:  [20 bmp] 20 bmp Vt Set:  [540 mL] 540 mL PEEP:  [5 cmH20] 5 cmH20 Plateau Pressure:  [18 cmH20-20 cmH20] 20 cmH20   PHYSICAL EXAM: GEN: Acute on chronically ill appearing male, NAD HEENT: Size 6 cuffed trach in proper position (no longer a cric), [place]/AT, PERRL, EOM-I and MMM CV: RRR, Nl S1/S2 and -M/R/G PULM: Coarse BS diffusely GI: Soft, NT, ND and +BS EXT: -edema and -tenderness NEURO: Arousable, slow to respond but moves all ext to command PSYCH: RASS 0 SKIN: No rashes  I reviewed CXR myself, new trach is in a good position  ASSESSMENT AND PLAN   Severe Soft Tissue Cellulitis of left neck resulting in Acute Airway compromise s/p emergent percutaneous tracheostomy->completed 21 days Unasyn H. 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  Diffuse bilateral airspace disease (treated aspiration) Klebsiella bronchitis Left basilar atelectasis Residual acute metabolic encephalopathy Encephalopathic with 20 of methadone and now hypercarbic  - PS trials and advance to TC quickly if able - Keep cuffed trach in  for now until insure vent is not needed - D/C all sedation at this point but monitor closely for narcotic withdraw symptoms, no need for methadone for now - D/C precedex - 7 days ceftriaxone for Klebsiella (1/29-2/5) - Restart TF via NGT for now - Continue free water - Hold diureses - Continue anti-HTN -  BMET in AM - Replace K and Mg - Add seroquel 25 BID, QTc 0.42  The patient is critically ill with multiple organ systems failure and requires high complexity decision making for assessment and support, frequent evaluation and titration of therapies, application of advanced monitoring technologies and extensive interpretation of multiple databases.   Critical Care Time devoted to patient care services described in this note is  32  Minutes. This time reflects time of care of this signee Dr [name]. This critical care time does not reflect procedure time, or teaching time or supervisory time of PA/NP/Med student/Med Resident etc but could involve care discussion time.  [name], M.D. [facility] Pulmonary/Critical Care Medicine.

## 2019-07-09 NOTE — Progress Notes (Signed)
Will hold 1600 CPT due to currently trying to slide out of bed.  Will continue to monitor.

## 2019-07-09 NOTE — Progress Notes (Signed)
RT note: attempted SBT patient on this AM however patient became agitated and RN had to give patient medication.  Placed patient back on full support settings due to a decrease in RR after sedation.

## 2019-07-10 ENCOUNTER — Inpatient Hospital Stay (HOSPITAL_COMMUNITY): Payer: Medicaid Other

## 2019-07-10 DIAGNOSIS — J150 Pneumonia due to Klebsiella pneumoniae: Secondary | ICD-10-CM

## 2019-07-10 LAB — GLUCOSE, CAPILLARY
Glucose-Capillary: 103 mg/dL — ABNORMAL HIGH (ref 70–99)
Glucose-Capillary: 105 mg/dL — ABNORMAL HIGH (ref 70–99)
Glucose-Capillary: 109 mg/dL — ABNORMAL HIGH (ref 70–99)
Glucose-Capillary: 110 mg/dL — ABNORMAL HIGH (ref 70–99)
Glucose-Capillary: 113 mg/dL — ABNORMAL HIGH (ref 70–99)
Glucose-Capillary: 115 mg/dL — ABNORMAL HIGH (ref 70–99)
Glucose-Capillary: 99 mg/dL (ref 70–99)

## 2019-07-10 LAB — CBC
HCT: 35.1 % — ABNORMAL LOW (ref 39.0–52.0)
Hemoglobin: 10.6 g/dL — ABNORMAL LOW (ref 13.0–17.0)
MCH: 25.5 pg — ABNORMAL LOW (ref 26.0–34.0)
MCHC: 30.2 g/dL (ref 30.0–36.0)
MCV: 84.6 fL (ref 80.0–100.0)
Platelets: 241 10*3/uL (ref 150–400)
RBC: 4.15 MIL/uL — ABNORMAL LOW (ref 4.22–5.81)
RDW: 17.9 % — ABNORMAL HIGH (ref 11.5–15.5)
WBC: 7.8 10*3/uL (ref 4.0–10.5)
nRBC: 0 % (ref 0.0–0.2)

## 2019-07-10 LAB — BASIC METABOLIC PANEL
Anion gap: 8 (ref 5–15)
BUN: 18 mg/dL (ref 6–20)
CO2: 24 mmol/L (ref 22–32)
Calcium: 9.3 mg/dL (ref 8.9–10.3)
Chloride: 110 mmol/L (ref 98–111)
Creatinine, Ser: 0.64 mg/dL (ref 0.61–1.24)
GFR calc Af Amer: >60 mL/min (ref >60)
GFR calc non Af Amer: >60 mL/min (ref >60)
Glucose, Bld: 118 mg/dL — ABNORMAL HIGH (ref 70–99)
Potassium: 3.6 mmol/L (ref 3.5–5.1)
Sodium: 142 mmol/L (ref 135–145)

## 2019-07-10 LAB — MAGNESIUM: Magnesium: 1.8 mg/dL (ref 1.7–2.4)

## 2019-07-10 LAB — PHOSPHORUS: Phosphorus: 4.7 mg/dL — ABNORMAL HIGH (ref 2.5–4.6)

## 2019-07-10 MED ORDER — CLONAZEPAM 0.25 MG PO TBDP
0.2500 mg | ORAL_TABLET | Freq: Three times a day (TID) | ORAL | Status: DC
  Administered 2019-07-10 – 2019-07-14 (×15): 0.25 mg
  Filled 2019-07-10 (×15): qty 1

## 2019-07-10 MED ORDER — HALOPERIDOL LACTATE 5 MG/ML IJ SOLN
5.0000 mg | Freq: Four times a day (QID) | INTRAMUSCULAR | Status: DC | PRN
  Administered 2019-07-11 – 2019-07-18 (×8): 5 mg via INTRAVENOUS
  Filled 2019-07-10 (×8): qty 1

## 2019-07-10 MED ORDER — CEPHALEXIN 500 MG PO CAPS
500.0000 mg | ORAL_CAPSULE | Freq: Four times a day (QID) | ORAL | Status: AC
  Administered 2019-07-10 – 2019-07-13 (×14): 500 mg
  Filled 2019-07-10 (×14): qty 1

## 2019-07-10 MED ORDER — OXYCODONE HCL 5 MG PO TABS
5.0000 mg | ORAL_TABLET | Freq: Four times a day (QID) | ORAL | Status: DC
  Administered 2019-07-10 – 2019-07-11 (×4): 5 mg
  Filled 2019-07-10 (×5): qty 1

## 2019-07-10 MED ORDER — QUETIAPINE FUMARATE 50 MG PO TABS
100.0000 mg | ORAL_TABLET | Freq: Two times a day (BID) | ORAL | Status: DC
  Administered 2019-07-10 – 2019-07-11 (×3): 100 mg
  Filled 2019-07-10 (×3): qty 2

## 2019-07-10 MED ORDER — QUETIAPINE FUMARATE 50 MG PO TABS
100.0000 mg | ORAL_TABLET | Freq: Two times a day (BID) | ORAL | Status: DC
  Administered 2019-07-10: 100 mg via ORAL
  Filled 2019-07-10: qty 2

## 2019-07-10 NOTE — Progress Notes (Signed)
RT note: patient placed on 40% ATC.  Currently tolerating well.  Will continue to monitor.

## 2019-07-10 NOTE — Progress Notes (Signed)
PULMONARY / CRITICAL CARE MEDICINE  NAME:  [name]., MRN:  [id number], DOB:  12-05-1960, LOS: 24 ADMISSION DATE:  06/12/2019, CONSULTATION DATE:  06/12/2019 REFERRING MD:  Alinda Sierras, CHIEF COMPLAINT:  Shortness of breath, sore throat, and neck swelling  BRIEF HISTORY:    59 year old man with a history of alcohol use disorder, tobacco used disorder, HTN, and recent admission on 04/10/2019 for pancreatitis and hyponatremia who presents for evaluation of shortness of breath and neck swelling. Reported  to have started 5 days ago and progressively gotten worse. Patient tachycardic to 160's and tachypnic to the 40's in the ED. Seen in ED, patient says he has neck swelling , cough, and difficulty breathing. Admitted to ICU on 1/4 with loss of airway and PEA arrest requiring emergent tracheostomy. Patient has been difficult to wean off sedation   SIGNIFICANT PAST MEDICAL HISTORY    Past Medical History:  Diagnosis Date  . Alcohol abuse   . Alcoholic ([place])   . Back pain   . CAP (community acquired pneumonia) 11/26/2018  . COPD (chronic obstructive pulmonary disease) ([place])   . Empyema of right pleural space ([place]) 11/26/2018  . Heavy smoker   . Hep C w/ coma, chronic ([place])   . Loose, teeth   . Pancreatitis   . Poor dentition   . Tobacco abuse    SIGNIFICANT EVENTS:  Admit to ICU on 1/4  1/4 : Lost airway and PEA arrest on, requiring emergent tracheostomy and ROSC obtained 1/5: 4/4 Blood cultures growing H.Influenza , narrowed to Unasyn 1/5 > 1/14: Continued on antibiotics without any significant improvement  1/14: New right foot swelling for which MRI obtained due to concerns for seeding of cellulitis from neck 1/15: MRI with synovitis and subcutaneous edema suggestive of cellulitis; orthopedics consulted for right ankle aspiration - aspirate with positive crystals suggestive of gout - patient started on NSAIDs and steroids 1/16 Started on dilaudid, D/C fentanyl  1/17: Patient agitated  early AM, q2h prn versed prescribed 1/18: Hb 5.2, transfused 2u pRBC; started methadone  1/19: Klonopin increased to tid and metoprolol added for SVTs 1/20: precedex gtt discontinued overnight for concerns of bradycardia  1/21: clonidine taper, 24hr trach collar 1/22: continuing trach collar 1/23: last day of unasyn for cellulitis, steroid taper and colchicine for gout, continuing trach collar  1/24: 72 hours of trach collar 1/25 changed to 4 cuffless 1/26 sedation cut back 1/30 returned to ICU for hypercarbic respiratory failure and encephalopathy  CULTURES:  Influenza A : Negative Influenza B : Negtive Sars Coronavirus: Negative MRSA Nasopharyngeal negative 1/4 Blood cultures: 4/4 Positive for H. Influenza  Urine culture: No growth 1/6 Blood cultures: NGTD on 1/11 1/13 Tracheal aspirate: few klebsiella pneumoniae  1/16 R ankle aspirate : monosodium urate crystals, no organisms on gram stain; culture w/ no growth to date 1/27: Sputum culture>>> klebsiella ANTIBIOTICS:  Bactrim 1/4 Azithromycin 1/4  Vancomycin 1/4 Zosyn  1/4 one dose Clindamycin 1/4 one dose Unasyn 1/5 >> 1/23  Ceftriaxone >> 1/29 for klebsiella   LINES/TUBES:  Tracheostomy - placed 1/4 Peripheral IV in Right Forearm - placed 1/9 Peripheral IV in Left Forearm - placed 1/9 PICC line double lumen - placed 1/15  CONSULTANTS:  Infectious Disease  ENT  Orthopedic surgery  SUBJECTIVE:  In 4 point restraints on max precedex and 100 fentanyl.   CONSTITUTIONAL: BP (!) 162/74   Pulse (!) 59   Temp 97.8 F (36.6 C) (Oral)   Resp 20   Ht 5\' 8"  (  1.727 m)   Wt 71.4 kg   SpO2 100%   BMI 23.93 kg/m   I/O last 3 completed shifts: In: 4446.4 [I.V.:1121.4; Other:300; NG/GT:2875; IV Piggyback:150] Out: 1350 [Urine:1350]   Vent Mode: PRVC FiO2 (%):  [35 %-40 %] 40 % Set Rate:  [20 bmp] 20 bmp Vt Set:  [540 mL] 540 mL PEEP:  [5 cmH20] 5 cmH20 Plateau Pressure:  [17 cmH20-20 cmH20] 17 cmH20   PHYSICAL  EXAM: GEN: Acute on chronically ill appearing male, NAD HEENT: Size 6 cuffed trach in proper position [place]/AT, PERRL, EOM-I and MMM CV: RRR, Nl S1/S2 and -M/R/G PULM: Coarse BS diffusely GI: Soft, NT, ND and +BS EXT: -edema and -tenderness NEURO: RASS +2, moving all 4 extremities. Follows commands SKIN: No rashes  I reviewed CXR myself, new trach is in a good position  ASSESSMENT AND PLAN   Severe Soft Tissue Cellulitis of left neck resulting in Acute Airway compromise s/p emergent percutaneous tracheostomy->completed 21 days Unasyn H. Influenza Bacteremia 4/4 bottles  Agitation/ TME superimposed on Alcohol Use Disorder Intermittent SVT Generalized edema/anasarca: RUE Superficial thrombophlebitis:   Pulmonary and associated problem List Tracheostomy dependence s/p upper airway obstruction from left neck cellulitis.  Diffuse bilateral airspace disease (treated aspiration) Klebsiella bronchitis Left basilar atelectasis Residual acute metabolic encephalopathy Encephalopathic with 20 of methadone and now hypercarbic  - PS trials and advance to TC quickly if able. Would do over 12 hours today if possible.  - Keep cuffed trach in for now until insure vent is not needed - will increase seroquel, add back klonopin - 7 days abx for Klebsiella (1/29-2/5.) narrow from ceftriaxone to keflex - Restart TF via NGT for now - Continue free water - Hold diureses - Continue anti-HTN - BMET in AM - Replace K and Mg - increase seroquel to 50 BID - will start 5 mg every 6 hours of oxycodone. Need to taper fentanyl.  The patient is critically ill with multiple organ systems failure and requires high complexity decision making for assessment and support, frequent evaluation and titration of therapies, application of advanced monitoring technologies and extensive interpretation of multiple databases.   Critical Care Time devoted to patient care services described in this note is 32 minutes. This time  reflects time of care of this [place] . This critical care time does not reflect separately billable procedures or procedure time, teaching time or supervisory time of PA/NP/Med student/Med Resident etc but could involve care discussion time.  [facility] and [facility] Medicine 07/10/2019 8:59 AM  Pager: [phone] After hours pager: [phone]

## 2019-07-10 NOTE — Progress Notes (Signed)
PT Cancellation Note  Patient Details Name: [name]. MRN: [id number] DOB: 10-10-1960   Cancelled Treatment:    Reason Eval/Treat Not Completed: Medical issues which prohibited therapy; patient with respiratory failure and replaced trach over weekend.  Rn reports difficulty getting pt settled all day due to agitation and now sleeping.  Will attempt again another day.    Reginia Naas 07/10/2019, 2:42 PM  Magda Kiel, [place] [phone] 07/10/2019

## 2019-07-11 LAB — CBC WITH DIFFERENTIAL/PLATELET
Abs Immature Granulocytes: 0.02 10*3/uL (ref 0.00–0.07)
Basophils Absolute: 0.1 10*3/uL (ref 0.0–0.1)
Basophils Relative: 1 %
Eosinophils Absolute: 0.7 10*3/uL — ABNORMAL HIGH (ref 0.0–0.5)
Eosinophils Relative: 10 %
HCT: 34.5 % — ABNORMAL LOW (ref 39.0–52.0)
Hemoglobin: 10.2 g/dL — ABNORMAL LOW (ref 13.0–17.0)
Immature Granulocytes: 0 %
Lymphocytes Relative: 22 %
Lymphs Abs: 1.5 10*3/uL (ref 0.7–4.0)
MCH: 24.8 pg — ABNORMAL LOW (ref 26.0–34.0)
MCHC: 29.6 g/dL — ABNORMAL LOW (ref 30.0–36.0)
MCV: 83.9 fL (ref 80.0–100.0)
Monocytes Absolute: 0.5 10*3/uL (ref 0.1–1.0)
Monocytes Relative: 8 %
Neutro Abs: 4 10*3/uL (ref 1.7–7.7)
Neutrophils Relative %: 59 %
Platelets: 194 10*3/uL (ref 150–400)
RBC: 4.11 MIL/uL — ABNORMAL LOW (ref 4.22–5.81)
RDW: 18 % — ABNORMAL HIGH (ref 11.5–15.5)
WBC: 6.8 10*3/uL (ref 4.0–10.5)
nRBC: 0 % (ref 0.0–0.2)

## 2019-07-11 LAB — BASIC METABOLIC PANEL
Anion gap: 10 (ref 5–15)
BUN: 18 mg/dL (ref 6–20)
CO2: 23 mmol/L (ref 22–32)
Calcium: 8.7 mg/dL — ABNORMAL LOW (ref 8.9–10.3)
Chloride: 107 mmol/L (ref 98–111)
Creatinine, Ser: 0.62 mg/dL (ref 0.61–1.24)
GFR calc Af Amer: >60 mL/min (ref >60)
GFR calc non Af Amer: >60 mL/min (ref >60)
Glucose, Bld: 117 mg/dL — ABNORMAL HIGH (ref 70–99)
Potassium: 3.5 mmol/L (ref 3.5–5.1)
Sodium: 140 mmol/L (ref 135–145)

## 2019-07-11 LAB — MAGNESIUM: Magnesium: 1.5 mg/dL — ABNORMAL LOW (ref 1.7–2.4)

## 2019-07-11 LAB — GLUCOSE, CAPILLARY
Glucose-Capillary: 101 mg/dL — ABNORMAL HIGH (ref 70–99)
Glucose-Capillary: 120 mg/dL — ABNORMAL HIGH (ref 70–99)
Glucose-Capillary: 79 mg/dL (ref 70–99)
Glucose-Capillary: 83 mg/dL (ref 70–99)
Glucose-Capillary: 92 mg/dL (ref 70–99)
Glucose-Capillary: 99 mg/dL (ref 70–99)

## 2019-07-11 MED ORDER — FREE WATER
100.0000 mL | Freq: Three times a day (TID) | Status: DC
  Administered 2019-07-11 – 2019-07-18 (×21): 100 mL

## 2019-07-11 MED ORDER — MAGNESIUM SULFATE 2 GM/50ML IV SOLN
2.0000 g | Freq: Once | INTRAVENOUS | Status: AC
  Administered 2019-07-11: 2 g via INTRAVENOUS
  Filled 2019-07-11: qty 50

## 2019-07-11 MED ORDER — BETHANECHOL CHLORIDE 5 MG PO TABS
5.0000 mg | ORAL_TABLET | Freq: Three times a day (TID) | ORAL | Status: DC
  Administered 2019-07-11 – 2019-07-28 (×50): 5 mg
  Filled 2019-07-11 (×59): qty 1

## 2019-07-11 MED ORDER — MIDAZOLAM HCL 2 MG/2ML IJ SOLN
INTRAMUSCULAR | Status: AC
  Filled 2019-07-11: qty 2

## 2019-07-11 MED ORDER — BACITRACIN ZINC 500 UNIT/GM EX OINT
1.0000 "application " | TOPICAL_OINTMENT | Freq: Two times a day (BID) | CUTANEOUS | Status: DC
  Administered 2019-07-11 – 2019-08-03 (×44): 1 via TOPICAL
  Filled 2019-07-11 (×2): qty 28.4

## 2019-07-11 MED ORDER — FUROSEMIDE 10 MG/ML IJ SOLN
40.0000 mg | Freq: Every day | INTRAMUSCULAR | Status: DC
  Administered 2019-07-11 – 2019-07-17 (×7): 40 mg via INTRAVENOUS
  Filled 2019-07-11 (×7): qty 4

## 2019-07-11 MED ORDER — METHADONE HCL 5 MG/5ML PO SOLN: 5.0000 mg | Freq: Three times a day (TID) | ORAL | Status: DC

## 2019-07-11 MED ORDER — METHADONE HCL 10 MG PO TABS
5.0000 mg | ORAL_TABLET | Freq: Three times a day (TID) | ORAL | Status: DC
  Administered 2019-07-11 – 2019-07-14 (×9): 5 mg
  Filled 2019-07-11 (×9): qty 1

## 2019-07-11 MED ORDER — MIDAZOLAM HCL 2 MG/2ML IJ SOLN
2.0000 mg | INTRAMUSCULAR | Status: DC | PRN
  Administered 2019-07-11 – 2019-07-13 (×3): 2 mg via INTRAVENOUS
  Filled 2019-07-11 (×2): qty 2

## 2019-07-11 NOTE — Progress Notes (Signed)
Patient suddenly became highly agitated, delusional and violent. He almost brokeout of the four point restraints and managed to kick RN while attempting to calm him down. He was also attempting to pull out lines/tubes. RN maxed out sedation. Elink was able to witness patient being agitated through the camera system. Orders given.

## 2019-07-11 NOTE — Progress Notes (Signed)
PULMONARY / CRITICAL CARE MEDICINE  NAME:  [name]., MRN:  [id number], DOB:  1960-12-28, LOS: 19 ADMISSION DATE:  06/12/2019, CONSULTATION DATE:  06/12/2019 REFERRING MD:  Alinda Sierras, CHIEF COMPLAINT:  Shortness of breath, sore throat, and neck swelling  BRIEF HISTORY:    59 year old man with a history of alcohol use disorder, tobacco used disorder, HTN, and recent admission on 04/10/2019 for pancreatitis and hyponatremia who presents for evaluation of shortness of breath and neck swelling. Reported  to have started 5 days ago and progressively gotten worse. Patient tachycardic to 160's and tachypnic to the 40's in the ED. Seen in ED, patient says he has neck swelling , cough, and difficulty breathing. Admitted to ICU on 1/4 with loss of airway and PEA arrest requiring emergent tracheostomy. Patient has been difficult to wean off sedation   SIGNIFICANT PAST MEDICAL HISTORY    Past Medical History:  Diagnosis Date  . Alcohol abuse   . Alcoholic ([place])   . Back pain   . CAP (community acquired pneumonia) 11/26/2018  . COPD (chronic obstructive pulmonary disease) ([place])   . Empyema of right pleural space ([place]) 11/26/2018  . Heavy smoker   . 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 Loose, teeth   . Pancreatitis   . Poor dentition   . Tobacco abuse    SIGNIFICANT EVENTS:  Admit to ICU on 1/4  1/4 : Lost airway and PEA arrest on, requiring emergent tracheostomy and ROSC obtained 1/5: 4/4 Blood cultures growing H.Influenza , narrowed to Unasyn 1/5 > 1/14: Continued on antibiotics without any significant improvement  1/14: New right foot swelling for which MRI obtained due to concerns for seeding of cellulitis from neck 1/15: MRI with synovitis and subcutaneous edema suggestive of cellulitis; orthopedics consulted for right ankle aspiration - aspirate with positive crystals suggestive of gout - patient started on NSAIDs and steroids 1/16 Started on dilaudid, D/C fentanyl  1/17: Patient agitated  early AM, q2h prn versed prescribed 1/18: Hb 5.2, transfused 2u pRBC; started methadone  1/19: Klonopin increased to tid and metoprolol added for SVTs 1/20: precedex gtt discontinued overnight for concerns of bradycardia  1/21: clonidine taper, 24hr trach collar 1/22: continuing trach collar 1/23: last day of unasyn for cellulitis, steroid taper and colchicine for gout, continuing trach collar  1/24: 72 hours of trach collar 1/25 changed to 4 cuffless 1/26 sedation cut back 1/30 returned to ICU for hypercarbic respiratory failure and encephalopathy  CULTURES:  Influenza A : Negative Influenza B : Negtive Sars Coronavirus: Negative MRSA Nasopharyngeal negative 1/4 Blood cultures: 4/4 Positive for H. Influenza  Urine culture: No growth 1/6 Blood cultures: NGTD on 1/11 1/13 Tracheal aspirate: few klebsiella pneumoniae  1/16 R ankle aspirate : monosodium urate crystals, no organisms on gram stain; culture w/ no growth to date 1/27: Sputum culture>>> klebsiella ANTIBIOTICS:  Bactrim 1/4 Azithromycin 1/4  Vancomycin 1/4 Zosyn  1/4 one dose Clindamycin 1/4 one dose Unasyn 1/5 >> 1/23  Ceftriaxone >> 1/29 for klebsiella   LINES/TUBES:  Tracheostomy - placed 1/4 Peripheral IV in Right Forearm - placed 1/9 Peripheral IV in Left Forearm - placed 1/9 PICC line double lumen - placed 1/15  CONSULTANTS:  Infectious Disease  ENT  Orthopedic surgery  SUBJECTIVE:  Deeply sedated due to overnight agitation on max fentanyl and precedex. Had episode of agitation which caused hypoxemia yesterday afternoon as well.   CONSTITUTIONAL: BP (!) 161/83   Pulse 74   Temp (!) 97.2 F (  36.2 C) (Axillary)   Resp 20   Ht 5\' 8"  (1.727 m)   Wt 71.5 kg   SpO2 100%   BMI 23.97 kg/m   I/O last 3 completed shifts: In: 4434.1 [I.V.:1604.2; Other:400; NG/GT:2430] Out: 1335 [Urine:1335]   Vent Mode: PRVC FiO2 (%):  [40 %] 40 % Set Rate:  [20 bmp] 20 bmp Vt Set:  [540 mL] 540 mL PEEP:  [5  cmH20] 5 cmH20 Plateau Pressure:  [16 cmH20-20 cmH20] 17 cmH20   PHYSICAL EXAM: GEN: Acute on chronically ill appearing male, NAD HEENT: Size 6 cuffed trach in proper position [place]/AT, PERRL, EOM-I and MMM CV: RRR, Nl S1/S2 and -M/R/G PULM: Coarse BS diffusely GI: Soft, NT, ND and +BS EXT: -edema and -tenderness NEURO: deeply sedated RASS -2 SKIN: No rashes  I reviewed CXR myself from 2/1, new trach is in a good position without mucus plug  ASSESSMENT AND PLAN   Severe Soft Tissue Cellulitis of left neck resulting in Acute Airway compromise s/p emergent percutaneous tracheostomy->completed 21 days Unasyn H. Influenza Bacteremia 4/4 bottles  Agitation/ TME superimposed on Alcohol Use Disorder Intermittent SVT Generalized edema/anasarca: RUE Superficial thrombophlebitis:   Pulmonary and associated problem List Tracheostomy dependence s/p upper airway obstruction from left neck cellulitis.  Diffuse bilateral airspace disease (treated aspiration) Klebsiella bronchitis Left basilar atelectasis Residual acute metabolic encephalopathy Encephalopathic with 20 of methadone and now hypercarbic  - PS trials and advance to TC quickly given he was previously vent liberated - no aggressive plans to decannulate given his tenuous status.  - continue seroquel and klonipine - keflex until 2/5 for Klebsiella  pna - Continue free water but will decrease slightly - lasix 40 mg IV once daily to keep him even to slightly negative daily.  - Continue anti-HTN - BMET in AM - Replace K and Mg.  - will schedule methadone again. Patient was not receving methadone on the floor for 2-3 days and then received a 20 mg dose reportedly. Methadone was the only way he got off continuous IV sedation previously  The patient is critically ill with multiple organ systems failure and requires high complexity decision making for assessment and support, frequent evaluation and titration of therapies, application of  advanced monitoring technologies and extensive interpretation of multiple databases.   Critical Care Time devoted to patient care services described in this note is 32 minutes. This time reflects time of care of this [place] . This critical care time does not reflect separately billable procedures or procedure time, teaching time or supervisory time of PA/NP/Med student/Med Resident etc but could involve care discussion time.  Leone [facility] 07/11/2019 9:06 AM  Pager: [phone] After hours pager: [phone]

## 2019-07-11 NOTE — Progress Notes (Addendum)
SLP Cancellation Note  Patient Details Name: [name]. MRN: [id number] DOB: Jan 08, 1961   Cancelled treatment:        Attempted to see pt for ongoing dysphagia management.  Pt is not medically appropriate at this time.  Change in medical status over weekend.  Trach changed from cuffless 4 to Cuffed 6 and pt now on ventilator support. Pt is also heavily sedated at this time following extreme agitation this morning.  SLP will continue to follow and will re-evaluate when medically appropriate.   Celedonio Savage, MA, [place] Office: [phone] 07/11/2019, 9:18 AM

## 2019-07-11 NOTE — Progress Notes (Signed)
PT Cancellation Note  Patient Details Name: [name]. MRN: [id number] DOB: [birth year]   Cancelled Treatment:    Reason Eval/Treat Not Completed: Patient not medically ready. Pt with increased agitation this AM, now on max sedation. Will follow-up for PT treatment as appropriate.  Mabeline Caras, PT, DPT Acute Rehabilitation Services  Pager [phone] Office [place] 07/11/2019, 8:03 AM

## 2019-07-11 NOTE — Progress Notes (Signed)
Nutrition Follow-up  DOCUMENTATION CODES:   Non-severe (moderate) malnutrition in context of chronic illness  INTERVENTION:   Continue Vital AF 1.2 at 65 ml/h via Cortrak.  Provides 1872 kcal, 117 gm protein, 1265 ml free water daily.  NUTRITION DIAGNOSIS:   Moderate Malnutrition related to chronic illness(COPD, alcoholism) as evidenced by mild fat depletion, moderate fat depletion, mild muscle depletion.  Ongoing   GOAL:   Patient will meet greater than or equal to 90% of their needs  Met with TF  MONITOR:   Diet advancement, Labs, Weight trends, TF tolerance, I & O's, Skin  ASSESSMENT:   59 yo male admitted with SOB, neck swelling, sore throat. Found to have H. flu in blood. S/P PEA arrest requiring emergent tracheostomy 1/4. PMH includes alcohol abuse, dysphagia, chronic hepatitis C, heavy smoker, COPD, poor dentition.  Trach placed 1/4.  Cortrak replaced 1/29. Patient transferred back to the ICU on 1/30 due to respiratory failure and encephalopathy. Trach required emergent replacement on 1/30 and patient has since required vent support.  Currently receiving Vital AF 1.2 at 65 ml/h, tolerating well, providing 1872 kcal, 117 gm protein, 1265 ml free water daily.  Patient is currently on ventilator support via trach. MV: 12.5 L/min Temp (24hrs), Avg:98 F (36.7 C), Min:96.9 F (36.1 C), Max:98.7 F (37.1 C)   Labs reviewed. Magnesium 1.5 (L) CBG's: 99-120  Medications reviewed and include folic acid, lasix, novolog, thiamine.  Admission weight 69.6 kg, current weight 71.5 kg I/O +1.2 L   Diet Order:   Diet Order            Diet NPO time specified  Diet effective now              EDUCATION NEEDS:   Not appropriate for education at this time  Skin:  Skin Assessment: Reviewed RN Assessment(MASD to buttocks)  Last BM:  2/2 rectal tube  Height:   Ht Readings from Last 1 Encounters:  06/12/19 5' 8"  (1.727 m)    Weight:   Wt Readings from  Last 1 Encounters:  07/11/19 71.5 kg    Ideal Body Weight:  70 kg  BMI:  Body mass index is 23.97 kg/m.  Estimated Nutritional Needs:   Kcal:  9432  Protein:  110-135 gm  Fluid:  >/= 1.8 L    Molli Barrows, RD, LDN, [place] Pager [phone] After Hours Pager [phone]

## 2019-07-11 NOTE — Progress Notes (Signed)
[place] Progress Note Patient Name: [name]. DOB: 1960-10-18 MRN: [id number]   Date of Service  07/11/2019  HPI/Events of Note  Agitation;  eICU Interventions  Will order: 1. Fentanyl IV infusion not at ceiling dose. Titrate up. 2. Increase ceiling on Precedex IV infusion to 1.6 mcg/kg/hour. 3. Versed 2 mg IV Q 2 hours PRN agitation.     Intervention Category Major Interventions: Delirium, psychosis, severe agitation - evaluation and management  [name] [place] 07/11/2019, 6:15 AM

## 2019-07-11 NOTE — Progress Notes (Signed)
[facility] Progress Note Patient Name: [name]. DOB: Nov 03, 1960 MRN: [id number]   Date of Service  07/11/2019  HPI/Events of Note  Request for AM lab orders.   eICU Interventions  Will order: 1. CBC with Platelets, BMP and Mg++ level in AM.     Intervention Category Major Interventions: Other:  Luca Dyar Cornelia Copa 07/11/2019, 4:30 AM

## 2019-07-12 LAB — BASIC METABOLIC PANEL
Anion gap: 12 (ref 5–15)
BUN: 15 mg/dL (ref 6–20)
CO2: 25 mmol/L (ref 22–32)
Calcium: 9.2 mg/dL (ref 8.9–10.3)
Chloride: 103 mmol/L (ref 98–111)
Creatinine, Ser: 0.66 mg/dL (ref 0.61–1.24)
GFR calc Af Amer: >60 mL/min (ref >60)
GFR calc non Af Amer: >60 mL/min (ref >60)
Glucose, Bld: 118 mg/dL — ABNORMAL HIGH (ref 70–99)
Potassium: 3.7 mmol/L (ref 3.5–5.1)
Sodium: 140 mmol/L (ref 135–145)

## 2019-07-12 LAB — GLUCOSE, CAPILLARY
Glucose-Capillary: 106 mg/dL — ABNORMAL HIGH (ref 70–99)
Glucose-Capillary: 122 mg/dL — ABNORMAL HIGH (ref 70–99)
Glucose-Capillary: 108 mg/dL — ABNORMAL HIGH (ref 70–99)
Glucose-Capillary: 99 mg/dL (ref 70–99)
Glucose-Capillary: 115 mg/dL — ABNORMAL HIGH (ref 70–99)
Glucose-Capillary: 101 mg/dL — ABNORMAL HIGH (ref 70–99)
Glucose-Capillary: 109 mg/dL — ABNORMAL HIGH (ref 70–99)

## 2019-07-12 LAB — MAGNESIUM: Magnesium: 1.6 mg/dL — ABNORMAL LOW (ref 1.7–2.4)

## 2019-07-12 MED ORDER — MAGNESIUM SULFATE 2 GM/50ML IV SOLN
2.0000 g | Freq: Once | INTRAVENOUS | Status: AC
  Administered 2019-07-12: 2 g via INTRAVENOUS
  Filled 2019-07-12: qty 50

## 2019-07-12 MED ORDER — QUETIAPINE FUMARATE 200 MG PO TABS
200.0000 mg | ORAL_TABLET | Freq: Two times a day (BID) | ORAL | Status: DC
  Administered 2019-07-12 – 2019-07-25 (×27): 200 mg
  Filled 2019-07-12: qty 4
  Filled 2019-07-12 (×4): qty 1
  Filled 2019-07-12: qty 2
  Filled 2019-07-12 (×2): qty 4
  Filled 2019-07-12 (×2): qty 1
  Filled 2019-07-12: qty 4
  Filled 2019-07-12: qty 1
  Filled 2019-07-12: qty 2
  Filled 2019-07-12: qty 4
  Filled 2019-07-12 (×2): qty 1
  Filled 2019-07-12 (×2): qty 4
  Filled 2019-07-12: qty 2
  Filled 2019-07-12: qty 4
  Filled 2019-07-12 (×2): qty 1
  Filled 2019-07-12: qty 2
  Filled 2019-07-12: qty 4
  Filled 2019-07-12 (×2): qty 1
  Filled 2019-07-12: qty 4
  Filled 2019-07-12 (×4): qty 2
  Filled 2019-07-12 (×2): qty 1
  Filled 2019-07-12: qty 2
  Filled 2019-07-12: qty 4
  Filled 2019-07-12: qty 1
  Filled 2019-07-12: qty 2
  Filled 2019-07-12 (×2): qty 4
  Filled 2019-07-12 (×2): qty 2
  Filled 2019-07-12: qty 1
  Filled 2019-07-12: qty 2
  Filled 2019-07-12: qty 1

## 2019-07-12 NOTE — Progress Notes (Addendum)
OT Cancellation Note  Patient Details Name: Candice Misner. MRN: [id number] DOB: [birth year]   Cancelled Treatment:    Reason Eval/Treat Not Completed: Other (comment); per PT and RN report, patient planned for R LE surgery today. OT to sign off.  Please re-consult after surgery to resume OT services when medically appropriate.   Jolaine Artist, OT Acute Rehabilitation Services Pager [phone] Office [place] 07/12/2019, 11:42 AM

## 2019-07-12 NOTE — Progress Notes (Signed)
PULMONARY / CRITICAL CARE MEDICINE  NAME:  William Halfpenny., MRN:  [id number], DOB:  06-Nov-1960, LOS: 45 ADMISSION DATE:  06/12/2019, CONSULTATION DATE:  06/12/2019 REFERRING MD:  Alinda Sierras, CHIEF COMPLAINT:  Shortness of breath, sore throat, and neck swelling  BRIEF HISTORY:    59 year old man with a history of alcohol use disorder, tobacco used disorder, HTN, and recent admission on 04/10/2019 for pancreatitis and hyponatremia who presents for evaluation of shortness of breath and neck swelling. Reported  to have started 5 days ago and progressively gotten worse. Patient tachycardic to 160's and tachypnic to the 40's in the ED. Seen in ED, patient says he has neck swelling , cough, and difficulty breathing. Admitted to ICU on 1/4 with loss of airway and PEA arrest requiring emergent tracheostomy. Patient has been difficult to wean off sedation   SIGNIFICANT PAST MEDICAL HISTORY    Past Medical History:  Diagnosis Date  . Alcohol abuse   . Alcoholic ([place])   . Back pain   . CAP (community acquired pneumonia) 11/26/2018  . COPD (chronic obstructive pulmonary disease) ([place])   . Empyema of right pleural space ([place]) 11/26/2018  . Heavy smoker   . Hep C w/ coma, chronic ([place])   . Loose, teeth   . Pancreatitis   . Poor dentition   . Tobacco abuse    SIGNIFICANT EVENTS:  Admit to ICU on 1/4  1/4 : Lost airway and PEA arrest on, requiring emergent tracheostomy and ROSC obtained 1/5: 4/4 Blood cultures growing H.Influenza , narrowed to Unasyn 1/5 > 1/14: Continued on antibiotics without any significant improvement  1/14: New right foot swelling for which MRI obtained due to concerns for seeding of cellulitis from neck 1/15: MRI with synovitis and subcutaneous edema suggestive of cellulitis; orthopedics consulted for right ankle aspiration - aspirate with positive crystals suggestive of gout - patient started on NSAIDs and steroids 1/16 Started on dilaudid, D/C fentanyl  1/17: Patient agitated  early AM, q2h prn versed prescribed 1/18: Hb 5.2, transfused 2u pRBC; started methadone  1/19: Klonopin increased to tid and metoprolol added for SVTs 1/20: precedex gtt discontinued overnight for concerns of bradycardia  1/21: clonidine taper, 24hr trach collar 1/22: continuing trach collar 1/23: last day of unasyn for cellulitis, steroid taper and colchicine for gout, continuing trach collar  1/24: 72 hours of trach collar 1/25 changed to 4 cuffless 1/26 sedation cut back 1/30 returned to ICU for hypercarbic respiratory failure and encephalopathy  CULTURES:  Influenza A : Negative Influenza B : Negtive Sars Coronavirus: Negative MRSA Nasopharyngeal negative 1/4 Blood cultures: 4/4 Positive for H. Influenza  Urine culture: No growth 1/6 Blood cultures: NGTD on 1/11 1/13 Tracheal aspirate: few klebsiella pneumoniae  1/16 R ankle aspirate : monosodium urate crystals, no organisms on gram stain; culture w/ no growth to date 1/27: Sputum culture>>> klebsiella ANTIBIOTICS:  Bactrim 1/4 Azithromycin 1/4  Vancomycin 1/4 Zosyn  1/4 one dose Clindamycin 1/4 one dose Unasyn 1/5 >> 1/23  Ceftriaxone >> 1/29 for klebsiella   LINES/TUBES:  Tracheostomy - placed 1/4 Peripheral IV in Right Forearm - placed 1/9 Peripheral IV in Left Forearm - placed 1/9 PICC line double lumen - placed 1/15  CONSULTANTS:  Infectious Disease  ENT  Orthopedic surgery  SUBJECTIVE:  On precedex and max fentanyl today. Was delirious overnight.  CONSTITUTIONAL: BP 107/63   Pulse 70   Temp 98.4 F (36.9 C) (Oral)   Resp 20   Ht 5\' 8"  (1.727 m)  Wt 69.5 kg   SpO2 100%   BMI 23.30 kg/m   I/O last 3 completed shifts: In: 5313.2 [I.V.:2109.1; Other:100; NG/GT:3104.1] Out: 3405 [Urine:3130; Stool:275]   Vent Mode: PSV;CPAP FiO2 (%):  [40 %] 40 % Set Rate:  [20 bmp] 20 bmp Vt Set:  [540 mL] 540 mL PEEP:  [5 cmH20] 5 cmH20 Pressure Support:  [5 cmH20] 5 cmH20 Plateau Pressure:  [13 cmH20-21  cmH20] 21 cmH20   PHYSICAL EXAM: GEN: Acute on chronically ill appearing male, NAD HEENT: Size 6 cuffed trach in proper position [place]/AT, PERRL, EOM-I and MMM CV: RRR, Nl S1/S2 and -M/R/G PULM: Coarse BS diffusely GI: Soft, NT, ND and +BS EXT: -edema and -tenderness NEURO: deeply sedated RASS -2 SKIN: No rashes  I reviewed CXR myself from 2/1, new trach is in a good position without mucus plug  ASSESSMENT AND PLAN   Severe Soft Tissue Cellulitis of left neck resulting in Acute Airway compromise s/p emergent percutaneous tracheostomy->completed 21 days Unasyn H. Influenza Bacteremia 4/4 bottles  Agitation/ TME superimposed on Alcohol Use Disorder Intermittent SVT Generalized edema/anasarca: RUE Superficial thrombophlebitis:   Pulmonary and associated problem List Tracheostomy dependence s/p upper airway obstruction from left neck cellulitis.  Diffuse bilateral airspace disease (treated aspiration) Klebsiella bronchitis Residual acute metabolic encephalopathy  - TC trials today, discussed with RT.  - no aggressive plans to decannulate given his tenuous status.  - keflex until 2/5 for Klebsiella  pna - Continue free water but will decrease slightly - lasix 40 mg IV once daily to keep him even to slightly negative daily.  - Continue anti-HTN - BMET in AM - Replace K and Mg today.  -continue methadone 5 mg TID. Next increase can be on 2/5.  - will increase seroquel to 200 mg BID, continue klonopin  The patient is critically ill with multiple organ systems failure and requires high complexity decision making for assessment and support, frequent evaluation and titration of therapies, application of advanced monitoring technologies and extensive interpretation of multiple databases.   Critical Care Time devoted to patient care services described in this note is 33 minutes. This time reflects time of care of this [place] . This critical care time does not reflect  separately billable procedures or procedure time, teaching time or supervisory time of PA/NP/Med student/Med Resident etc but could involve care discussion time.  Leone [facility] 07/12/2019 8:32 AM  Pager: [phone] After hours pager: [phone]

## 2019-07-12 NOTE — Progress Notes (Signed)
  Speech Language Pathology Treatment: Nada Boozer Speaking valve  Patient Details Name: [name]. MRN: [id number] DOB: August 03, 1960 Today's Date: 07/12/2019 Time: [id number] SLP Time Calculation (min) (ACUTE ONLY): 15 min  Assessment / Plan / Recommendation Clinical Impression  Pt seen for use of Passy-Muir speaking valve while pt on trach collar. He was lethargic and unable to wake for longer than 10 seconds. Cuff deflated without incidence and no immediate cough. SLP placed valve upon initiation of exhalatory phase to assist in transference of thick mucous via trach. Throat clears, random vocalizations and absence of air trapping indicate  patent upper airway. RR 18, HR 90 and SpO2 100. Given amount of thick secretions and his lethargy, SLP reinflated cuff. Will continue intervention.    HPI HPI: 59 year old man with a history of alcohol use disorder, tobacco use disorder, HTN, and recent admission on 04/10/2019 for pancreatitis and hyponatremia who presents for evaluation of shortness of breath and neck swelling with loss of airway and PEA arrest requiring emergent tracheostomy.      SLP Plan  Continue with current plan of care       Recommendations         Patient may use Passy-Muir Speech Valve: Intermittently with supervision PMSV Supervision: Full         Oral Care Recommendations: Oral care QID Follow up Recommendations: Skilled Nursing facility SLP Visit Diagnosis: Aphonia (R49.1) Plan: Continue with current plan of care                       Houston Siren 07/12/2019, 4:26 PM    Orbie Pyo Colvin Caroli.Ed Risk analyst [phone] Office [phone]

## 2019-07-12 NOTE — Progress Notes (Signed)
Physical Therapy Discharge Patient Details Name: [name]. MRN: [id number] DOB: 1960-10-24 Today's Date: 07/12/2019 Time:  -     Patient discharged from PT services secondary to surgery - will need to re-order PT to resume therapy services. Spoke with pt's RN and informed pt going for surgery for RLE amputation.   Please see latest therapy progress note for current level of functioning and progress toward goals.    Progress and discharge plan discussed with patient and/or caregiver: Patient unable to participate in discharge planning and no caregivers available  GP      Arby Barrette, PT Pager [phone]  Rexanne Mano 07/12/2019, 8:24 AM

## 2019-07-12 NOTE — Plan of Care (Signed)
  Problem: Clinical Measurements: Goal: Diagnostic test results will improve Outcome: Progressing Goal: Cardiovascular complication will be avoided Outcome: Progressing   

## 2019-07-13 LAB — GLUCOSE, CAPILLARY
Glucose-Capillary: 103 mg/dL — ABNORMAL HIGH (ref 70–99)
Glucose-Capillary: 111 mg/dL — ABNORMAL HIGH (ref 70–99)
Glucose-Capillary: 115 mg/dL — ABNORMAL HIGH (ref 70–99)
Glucose-Capillary: 136 mg/dL — ABNORMAL HIGH (ref 70–99)
Glucose-Capillary: 94 mg/dL (ref 70–99)
Glucose-Capillary: 95 mg/dL (ref 70–99)

## 2019-07-13 MED ORDER — HYDROMORPHONE BOLUS VIA INFUSION
0.5000 mg | INTRAVENOUS | Status: DC | PRN
  Administered 2019-07-13 – 2019-07-14 (×6): 0.5 mg via INTRAVENOUS
  Filled 2019-07-13: qty 1

## 2019-07-13 MED ORDER — HYDRALAZINE HCL 20 MG/ML IJ SOLN
10.0000 mg | INTRAMUSCULAR | Status: DC | PRN
  Administered 2019-07-13 – 2019-07-15 (×6): 10 mg via INTRAVENOUS
  Filled 2019-07-13 (×6): qty 1

## 2019-07-13 MED ORDER — SODIUM CHLORIDE 0.9 % IV SOLN
0.5000 mg/h | INTRAVENOUS | Status: DC
  Administered 2019-07-13: 1 mg/h via INTRAVENOUS
  Administered 2019-07-13 – 2019-07-16 (×5): 4 mg/h via INTRAVENOUS
  Filled 2019-07-13 (×7): qty 5

## 2019-07-13 NOTE — Progress Notes (Signed)
[place] Progress Note Patient Name: [name]. DOB: 1960/11/11 MRN: [id number]   Date of Service  07/13/2019  HPI/Events of Note  Hypertension - BP = 208/99.  eICU Interventions  Will order: 1. Hydralazine 10 mg IV Q 4 hours PRN SBP > 170.     Intervention Category Major Interventions: Hypertension - evaluation and management  [name] 07/13/2019, 11:20 PM

## 2019-07-13 NOTE — Progress Notes (Signed)
PULMONARY / CRITICAL CARE MEDICINE  NAME:  William Kadlec., MRN:  [id number], DOB:  January 22, 1961, LOS: 69 ADMISSION DATE:  06/12/2019, CONSULTATION DATE:  06/12/2019 REFERRING MD:  Alinda Sierras, CHIEF COMPLAINT:  Shortness of breath, sore throat, and neck swelling  BRIEF HISTORY:    59 year old man with a history of alcohol use disorder, tobacco used disorder, HTN, and recent admission on 04/10/2019 for pancreatitis and hyponatremia who presents for evaluation of shortness of breath and neck swelling. Reported  to have started 5 days ago and progressively gotten worse. Patient tachycardic to 160's and tachypnic to the 40's in the ED. Seen in ED, patient says he has neck swelling , cough, and difficulty breathing. Admitted to ICU on 1/4 with loss of airway and PEA arrest requiring emergent tracheostomy. Patient has been difficult to wean off sedation   SIGNIFICANT PAST MEDICAL HISTORY    Past Medical History:  Diagnosis Date  . Alcohol abuse   . Alcoholic ([place])   . Back pain   . CAP (community acquired pneumonia) 11/26/2018  . COPD (chronic obstructive pulmonary disease) ([place])   . Empyema of right pleural space ([place]) 11/26/2018  . Heavy smoker   . Hep C w/ coma, chronic ([place])   . Loose, teeth   . Pancreatitis   . Poor dentition   . Tobacco abuse    SIGNIFICANT EVENTS:  Admit to ICU on 1/4  1/4 : Lost airway and PEA arrest on, requiring emergent tracheostomy and ROSC obtained 1/5: 4/4 Blood cultures growing H.Influenza , narrowed to Unasyn 1/5 > 1/14: Continued on antibiotics without any significant improvement  1/14: New right foot swelling for which MRI obtained due to concerns for seeding of cellulitis from neck 1/15: MRI with synovitis and subcutaneous edema suggestive of cellulitis; orthopedics consulted for right ankle aspiration - aspirate with positive crystals suggestive of gout - patient started on NSAIDs and steroids 1/16 Started on dilaudid, D/C fentanyl  1/17: Patient agitated  early AM, q2h prn versed prescribed 1/18: Hb 5.2, transfused 2u pRBC; started methadone  1/19: Klonopin increased to tid and metoprolol added for SVTs 1/20: precedex gtt discontinued overnight for concerns of bradycardia  1/21: clonidine taper, 24hr trach collar 1/22: continuing trach collar 1/23: last day of unasyn for cellulitis, steroid taper and colchicine for gout, continuing trach collar  1/24: 72 hours of trach collar 1/25 changed to 4 cuffless 1/26 sedation cut back 1/30 returned to ICU for hypercarbic respiratory failure and encephalopathy  CULTURES:  Influenza A : Negative Influenza B : Negtive Sars Coronavirus: Negative MRSA Nasopharyngeal negative 1/4 Blood cultures: 4/4 Positive for H. Influenza  Urine culture: No growth 1/6 Blood cultures: NGTD on 1/11 1/13 Tracheal aspirate: few klebsiella pneumoniae  1/16 R ankle aspirate : monosodium urate crystals, no organisms on gram stain; culture w/ no growth to date 1/27: Sputum culture>>> klebsiella ANTIBIOTICS:  Bactrim 1/4 Azithromycin 1/4  Vancomycin 1/4 Zosyn  1/4 one dose Clindamycin 1/4 one dose Unasyn 1/5 >> 1/23  Ceftriaxone >> 1/29 for klebsiella   LINES/TUBES:  Tracheostomy - placed 1/4 Peripheral IV in Right Forearm - placed 1/9 Peripheral IV in Left Forearm - placed 1/9 PICC line double lumen - placed 1/15  CONSULTANTS:  Infectious Disease  ENT  Orthopedic surgery  SUBJECTIVE:  On precedex and max fentanyl today. Was delirious overnight.  CONSTITUTIONAL: BP (!) 102/58   Pulse (!) 55   Temp 97.9 F (36.6 C) (Axillary)   Resp 12   Ht 5\' 8"  (1.727 m)  Wt 68.8 kg   SpO2 99%   BMI 23.06 kg/m   I/O last 3 completed shifts: In: 4611.6 [I.V.:2326.5; NG/GT:2235; IV Piggyback:50.1] Out: 4575 [Urine:4275; Stool:300]   Vent Mode: Stand-by FiO2 (%):  [28 %-40 %] 28 %   PHYSICAL EXAM: GEN: Acute on chronically ill appearing male, NAD HEENT: Size 6 cuffed trach in proper position [place]/AT, PERRL,  EOM-I and MMM CV: RRR, Nl S1/S2 and -M/R/G PULM: Coarse BS diffusely GI: Soft, NT, ND and +BS EXT: -edema and -tenderness NEURO: deeply sedated RASS -2 SKIN: No rashes plug  ASSESSMENT AND PLAN   Severe Soft Tissue Cellulitis of left neck resulting in Acute Airway compromise s/p emergent percutaneous cricothyroidotomy->completed 21 days Unasyn for H. Influenza bacteremia.  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  Diffuse bilateral airspace disease (treated aspiration) Klebsiella Pneumonia Residual acute metabolic encephalopathy  - TC trials today as tolerated discussed with RT.  - no aggressive plans to decannulate given his tenuous status.  - keflex until 2/5 for Klebsiella pna - lasix 40 mg IV once daily to keep him even to slightly negative daily.  - Continue anti-HTN - BMET in AM - Replace K and Mg today.  -continue methadone 5 mg TID. Next increase can be on 2/5.  - will increase seroquel to 200 mg BID, continue klonopin - will trial a switch from fentanyl to dilaudid to see if continuous sedation can be decreased.   The patient is critically ill with multiple organ systems failure and requires high complexity decision making for assessment and support, frequent evaluation and titration of therapies, application of advanced monitoring technologies and extensive interpretation of multiple databases.   Critical Care Time devoted to patient care services described in this note is 32 minutes. This time reflects time of care of this [place] . This critical care time does not reflect separately billable procedures or procedure time, teaching time or supervisory time of PA/NP/Med student/Med Resident etc but could involve care discussion time.  Leone Haven  Pulmonary and Critical Care Medicine 07/13/2019 9:26 AM  Pager: [phone] After hours pager: [phone]

## 2019-07-14 LAB — BASIC METABOLIC PANEL
Anion gap: 12 (ref 5–15)
BUN: 12 mg/dL (ref 6–20)
CO2: 26 mmol/L (ref 22–32)
Calcium: 9.4 mg/dL (ref 8.9–10.3)
Chloride: 100 mmol/L (ref 98–111)
Creatinine, Ser: 0.5 mg/dL — ABNORMAL LOW (ref 0.61–1.24)
GFR calc Af Amer: >60 mL/min (ref >60)
GFR calc non Af Amer: >60 mL/min (ref >60)
Glucose, Bld: 118 mg/dL — ABNORMAL HIGH (ref 70–99)
Potassium: 3.3 mmol/L — ABNORMAL LOW (ref 3.5–5.1)
Sodium: 138 mmol/L (ref 135–145)

## 2019-07-14 LAB — GLUCOSE, CAPILLARY
Glucose-Capillary: 103 mg/dL — ABNORMAL HIGH (ref 70–99)
Glucose-Capillary: 110 mg/dL — ABNORMAL HIGH (ref 70–99)
Glucose-Capillary: 116 mg/dL — ABNORMAL HIGH (ref 70–99)
Glucose-Capillary: 109 mg/dL — ABNORMAL HIGH (ref 70–99)
Glucose-Capillary: 107 mg/dL — ABNORMAL HIGH (ref 70–99)

## 2019-07-14 LAB — MAGNESIUM: Magnesium: 1.4 mg/dL — ABNORMAL LOW (ref 1.7–2.4)

## 2019-07-14 MED ORDER — METHADONE HCL 10 MG PO TABS
10.0000 mg | ORAL_TABLET | Freq: Three times a day (TID) | ORAL | Status: DC
  Administered 2019-07-14 – 2019-07-20 (×18): 10 mg
  Filled 2019-07-14 (×18): qty 1

## 2019-07-14 MED ORDER — LOSARTAN POTASSIUM 25 MG PO TABS
25.0000 mg | ORAL_TABLET | Freq: Every day | ORAL | Status: DC
  Administered 2019-07-14 – 2019-07-23 (×10): 25 mg
  Filled 2019-07-14 (×10): qty 1

## 2019-07-14 MED ORDER — MAGNESIUM OXIDE 400 (241.3 MG) MG PO TABS
400.0000 mg | ORAL_TABLET | Freq: Every day | ORAL | Status: AC
  Administered 2019-07-15 – 2019-07-17 (×3): 400 mg via ORAL
  Filled 2019-07-14 (×3): qty 1

## 2019-07-14 MED ORDER — POTASSIUM CHLORIDE 20 MEQ PO PACK
40.0000 meq | PACK | ORAL | Status: AC
  Administered 2019-07-14 (×2): 40 meq
  Filled 2019-07-14 (×2): qty 2

## 2019-07-14 MED ORDER — MAGNESIUM SULFATE 2 GM/50ML IV SOLN
2.0000 g | INTRAVENOUS | Status: AC
  Administered 2019-07-14 (×2): 2 g via INTRAVENOUS
  Filled 2019-07-14 (×2): qty 50

## 2019-07-14 NOTE — Progress Notes (Signed)
[facility] Progress Note Patient Name: [name]. DOB: [birth year] MRN: [id number]   Date of Service  07/14/2019  HPI/Events of Note  Urinary retention associated with hypertension. Bladder scan with > 1300 mL residual. Has been I/O cathed several times in last 24 hours.   eICU Interventions  Will order: 1. Place Foley Catheter.      Intervention Category Major Interventions: Other:  Keymora Grillot Cornelia Copa 07/14/2019, 4:20 AM

## 2019-07-14 NOTE — Progress Notes (Addendum)
[facility] Progress Note Patient Name: [name]. DOB: 1960/12/26 MRN: [id number]   Date of Service  07/14/2019  HPI/Events of Note  Oliguria - Bladder scan with 999 mL residual.   eICU Interventions  Will order: 1. I/O cath PRN.      Intervention Category Major Interventions: Other:  Marika Mahaffy Cornelia Copa 07/14/2019, 1:19 AM

## 2019-07-14 NOTE — Progress Notes (Signed)
PULMONARY / CRITICAL CARE MEDICINE  NAME:  William Surace., MRN:  [id number], DOB:  02/11/1961, LOS: 23 ADMISSION DATE:  06/12/2019, CONSULTATION DATE:  06/12/2019 REFERRING MD:  Alinda Sierras, CHIEF COMPLAINT:  Shortness of breath, sore throat, and neck swelling  BRIEF HISTORY:    59 year old man with a history of alcohol use disorder, tobacco used disorder, HTN, and recent admission on 04/10/2019 for pancreatitis and hyponatremia who presents for evaluation of shortness of breath and neck swelling. Reported  to have started 5 days ago and progressively gotten worse. Patient tachycardic to 160's and tachypnic to the 40's in the ED. Seen in ED, patient says he has neck swelling , cough, and difficulty breathing. Admitted to ICU on 1/4 with loss of airway and PEA arrest requiring emergent tracheostomy. Patient has been difficult to wean off sedation   SIGNIFICANT PAST MEDICAL HISTORY    Past Medical History:  Diagnosis Date  . Alcohol abuse   . Alcoholic ([place])   . Back pain   . CAP (community acquired pneumonia) 11/26/2018  . COPD (chronic obstructive pulmonary disease) ([place])   . Empyema of right pleural space ([place]) 11/26/2018  . Heavy smoker   . Hep C w/ coma, chronic ([place])   . Loose, teeth   . Pancreatitis   . Poor dentition   . Tobacco abuse    SIGNIFICANT EVENTS:  Admit to ICU on 1/4  1/4 : Lost airway and PEA arrest on, requiring emergent tracheostomy and ROSC obtained 1/5: 4/4 Blood cultures growing H.Influenza , narrowed to Unasyn 1/5 > 1/14: Continued on antibiotics without any significant improvement  1/14: New right foot swelling for which MRI obtained due to concerns for seeding of cellulitis from neck 1/15: MRI with synovitis and subcutaneous edema suggestive of cellulitis; orthopedics consulted for right ankle aspiration - aspirate with positive crystals suggestive of gout - patient started on NSAIDs and steroids 1/16 Started on dilaudid, D/C fentanyl  1/17: Patient agitated  early AM, q2h prn versed prescribed 1/18: Hb 5.2, transfused 2u pRBC; started methadone  1/19: Klonopin increased to tid and metoprolol added for SVTs 1/20: precedex gtt discontinued overnight for concerns of bradycardia  1/21: clonidine taper, 24hr trach collar 1/22: continuing trach collar 1/23: last day of unasyn for cellulitis, steroid taper and colchicine for gout, continuing trach collar  1/24: 72 hours of trach collar 1/25 changed to 4 cuffless 1/26 sedation cut back 1/30 returned to ICU for hypercarbic respiratory failure and encephalopathy  CULTURES:  Influenza A : Negative Influenza B : Negtive Sars Coronavirus: Negative MRSA Nasopharyngeal negative 1/4 Blood cultures: 4/4 Positive for H. Influenza  Urine culture: No growth 1/6 Blood cultures: NGTD on 1/11 1/13 Tracheal aspirate: few klebsiella pneumoniae  1/16 R ankle aspirate : monosodium urate crystals, no organisms on gram stain; culture w/ no growth to date 1/27: Sputum culture>>> klebsiella ANTIBIOTICS:  Bactrim 1/4 Azithromycin 1/4  Vancomycin 1/4 Zosyn  1/4 one dose Clindamycin 1/4 one dose Unasyn 1/5 >> 1/23  Ceftriaxone >> 1/29 for klebsiella   LINES/TUBES:  Tracheostomy - placed 1/4 Peripheral IV in Right Forearm - placed 1/9 Peripheral IV in Left Forearm - placed 1/9 PICC line double lumen - placed 1/15  CONSULTANTS:  Infectious Disease  ENT  Orthopedic surgery  SUBJECTIVE:  Overnight urinary retention and required foley. Did TC for 48 hours.   CONSTITUTIONAL: BP (!) 189/105   Pulse (!) 113   Temp 98.2 F (36.8 C) (Oral)   Resp (!) 21   Ht  5\' 8"  (1.727 m)   Wt 69.7 kg   SpO2 99%   BMI 23.36 kg/m   I/O last 3 completed shifts: In: 4414.7 [I.V.:1774.7; NG/GT:2640] Out: [id number] [Urine:3725; Stool:800]   Vent Mode: Stand-by FiO2 (%):  [28 %] 28 %   PHYSICAL EXAM: GEN: Acute on chronically ill appearing male, NAD HEENT: Size 6 cuffed trach in proper position [place]/AT, PERRL, EOM-I and  MMM CV: RRR, Nl S1/S2 and -M/R/G PULM: Coarse BS diffusely GI: Soft, NT, ND and +BS EXT: -edema and -tenderness NEURO: deeply sedated RASS -2 SKIN: No rashes plug  ASSESSMENT AND PLAN   Severe Soft Tissue Cellulitis of left neck resulting in Acute Airway compromise s/p emergent percutaneous cricothyroidotomy->completed 21 days Unasyn for H. Influenza bacteremia.  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  Diffuse bilateral airspace disease (treated aspiration) Klebsiella Pneumonia Residual acute metabolic encephalopathy  - TC trials today as tolerated discussed with RT. He is now s/p 48 hours.  - no aggressive plans to decannulate given his tenuous status.  - keflex until 2/5 for Klebsiella pna - lasix 40 mg IV once daily to keep him even to slightly negative daily.  - Continue anti-HTN - BMET in AM - Replace K and Mg today.  - increase methadone to 10 mg TID today. - will increase seroquel to 200 mg BID, continue klonopin - will wean down to dilaudid 3 mg today - monitoring QTc - 446 today on EKG.   The patient is critically ill with multiple organ systems failure and requires high complexity decision making for assessment and support, frequent evaluation and titration of therapies, application of advanced monitoring technologies and extensive interpretation of multiple databases.   Critical Care Time devoted to patient care services described in this note is 34 minutes. This time reflects time of care of this [place] . This critical care time does not reflect separately billable procedures or procedure time, teaching time or supervisory time of PA/NP/Med student/Med Resident etc but could involve care discussion time.  Leone Haven Pulmonary and Critical  Care Medicine 07/14/2019 9:43 AM  Pager: [phone] After hours pager: [phone]

## 2019-07-15 DIAGNOSIS — F1021 Alcohol dependence, in remission: Secondary | ICD-10-CM

## 2019-07-15 LAB — MAGNESIUM: Magnesium: 1.8 mg/dL (ref 1.7–2.4)

## 2019-07-15 LAB — BASIC METABOLIC PANEL
Anion gap: 13 (ref 5–15)
BUN: 13 mg/dL (ref 6–20)
CO2: 26 mmol/L (ref 22–32)
Calcium: 9.5 mg/dL (ref 8.9–10.3)
Chloride: 99 mmol/L (ref 98–111)
Creatinine, Ser: 0.57 mg/dL — ABNORMAL LOW (ref 0.61–1.24)
GFR calc Af Amer: >60 mL/min (ref >60)
GFR calc non Af Amer: >60 mL/min (ref >60)
Glucose, Bld: 123 mg/dL — ABNORMAL HIGH (ref 70–99)
Potassium: 3.6 mmol/L (ref 3.5–5.1)
Sodium: 138 mmol/L (ref 135–145)

## 2019-07-15 LAB — GLUCOSE, CAPILLARY
Glucose-Capillary: 93 mg/dL (ref 70–99)
Glucose-Capillary: 112 mg/dL — ABNORMAL HIGH (ref 70–99)
Glucose-Capillary: 104 mg/dL — ABNORMAL HIGH (ref 70–99)
Glucose-Capillary: 120 mg/dL — ABNORMAL HIGH (ref 70–99)
Glucose-Capillary: 123 mg/dL — ABNORMAL HIGH (ref 70–99)
Glucose-Capillary: 109 mg/dL — ABNORMAL HIGH (ref 70–99)

## 2019-07-15 MED ORDER — CLONIDINE HCL 0.3 MG PO TABS
0.3000 mg | ORAL_TABLET | Freq: Four times a day (QID) | ORAL | Status: DC
  Administered 2019-07-15 – 2019-07-17 (×9): 0.3 mg via ORAL
  Filled 2019-07-15 (×9): qty 1

## 2019-07-15 MED ORDER — CLONAZEPAM 0.5 MG PO TBDP
0.5000 mg | ORAL_TABLET | Freq: Three times a day (TID) | ORAL | Status: DC
  Administered 2019-07-15 (×3): 0.5 mg
  Filled 2019-07-15 (×3): qty 1

## 2019-07-15 NOTE — Progress Notes (Signed)
PULMONARY / CRITICAL CARE MEDICINE  NAME:  William Renard., MRN:  [id number], DOB:  July 10, 1960, LOS: 69 ADMISSION DATE:  06/12/2019, CONSULTATION DATE:  06/12/2019 REFERRING MD:  Alinda Sierras, CHIEF COMPLAINT:  Shortness of breath, sore throat, and neck swelling  BRIEF HISTORY:    59 year old man with a history of alcohol use disorder, tobacco used disorder, HTN, and recent admission on 04/10/2019 for pancreatitis and hyponatremia who presents for evaluation of shortness of breath and neck swelling. Reported  to have started 5 days ago and progressively gotten worse. Patient tachycardic to 160's and tachypnic to the 40's in the ED. Seen in ED, patient says he has neck swelling , cough, and difficulty breathing. Admitted to ICU on 1/4 with loss of airway and PEA arrest requiring emergent tracheostomy. Patient has been difficult to wean off sedation   SIGNIFICANT PAST MEDICAL HISTORY    Past Medical History:  Diagnosis Date  . Alcohol abuse   . Alcoholic ([place])   . Back pain   . CAP (community acquired pneumonia) 11/26/2018  . COPD (chronic obstructive pulmonary disease) ([place])   . Empyema of right pleural space ([place]) 11/26/2018  . Heavy smoker   . Hep C w/ coma, chronic ([place])   . Loose, teeth   . Pancreatitis   . Poor dentition   . Tobacco abuse    SIGNIFICANT EVENTS:  Admit to ICU on 1/4  1/4 : Lost airway and PEA arrest on, requiring emergent tracheostomy and ROSC obtained 1/5: 4/4 Blood cultures growing H.Influenza , narrowed to Unasyn 1/5 > 1/14: Continued on antibiotics without any significant improvement  1/14: New right foot swelling for which MRI obtained due to concerns for seeding of cellulitis from neck 1/15: MRI with synovitis and subcutaneous edema suggestive of cellulitis; orthopedics consulted for right ankle aspiration - aspirate with positive crystals suggestive of gout - patient started on NSAIDs and steroids 1/16 Started on dilaudid, D/C fentanyl  1/17: Patient agitated  early AM, q2h prn versed prescribed 1/18: Hb 5.2, transfused 2u pRBC; started methadone  1/19: Klonopin increased to tid and metoprolol added for SVTs 1/20: precedex gtt discontinued overnight for concerns of bradycardia  1/21: clonidine taper, 24hr trach collar 1/22: continuing trach collar 1/23: last day of unasyn for cellulitis, steroid taper and colchicine for gout, continuing trach collar  1/24: 72 hours of trach collar 1/25 changed to 4 cuffless 1/26 sedation cut back 1/30 returned to ICU for hypercarbic respiratory failure and encephalopathy  CULTURES:  Influenza A : Negative Influenza B : Negtive Sars Coronavirus: Negative MRSA Nasopharyngeal negative 1/4 Blood cultures: 4/4 Positive for H. Influenza  Urine culture: No growth 1/6 Blood cultures: NGTD on 1/11 1/13 Tracheal aspirate: few klebsiella pneumoniae  1/16 R ankle aspirate : monosodium urate crystals, no organisms on gram stain; culture w/ no growth to date 1/27: Sputum culture>>> klebsiella ANTIBIOTICS:  Bactrim 1/4 Azithromycin 1/4  Vancomycin 1/4 Zosyn  1/4 one dose Clindamycin 1/4 one dose Unasyn 1/5 >> 1/23  Ceftriaxone >> 1/29 for klebsiella   LINES/TUBES:  Tracheostomy - placed 1/4 Peripheral IV in Right Forearm - placed 1/9 Peripheral IV in Left Forearm - placed 1/9 PICC line double lumen - placed 1/15  CONSULTANTS:  Infectious Disease  ENT  Orthopedic surgery  SUBJECTIVE:  Overnight urinary retention and required foley. Did TC for 48 hours.   CONSTITUTIONAL: BP (!) 196/125   Pulse (!) 128   Temp 97.8 F (36.6 C) (Axillary)   Resp (!) 33   Ht  5\' 8"  (1.727 m)   Wt 69.7 kg   SpO2 99%   BMI 23.36 kg/m   I/O last 3 completed shifts: In: 3673.1 [I.V.:1275.7; NG/GT:2335; IV Piggyback:62.3] Out: 6215 [Urine:5615; Stool:600]   Vent Mode: Stand-by FiO2 (%):  [28 %] 28 %   PHYSICAL EXAM: GEN: Acute on chronically ill appearing male, NAD HEENT: Size 6 cuffed trach in proper position  [place]/AT, PERRL, EOM-I and MMM CV: RRR, Nl S1/S2 and -M/R/G PULM: Coarse BS diffusely GI: Soft, NT, ND and +BS EXT: -edema and -tenderness NEURO: deeply sedated RASS -2 SKIN: No rashes plug  ASSESSMENT AND PLAN   Severe Soft Tissue Cellulitis of left neck resulting in Acute Airway compromise s/p emergent percutaneous cricothyroidotomy->completed 21 days Unasyn for H. Influenza bacteremia.  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  Diffuse bilateral airspace disease (treated aspiration) Klebsiella Pneumonia Residual acute metabolic encephalopathy  - TC trials today as tolerated discussed with RT. He is now s/p 48 hours.  - no aggressive plans to decannulate given his tenuous status.  - keflex until 2/5 for Klebsiella pna - lasix 40 mg IV once daily to keep him even to slightly negative daily.  - Continue anti-HTN - BMET in AM - Replace K and Mg today.  - increase methadone to 10 mg TID today. - will increase seroquel to 200 mg BID, continue klonopin - will wean down to dilaudid 3 mg today - monitoring QTc - 446 today on EKG.   The patient is critically ill with multiple organ systems failure and requires high complexity decision making for assessment and support, frequent evaluation and titration of therapies, application of advanced monitoring technologies and extensive interpretation of multiple databases.   Critical Care Time devoted to patient care services described in this note is 34 minutes. This time reflects time of care of this [place] . This critical care time does not reflect separately billable procedures or procedure time, teaching time or supervisory time of PA/NP/Med student/Med Resident etc but could involve care discussion time.  Leone Haven  Pulmonary and Critical Care Medicine 07/15/2019 9:06 AM  Pager: [phone] After hours pager: [phone] PULMONARY / CRITICAL CARE MEDICINE  NAME:  [name]., MRN:  [id number], DOB:  11/22/1960, LOS: 90 ADMISSION DATE:  06/12/2019, CONSULTATION DATE:  06/12/2019 REFERRING MD:  Alinda Sierras, CHIEF COMPLAINT:  Shortness of breath, sore throat, and neck swelling  BRIEF HISTORY:    59 year old man with a history of alcohol use disorder, tobacco used disorder, HTN, and recent admission on 04/10/2019 for pancreatitis and hyponatremia who presents for evaluation of shortness of breath and neck swelling. Reported  to have started 5 days ago and progressively gotten worse. Patient tachycardic to 160's and tachypnic to the 40's in the ED. Seen in ED, patient says he has neck swelling , cough, and difficulty breathing. Admitted to ICU on 1/4 with loss of airway and PEA arrest requiring emergent tracheostomy. Patient has been difficult to wean off sedation   SIGNIFICANT PAST MEDICAL HISTORY    Past Medical History:  Diagnosis Date  . Alcohol abuse   . Alcoholic ([place])   . Back pain   . CAP (community acquired pneumonia) 11/26/2018  . COPD (chronic obstructive pulmonary disease) ([place])   . Empyema of right pleural space ([place]) 11/26/2018  . Heavy smoker   .  Hep C w/ coma, chronic ([place])   . Loose, teeth   . Pancreatitis   . Poor dentition   . Tobacco abuse    SIGNIFICANT EVENTS:  Admit to ICU on 1/4  1/4 : Lost airway and PEA arrest on, requiring emergent tracheostomy and ROSC obtained 1/5: 4/4 Blood cultures growing H.Influenza , narrowed to Unasyn 1/5 > 1/14: Continued on antibiotics without any significant improvement  1/14: New right foot swelling for which MRI obtained due to concerns for seeding of cellulitis from neck 1/15: MRI with synovitis and subcutaneous edema suggestive of cellulitis; orthopedics consulted for right ankle aspiration - aspirate with positive crystals suggestive of  gout - patient started on NSAIDs and steroids 1/16 Started on dilaudid, D/C fentanyl  1/17: Patient agitated early AM, q2h prn versed prescribed 1/18: Hb 5.2, transfused 2u pRBC; started methadone  1/19: Klonopin increased to tid and metoprolol added for SVTs 1/20: precedex gtt discontinued overnight for concerns of bradycardia  1/21: clonidine taper, 24hr trach collar 1/22: continuing trach collar 1/23: last day of unasyn for cellulitis, steroid taper and colchicine for gout, continuing trach collar  1/24: 72 hours of trach collar 1/25 changed to 4 cuffless 1/26 sedation cut back 1/30 returned to ICU for hypercarbic respiratory failure and encephalopathy  CULTURES:  Influenza A : Negative Influenza B : Negtive Sars Coronavirus: Negative MRSA Nasopharyngeal negative 1/4 Blood cultures: 4/4 Positive for H. Influenza  Urine culture: No growth 1/6 Blood cultures: NGTD on 1/11 1/13 Tracheal aspirate: few klebsiella pneumoniae  1/16 R ankle aspirate : monosodium urate crystals, no organisms on gram stain; culture w/ no growth to date 1/27: Sputum culture>>> klebsiella ANTIBIOTICS:  Bactrim 1/4 Azithromycin 1/4  Vancomycin 1/4 Zosyn  1/4 one dose Clindamycin 1/4 one dose Unasyn 1/5 >> 1/23  Ceftriaxone >> 1/29 for klebsiella   LINES/TUBES:  Tracheostomy - placed 1/4 Peripheral IV in Right Forearm - placed 1/9 Peripheral IV in Left Forearm - placed 1/9 PICC line double lumen - placed 1/15  CONSULTANTS:  Infectious Disease  ENT  Orthopedic surgery  SUBJECTIVE:  Overnight no issues. He is now trach collaring for 72 hours.  CONSTITUTIONAL: BP (!) 196/125   Pulse (!) 128   Temp 97.8 F (36.6 C) (Axillary)   Resp (!) 33   Ht 5\' 8"  (1.727 m)   Wt 69.7 kg   SpO2 99%   BMI 23.36 kg/m   I/O last 3 completed shifts: In: 3673.1 [I.V.:1275.7; NG/GT:2335; IV Piggyback:62.3] Out: 6215 [Urine:5615; Stool:600]   Vent Mode: Stand-by FiO2 (%):  [28 %] 28 %   PHYSICAL  EXAM: GEN: Acute on chronically ill appearing male, NAD HEENT: Size 6 cuffed trach in proper position [place]/AT, PERRL, EOM-I and MMM CV: RRR, Nl S1/S2 and -M/R/G PULM: Coarse BS diffusely GI: Soft, NT, ND and +BS EXT: -edema and -tenderness NEURO: deeply sedated RASS -2 SKIN: No rashes   ASSESSMENT AND PLAN   Severe Soft Tissue Cellulitis of left neck resulting in Acute Airway compromise s/p emergent percutaneous cricothyroidotomy->completed 21 days Unasyn for H. Influenza bacteremia.  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  Diffuse bilateral airspace disease (treated aspiration) Klebsiella Pneumonia Residual acute metabolic encephalopathy  - TC trials today as tolerated discussed with RT. He is now s/p 72 hours. Ventilator liberation - no aggressive plans to decannulate given his tenuous status. A -  completed keflexfor Klebsiella pna - lasix 40 mg IV once daily to keep him even to slightly negative daily.  - Continue anti-HTN - BMET in AM - Replace K and Mg today.  - continue methadone to 10 mg TID on 2/5. Next increase 2/8 if helpful. - continue seroquel to 200 mg BID, increase klonopin to 0.5 mg TID - still on dilaudid 4mg /hour. Do not increase, try to taper down. Also maxed on precedex.  - monitoring QTc - 446 today on EKG.  - will add clonidine today to help with precedex tapering.  The patient is critically ill with multiple organ systems failure and requires high complexity decision making for assessment and support, frequent evaluation and titration of therapies, application of advanced monitoring technologies and extensive interpretation of multiple databases.   Critical Care Time devoted to patient care services described in this note is 33 minutes. This time reflects  time of care of this [place] . This critical care time does not reflect separately billable procedures or procedure time, teaching time or supervisory time of PA/NP/Med student/Med Resident etc but could involve care discussion time.  Leone Haven Pulmonary and Critical Care Medicine 07/15/2019 9:07 AM  Pager: [phone] After hours pager: [phone]

## 2019-07-16 DIAGNOSIS — F11221 Opioid dependence with intoxication delirium: Secondary | ICD-10-CM

## 2019-07-16 LAB — GLUCOSE, CAPILLARY
Glucose-Capillary: 101 mg/dL — ABNORMAL HIGH (ref 70–99)
Glucose-Capillary: 105 mg/dL — ABNORMAL HIGH (ref 70–99)
Glucose-Capillary: 108 mg/dL — ABNORMAL HIGH (ref 70–99)
Glucose-Capillary: 115 mg/dL — ABNORMAL HIGH (ref 70–99)
Glucose-Capillary: 98 mg/dL (ref 70–99)
Glucose-Capillary: 99 mg/dL (ref 70–99)

## 2019-07-16 MED ORDER — CLONAZEPAM 0.5 MG PO TBDP
1.0000 mg | ORAL_TABLET | Freq: Three times a day (TID) | ORAL | Status: DC
  Administered 2019-07-16 – 2019-07-17 (×4): 1 mg
  Filled 2019-07-16 (×4): qty 2

## 2019-07-16 NOTE — Progress Notes (Signed)
PULMONARY / CRITICAL CARE MEDICINE  NAME:  [name]., MRN:  [id number], DOB:  [birth year], LOS: 64 ADMISSION DATE:  06/12/2019, CONSULTATION DATE:  06/12/2019 REFERRING MD:  Alinda Sierras, CHIEF COMPLAINT:  Shortness of breath, sore throat, and neck swelling  BRIEF HISTORY:    59 year old man with a history of alcohol use disorder, tobacco used disorder, HTN, and recent admission on 04/10/2019 for pancreatitis and hyponatremia who presents for evaluation of shortness of breath and neck swelling. Reported  to have started 5 days ago and progressively gotten worse. Patient tachycardic to 160's and tachypnic to the 40's in the ED. Seen in ED, patient says he has neck swelling , cough, and difficulty breathing. Admitted to ICU on 1/4 with loss of airway and PEA arrest requiring emergent tracheostomy. Patient has been difficult to wean off sedation   SIGNIFICANT PAST MEDICAL HISTORY    Past Medical History:  Diagnosis Date  . Alcohol abuse   . Alcoholic ([place])   . Back pain   . CAP (community acquired pneumonia) 11/26/2018  . COPD (chronic obstructive pulmonary disease) ([place])   . Empyema of right pleural Pugh ([place]) 11/26/2018  . Heavy smoker   . Hep C w/ coma, chronic ([place])   . Loose, teeth   . Pancreatitis   . Poor dentition   . Tobacco abuse    SIGNIFICANT EVENTS:  Admit to ICU on 1/4  1/4 : Lost airway and PEA arrest on, requiring emergent tracheostomy and ROSC obtained 1/5: 4/4 Blood cultures growing H.Influenza , narrowed to Unasyn 1/5 > 1/14: Continued on antibiotics without any significant improvement  1/14: New right foot swelling for which MRI obtained due to concerns for seeding of cellulitis from neck 1/15: MRI with synovitis and subcutaneous edema suggestive of cellulitis; orthopedics consulted for right ankle aspiration - aspirate with positive crystals suggestive of gout - patient started on NSAIDs and steroids 1/16 Started on dilaudid, D/C fentanyl  1/17: Patient agitated  early AM, q2h prn versed prescribed 1/18: Hb 5.2, transfused 2u pRBC; started methadone  1/19: Klonopin increased to tid and metoprolol added for SVTs 1/20: precedex gtt discontinued overnight for concerns of bradycardia  1/21: clonidine taper, 24hr trach collar 1/22: continuing trach collar 1/23: last day of unasyn for cellulitis, steroid taper and colchicine for gout, continuing trach collar  1/24: 72 hours of trach collar 1/25 changed to 4 cuffless 1/26 sedation cut back 1/30 returned to ICU for hypercarbic respiratory failure and encephalopathy 2/6: ventilator liberated. Has been TC for 72 hours.  CULTURES:  Influenza A : Negative Influenza B : Negtive Sars Coronavirus: Negative MRSA Nasopharyngeal negative 1/4 Blood cultures: 4/4 Positive for H. Influenza  Urine culture: No growth 1/6 Blood cultures: NGTD on 1/11 1/13 Tracheal aspirate: few klebsiella pneumoniae  1/16 R ankle aspirate : monosodium urate crystals, no organisms on gram stain; culture w/ no growth to date 1/27: Sputum culture>>> klebsiella ANTIBIOTICS:  Bactrim 1/4 Azithromycin 1/4  Vancomycin 1/4 Zosyn  1/4 one dose Clindamycin 1/4 one dose Unasyn 1/5 >> 1/23  Ceftriaxone >> 1/29 for klebsiella  LINES/TUBES:  Tracheostomy - placed 1/4 Peripheral IV in Right Forearm - placed 1/9 Peripheral IV in Left Forearm - placed 1/9 PICC line double lumen - placed 1/15  CONSULTANTS:  Infectious Disease  ENT  Orthopedic surgery  SUBJECTIVE:  Overnight no issues. He is now trach collaring for 72 hours. flexiseal came out.  CONSTITUTIONAL: BP (!) 99/57   Pulse 62   Temp 98.1 F (36.7 C) (  Axillary)   Resp 12   Ht 5\' 8"  (1.727 m)   Wt 69.7 kg   SpO2 98%   BMI 23.36 kg/m   I/O last 3 completed shifts: In: 4221.1 [I.V.:1514.1; NG/GT:2707.1] Out: [id number] [Urine:3295; Stool:300]   FiO2 (%):  [28 %] 28 %   PHYSICAL EXAM: GEN: Acute on chronically ill appearing male, NAD HEENT: Size 6 cuffed trach in  proper position [place]/AT, PERRL, EOM-I and MMM CV: RRR, Nl S1/S2 and -M/R/G PULM: Coarse BS diffusely GI: Soft, NT, ND and +BS EXT: -edema and -tenderness NEURO: RAAS +2, agitated SKIN: No rashes   ASSESSMENT AND PLAN   Severe Soft Tissue Cellulitis of left neck resulting in Acute Airway compromise s/p emergent percutaneous cricothyroidotomy->completed 21 days Unasyn for H. Influenza bacteremia.  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  Diffuse bilateral airspace disease (treated aspiration) Klebsiella Pneumonia Residual acute metabolic encephalopathy  - TC trials today as tolerated discussed with RT. He is now s/p 72 hours. Ventilator liberation - no aggressive plans to decannulate given his tenuous status. A - completed keflexfor Klebsiella pna - lasix 40 mg IV once daily to keep him even to slightly negative daily.  - Continue anti-HTN - BMET in AM - Replace K and Mg today.  - continue methadone to 10 mg TID on 2/5. Next increase 2/8 if helpful. - continue seroquel to 200 mg BID, increase klonopin to 1 mg TID - still on dilaudid 4mg /hour. Do not increase, try to taper down. Also maxed on precedex.  - monitoring QTc - 446 on EKG previously, will repeat again today. - continue clonidine  The patient is critically ill with multiple organ systems failure and requires high complexity decision making for assessment and support, frequent evaluation and titration of therapies, application of advanced monitoring technologies and extensive interpretation of multiple databases.   Critical Care Time devoted to patient care services described in this note is 31 minutes. This time reflects time of care of this [place] . This critical care time does not reflect separately billable  procedures or procedure time, teaching time or supervisory time of PA/NP/Med student/Med Resident etc but could involve care discussion time.  Leone Haven Pulmonary and Critical Care Medicine 07/16/2019 8:20 AM  Pager: [phone] After hours pager: [phone]

## 2019-07-17 DIAGNOSIS — L8990 Pressure ulcer of unspecified site, unspecified stage: Secondary | ICD-10-CM | POA: Insufficient documentation

## 2019-07-17 LAB — GLUCOSE, CAPILLARY
Glucose-Capillary: 102 mg/dL — ABNORMAL HIGH (ref 70–99)
Glucose-Capillary: 104 mg/dL — ABNORMAL HIGH (ref 70–99)
Glucose-Capillary: 112 mg/dL — ABNORMAL HIGH (ref 70–99)
Glucose-Capillary: 114 mg/dL — ABNORMAL HIGH (ref 70–99)
Glucose-Capillary: 115 mg/dL — ABNORMAL HIGH (ref 70–99)
Glucose-Capillary: 94 mg/dL (ref 70–99)
Glucose-Capillary: 97 mg/dL (ref 70–99)

## 2019-07-17 LAB — BASIC METABOLIC PANEL
Anion gap: 9 (ref 5–15)
BUN: 17 mg/dL (ref 6–20)
CO2: 28 mmol/L (ref 22–32)
Calcium: 9.6 mg/dL (ref 8.9–10.3)
Chloride: 102 mmol/L (ref 98–111)
Creatinine, Ser: 0.59 mg/dL — ABNORMAL LOW (ref 0.61–1.24)
GFR calc Af Amer: >60 mL/min (ref >60)
GFR calc non Af Amer: >60 mL/min (ref >60)
Glucose, Bld: 126 mg/dL — ABNORMAL HIGH (ref 70–99)
Potassium: 4 mmol/L (ref 3.5–5.1)
Sodium: 139 mmol/L (ref 135–145)

## 2019-07-17 LAB — MAGNESIUM: Magnesium: 1.8 mg/dL (ref 1.7–2.4)

## 2019-07-17 MED ORDER — CLONAZEPAM 0.5 MG PO TBDP
1.0000 mg | ORAL_TABLET | Freq: Three times a day (TID) | ORAL | Status: DC
  Administered 2019-07-17 – 2019-07-20 (×9): 1 mg
  Filled 2019-07-17 (×9): qty 2

## 2019-07-17 MED ORDER — FUROSEMIDE 8 MG/ML PO SOLN
40.0000 mg | Freq: Every day | ORAL | Status: DC
  Filled 2019-07-17: qty 5

## 2019-07-17 MED ORDER — HYDROMORPHONE HCL 1 MG/ML IJ SOLN
1.0000 mg | INTRAMUSCULAR | Status: DC | PRN
  Administered 2019-07-18 – 2019-07-19 (×7): 1 mg via INTRAVENOUS
  Filled 2019-07-17 (×7): qty 1

## 2019-07-17 MED ORDER — SERTRALINE HCL 25 MG PO TABS
25.0000 mg | ORAL_TABLET | Freq: Every day | ORAL | Status: DC
  Administered 2019-07-18 – 2019-07-25 (×8): 25 mg
  Filled 2019-07-17 (×9): qty 1

## 2019-07-17 MED ORDER — CLONIDINE HCL 0.2 MG PO TABS
0.3000 mg | ORAL_TABLET | Freq: Four times a day (QID) | ORAL | Status: DC
  Administered 2019-07-17 – 2019-07-24 (×27): 0.3 mg
  Filled 2019-07-17 (×27): qty 1

## 2019-07-17 MED ORDER — SERTRALINE HCL 20 MG/ML PO CONC: 25.0000 mg | Freq: Every day | ORAL | Status: DC

## 2019-07-17 MED ORDER — NICOTINE 14 MG/24HR TD PT24
14.0000 mg | MEDICATED_PATCH | Freq: Every day | TRANSDERMAL | Status: DC
  Administered 2019-07-18 – 2019-08-04 (×18): 14 mg via TRANSDERMAL
  Filled 2019-07-17 (×18): qty 1

## 2019-07-17 MED ORDER — SERTRALINE HCL 20 MG/ML PO CONC
25.0000 mg | Freq: Every day | ORAL | Status: DC
  Filled 2019-07-17: qty 1.25

## 2019-07-17 MED ORDER — FOLIC ACID 1 MG PO TABS
1.0000 mg | ORAL_TABLET | Freq: Every day | ORAL | Status: DC
  Administered 2019-07-17 – 2019-07-28 (×12): 1 mg
  Filled 2019-07-17 (×12): qty 1

## 2019-07-17 NOTE — Progress Notes (Signed)
  Speech Language Pathology Treatment: Nada Boozer Speaking valve  Patient Details Name: [name]. MRN: [id number] DOB: January 28, 1961 Today's Date: 07/17/2019 Time: 1010-1043 SLP Time Calculation (min) (ACUTE ONLY): 33 min  Assessment / Plan / Recommendation Clinical Impression  Pt seen for ongoing PMV treatment.  Pt was asleep on SLP arrival but able to rouse.  Cuff deflated.  Pt has been off vent for several days now.  Pt was calm and alert throughout session, but began to become fidgety just prior to SLP departure.  RN in room with pt.    Pt tolerated valve placement for 30 minutes with no change in vital signs (SpO2 100%, HR 60-63, RR 16-17). Vocal quality initially noted to sound congested.  Pt did not follow commands for cued cough. Pt noted to cough infrequently, cough seemingly unproductive; however, vocal, quality did clear. Pt was able to achieve phonation, but was largely unintelligible.  In known context some words and short phrases could be understood.  Pt minimally responsive to SLP (did not engage in social greetings, automatic speech tasks, or respond to personal questions), but RN was able to engage in conversation and pt answered some orientation questions correctly. As tracheostomy has been changed from cuffless to CUFFED track, PMV sign was replaced, to ensure staff check for cuff deflation before placing PMV.  Pt may wear PMV with staff with direct supervision.  If pt is able to maintain calm alert state (rather than extreme agitation, or lethargy from sedation) pt may be appropriate for repeat MBSS later this week.  Most recent MBSS 1/28 showed silent aspiration prior to transfer to higher level of care, and pt will need repeat instrumental evaluation prior to initiation of PO diet.    HPI HPI: 59 year old man with a history of alcohol use disorder, tobacco use disorder, HTN, and recent admission on 04/10/2019 for pancreatitis and hyponatremia who presents for evaluation of  shortness of breath and neck swelling with loss of airway and PEA arrest requiring emergent tracheostomy. Trach changed from 4 cuffless back to [place] 6 cuffed on 1/30 after RR event, and transferred back to ICU.  Pt has been difficult to wean from sedation and intermittently agitated.      SLP Plan  Continue with current plan of care       Recommendations  Diet recommendations: NPO Medication Administration: Via alternative means      Patient may use Passy-Muir Speech Valve: Intermittently with supervision PMSV Supervision: Full MD: Please consider changing trach tube to : Smaller size;Cuffless(when medically appropriate)         Oral Care Recommendations: Oral care QID Follow up Recommendations: Skilled Nursing facility SLP Visit Diagnosis: Aphonia (R49.1) Plan: Continue with current plan of care       [place], [place], [place] Office: [phone]  07/17/2019, 11:30 AM

## 2019-07-17 NOTE — Progress Notes (Signed)
75 ml of hydromorphone wasted with Parke Simmers RN.

## 2019-07-17 NOTE — Progress Notes (Signed)
PULMONARY / CRITICAL CARE MEDICINE  NAME:  [name]., MRN:  [id number], DOB:  1961/03/23, LOS: 85 ADMISSION DATE:  06/12/2019, CONSULTATION DATE:  06/12/2019 REFERRING MD:  Alinda Sierras, CHIEF COMPLAINT:  Shortness of breath, sore throat, and neck swelling  BRIEF HISTORY:    59 yo male smoker with hx of ETOH had admission in November 2020 for pancreatitis and hyponatremia presented with dyspnea and neck swelling from cellulitis with acute airway compromise resulting in PEA requiring emergent tracheostomy.  Found to have H influenza bacteremia.  SIGNIFICANT PAST MEDICAL HISTORY   ETOH, Pneumonia, COPD, Rt lung empyema, Hep C, Pancreatitis  SIGNIFICANT EVENTS:  1/4 : Lost airway and PEA arrest on, requiring emergent tracheostomy and ROSC obtained 1/5: 4/4 Blood cultures growing H.Influenza , narrowed to Unasyn 1/5 > 1/14: Continued on antibiotics without any significant improvement  1/14: New right foot swelling for which MRI obtained due to concerns for seeding of cellulitis from neck 1/15: MRI with synovitis and subcutaneous edema suggestive of cellulitis; orthopedics consulted for right ankle aspiration - aspirate with positive crystals suggestive of gout - patient started on NSAIDs and steroids 1/16 Started on dilaudid, D/C fentanyl  1/17: Patient agitated early AM, q2h prn versed prescribed 1/18: Hb 5.2, transfused 2u pRBC; started methadone  1/19: Klonopin increased to tid and metoprolol added for SVTs 1/20: precedex gtt discontinued overnight for concerns of bradycardia  1/21: clonidine taper, 24hr trach collar 1/22: continuing trach collar 1/23: last day of unasyn for cellulitis, steroid taper and colchicine for gout, continuing trach collar  1/24: 72 hours of trach collar 1/25 changed to 4 cuffless 1/26 sedation cut back 1/30 returned to ICU for hypercarbic respiratory failure and encephalopathy 2/06: ventilator liberated. Has been TC for 72 hours.  CULTURES:  Influenza A  : Negative Influenza B : Negtive Sars Coronavirus: Negative MRSA Nasopharyngeal negative 1/4 Blood cultures: 4/4 Positive for H. Influenza  Urine culture: No growth 1/6 Blood cultures: NGTD on 1/11 1/13 Tracheal aspirate: few klebsiella pneumoniae  1/16 R ankle aspirate : monosodium urate crystals, no organisms on gram stain; culture w/ no growth to date 1/27: Sputum culture>>> klebsiella  ANTIBIOTICS:  Bactrim 1/4 Azithromycin 1/4  Vancomycin 1/4 Zosyn  1/4 one dose Clindamycin 1/4 one dose Unasyn 1/5 >> 1/23  Ceftriaxone >> 1/29 for klebsiella  LINES/TUBES:  Tracheostomy - placed 1/4 Peripheral IV in Right Forearm - placed 1/9 Peripheral IV in Left Forearm - placed 1/9 PICC line double lumen - placed 1/15  CONSULTANTS:  Infectious Disease  ENT  Orthopedic surgery  SUBJECTIVE:  Remains on precedex.  OBJECTIVE:   CONSTITUTIONAL: BP (!) 96/54 (BP Location: Left Arm)   Pulse (!) 57   Temp 98.5 F (36.9 C) (Axillary)   Resp 19   Ht 5\' 8"  (1.727 m)   Wt 69.7 kg   SpO2 100%   BMI 23.36 kg/m   I/O last 3 completed shifts: In: 3831.8 [I.V.:1181.8; NG/GT:2650] Out: 3150 [Urine:2760; Stool:390]   FiO2 (%):  [25 %-28 %] 28 %   PHYSICAL EXAM:  General - sedated Eyes - pupils reactive ENT - trach site clean Cardiac - regular rate/rhythm, no murmur Chest - equal breath sounds b/l, no wheezing or rales Abdomen - soft, non tender, + bowel sounds Extremities - no cyanosis, clubbing, or edema Skin - no rashes Neuro - RASS -1   RESOLVED PROBLEMS:  Klebsiella HCAP, Aspiration pneumonia  ASSESSMENT AND PLAN    Compromised airway 2nd to neck cellulitis with bacteremia from H  influenza. - off vent since 2/03 - trach care - goal SpO2 > 90%  Tobacco abuse with hx of COPD. - wean off nicotine patch as able  Acute metabolic encephalopathy from sepsis. Hx of ETOH, opiod dependence. - RASS goal 0  - wean off precedex as able - continue catapres, methadone,  seroquel, klonopin - add zoloft - thiamine, folic acid, MVI  Hypertension. Paroxysmal supraventricular tachycardia. - continue cozaar, lasix, lopressor  BEST PRACTICE:  DVT prophylaxis: lovenox SUP: Pepcid Nutrition: tube feeds Mobility: bed rest Goals of care: full code Disposition: ICU  LABS:   CMP Latest Ref Rng & Units 07/17/2019 07/15/2019 07/14/2019  Glucose 70 - 99 mg/dL 126(H) 123(H) 118(H)  BUN 6 - 20 mg/dL 17 13 12   Creatinine 0.61 - 1.24 mg/dL 0.59(L) 0.57(L) 0.50(L)  Sodium 135 - 145 mmol/L 139 138 138  Potassium 3.5 - 5.1 mmol/L 4.0 3.6 3.3(L)  Chloride 98 - 111 mmol/L 102 99 100  CO2 22 - 32 mmol/L 28 26 26   Calcium 8.9 - 10.3 mg/dL 9.6 9.5 9.4  Total Protein 6.5 - 8.1 g/dL - - -  Total Bilirubin 0.3 - 1.2 mg/dL - - -  Alkaline Phos 38 - 126 U/L - - -  AST 15 - 41 U/L - - -  ALT 0 - 44 U/L - - -    CBC Latest Ref Rng & Units 07/11/2019 07/10/2019 07/09/2019  WBC 4.0 - 10.5 K/uL 6.8 7.8 10.4  Hemoglobin 13.0 - 17.0 g/dL 10.2(L) 10.6(L) 11.2(L)  Hematocrit 39.0 - 52.0 % 34.5(L) 35.1(L) 37.5(L)  Platelets 150 - 400 K/uL 194 241 292    ABG    Component Value Date/Time   PHART 7.510 (H) 07/08/2019 1357   PCO2ART 37.1 07/08/2019 1357   PO2ART 307.0 (H) 07/08/2019 1357   HCO3 29.7 (H) 07/08/2019 1357   TCO2 31 07/08/2019 1357   ACIDBASEDEF 13.0 (H) 06/12/2019 1620   O2SAT 100.0 07/08/2019 1357    CBG (last 3)  Recent Labs    07/17/19 0008 07/17/19 0357 07/17/19 [place], MD [place] 07/17/2019, 9:48 AM

## 2019-07-18 ENCOUNTER — Inpatient Hospital Stay (HOSPITAL_COMMUNITY): Payer: Medicaid Other

## 2019-07-18 LAB — GLUCOSE, CAPILLARY
Glucose-Capillary: 101 mg/dL — ABNORMAL HIGH (ref 70–99)
Glucose-Capillary: 106 mg/dL — ABNORMAL HIGH (ref 70–99)
Glucose-Capillary: 108 mg/dL — ABNORMAL HIGH (ref 70–99)
Glucose-Capillary: 119 mg/dL — ABNORMAL HIGH (ref 70–99)
Glucose-Capillary: 124 mg/dL — ABNORMAL HIGH (ref 70–99)
Glucose-Capillary: 124 mg/dL — ABNORMAL HIGH (ref 70–99)

## 2019-07-18 LAB — CBC
HCT: 36.1 % — ABNORMAL LOW (ref 39.0–52.0)
Hemoglobin: 11.1 g/dL — ABNORMAL LOW (ref 13.0–17.0)
MCH: 25.4 pg — ABNORMAL LOW (ref 26.0–34.0)
MCHC: 30.7 g/dL (ref 30.0–36.0)
MCV: 82.6 fL (ref 80.0–100.0)
Platelets: 267 10*3/uL (ref 150–400)
RBC: 4.37 MIL/uL (ref 4.22–5.81)
RDW: 18.5 % — ABNORMAL HIGH (ref 11.5–15.5)
WBC: 8.2 10*3/uL (ref 4.0–10.5)
nRBC: 0 % (ref 0.0–0.2)

## 2019-07-18 LAB — COMPREHENSIVE METABOLIC PANEL
ALT: 440 U/L — ABNORMAL HIGH (ref 0–44)
AST: 484 U/L — ABNORMAL HIGH (ref 15–41)
Albumin: 2.9 g/dL — ABNORMAL LOW (ref 3.5–5.0)
Alkaline Phosphatase: 860 U/L — ABNORMAL HIGH (ref 38–126)
Anion gap: 11 (ref 5–15)
BUN: 16 mg/dL (ref 6–20)
CO2: 26 mmol/L (ref 22–32)
Calcium: 9.7 mg/dL (ref 8.9–10.3)
Chloride: 100 mmol/L (ref 98–111)
Creatinine, Ser: 0.62 mg/dL (ref 0.61–1.24)
GFR calc Af Amer: >60 mL/min (ref >60)
GFR calc non Af Amer: >60 mL/min (ref >60)
Glucose, Bld: 119 mg/dL — ABNORMAL HIGH (ref 70–99)
Potassium: 3.9 mmol/L (ref 3.5–5.1)
Sodium: 137 mmol/L (ref 135–145)
Total Bilirubin: 2.3 mg/dL — ABNORMAL HIGH (ref 0.3–1.2)
Total Protein: 7.4 g/dL (ref 6.5–8.1)

## 2019-07-18 LAB — MAGNESIUM: Magnesium: 1.7 mg/dL (ref 1.7–2.4)

## 2019-07-18 LAB — PHOSPHORUS: Phosphorus: 4.4 mg/dL (ref 2.5–4.6)

## 2019-07-18 MED ORDER — FUROSEMIDE 40 MG PO TABS
40.0000 mg | ORAL_TABLET | Freq: Every day | ORAL | Status: DC
  Administered 2019-07-18 – 2019-07-24 (×7): 40 mg
  Filled 2019-07-18 (×7): qty 1

## 2019-07-18 MED ORDER — HALOPERIDOL LACTATE 5 MG/ML IJ SOLN
5.0000 mg | Freq: Once | INTRAMUSCULAR | Status: AC
  Administered 2019-07-18: 5 mg via INTRAVENOUS
  Filled 2019-07-18: qty 1

## 2019-07-18 MED ORDER — VITAL AF 1.2 CAL PO LIQD
1000.0000 mL | ORAL | Status: DC
  Administered 2019-07-20 – 2019-07-24 (×4): 1000 mL
  Filled 2019-07-18 (×14): qty 1000

## 2019-07-18 NOTE — Evaluation (Signed)
Physical Therapy Evaluation Patient Details Name: [name]. MRN: [id number] DOB: [birth year] Today's Date: 07/18/2019   History of Present Illness  Pt is a 59 y.o. male admitted 06/12/19 for SOB and neck swelling. Admitted to ICU with loss of airway and PEA arrest requiring emergent trach; has been difficult to wean off sedation. 1/15 R foot MRI suggestive of cellulitis, s/p aspiration. 1/30 return to ICU for hypercarbic respiratory failure and encephalopathy. 2/06 on trach collar 72 hrs and vent liberated; 2/09 orders to leave ICU for progressive care  PMH includes recent admission for pancreatitis (04/10/19), ETOH and tobacco use, HTN.  Clinical Impression   Pt admitted with above diagnoses. He was previously evaluated by PT, but had medical decline requiring increased sedation. Pt recently had Precedex discontinued and RASS=1. Per RN, he continues to have periods of agitation with kicking, hitting, and biting. During evaluation, RN was present and he remained calm, appeared to enjoy sitting EOB. Was able to nearly fully stand x 3 reps.  Pt currently with functional limitations due to the deficits listed below (see PT Problem List). Pt may benefit from skilled PT to increase their independence and safety with mobility to allow discharge to the venue listed below.       Follow Up Recommendations SNF;Supervision for mobility/OOB(?LTACH (prob not an option due to insurance))    Equipment Recommendations  None recommended by PT    Recommendations for Other Services OT consult     Precautions / Restrictions Precautions Precautions: Fall Precaution Comments: trach collar, bilat mittens; posey wrist, ankle, and waist restraints Restrictions Weight Bearing Restrictions: No      Mobility  Bed Mobility Overal bed mobility: Needs Assistance Bed Mobility: Supine to Sit;Sit to Supine     Supine to sit: Min assist;HOB elevated;+2 for safety/equipment Sit to supine: +2 for physical  assistance;Mod assist   General bed mobility comments: incr time, but followed instructions for coming to sit at rt side of bed; returning to supine required facilitation and promise to put him in chair position once supine  Transfers Overall transfer level: Needs assistance Equipment used: 2 person hand held assist Transfers: Sit to/from Stand Sit to Stand: +2 physical assistance;Mod assist;+2 safety/equipment         General transfer comment: x3 without full hip/knee extension on any attempt; max time he stood was 5 seconds  Ambulation/Gait             General Gait Details: unable to advance feet to side-step to his rt (to [facility] Fall)  Stairs            Wheelchair Mobility    Modified Rankin (Stroke Patients Only)       Balance Overall balance assessment: Needs assistance Sitting-balance support: Bilateral upper extremity supported;Feet supported Sitting balance-Leahy Scale: Poor Sitting balance - Comments: minguard with occasional mod assist due to posterior lean/LOB Postural control: Posterior lean Standing balance support: Bilateral upper extremity supported Standing balance-Leahy Scale: Zero Standing balance comment: nearl full standing for up to 5 sec; x 3 reps                             Pertinent Vitals/Pain Pain Assessment: Faces Faces Pain Scale: No hurt    Home Living Family/patient expects to be discharged to:: Private residence Living Arrangements: Spouse/significant other Available Help at Discharge: Family;Available 24 hours/day Type of Home: House Home Access: Stairs to enter Entrance Stairs-Rails: Can reach both Entrance Stairs-Number of  Steps: 3 Home Layout: One level Home Equipment: Walker - 2 wheels Additional Comments: history from previous evaluation this admission    Prior Function Level of Independence: Independent         Comments: works around the house per last admission, reports able to bathe and dress himself      Hand Dominance   Dominant Hand: Right    Extremity/Trunk Assessment   Upper Extremity Assessment Upper Extremity Assessment: Defer to OT evaluation(will re-order OT)    Lower Extremity Assessment Lower Extremity Assessment: RLE deficits/detail;LLE deficits/detail RLE Deficits / Details: AROM throughout; requires +2 mod assist to stand and does not achieve full knee/hip extension (grossly 3+/5) LLE Deficits / Details: AROM throughout; requires +2 mod assist to stand and does not achieve full knee/hip extension (grossly 3+/5)    Cervical / Trunk Assessment Cervical / Trunk Assessment: Other exceptions Cervical / Trunk Exceptions: trach  Communication   Communication: Tracheostomy  Cognition Arousal/Alertness: Suspect due to medications(mildy drowsy) Behavior During Therapy: Flat affect Overall Cognitive Status: Difficult to assess Area of Impairment: Attention;Following commands;Awareness;Safety/judgement                   Current Attention Level: Sustained   Following Commands: Follows one step commands inconsistently;Follows one step commands with increased time Safety/Judgement: Decreased awareness of safety;Decreased awareness of deficits     General Comments: eyes open throughout session; able to follow multi-modal cues (including when to sit still, when to stand, not to pull on lines (mittens were not removed per RN recommendation)      General Comments General comments (skin integrity, edema, etc.): trach collar 28% with sats 97-100% throughout; all other VSS    Exercises     Assessment/Plan    PT Assessment Patient needs continued PT services  PT Problem List Decreased strength;Decreased range of motion;Decreased activity tolerance;Decreased balance;Decreased mobility;Decreased coordination;Decreased knowledge of use of DME;Decreased safety awareness;Decreased knowledge of precautions;Cardiopulmonary status limiting activity;Pain;Decreased  cognition       PT Treatment Interventions DME instruction;Gait training;Stair training;Functional mobility training;Therapeutic activities;Therapeutic exercise;Balance training;Neuromuscular re-education;Cognitive remediation;Patient/family education;Wheelchair mobility training    PT Goals (Current goals can be found in the Care Plan section)  Acute Rehab PT Goals Patient Stated Goal: unable to state PT Goal Formulation: Patient unable to participate in goal setting Time For Goal Achievement: 08/01/19 Potential to Achieve Goals: Fair    Frequency Min 2X/week   Barriers to discharge        Co-evaluation               AM-PAC PT "6 Clicks" Mobility  Outcome Measure Help needed turning from your back to your side while in a flat bed without using bedrails?: Total Help needed moving from lying on your back to sitting on the side of a flat bed without using bedrails?: Total Help needed moving to and from a bed to a chair (including a wheelchair)?: Total Help needed standing up from a chair using your arms (e.g., wheelchair or bedside chair)?: Total Help needed to walk in hospital room?: Total Help needed climbing 3-5 steps with a railing? : Total 6 Click Score: 6    End of Session Equipment Utilized During Treatment: Oxygen Activity Tolerance: Patient tolerated treatment well Patient left: with call bell/phone within reach;with restraints reapplied;in bed;with bed alarm set(chair position) Nurse Communication: Mobility status;Need for lift equipment;Other (comment)(RN assisted with entire session for safety) PT Visit Diagnosis: Muscle weakness (generalized) (M62.81);Difficulty in walking, not elsewhere classified (R26.2)    Time:  [id number] PT Time Calculation (min) (ACUTE ONLY): 19 min   Charges:   PT Evaluation $PT Eval High Complexity: 1 High           Arby Barrette, PT Pager [phone]   Rexanne Mano 07/18/2019, 10:12 AM

## 2019-07-18 NOTE — Progress Notes (Signed)
Nutrition Follow-up  DOCUMENTATION CODES:   Non-severe (moderate) malnutrition in context of chronic illness  INTERVENTION:    Increase Vital AF 1.2 goal rate to 70 ml/h to provide 2016 kcal, 126 gm protein, 1362 ml free water daily.  NUTRITION DIAGNOSIS:   Moderate Malnutrition related to chronic illness(COPD, alcoholism) as evidenced by mild fat depletion, moderate fat depletion, mild muscle depletion.  Ongoing  GOAL:   Patient will meet greater than or equal to 90% of their needs  Met with TF  MONITOR:   Diet advancement, Labs, Weight trends, TF tolerance, I & O's, Skin  ASSESSMENT:   59 yo male admitted with SOB, neck swelling, sore throat. Found to have H. flu in blood. S/P PEA arrest requiring emergent tracheostomy 1/4. PMH includes alcohol abuse, dysphagia, chronic hepatitis C, heavy smoker, COPD, poor dentition.  Patient is now on trach collar.   Cortrak in place (tip in stomach). Receiving Vital AF 1.2 at 65 ml/h, tolerating well to meet nutrition needs.  SLP following for PMV use. May be ready for repeat MBS later this week.   Labs reviewed.  CBG's: [phone]  Medications reviewed and include folic acid, lasix, novolog, miralax, thiamine. Off Precedex.    NUTRITION - FOCUSED PHYSICAL EXAM: Re-completed 2/9    Most Recent Value  Orbital Region  Moderate depletion  Upper Arm Region  Moderate depletion  Thoracic and Lumbar Region  Moderate depletion  Buccal Region  Moderate depletion  Temple Region  Moderate depletion  Clavicle Bone Region  Mild depletion  Clavicle and Acromion Bone Region  Mild depletion  Scapular Bone Region  Mild depletion  Dorsal Hand  Unable to assess  Patellar Region  Severe depletion  Anterior Thigh Region  Severe depletion  Posterior Calf Region  Moderate depletion  Edema (RD Assessment)  Mild  Hair  Reviewed  Eyes  Unable to assess  Mouth  Unable to assess  Skin  Reviewed  Nails  Reviewed       Diet Order:   Diet  Order            Diet NPO time specified  Diet effective now              EDUCATION NEEDS:   Not appropriate for education at this time  Skin:  Skin Assessment: Skin Integrity Issues: Skin Integrity Issues:: Stage III Stage III: R neck  Last BM:  2/9 rectal tube  Height:   Ht Readings from Last 1 Encounters:  06/12/19 _0  (1.727 m)    Weight:   Wt Readings from Last 1 Encounters:  07/18/19 70.5 kg    Ideal Body Weight:  70 kg  BMI:  Body mass index is 23.63 kg/m.  Estimated Nutritional Needs:   Kcal:  1900-2100  Protein:  100-115 gm  Fluid:  >/= 1.9 L    Molli Barrows, RD, LDN, CNSC Contact information can be found in Amion.

## 2019-07-18 NOTE — Progress Notes (Signed)
PULMONARY / CRITICAL CARE MEDICINE  NAME:  William Perfecto., MRN:  [id number], DOB:  12-27-1960, LOS: 60 ADMISSION DATE:  06/12/2019, CONSULTATION DATE:  06/12/2019 REFERRING MD:  Alinda Sierras, CHIEF COMPLAINT:  Shortness of breath, sore throat, and neck swelling  BRIEF HISTORY:    59 yo male smoker with hx of ETOH had admission in November 2020 for pancreatitis and hyponatremia presented with dyspnea and neck swelling from cellulitis with acute airway compromise resulting in PEA requiring emergent tracheostomy.  Found to have H influenza bacteremia.  SIGNIFICANT PAST MEDICAL HISTORY   ETOH, Pneumonia, COPD, Rt lung empyema, Hep C, Pancreatitis  SIGNIFICANT EVENTS:  1/4 : Lost airway and PEA arrest on, requiring emergent tracheostomy and ROSC obtained 1/5: 4/4 Blood cultures growing H.Influenza , narrowed to Unasyn 1/5 > 1/14: Continued on antibiotics without any significant improvement  1/14: New right foot swelling for which MRI obtained due to concerns for seeding of cellulitis from neck 1/15: MRI with synovitis and subcutaneous edema suggestive of cellulitis; orthopedics consulted for right ankle aspiration - aspirate with positive crystals suggestive of gout - patient started on NSAIDs and steroids 1/16 Started on dilaudid, D/C fentanyl  1/17: Patient agitated early AM, q2h prn versed prescribed 1/18: Hb 5.2, transfused 2u pRBC; started methadone  1/19: Klonopin increased to tid and metoprolol added for SVTs 1/20: precedex gtt discontinued overnight for concerns of bradycardia  1/21: clonidine taper, 24hr trach collar 1/22: continuing trach collar 1/23: last day of unasyn for cellulitis, steroid taper and colchicine for gout, continuing trach collar  1/24: 72 hours of trach collar 1/25 changed to 4 cuffless 1/26 sedation cut back 1/30 returned to ICU for hypercarbic respiratory failure and encephalopathy 2/06 ventilator liberated. Has been TC for 72 hours. 2/09 off precedex, transfer  to progressive care  CULTURES:  Influenza A : Negative Influenza B : Negtive Sars Coronavirus: Negative MRSA Nasopharyngeal negative 1/4 Blood cultures: 4/4 Positive for H. Influenza  Urine culture: No growth 1/6 Blood cultures: NGTD on 1/11 1/13 Tracheal aspirate: few klebsiella pneumoniae  1/16 R ankle aspirate : monosodium urate crystals, no organisms on gram stain; culture w/ no growth to date 1/27: Sputum culture>>> klebsiella  ANTIBIOTICS:  Bactrim 1/4 Azithromycin 1/4  Vancomycin 1/4 Zosyn  1/4 one dose Clindamycin 1/4 one dose Unasyn 1/5 >> 1/23  Ceftriaxone >> 1/29 for klebsiella  LINES/TUBES:  Tracheostomy - placed 1/4 Peripheral IV in Right Forearm - placed 1/9 Peripheral IV in Left Forearm - placed 1/9 PICC line double lumen - placed 1/15  CONSULTANTS:  Infectious Disease  ENT  Orthopedic surgery  SUBJECTIVE:  Off precedex.  Working with PT.  OBJECTIVE:   CONSTITUTIONAL: BP (!) 149/86   Pulse 64   Temp 98 F (36.7 C) (Axillary)   Resp (!) 23   Ht 5\' 8"  (1.727 m)   Wt 70.5 kg   SpO2 96%   BMI 23.63 kg/m   I/O last 3 completed shifts: In: 3336 [I.V.:566.9; NG/GT:2769.1] Out: 2645 [Urine:2245; Stool:400]   FiO2 (%):  [28 %] 28 %   PHYSICAL EXAM:  General - alert, sitting at side of bed Eyes - pupils reactive ENT - trach site clean Cardiac - regular rate/rhythm, no murmur Chest - equal breath sounds b/l, no wheezing or rales Abdomen - soft, non tender, + bowel sounds Extremities - no cyanosis, clubbing, or edema Skin - no rashes Neuro - follows simple commands   RESOLVED PROBLEMS:  Klebsiella HCAP, Aspiration pneumonia  ASSESSMENT AND PLAN  Compromised airway 2nd to neck cellulitis with bacteremia from H influenza. - off vent since 2/03 - trach care - f/u with speech therapy - goal SpO2 > 90%  Tobacco abuse with hx of COPD. - wean off nicotine patch slowly  Acute metabolic encephalopathy from sepsis. Hx of ETOH, opiod  dependence. - continue catapres, methadone, seroquel, klonopin, zoloft - prn IV dilaudid - continue thiamine, folic acid  Hypertension. Paroxysmal supraventricular tachycardia. - continue cozaar, lasix, lopressor  Elevated LFTs. - f/u CMET  BEST PRACTICE:  DVT prophylaxis: lovenox SUP: Pepcid Nutrition: tube feeds Mobility: bed rest Goals of care: full code Disposition: transfer to progressive care 2/09.  Will ask [facility] to assume primary care from 2/10 and PCCM follow for trach management.  LABS:   CMP Latest Ref Rng & Units 07/18/2019 07/17/2019 07/15/2019  Glucose 70 - 99 mg/dL 119(H) 126(H) 123(H)  BUN 6 - 20 mg/dL 16 17 13   Creatinine 0.61 - 1.24 mg/dL 0.62 0.59(L) 0.57(L)  Sodium 135 - 145 mmol/L 137 139 138  Potassium 3.5 - 5.1 mmol/L 3.9 4.0 3.6  Chloride 98 - 111 mmol/L 100 102 99  CO2 22 - 32 mmol/L 26 28 26   Calcium 8.9 - 10.3 mg/dL 9.7 9.6 9.5  Total Protein 6.5 - 8.1 g/dL 7.4 - -  Total Bilirubin 0.3 - 1.2 mg/dL 2.3(H) - -  Alkaline Phos 38 - 126 U/L 860(H) - -  AST 15 - 41 U/L 484(H) - -  ALT 0 - 44 U/L 440(H) - -    CBC Latest Ref Rng & Units 07/18/2019 07/11/2019 07/10/2019  WBC 4.0 - 10.5 K/uL 8.2 6.8 7.8  Hemoglobin 13.0 - 17.0 g/dL 11.1(L) 10.2(L) 10.6(L)  Hematocrit 39.0 - 52.0 % 36.1(L) 34.5(L) 35.1(L)  Platelets 150 - 400 K/uL 267 194 241    ABG    Component Value Date/Time   PHART 7.510 (H) 07/08/2019 1357   PCO2ART 37.1 07/08/2019 1357   PO2ART 307.0 (H) 07/08/2019 1357   HCO3 29.7 (H) 07/08/2019 1357   TCO2 31 07/08/2019 1357   ACIDBASEDEF 13.0 (H) 06/12/2019 1620   O2SAT 100.0 07/08/2019 1357    CBG (last 3)  Recent Labs    07/17/19 2320 07/18/19 0316 07/18/19 0731  GLUCAP 94 108* 124*    Chesley Mires, MD [place] 07/18/2019, 9:00 AM

## 2019-07-19 LAB — COMPREHENSIVE METABOLIC PANEL
ALT: 370 U/L — ABNORMAL HIGH (ref 0–44)
AST: 204 U/L — ABNORMAL HIGH (ref 15–41)
Albumin: 3 g/dL — ABNORMAL LOW (ref 3.5–5.0)
Alkaline Phosphatase: 660 U/L — ABNORMAL HIGH (ref 38–126)
Anion gap: 12 (ref 5–15)
BUN: 16 mg/dL (ref 6–20)
CO2: 25 mmol/L (ref 22–32)
Calcium: 9.9 mg/dL (ref 8.9–10.3)
Chloride: 102 mmol/L (ref 98–111)
Creatinine, Ser: 0.72 mg/dL (ref 0.61–1.24)
GFR calc Af Amer: >60 mL/min (ref >60)
GFR calc non Af Amer: >60 mL/min (ref >60)
Glucose, Bld: 110 mg/dL — ABNORMAL HIGH (ref 70–99)
Potassium: 4 mmol/L (ref 3.5–5.1)
Sodium: 139 mmol/L (ref 135–145)
Total Bilirubin: 1 mg/dL (ref 0.3–1.2)
Total Protein: 7.4 g/dL (ref 6.5–8.1)

## 2019-07-19 LAB — GLUCOSE, CAPILLARY
Glucose-Capillary: 103 mg/dL — ABNORMAL HIGH (ref 70–99)
Glucose-Capillary: 129 mg/dL — ABNORMAL HIGH (ref 70–99)
Glucose-Capillary: 119 mg/dL — ABNORMAL HIGH (ref 70–99)
Glucose-Capillary: 112 mg/dL — ABNORMAL HIGH (ref 70–99)

## 2019-07-19 MED ORDER — HALOPERIDOL LACTATE 5 MG/ML IJ SOLN
5.0000 mg | Freq: Four times a day (QID) | INTRAMUSCULAR | Status: DC | PRN
  Administered 2019-07-19 – 2019-07-21 (×4): 5 mg via INTRAVENOUS
  Filled 2019-07-19 (×4): qty 1

## 2019-07-19 NOTE — Progress Notes (Signed)
PROGRESS NOTE  [name].  TGP:[id number] DOB: 1961-06-06 DOA: 06/12/2019 PCP: Alvester Chou, NP   Brief Narrative: William Schnepf. is a 59 y.o. male with a history of alcohol abuse, hyponatremia, and admission for acute pancreatitis in November 2020 who presented 06/12/2019 with neck swelling found to have cellulitis with acute airway compromise resulting in PEA arrest requiring emergent tracheostomy.  Significant events: 1/4 : Lost airway and PEA arrest on, requiring emergent tracheostomy and ROSC obtained 1/5: 4/4 Blood cultures growing H.Influenza , narrowed to Unasyn 1/5 > 1/14: Continued on antibiotics without any significant improvement  1/14: New right foot swelling for which MRI obtained due to concerns for seeding of cellulitis from neck 1/15: MRI with synovitis and subcutaneous edema suggestive of cellulitis; orthopedics consulted for right ankle aspiration - aspirate with positive crystals suggestive of gout - patient started on NSAIDs and steroids 1/16 Started on dilaudid, D/C fentanyl  1/17: Patient agitated early AM, q2h prn versed prescribed 1/18: Hb 5.2, transfused 2u pRBC; started methadone  1/19: Klonopin increased to tid and metoprolol added for SVTs 1/20: precedex gtt discontinued overnight for concerns of bradycardia  1/21: clonidine taper, 24hr trach collar 1/22: continuing trach collar 1/23: last day of unasyn for cellulitis, steroid taper and colchicine for gout, continuing trach collar  1/24: 72 hours of trach collar 1/25 changed to 4 cuffless 1/26 sedation cut back 1/30 returned to ICU for hypercarbic respiratory failure and encephalopathy 2/06 ventilator liberated. Has been TC for 72 hours. 2/09 off precedex, transfer to progressive care with Hospitalists to assume care 2/10.  Assessment & Plan: Principal Problem:   Neck infection Active Problems:   Alcohol abuse   Poor dentition   Septic shock (HCC)   Laryngeal edema   Multifocal pneumonia  Airway compromise   Chronic hepatitis C without hepatic coma (HCC)   Acute respiratory failure with hypoxemia (HCC)   Cardiac arrest, cause unspecified (HCC)   Haemophilis influenzae bacteremia    Malnutrition of moderate degree   Atelectasis   Tracheostomy care ([place])   Acute metabolic encephalopathy   Hypercarbia   Tracheostomy status (HCC)   Pressure injury of skin  Compromised airway due to neck cellulitis with bacteremia from H influenza s/p tracheostomy 1/4. - PCCM to continue tracheostomy care  Acute hypoxemic and hypercarbic respiratory failure:  - Wean O2 as tolerated.   Tobacco abuse, history of COPD. - wean off nicotine patch slowly  Acute metabolic encephalopathy from sepsis. Hx of ETOH, opiod dependence. - continue catapres, methadone, seroquel, klonopin, zoloft - prn IV dilaudid - continue thiamine, folic acid  PSVT:  - Continue beta blocker, optimize electrolytes.   HTN:  - Continue cozaar, lasix, lopressor  Elevated LFTs: Unclear etiology and course/duration.  - Trend CMP, consider U/S vs. other work up depending on results.   RN Pressure Injury Documentation: Pressure Injury 07/11/19 Neck Right Stage 3 -  Full thickness tissue loss. Subcutaneous fat may be visible but bone, tendon or muscle are NOT exposed. small wound adjacent to trach with purulent drainage (Active)  07/11/19 1630  Location: Neck  Location Orientation: Right  Staging: Stage 3 -  Full thickness tissue loss. Subcutaneous fat may be visible but bone, tendon or muscle are NOT exposed.  Wound Description (Comments): small wound adjacent to trach with purulent drainage  Present on Admission: No   DVT prophylaxis: Lovenox Code Status: Full Family Communication: None at bedside Disposition Plan: SNF vs. LTACH  Consultants:   PCCM primary through 07/18/2019  Infectious disease  ENT  Orthopedic surgery  Procedures:  Tracheostomy - placed 1/4 Peripheral IV in Right Forearm -  placed 1/9 Peripheral IV in Left Forearm - placed 1/9 PICC line double lumen - placed 1/15  Antimicrobials: Bactrim 1/4 Azithromycin 1/4  Vancomycin 1/4 Zosyn  1/4 one dose Clindamycin 1/4 one dose Unasyn 1/5 >> 1/23  Ceftriaxone >> 1/29 for klebsiella  Subjective: Having continued intermittent agitation improved with prn haldol, since coming off precedex gtt.  Objective: Vitals:   07/19/19 0832 07/19/19 1159 07/19/19 1244 07/19/19 1500  BP: 120/69 (!) 88/51 (!) 99/54 (!) 95/55  Pulse: 73 63 67 (!) 59  Resp: 17 13 (!) 21 18  Temp:  98.9 F (37.2 C)    TempSrc:  Axillary    SpO2: 95% 99% 95%   Weight:      Height:        Intake/Output Summary (Last 24 hours) at 07/19/2019 1615 Last data filed at 07/19/2019 1300 Gross per 24 hour  Intake 1225 ml  Output 610 ml  Net 615 ml   Filed Weights   07/13/19 0239 07/14/19 0500 07/18/19 0427  Weight: 68.8 kg 69.7 kg 70.5 kg    Gen: Ill-appearing 59yo M in no distress Neck: Trach site with some yellow secretions, no bleeding, trach tilted leftward. Pulm: Non-labored breathing, 99% on 5L O2 through trach. . Clear to auscultation bilaterally.  CV: Regular rate and rhythm. No murmur, rub, or gallop. No JVD, no pedal edema. GI: Abdomen soft, non-tender, non-distended, with normoactive bowel sounds. No organomegaly or masses felt. Ext: Warm, no deformities Skin: Some warmth to left neck area, no other ulcers on limited exam today Neuro: Alert, follows commands  Data Reviewed: I have personally reviewed following labs and imaging studies  CBC: Recent Labs  Lab 07/18/19 0420  WBC 8.2  HGB 11.1*  HCT 36.1*  MCV 82.6  PLT 093   Basic Metabolic Panel: Recent Labs  Lab 07/14/19 0605 07/15/19 0416 07/17/19 0723 07/18/19 0420 07/19/19 1040  NA 138 138 139 137 139  K 3.3* 3.6 4.0 3.9 4.0  CL 100 99 102 100 102  CO2 26 26 28 26 25   GLUCOSE 118* 123* 126* 119* 110*  BUN 12 13 17 16 16   CREATININE 0.50* 0.57* 0.59* 0.62  0.72  CALCIUM 9.4 9.5 9.6 9.7 9.9  MG 1.4* 1.8 1.8 1.7  --   PHOS  --   --   --  4.4  --    GFR: Estimated Creatinine Clearance: 98.6 mL/min (by C-G formula based on SCr of 0.72 mg/dL). Liver Function Tests: Recent Labs  Lab 07/18/19 0420 07/19/19 1040  AST 484* 204*  ALT 440* 370*  ALKPHOS 860* 660*  BILITOT 2.3* 1.0  PROT 7.4 7.4  ALBUMIN 2.9* 3.0*   No results for input(s): LIPASE, AMYLASE in the last 168 hours. No results for input(s): AMMONIA in the last 168 hours. Coagulation Profile: No results for input(s): INR, PROTIME in the last 168 hours. Cardiac Enzymes: No results for input(s): CKTOTAL, CKMB, CKMBINDEX, TROPONINI in the last 168 hours. BNP (last 3 results) No results for input(s): PROBNP in the last 8760 hours. HbA1C: No results for input(s): HGBA1C in the last 72 hours. CBG: Recent Labs  Lab 07/18/19 1641 07/18/19 1922 07/18/19 2350 07/19/19 0855 07/19/19 1134  GLUCAP 101* 106* 119* 103* 129*   Lipid Profile: No results for input(s): CHOL, HDL, LDLCALC, TRIG, CHOLHDL, LDLDIRECT in the last 72 hours. Thyroid Function Tests: No results  for input(s): TSH, T4TOTAL, FREET4, T3FREE, THYROIDAB in the last 72 hours. Anemia Panel: No results for input(s): VITAMINB12, FOLATE, FERRITIN, TIBC, IRON, RETICCTPCT in the last 72 hours. Urine analysis:    Component Value Date/Time   COLORURINE YELLOW 06/12/2019 1230   APPEARANCEUR CLEAR 06/12/2019 1230   LABSPEC 1.042 (H) 06/12/2019 1230   PHURINE 5.0 06/12/2019 1230   GLUCOSEU NEGATIVE 06/12/2019 1230   HGBUR SMALL (A) 06/12/2019 1230   BILIRUBINUR NEGATIVE 06/12/2019 1230   KETONESUR NEGATIVE 06/12/2019 1230   PROTEINUR 30 (A) 06/12/2019 1230   UROBILINOGEN 0.2 07/02/2012 1607   NITRITE NEGATIVE 06/12/2019 1230   LEUKOCYTESUR NEGATIVE 06/12/2019 1230   No results found for this or any previous visit (from the past 240 hour(s)).    Radiology Studies: DG Chest Port 1 View  Result Date:  07/18/2019 CLINICAL DATA:  Shortness of breath, chest pain, respiratory failure EXAM: PORTABLE CHEST 1 VIEW COMPARISON:  Radiograph 07/10/2019, CT 06/19/2019 FINDINGS: Tracheostomy apparatus terminates at the level of the mid trachea. A transesophageal feeding tube tip courses beyond the GE junction terminating below the level of imaging. Right upper extremity PICC tip terminates in the lower SVC. Telemetry leads overlie the chest. Increased mixed interstitial and patchy opacities throughout both lungs. Suspect left pleural effusion. No visible right effusion the portion of the costophrenic sulcus is collimated from view. No pneumothorax is evident. Cardiomegaly is similar to slightly increased from prior. Calcified aorta. Vascular calcium noted at the base of the neck as well. Chronic rib deformities are similar to prior. No acute osseous or soft tissue abnormality. CORONARY ARTERIES: 1. Increased mixed interstitial and patchy opacities throughout both lungs worrisome for developing edema or worsening airspace disease. 2. Suspect left pleural effusion. 3. Cardiomegaly similar to slightly increased from prior. 4. Support devices as above. 5. Chronic rib deformities. Electronically Signed   By: Lovena Le M.D.   On: 07/18/2019 05:53    Scheduled Meds: . bacitracin  1 application Topical BID  . bethanechol  5 mg Per Tube TID  . chlorhexidine gluconate (MEDLINE KIT)  15 mL Mouth Rinse BID  . Chlorhexidine Gluconate Cloth  6 each Topical Daily  . clonazepam  1 mg Per Tube Q8H  . cloNIDine  0.3 mg Per Tube Q6H  . famotidine  20 mg Per Tube BID  . folic acid  1 mg Per Tube Daily  . furosemide  40 mg Per Tube Daily  . guaiFENesin  15 mL Per Tube Q6H  . heparin injection (subcutaneous)  5,000 Units Subcutaneous Q8H  . insulin aspart  0-15 Units Subcutaneous Q4H  . losartan  25 mg Per Tube Daily  . mouth rinse  15 mL Mouth Rinse 10 times per day  . methadone  10 mg Per Tube Q8H  . metoprolol tartrate  75  mg Per Tube BID  . nicotine  14 mg Transdermal Daily  . polyethylene glycol  17 g Per Tube Daily  . QUEtiapine  200 mg Per Tube BID  . sertraline  25 mg Per Tube Daily  . sodium chloride flush  10-40 mL Intracatheter Q12H  . thiamine  100 mg Per Tube Daily   Continuous Infusions: . sodium chloride Stopped (07/17/19 2053)  . feeding supplement (VITAL AF 1.2 CAL) 70 mL/hr at 07/18/19 1930     LOS: 37 days   Time spent: 35 minutes.  Patrecia Pour, MD [facility] [email or web address] 07/19/2019, 4:15 PM

## 2019-07-19 NOTE — Progress Notes (Signed)
CSW following for discharge needs. Current SNF barriers include Medicaid, Trach, Cortrak, sitter, and Methadone.   William Locus Hedi Barkan LCSW [phone]

## 2019-07-19 NOTE — Progress Notes (Signed)
  Speech Language Pathology Treatment: Dysphagia;Passy Muir Speaking valve  Patient Details Name: [name]. MRN: [id number] DOB: [birth year] Today's Date: 07/19/2019 Time: 0831-0900 SLP Time Calculation (min) (ACUTE ONLY): 29 min  Assessment / Plan / Recommendation Clinical Impression  Pt was seen for skilled ST targeting aphonia secondary to trach and dysphagia.  Per nursing, pt has been combative and confused this morning, and he was encountered awake/alert with bilateral mitts and restraints in place.  Cortrak was also in place to meet pt's nutritional needs secondary to current NPO status.  Trach cuff was deflated upon SLP arrival, and trach appeared to be positioned/leaning approximately an inch to the left rather than at midline.  RN was made aware.  Pt was able to talk around his trach with decreased vocal intensity without the use of a PMV.  PMV was donned and vocal intensity increased slightly.  Pt had wet vocal quality at the onset of this session and he was unable to clear it with spontaneous or cued coughs despite effort.  Pt tolerated the PMV for 25 minutes without a significant change in vitals or apparent respiratory distress.  O2 ranged from 94-97, RR from 20-23, and HR from 72-75.  Speech intelligibility was decreased beginning at the word level and decreasing as utterances increased in length.  Pt stated his first and last name, but he was not oriented to place, situation, or year despite moderate cues.  Recommend that pt continue to use PMV intermittently with staff given full supervision.    RN stated that she had performed oral care just prior to this tx session, therefore SLP did not complete.  Pt consumed one trial of an ice chip with PMV in place.  He exhibited decreased mastication and lingual manipulation.  No swallow initiation was observed despite verbal cues and bolus was eventually suctioned from the pt's oral cavity secondary to aspiration concerns.  Due to  confusion and combativeness, pt is not appropriate for a repeat MBS on this date; however, SLP will continue to f/u to determine readiness.  Recommend continuation of NPO with short-term alternative means of nutrition until instrumental swallow study can be completed.     HPI HPI: 59 year old man with a history of alcohol use disorder, tobacco use disorder, HTN, and recent admission on 04/10/2019 for pancreatitis and hyponatremia who presents for evaluation of shortness of breath and neck swelling with loss of airway and PEA arrest requiring emergent tracheostomy. Trach changed from 4 cuffless back to Shiley 6 cuffed on 1/30 after RR event, and transferred back to ICU.  Pt has been difficult to wean from sedation and intermittently agitated.      SLP Plan  Continue with current plan of care       Recommendations  Diet recommendations: NPO Medication Administration: Via alternative means      Patient may use Passy-Muir Speech Valve: Intermittently with supervision PMSV Supervision: Full MD: Please consider changing trach tube to : Smaller size;Cuffless(when medically appropriate )         Oral Care Recommendations: Oral care QID;Staff/trained caregiver to provide oral care Follow up Recommendations: Skilled Nursing facility SLP Visit Diagnosis: Aphonia (R49.1);Dysphagia, oropharyngeal phase (R13.12) Plan: Continue with current plan of care       [place] M.S., [place] Office: [phone]  [place] 07/19/2019, 9:07 AM

## 2019-07-19 NOTE — Progress Notes (Signed)
Pt having increased agitation. Sitter is pulled and nurse has to be at bedside. Pt squirms his way down to bottom of the bed. Asked for something to help him relax. Waiting on orders.

## 2019-07-19 NOTE — Progress Notes (Signed)
Spent some time with [name] this afternoon. Trach care done, inner cannula changed, flange cleaned. Has crusty drainage beneath flange that look like they could be scaps - these were left alone so as not to do a blind cleaning. Appears that patient is able to cough mucus out of his trach, has thick tan secretions. On 28% ATC oxygen sats 97%, when ATC off for trach care patient maintained sats at 97-100%. Spoke with nurse about weaning down oxygen. If patient requires humidification via ATC but no oxygen could be changed to humidified medical air.  He is able to cough mucus out of his trach.  #6 cuffed Shiley trach in place cuff is deflated.  Stage 2 pressure injury under trach flange at approximately 5 o'clock position. Mepilex pressure dressing changed and applied under flange to protect skin from further injury.

## 2019-07-19 NOTE — Evaluation (Signed)
Occupational Therapy Evaluation Patient Details Name: [name]. MRN: [id number] DOB: [birth year] Today's Date: 07/19/2019    History of Present Illness Pt is a 59 y.o. male admitted 06/12/19 for SOB and neck swelling. Admitted to ICU with loss of airway and PEA arrest requiring emergent trach; has been difficult to wean off sedation. 1/15 R foot MRI suggestive of cellulitis, s/p aspiration. 1/30 return to ICU for hypercarbic respiratory failure and encephalopathy. 2/06 on trach collar 72 hrs and vent liberated; 2/09 orders to leave ICU for progressive care  PMH includes recent admission for pancreatitis (04/10/19), ETOH and tobacco use, HTN.   Clinical Impression   Patient admitted for above and known by evaluating therapist, recent medical decline requiring increased sedation. Patient seen for bed level eval for safety, pt remains confused, following simple commands with increased time and requires redirection to tasks to avoid pulling lines.  Patient repositioned in bed with +2 total assist, seen in bed in chair position. Requires max-total assist for all self care at this time.  Further mobility declined.  Will follow acutely and recommend SNF level rehab at dc to optimize independence and safety with ADLs, mobility.     Follow Up Recommendations  SNF;Supervision/Assistance - 24 hour    Equipment Recommendations  Other (comment)(TBD at next venue of care )    Recommendations for Other Services       Precautions / Restrictions Precautions Precautions: Fall Precaution Comments: trach collar, bilat mittens; posey wrist, ankle, and waist restraints Restrictions Weight Bearing Restrictions: No      Mobility Bed Mobility Overal bed mobility: Needs Assistance             General bed mobility comments: +2 total assist to reposition in bed  Transfers                 General transfer comment: deferred due to 1+ assist only     Balance Overall balance assessment:  Needs assistance   Sitting balance-Leahy Scale: Poor Sitting balance - Comments: R lateral lean supported sitting in bed chair position                                   ADL either performed or assessed with clinical judgement   ADL Overall ADL's : Needs assistance/impaired Eating/Feeding: NPO   Grooming: Maximal assistance;Bed level   Upper Body Bathing: Maximal assistance;Bed level   Lower Body Bathing: Total assistance;Bed level   Upper Body Dressing : Maximal assistance;Bed level   Lower Body Dressing: Total assistance;Bed level     Toilet Transfer Details (indicate cue type and reason): deferred          Functional mobility during ADLs: (bed level eval) General ADL Comments: max-total assist for all self care at this time      Vision   Additional Comments: able to scan and locate therapist around room      Perception     Praxis      Pertinent Vitals/Pain Pain Assessment: Faces Faces Pain Scale: No hurt     Hand Dominance Right   Extremity/Trunk Assessment Upper Extremity Assessment Upper Extremity Assessment: Generalized weakness   Lower Extremity Assessment Lower Extremity Assessment: Defer to PT evaluation       Communication Communication Communication: Tracheostomy   Cognition Arousal/Alertness: Awake/alert Behavior During Therapy: Flat affect Overall Cognitive Status: Difficult to assess Area of Impairment: Attention;Following commands;Awareness;Safety/judgement  Current Attention Level: Focused   Following Commands: Follows one step commands with increased time Safety/Judgement: Decreased awareness of safety;Decreased awareness of deficits Awareness: Intellectual   General Comments: patient following 1 step commands with increased time, difficulty following multiple step commands and required redirection to avoid pulling lines    General Comments  VSS during session, trach collar 28% 5L      Exercises     Shoulder Instructions      Home Living Family/patient expects to be discharged to:: Private residence Living Arrangements: Spouse/significant other Available Help at Discharge: Family;Available 24 hours/day Type of Home: House Home Access: Stairs to enter [employer] of Steps: 3 Entrance Stairs-Rails: Can reach both Home Layout: One level     Bathroom Shower/Tub: Occupational psychologist: Standard     Home Equipment: Environmental consultant - 2 wheels   Additional Comments: history from previous evaluation this admission      Prior Functioning/Environment Level of Independence: Independent        Comments: works around the house per last admission, reports able to bathe and dress himself        OT Problem List: Decreased strength;Decreased activity tolerance;Impaired balance (sitting and/or standing);Decreased coordination;Decreased cognition;Decreased safety awareness;Decreased knowledge of use of DME or AE;Decreased knowledge of precautions;Cardiopulmonary status limiting activity;Pain;Impaired UE functional use      OT Treatment/Interventions: Self-care/ADL training;Therapeutic exercise;DME and/or AE instruction;Therapeutic activities;Cognitive remediation/compensation;Patient/family education;Balance training    OT Goals(Current goals can be found in the care plan section) Acute Rehab OT Goals Patient Stated Goal: unable to state OT Goal Formulation: Patient unable to participate in goal setting  OT Frequency: Min 2X/week   Barriers to D/C:            Co-evaluation              AM-PAC OT "6 Clicks" Daily Activity     Outcome Measure Help from another person eating meals?: Total Help from another person taking care of personal grooming?: A Lot Help from another person toileting, which includes using toliet, bedpan, or urinal?: Total Help from another person bathing (including washing, rinsing, drying)?: Total Help from another person  to put on and taking off regular upper body clothing?: Total Help from another person to put on and taking off regular lower body clothing?: Total 6 Click Score: 7   End of Session Equipment Utilized During Treatment: Oxygen(via trach) Nurse Communication: Mobility status  Activity Tolerance: Patient tolerated treatment well Patient left: with call bell/phone within reach;with bed alarm set;in bed;with restraints reapplied;with nursing/sitter in room  OT Visit Diagnosis: Other abnormalities of gait and mobility (R26.89);Muscle weakness (generalized) (M62.81);Other symptoms and signs involving cognitive function                Time: 0938-1000 OT Time Calculation (min): 22 min Charges:  OT General Charges $OT Visit: 1 Visit OT Evaluation $OT Eval Moderate Complexity: 1 Mod  Jolaine Artist, OT Acute Rehabilitation Services Pager [phone] Office [phone]    [name] 07/19/2019, 12:51 PM

## 2019-07-20 ENCOUNTER — Inpatient Hospital Stay (HOSPITAL_COMMUNITY): Payer: Medicaid Other

## 2019-07-20 LAB — CBC
HCT: 37.2 % — ABNORMAL LOW (ref 39.0–52.0)
Hemoglobin: 11 g/dL — ABNORMAL LOW (ref 13.0–17.0)
MCH: 25.4 pg — ABNORMAL LOW (ref 26.0–34.0)
MCHC: 29.6 g/dL — ABNORMAL LOW (ref 30.0–36.0)
MCV: 85.9 fL (ref 80.0–100.0)
Platelets: 273 10*3/uL (ref 150–400)
RBC: 4.33 MIL/uL (ref 4.22–5.81)
RDW: 19.1 % — ABNORMAL HIGH (ref 11.5–15.5)
WBC: 10.2 10*3/uL (ref 4.0–10.5)
nRBC: 0 % (ref 0.0–0.2)

## 2019-07-20 LAB — COMPREHENSIVE METABOLIC PANEL
ALT: 273 U/L — ABNORMAL HIGH (ref 0–44)
AST: 104 U/L — ABNORMAL HIGH (ref 15–41)
Albumin: 2.9 g/dL — ABNORMAL LOW (ref 3.5–5.0)
Alkaline Phosphatase: 529 U/L — ABNORMAL HIGH (ref 38–126)
Anion gap: 13 (ref 5–15)
BUN: 22 mg/dL — ABNORMAL HIGH (ref 6–20)
CO2: 27 mmol/L (ref 22–32)
Calcium: 9.9 mg/dL (ref 8.9–10.3)
Chloride: 99 mmol/L (ref 98–111)
Creatinine, Ser: 0.72 mg/dL (ref 0.61–1.24)
GFR calc Af Amer: >60 mL/min (ref >60)
GFR calc non Af Amer: >60 mL/min (ref >60)
Glucose, Bld: 118 mg/dL — ABNORMAL HIGH (ref 70–99)
Potassium: 4.4 mmol/L (ref 3.5–5.1)
Sodium: 139 mmol/L (ref 135–145)
Total Bilirubin: 0.7 mg/dL (ref 0.3–1.2)
Total Protein: 7.1 g/dL (ref 6.5–8.1)

## 2019-07-20 LAB — GLUCOSE, CAPILLARY
Glucose-Capillary: 110 mg/dL — ABNORMAL HIGH (ref 70–99)
Glucose-Capillary: 111 mg/dL — ABNORMAL HIGH (ref 70–99)
Glucose-Capillary: 112 mg/dL — ABNORMAL HIGH (ref 70–99)
Glucose-Capillary: 125 mg/dL — ABNORMAL HIGH (ref 70–99)
Glucose-Capillary: 126 mg/dL — ABNORMAL HIGH (ref 70–99)
Glucose-Capillary: 88 mg/dL (ref 70–99)
Glucose-Capillary: 99 mg/dL (ref 70–99)

## 2019-07-20 MED ORDER — CLONAZEPAM 0.5 MG PO TBDP
0.5000 mg | ORAL_TABLET | Freq: Three times a day (TID) | ORAL | Status: DC
  Administered 2019-07-20 – 2019-07-21 (×3): 0.5 mg
  Filled 2019-07-20 (×3): qty 1

## 2019-07-20 MED ORDER — METHADONE HCL 5 MG PO TABS
7.5000 mg | ORAL_TABLET | Freq: Three times a day (TID) | ORAL | Status: DC
  Administered 2019-07-20 – 2019-07-21 (×3): 7.5 mg
  Filled 2019-07-20 (×3): qty 2

## 2019-07-20 NOTE — Progress Notes (Signed)
  Speech Language Pathology Treatment: Dysphagia;Passy Muir Speaking valve  Patient Details Name: [name]. MRN: [id number] DOB: 07/19/1960 Today's Date: 07/20/2019 Time: [id number] SLP Time Calculation (min) (ACUTE ONLY): 24 min  Assessment / Plan / Recommendation Clinical Impression  [name] was easily awoken, interactive and conversive once speaking valve placed. Able to expectorate secretions from trach with strong reflexive cough. Intensity of voice is low and movement of lips initially was diminished. Improving with increased opportunities during session. Vitals signs remained appropriate. Oral care performed followed by trials of applesauce propelled in timely manner; tsp sips thin water resulting in immediate and delayed cough. He is ready for repeat MBS (initial performed 1/28). Scheduled for 10:00 this morning.    HPI HPI: 59 year old man with a history of alcohol use disorder, tobacco use disorder, HTN, and recent admission on 04/10/2019 for pancreatitis and hyponatremia who presents for evaluation of shortness of breath and neck swelling with loss of airway and PEA arrest requiring emergent tracheostomy. Trach changed from 4 cuffless back to Shiley 6 cuffed on 1/30 after RR event, and transferred back to ICU.  Pt has been difficult to wean from sedation and intermittently agitated.      SLP Plan  MBS       Recommendations  Diet recommendations: NPO Medication Administration: Via alternative means      Patient may use Passy-Muir Speech Valve: Intermittently with supervision PMSV Supervision: Full MD: Please consider changing trach tube to : Smaller size;Cuffless         Oral Care Recommendations: Oral care QID Follow up Recommendations: Skilled Nursing facility SLP Visit Diagnosis: Aphonia (R49.1);Dysphagia, oropharyngeal phase (R13.12) Plan: MBS                       [place] Siren 07/20/2019, 9:31 AM  Orbie Pyo Colvin Caroli.Ed  Risk analyst [phone] Office [phone]

## 2019-07-20 NOTE — Progress Notes (Signed)
Modified Barium Swallow Progress Note  Patient Details  Name: [name]. MRN: [id number] Date of Birth: September 19, 1960  Today's Date: 07/20/2019  Modified Barium Swallow completed.  Full report located under Chart Review in the Imaging Section.  Brief recommendations include the following:  Clinical Impression  PMV donned at onset of MBS. Unfortunately pt did not have significant improvements from initial MBS 07/06/19. He continues to demonstrate laryngeal penetration, pharyngeal residue. Hyoid extends forward during swallow but elevation is depressed therefore epiglottis hits the pharynx but does inverts minimally. Puree on back of epiglottis fell into airway and was mostly ejected during swallow; honey thick was penetrated close to vocal cords during swallow to clear residue. Aspiration present and brief with thin. Reduced epiglottic inversion also led to maximum residue post swallow with puree and moderate with honey. He was not appropriate for compensatory techniques to mitigate impairments. Recommend he continue npo status- SLP will continue to work with pt- he is just starting to have increased periods of wakefullness and periods of somewhat clearer cognition. Hopefully can avoid longer term alternate means of nutrition but uncertain of this.      Swallow Evaluation Recommendations       SLP Diet Recommendations: NPO       Medication Administration: Via alternative means               Oral Care Recommendations: Oral care QID        Houston Siren 07/20/2019,1:28 PM   [name].Ed Risk analyst [phone] Office [phone]

## 2019-07-20 NOTE — Progress Notes (Signed)
Patient suctioned for thick tan secretions.

## 2019-07-20 NOTE — Progress Notes (Signed)
Pt is much calmer than previous night. Haldol x1 worked to help him sleep. He slept all night with sitter at bedside.

## 2019-07-20 NOTE — Progress Notes (Signed)
Two #4 cuffless trachs ordered by unit Network engineer.

## 2019-07-20 NOTE — Progress Notes (Signed)
PROGRESS NOTE  [name].  [id number] DOB: February 27, 1961 DOA: 06/12/2019 PCP: Alvester Chou, NP   Brief Narrative: [name]. is a 59 y.o. male with a history of alcohol abuse, hyponatremia, and admission for acute pancreatitis in November 2020 who presented 06/12/2019 with neck swelling found to have cellulitis with acute airway compromise resulting in PEA arrest requiring emergent tracheostomy.  Significant events: 1/4 : Lost airway and PEA arrest on, requiring emergent tracheostomy and ROSC obtained 1/5: 4/4 Blood cultures growing H.Influenza , narrowed to Unasyn 1/5 > 1/14: Continued on antibiotics without any significant improvement  1/14: New right foot swelling for which MRI obtained due to concerns for seeding of cellulitis from neck 1/15: MRI with synovitis and subcutaneous edema suggestive of cellulitis; orthopedics consulted for right ankle aspiration - aspirate with positive crystals suggestive of gout - patient started on NSAIDs and steroids 1/16 Started on dilaudid, D/C fentanyl  1/17: Patient agitated early AM, q2h prn versed prescribed 1/18: Hb 5.2, transfused 2u pRBC; started methadone  1/19: Klonopin increased to tid and metoprolol added for SVTs 1/20: precedex gtt discontinued overnight for concerns of bradycardia  1/21: clonidine taper, 24hr trach collar 1/22: continuing trach collar 1/23: last day of unasyn for cellulitis, steroid taper and colchicine for gout, continuing trach collar  1/24: 72 hours of trach collar 1/25 changed to 4 cuffless 1/26 sedation cut back 1/30 returned to ICU for hypercarbic respiratory failure and encephalopathy 2/06 ventilator liberated. Has been TC for 72 hours. 2/09 off precedex, transfer to progressive care with Hospitalists to assume care 2/10.  Assessment & Plan: Principal Problem:   Neck infection Active Problems:   Alcohol abuse   Poor dentition   Septic shock (HCC)   Laryngeal edema   Multifocal pneumonia  Airway compromise   Chronic hepatitis C without hepatic coma (HCC)   Acute respiratory failure with hypoxemia (HCC)   Cardiac arrest, cause unspecified (HCC)   Haemophilis influenzae bacteremia    Malnutrition of moderate degree   Atelectasis   Tracheostomy care ([place])   Acute metabolic encephalopathy   Hypercarbia   Tracheostomy status (HCC)   Pressure injury of skin  Compromised airway due to neck cellulitis with bacteremia from H influenza s/p tracheostomy 1/4. - PCCM to continue tracheostomy care  Acute hypoxemic and hypercarbic respiratory failure:  - Wean O2 as tolerated.   Tobacco abuse, history of COPD. - Nicotine patch  Acute metabolic encephalopathy from sepsis, delirium: Hx of ETOH, opiod dependence. - Continue clonidine, will decrease clonazepam dosing given his improved mentation and some ongoing lethargy. Even at the lower dose, recommend holding for sedation.  - Continue methadone, will taper 32m q8h > 7.552mq8h and monitor, hoping to taper more in coming days.   - Continue IV dilaudid prn pain, IV haldol prn agitation. Restraints DC'ed this AM.  - continue thiamine, folic acid  PSVT:  - Continue beta blocker, optimize electrolytes.   HTN:  - Continue cozaar, lasix, lopressor  Elevated LFTs: Unclear etiology and course/duration. Improving steadily.  - Trend CMP, consider U/S vs. other work up if begins worsening  RN Pressure Injury Documentation: Pressure Injury 07/11/19 Neck Right Stage 3 -  Full thickness tissue loss. Subcutaneous fat may be visible but bone, tendon or muscle are NOT exposed. small wound adjacent to trach with purulent drainage (Active)  07/11/19 1630  Location: Neck  Location Orientation: Right  Staging: Stage 3 -  Full thickness tissue loss. Subcutaneous fat may be visible but bone,  tendon or muscle are NOT exposed.  Wound Description (Comments): small wound adjacent to trach with purulent drainage  Present on Admission: No   DVT  prophylaxis: Lovenox Code Status: Full Family Communication: None at bedside Disposition Plan: SNF vs. LTACH  Consultants:   PCCM primary through 07/18/2019  Infectious disease  ENT  Orthopedic surgery  Procedures:  Tracheostomy - placed 1/4 Peripheral IV in Right Forearm - placed 1/9 Peripheral IV in Left Forearm - placed 1/9 PICC line double lumen - placed 1/15  Antimicrobials: Bactrim 1/4 Azithromycin 1/4  Vancomycin 1/4 Zosyn  1/4 one dose Clindamycin 1/4 one dose Unasyn 1/5 >> 1/23  Ceftriaxone >> 1/29 for klebsiella  Subjective: Slept better last night, having no agitation this AM, DC'ed restraints die MBS per SLP. Used PMV to speak to me, denying complaints would be happy to take po once allowed. No chest pain or other issues voiced.   Objective: Vitals:   07/20/19 0401 07/20/19 0500 07/20/19 0730 07/20/19 1159  BP:  122/67  100/62  Pulse:  60  65  Resp:  19    Temp: 97.9 F (36.6 C)  99.1 F (37.3 C)   TempSrc: Axillary  Axillary   SpO2:  100%    Weight:      Height:        Intake/Output Summary (Last 24 hours) at 07/20/2019 1429 Last data filed at 07/20/2019 0817 Gross per 24 hour  Intake 1120 ml  Output 680 ml  Net 440 ml   Filed Weights   07/13/19 0239 07/14/19 0500 07/18/19 0427  Weight: 68.8 kg 69.7 kg 70.5 kg   Gen: 59 y.o. male in no distress Neck: Trach site c/d/i, fullness without fluctuance or cyst right paratracheal area nontender. Pulm: Nonlabored. Clear.  CV: Regular rate and rhythm. No murmur, rub, or gallop. No JVD, no dependent edema. GI: Abdomen soft, non-tender, non-distended, with normoactive bowel sounds.  Ext: Warm, no deformities Skin: No rashes, lesions or ulcers on visualized skin. Neuro: Alert and somewhat oriented. No definite focal neurological deficits. Psych: Judgement and insight appear impaired, cooperative.  Data Reviewed: I have personally reviewed following labs and imaging studies  CBC: Recent Labs  Lab  07/18/19 0420 07/20/19 0650  WBC 8.2 10.2  HGB 11.1* 11.0*  HCT 36.1* 37.2*  MCV 82.6 85.9  PLT 267 419   Basic Metabolic Panel: Recent Labs  Lab 07/14/19 0605 07/14/19 0605 07/15/19 0416 07/17/19 0723 07/18/19 0420 07/19/19 1040 07/20/19 0650  NA 138   < > 138 139 137 139 139  K 3.3*   < > 3.6 4.0 3.9 4.0 4.4  CL 100   < > 99 102 100 102 99  CO2 26   < > 26 28 26 25 27   GLUCOSE 118*   < > 123* 126* 119* 110* 118*  BUN 12   < > 13 17 16 16  22*  CREATININE 0.50*   < > 0.57* 0.59* 0.62 0.72 0.72  CALCIUM 9.4   < > 9.5 9.6 9.7 9.9 9.9  MG 1.4*  --  1.8 1.8 1.7  --   --   PHOS  --   --   --   --  4.4  --   --    < > = values in this interval not displayed.   GFR: Estimated Creatinine Clearance: 98.6 mL/min (by C-G formula based on SCr of 0.72 mg/dL). Liver Function Tests: Recent Labs  Lab 07/18/19 0420 07/19/19 1040 07/20/19 0650  AST 484*  204* 104*  ALT 440* 370* 273*  ALKPHOS 860* 660* 529*  BILITOT 2.3* 1.0 0.7  PROT 7.4 7.4 7.1  ALBUMIN 2.9* 3.0* 2.9*   No results for input(s): LIPASE, AMYLASE in the last 168 hours. No results for input(s): AMMONIA in the last 168 hours. Coagulation Profile: No results for input(s): INR, PROTIME in the last 168 hours. Cardiac Enzymes: No results for input(s): CKTOTAL, CKMB, CKMBINDEX, TROPONINI in the last 168 hours. BNP (last 3 results) No results for input(s): PROBNP in the last 8760 hours. HbA1C: No results for input(s): HGBA1C in the last 72 hours. CBG: Recent Labs  Lab 07/19/19 2019 07/19/19 2358 07/20/19 0357 07/20/19 0756 07/20/19 1157  GLUCAP 112* 99 111* 125* 112*   Lipid Profile: No results for input(s): CHOL, HDL, LDLCALC, TRIG, CHOLHDL, LDLDIRECT in the last 72 hours. Thyroid Function Tests: No results for input(s): TSH, T4TOTAL, FREET4, T3FREE, THYROIDAB in the last 72 hours. Anemia Panel: No results for input(s): VITAMINB12, FOLATE, FERRITIN, TIBC, IRON, RETICCTPCT in the last 72 hours. Urine  analysis:    Component Value Date/Time   COLORURINE YELLOW 06/12/2019 1230   APPEARANCEUR CLEAR 06/12/2019 1230   LABSPEC 1.042 (H) 06/12/2019 1230   PHURINE 5.0 06/12/2019 1230   GLUCOSEU NEGATIVE 06/12/2019 1230   HGBUR SMALL (A) 06/12/2019 1230   BILIRUBINUR NEGATIVE 06/12/2019 1230   KETONESUR NEGATIVE 06/12/2019 1230   PROTEINUR 30 (A) 06/12/2019 1230   UROBILINOGEN 0.2 07/02/2012 1607   NITRITE NEGATIVE 06/12/2019 1230   LEUKOCYTESUR NEGATIVE 06/12/2019 1230   No results found for this or any previous visit (from the past 240 hour(s)).    Radiology Studies: DG Swallowing Func-Speech Pathology  Result Date: 07/20/2019 Objective Swallowing Evaluation: Type of Study: MBS-Modified Barium Swallow Study  Patient Details Name: [name]. MRN: [id number] Date of Birth: 1961/04/02 Today's Date: 07/20/2019 Time: SLP Start Time (ACUTE ONLY): 1030 -SLP Stop Time (ACUTE ONLY): 1050 SLP Time Calculation (min) (ACUTE ONLY): 20 min Past Medical History: Past Medical History: Diagnosis Date . Alcohol abuse  . Alcoholic ([place])  . Back pain  . CAP (community acquired pneumonia) 11/26/2018 . COPD (chronic obstructive pulmonary disease) ([place])  . Empyema of right pleural space ([place]) 11/26/2018 . Heavy smoker  . Hep C w/ coma, chronic ([place])  . Loose, teeth  . Pancreatitis  . Poor dentition  . Tobacco abuse  Past Surgical History: Past Surgical History: Procedure Laterality Date . ARTERY REPAIR  2013  neck . BIOPSY  02/19/2019  Procedure: BIOPSY;  Surgeon: Ronnette Juniper, MD;  Location: [facility] ENDOSCOPY;  Service: Gastroenterology;; . ESOPHAGOGASTRODUODENOSCOPY (EGD) WITH PROPOFOL N/A 02/19/2019  Procedure: ESOPHAGOGASTRODUODENOSCOPY (EGD) WITH PROPOFOL;  Surgeon: Ronnette Juniper, MD;  Location: [place];  Service: Gastroenterology;  Laterality: N/A; . FOREIGN BODY REMOVAL  02/19/2019  Procedure: FOREIGN BODY REMOVAL;  Surgeon: Ronnette Juniper, MD;  Location: [facility] ENDOSCOPY;  Service: Gastroenterology;; . KNEE SURGERY  2013   rt . MICROLARYNGOSCOPY N/A 07/31/2014  Procedure: MICROLARYNGOSCOPY WITH BIOPSY ;  Surgeon: Rozetta Nunnery, MD;  Location: [place];  Service: ENT;  Laterality: N/A; . NECK SURGERY  2013  cervical fusion- . VASCULAR SURGERY   . VIDEO ASSISTED THORACOSCOPY (VATS)/EMPYEMA Right 11/26/2018  Procedure: VIDEO ASSISTED THORACOSCOPY (VATS)/EMPYEMA;  Surgeon: Rexene Alberts, MD;  Location: [place];  Service: Thoracic;  Laterality: Right; [name] VIDEO BRONCHOSCOPY  11/26/2018  Procedure: Video Bronchoscopy;  Surgeon: Rexene Alberts, MD;  Location: [place];  Service: Thoracic;; HPI: 59 year old man with a history  of alcohol use disorder, tobacco use disorder, HTN, and recent admission on 04/10/2019 for pancreatitis and hyponatremia who presents for evaluation of shortness of breath and neck swelling with loss of airway and PEA arrest requiring emergent tracheostomy. Trach changed from 4 cuffless back to [place] 6 cuffed on 1/30 after RR event, and transferred back to ICU.  Pt has been difficult to wean from sedation and intermittently agitated.  Subjective: Pt was alert and cooperative Assessment / Plan / Recommendation CHL IP CLINICAL IMPRESSIONS 07/20/2019 Clinical Impression PMV donned at onset of MBS. Unfortunately pt did not have significant improvements from initial MBS 07/06/19. He continues to demonstrate laryngeal penetration, pharyngeal residue. Hyoid extends forward during swallow but elevation is depressed therefore epiglottis hits the pharynx but does inverts minimally. Puree on back of epiglottis fell into airway and was mostly ejected during swallow; honey thick was penetrated close to vocal cords during swallow to clear residue. Aspiration present and brief with thin. Reduced epiglottic inversion also led to maximum residue post swallow with puree and moderate with honey. He was not appropriate for compensatory techniques to mitigate impairments. Recommend he continue npo status- SLP will continue  to work with pt- he is just starting to have increased periods of wakefullness and periods of somewhat clearer cognition. Hopefully can avoid longer term alternate means of nutrition but uncertain of this.    SLP Visit Diagnosis Dysphagia, oropharyngeal phase (R13.12) Attention and concentration deficit following -- Frontal lobe and executive function deficit following -- Impact on safety and function Severe aspiration risk   CHL IP TREATMENT RECOMMENDATION 07/20/2019 Treatment Recommendations Therapy as outlined in treatment plan below   Prognosis 07/20/2019 Prognosis for Safe Diet Advancement Good Barriers to Reach Goals Cognitive deficits Barriers/Prognosis Comment -- CHL IP DIET RECOMMENDATION 07/20/2019 SLP Diet Recommendations NPO Liquid Administration via -- Medication Administration Via alternative means Compensations -- Postural Changes --   CHL IP OTHER RECOMMENDATIONS 07/20/2019 Recommended Consults -- Oral Care Recommendations Oral care QID Other Recommendations --   CHL IP FOLLOW UP RECOMMENDATIONS 07/20/2019 Follow up Recommendations Skilled Nursing facility   [facility] IP FREQUENCY AND DURATION 07/20/2019 Speech Therapy Frequency (ACUTE ONLY) min 2x/week Treatment Duration 2 weeks      CHL IP ORAL PHASE 07/20/2019 Oral Phase Impaired Oral - Pudding Teaspoon -- Oral - Pudding Cup -- Oral - Honey Teaspoon -- Oral - Honey Cup Delayed oral transit;Lingual pumping Oral - Nectar Teaspoon -- Oral - Nectar Cup Lingual pumping Oral - Nectar Straw -- Oral - Thin Teaspoon -- Oral - Thin Cup WFL Oral - Thin Straw -- Oral - Puree Reduced posterior propulsion Oral - Mech Soft -- Oral - Regular -- Oral - Multi-Consistency -- Oral - Pill -- Oral Phase - Comment --  CHL IP PHARYNGEAL PHASE 07/20/2019 Pharyngeal Phase Impaired Pharyngeal- Pudding Teaspoon -- Pharyngeal -- Pharyngeal- Pudding Cup -- Pharyngeal -- Pharyngeal- Honey Teaspoon Penetration/Aspiration during swallow Pharyngeal -- Pharyngeal- Honey Cup NT Pharyngeal --  Pharyngeal- Nectar Teaspoon NT Pharyngeal -- Pharyngeal- Nectar Cup NT Pharyngeal -- Pharyngeal- Nectar Straw -- Pharyngeal -- Pharyngeal- Thin Teaspoon Penetration/Aspiration during swallow Pharyngeal Material enters airway, passes BELOW cords then ejected out Pharyngeal- Thin Cup NT Pharyngeal -- Pharyngeal- Thin Straw -- Pharyngeal -- Pharyngeal- Puree Pharyngeal residue - valleculae;Penetration/Aspiration during swallow Pharyngeal Material enters airway, remains ABOVE vocal cords and not ejected out Pharyngeal- Mechanical Soft -- Pharyngeal -- Pharyngeal- Regular -- Pharyngeal -- Pharyngeal- Multi-consistency -- Pharyngeal -- Pharyngeal- Pill -- Pharyngeal -- Pharyngeal Comment --  CHL IP CERVICAL  ESOPHAGEAL PHASE 07/20/2019 Cervical Esophageal Phase Impaired Pudding Teaspoon -- Pudding Cup -- Honey Teaspoon -- Honey Cup -- Nectar Teaspoon -- Nectar Cup -- Nectar Straw -- Thin Teaspoon -- Thin Cup -- Thin Straw -- Puree -- Mechanical Soft -- Regular -- Multi-consistency -- Pill -- Cervical Esophageal Comment -- Houston Siren 07/20/2019, 1:27 PM               Scheduled Meds: . bacitracin  1 application Topical BID  . bethanechol  5 mg Per Tube TID  . chlorhexidine gluconate (MEDLINE KIT)  15 mL Mouth Rinse BID  . Chlorhexidine Gluconate Cloth  6 each Topical Daily  . clonazepam  0.5 mg Per Tube Q8H  . cloNIDine  0.3 mg Per Tube Q6H  . famotidine  20 mg Per Tube BID  . folic acid  1 mg Per Tube Daily  . furosemide  40 mg Per Tube Daily  . guaiFENesin  15 mL Per Tube Q6H  . heparin injection (subcutaneous)  5,000 Units Subcutaneous Q8H  . insulin aspart  0-15 Units Subcutaneous Q4H  . losartan  25 mg Per Tube Daily  . mouth rinse  15 mL Mouth Rinse 10 times per day  . methadone  7.5 mg Per Tube Q8H  . metoprolol tartrate  75 mg Per Tube BID  . nicotine  14 mg Transdermal Daily  . polyethylene glycol  17 g Per Tube Daily  . QUEtiapine  200 mg Per Tube BID  . sertraline  25 mg Per Tube  Daily  . sodium chloride flush  10-40 mL Intracatheter Q12H  . thiamine  100 mg Per Tube Daily   Continuous Infusions: . sodium chloride Stopped (07/17/19 2053)  . feeding supplement (VITAL AF 1.2 CAL) 70 mL/hr at 07/20/19 0500     LOS: 38 days   Time spent: 35 minutes.  Patrecia Pour, MD [facility] [email or web address] 07/20/2019, 2:29 PM

## 2019-07-20 NOTE — Progress Notes (Signed)
Daily progress note  Patient Alert and oriented to self. Patient is calm and pleasant today, is able to follow commands and express some needs (feeling cold/ hot). Mid shift, patient seemed tired and abit lethargic, MD was notified.  Patient was able to tolerate having mittens and restraints off all day. Patient is resting at bedside with belongings close by

## 2019-07-20 NOTE — Progress Notes (Signed)
Patient confused agitated trying to climb out of bed and pulling at trach and cortrack tubes. Attempt to calm and reorient patient unsuccessful prn dose haldol given at 2038 behavior not changed. Text paged NP [place] for order for safety sitter.New order received for safety sitter.

## 2019-07-20 NOTE — Progress Notes (Signed)
PULMONARY / CRITICAL CARE MEDICINE  NAME:  [name]., MRN:  [id number], DOB:  1961/04/21, LOS: 72 ADMISSION DATE:  06/12/2019, CONSULTATION DATE:  06/12/2019 REFERRING MD:  Alinda Sierras, CHIEF COMPLAINT:  Shortness of breath, sore throat, and neck swelling  BRIEF HISTORY:    59 yo male smoker with hx of ETOH had admission in November 2020 for pancreatitis and hyponatremia presented with dyspnea and neck swelling from cellulitis with acute airway compromise resulting in PEA requiring emergent tracheostomy.  Found to have H influenza bacteremia.  SIGNIFICANT PAST MEDICAL HISTORY   ETOH, Pneumonia, COPD, Rt lung empyema, Hep C, Pancreatitis  SIGNIFICANT EVENTS:  1/4 : Lost airway and PEA arrest on, requiring emergent tracheostomy and ROSC obtained 1/5: 4/4 Blood cultures growing H.Influenza , narrowed to Unasyn 1/5 > 1/14: Continued on antibiotics without any significant improvement  1/14: New right foot swelling for which MRI obtained due to concerns for seeding of cellulitis from neck 1/15: MRI with synovitis and subcutaneous edema suggestive of cellulitis; orthopedics consulted for right ankle aspiration - aspirate with positive crystals suggestive of gout - patient started on NSAIDs and steroids 1/16 Started on dilaudid, D/C fentanyl  1/17: Patient agitated early AM, q2h prn versed prescribed 1/18: Hb 5.2, transfused 2u pRBC; started methadone  1/19: Klonopin increased to tid and metoprolol added for SVTs 1/20: precedex gtt discontinued overnight for concerns of bradycardia  1/21: clonidine taper, 24hr trach collar 1/22: continuing trach collar 1/23: last day of unasyn for cellulitis, steroid taper and colchicine for gout, continuing trach collar  1/24: 72 hours of trach collar 1/25 changed to 4 cuffless 1/26 sedation cut back 1/30 returned to ICU for hypercarbic respiratory failure and encephalopathy 2/2 inadvertent high dose of methadone and resultant resp faillure. Trach  positioned in Cric so underwent re-do 2/06 ventilator liberated. Has been TC for 72 hours. 2/09 off precedex, transfer to progressive care 2/11 awaiting MBS CULTURES:  Influenza A : Negative Influenza B : Negtive Sars Coronavirus: Negative MRSA Nasopharyngeal negative 1/4 Blood cultures: 4/4 Positive for H. Influenza  Urine culture: No growth 1/6 Blood cultures: NGTD on 1/11 1/13 Tracheal aspirate: few klebsiella pneumoniae  1/16 R ankle aspirate : monosodium urate crystals, no organisms on gram stain; culture w/ no growth to date 1/27: Sputum culture>>> klebsiella  ANTIBIOTICS:  Bactrim 1/4 Azithromycin 1/4  Vancomycin 1/4 Zosyn  1/4 one dose Clindamycin 1/4 one dose Unasyn 1/5 >> 1/23  Ceftriaxone >> 1/29 for klebsiella  LINES/TUBES:  Tracheostomy - placed 1/4 Peripheral IV in Right Forearm - placed 1/9 Peripheral IV in Left Forearm - placed 1/9 PICC line double lumen - placed 1/15 Trach redo 1/30:   CONSULTANTS:  Infectious Disease  ENT  Orthopedic surgery  SUBJECTIVE:  .  No distress.  Just returned from SLP for modified barium swallow.  Still awaiting results  OBJECTIVE:   CONSTITUTIONAL: Blood Pressure 122/67   Pulse 60   Temperature 99.1 F (37.3 C) (Axillary)   Respiration 19   Height 5\' 8"  (1.727 m)   Weight 70.5 kg   Oxygen Saturation 100%   Body Mass Index 23.63 kg/m   I/O last 3 completed shifts: In: 2345 [NG/GT:2345] Out: 1290 [Urine:1040; Stool:250]   FiO2 (%):  [28 %] 28 %   PHYSICAL EXAM:  General 59 year old male currently resting in bed he is in no acute distress HEENT normocephalic atraumatic there is a size 6 cuffed tracheostomy with cuff deflated tracheal site unremarkable cough mechanics strong voice somewhat weak but improving  daily Pulmonary: Coarse scattered rhonchi that clear with cough no accessory use on 28% humidified trach collar Cardiac: Regular rate and rhythm Abdomen: Soft nontender Extremities: Warm and dry Neuro:  Awake, today pleasantly confused but cooperative   RESOLVED PROBLEMS:  Klebsiella HCAP, Aspiration pneumonia  ASSESSMENT AND PLAN    Trach dependent due Compromised airway 2nd to neck cellulitis with bacteremia from H influenza. Tobacco abuse with hx of COPD. Acute metabolic encephalopathy from sepsis. Hx of ETOH, opiod dependence. Hypertension. Paroxysmal supraventricular tachycardia. Elevated LFTs. 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  Happy to see his delirium is improving, awaiting modified barium swallow results.  I think he would be well served by downsizing once again to size 4 cuffless, I do not think he will need long-term tracheostomy.  It seems as though his respiratory failure and setback was likely secondary to his complicated sedation requirements that he once had  Plan We will order size 4 cuffless tracheostomy, and change once it arrives Continue PMV for at least 24 hours after trach change, and following that if cough mechanics support would consider starting capping trials Continue to down titrate sedating medications Await SLP impression from modified barium swallow  I will see him again tomorrow on 2/12\  Erick Colace ACNP-BC [place] Pager # [phone] OR # [phone] if no answer

## 2019-07-21 ENCOUNTER — Inpatient Hospital Stay (HOSPITAL_COMMUNITY): Payer: Medicaid Other

## 2019-07-21 LAB — GLUCOSE, CAPILLARY
Glucose-Capillary: 108 mg/dL — ABNORMAL HIGH (ref 70–99)
Glucose-Capillary: 111 mg/dL — ABNORMAL HIGH (ref 70–99)
Glucose-Capillary: 139 mg/dL — ABNORMAL HIGH (ref 70–99)
Glucose-Capillary: 93 mg/dL (ref 70–99)
Glucose-Capillary: 99 mg/dL (ref 70–99)
Glucose-Capillary: 89 mg/dL (ref 70–99)

## 2019-07-21 MED ORDER — HALOPERIDOL LACTATE 5 MG/ML IJ SOLN: 2.0000 mg | Freq: Four times a day (QID) | INTRAMUSCULAR | Status: DC | PRN

## 2019-07-21 MED ORDER — HALOPERIDOL LACTATE 5 MG/ML IJ SOLN
5.0000 mg | Freq: Four times a day (QID) | INTRAMUSCULAR | Status: DC | PRN
  Administered 2019-07-25: 5 mg via INTRAVENOUS
  Filled 2019-07-21: qty 1

## 2019-07-21 MED ORDER — CLONAZEPAM 0.5 MG PO TBDP
1.0000 mg | ORAL_TABLET | Freq: Three times a day (TID) | ORAL | Status: DC
  Administered 2019-07-21 – 2019-07-23 (×5): 1 mg
  Filled 2019-07-21 (×5): qty 2

## 2019-07-21 MED ORDER — METHADONE HCL 10 MG PO TABS
10.0000 mg | ORAL_TABLET | Freq: Three times a day (TID) | ORAL | Status: DC
  Administered 2019-07-22 – 2019-07-23 (×4): 10 mg
  Filled 2019-07-21 (×5): qty 1

## 2019-07-21 NOTE — Progress Notes (Signed)
Physical Therapy Treatment Patient Details Name: [name]. MRN: [id number] DOB: June 03, 1961 Today's Date: 07/21/2019    History of Present Illness Pt is a 59 y.o. male admitted 06/12/19 for SOB and neck swelling. Admitted to ICU with loss of airway and PEA arrest requiring emergent trach; has been difficult to wean off sedation. 1/15 R foot MRI suggestive of cellulitis, s/p aspiration. 1/30 return to ICU for hypercarbic respiratory failure and encephalopathy. 2/6 on trach collar 72 hrs and vent liberated; 2/9 orders to leave ICU for progressive care. PMH includes recent admission for pancreatitis (04/10/19), ETOH and tobacco use, HTN.   PT Comments    Pt seen for additional session. Able to tolerate sitting in recliner ~1 hr, but becoming too restless and requesting to get up. Pt with increased distractibility, requiring maxA+2 to initiate and perform transfer, pt actively resting requiring max, multimodal cues for mobility. RN present to assist. Once supine, pt attempting to get OOB and pull at lines, ultimately requiring replacement of soft mittens and wrist restraints. Pt restless, but pleasant during session.    Follow Up Recommendations  SNF;LTACH;Supervision/Assistance - 24 hour     Equipment Recommendations  (defer)    Recommendations for Other Services       Precautions / Restrictions Precautions Precautions: Fall;Other (comment) Precaution Comments: trach collar, bilat soft mittens; wrist and waist restraints Restrictions Weight Bearing Restrictions: No    Mobility  Bed Mobility Overal bed mobility: Needs Assistance Bed Mobility: Sit to Supine Rolling: Max assist Sidelying to sit: Max assist;HOB elevated Supine to sit: Min assist;+2 for safety/equipment;HOB elevated Sit to supine: Max assist;+2 for safety/equipment   General bed mobility comments: Pt not following commands, maxA+2 to return to supine  Transfers Overall transfer level: Needs  assistance Equipment used: 2 person hand held assist Transfers: Sit to/from [facility] Sit to Stand: Max assist;+2 physical assistance Stand pivot transfers: Max assist;+2 physical assistance       General transfer comment: Initial stand with maxA+2, pt with difficulty keeping bilateral HHA in place; stood again with bialteral knee blocking and mod-maxA+2 to pivot to recliner  Ambulation/Gait             General Gait Details: bilateral knee buckling in standing, difficulty taking steps   Stairs             Wheelchair Mobility    Modified Rankin (Stroke Patients Only)       Balance Overall balance assessment: Needs assistance Sitting-balance support: Bilateral upper extremity supported;Feet supported Sitting balance-Leahy Scale: Fair Sitting balance - Comments: Fair sitting balance with close monitoring due to restlessness and reaching at lines Postural control: Posterior lean Standing balance support: Bilateral upper extremity supported Standing balance-Leahy Scale: Zero Standing balance comment: difficulty achieving full stand but come close. B knees buckling. +2 assist to stand                            Cognition Arousal/Alertness: Awake/alert Behavior During Therapy: Restless Overall Cognitive Status: Impaired/Different from baseline Area of Impairment: Orientation;Attention;Memory;Following commands;Safety/judgement;Awareness;Problem solving                   Current Attention Level: Focused Memory: Decreased recall of precautions;Decreased short-term memory Following Commands: Follows one step commands with increased time;Follows one step commands inconsistently Safety/Judgement: Decreased awareness of safety;Decreased awareness of deficits Awareness: Intellectual Problem Solving: Difficulty sequencing;Requires verbal cues General Comments: Pt with less command following, at times actively resisting  movement despite  stating he wants to get up and back to bed; difficult to redirect      Exercises      General Comments General comments (skin integrity, edema, etc.): SpO2 100% on 5L O2 via trach collar (28% FiO2); post-transfer BP 118/69, HR 70      Pertinent Vitals/Pain Pain Assessment: Faces Faces Pain Scale: Hurts a little bit Pain Location: grimacing - pt unable to specify where Pain Descriptors / Indicators: Discomfort;Grimacing Pain Intervention(s): Monitored during session    Home Living                      Prior Function            PT Goals (current goals can now be found in the care plan section) Acute Rehab PT Goals Patient Stated Goal: "I'm gonna go to the bathroom" PT Goal Formulation: With patient Time For Goal Achievement: 08/04/19 Potential to Achieve Goals: Fair Progress towards PT goals: Progressing toward goals    Frequency    Min 2X/week      PT Plan Current plan remains appropriate    Co-evaluation PT/OT/SLP Co-Evaluation/Treatment: Yes Reason for Co-Treatment: Complexity of the patient's impairments (multi-system involvement);Necessary to address cognition/behavior during functional activity;For patient/therapist safety;To address functional/ADL transfers PT goals addressed during session: Mobility/safety with mobility;Balance OT goals addressed during session: ADL's and self-care;Strengthening/ROM      AM-PAC PT "6 Clicks" Mobility   Outcome Measure  Help needed turning from your back to your side while in a flat bed without using bedrails?: A Lot Help needed moving from lying on your back to sitting on the side of a flat bed without using bedrails?: Total Help needed moving to and from a bed to a chair (including a wheelchair)?: A Lot Help needed standing up from a chair using your arms (e.g., wheelchair or bedside chair)?: A Lot Help needed to walk in hospital room?: Total Help needed climbing 3-5 steps with a railing? : Total 6 Click  Score: 9    End of Session Equipment Utilized During Treatment: Oxygen Activity Tolerance: Patient tolerated treatment well;Other (comment)(limited by restlessness) Patient left: in bed;with call bell/phone within reach;with bed alarm set;with restraints reapplied Nurse Communication: Mobility status PT Visit Diagnosis: Muscle weakness (generalized) (M62.81);Difficulty in walking, not elsewhere classified (R26.2)     Time: 1119-1140 PT Time Calculation (min) (ACUTE ONLY): 21 min  Charges:  $Therapeutic Activity: 8-22 mins                    Mabeline Caras, PT, DPT Acute Rehabilitation Services  Pager [phone] Office [phone]  Derry Lory 07/21/2019, 12:43 PM

## 2019-07-21 NOTE — Progress Notes (Signed)
Patient suctioned for thick tan mucous plug.

## 2019-07-21 NOTE — Progress Notes (Signed)
PULMONARY / CRITICAL CARE MEDICINE  NAME:  William Fronheiser., MRN:  [id number], DOB:  [birth year], LOS: 9 ADMISSION DATE:  06/12/2019, CONSULTATION DATE:  06/12/2019 REFERRING MD:  Alinda Sierras, CHIEF COMPLAINT:  Shortness of breath, sore throat, and neck swelling  BRIEF HISTORY:    59 yo male smoker with hx of ETOH had admission in November 2020 for pancreatitis and hyponatremia presented with dyspnea and neck swelling from cellulitis with acute airway compromise resulting in PEA requiring emergent tracheostomy.  Found to have H influenza bacteremia.  SIGNIFICANT PAST MEDICAL HISTORY   ETOH, Pneumonia, COPD, Rt lung empyema, Hep C, Pancreatitis  SIGNIFICANT EVENTS:  1/4 : Lost airway and PEA arrest on, requiring emergent tracheostomy and ROSC obtained 1/5: 4/4 Blood cultures growing H.Influenza , narrowed to Unasyn 1/5 > 1/14: Continued on antibiotics without any significant improvement  1/14: New right foot swelling for which MRI obtained due to concerns for seeding of cellulitis from neck 1/15: MRI with synovitis and subcutaneous edema suggestive of cellulitis; orthopedics consulted for right ankle aspiration - aspirate with positive crystals suggestive of gout - patient started on NSAIDs and steroids 1/16 Started on dilaudid, D/C fentanyl  1/17: Patient agitated early AM, q2h prn versed prescribed 1/18: Hb 5.2, transfused 2u pRBC; started methadone  1/19: Klonopin increased to tid and metoprolol added for SVTs 1/20: precedex gtt discontinued overnight for concerns of bradycardia  1/21: clonidine taper, 24hr trach collar 1/22: continuing trach collar 1/23: last day of unasyn for cellulitis, steroid taper and colchicine for gout, continuing trach collar  1/24: 72 hours of trach collar 1/25 changed to 4 cuffless 1/26 sedation cut back 1/30 returned to ICU for hypercarbic respiratory failure and encephalopathy 2/2 inadvertent high dose of methadone and resultant resp faillure. Trach  positioned in Cric so underwent re-do 2/06 ventilator liberated. Has been TC for 72 hours. 2/09 off precedex, transfer to progressive care 2/11 failed MBS 2/12: Required tracheal suctioning, decided to hold off on tracheostomy change over the weekend. CULTURES:  Influenza A : Negative Influenza B : Negtive Sars Coronavirus: Negative MRSA Nasopharyngeal negative 1/4 Blood cultures: 4/4 Positive for H. Influenza  Urine culture: No growth 1/6 Blood cultures: NGTD on 1/11 1/13 Tracheal aspirate: few klebsiella pneumoniae  1/16 R ankle aspirate : monosodium urate crystals, no organisms on gram stain; culture w/ no growth to date 1/27: Sputum culture>>> klebsiella  ANTIBIOTICS:  Bactrim 1/4 Azithromycin 1/4  Vancomycin 1/4 Zosyn  1/4 one dose Clindamycin 1/4 one dose Unasyn 1/5 >> 1/23  Ceftriaxone >> 1/29 for klebsiella  LINES/TUBES:  Tracheostomy - placed 1/4 Peripheral IV in Right Forearm - placed 1/9 Peripheral IV in Left Forearm - placed 1/9 PICC line double lumen - placed 1/15 Trach redo 1/30:   CONSULTANTS:  Infectious Disease  ENT  Orthopedic surgery  SUBJECTIVE:  In chair no distress  OBJECTIVE:   CONSTITUTIONAL: Blood Pressure 118/63 (BP Location: Left Arm)   Pulse 64   Temperature 98.3 F (36.8 C) (Oral)   Respiration 16   Height 5\' 8"  (1.727 m)   Weight 70.2 kg   Oxygen Saturation 100%   Body Mass Index 23.54 kg/m   I/O last 3 completed shifts: In: 2800 [NG/GT:2800] Out: 1325 [Urine:1225; Stool:100]   FiO2 (%):  [28 %] 28 %   PHYSICAL EXAM:  General 59 year old white male currently sitting up in bed and in no acute distress this morning H EENT mucous membranes moist sclera nonicteric no JVD size 6 cuffed tracheostomy in place, the  cuff is deflated, the Passy-Muir valve is in place and his speech quality is hoarse and soft Pulmonary: Coarse, scattered rhonchi no accessory use today Cardiac: Regular rate and rhythm Abdomen soft not tender no  organomegaly Extremities warm and dry Neuro awake, cooperative but still confused no focal motor deficits appreciated GU clear yellow   RESOLVED PROBLEMS:  Klebsiella HCAP, Aspiration pneumonia  ASSESSMENT AND PLAN    Trach dependent due Compromised airway 2nd to neck cellulitis with bacteremia from H influenza. Tobacco abuse with hx of COPD. Acute metabolic encephalopathy from sepsis. Hx of ETOH, opiod dependence. Hypertension. Paroxysmal supraventricular tachycardia. Elevated LFTs. 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  I think it might be best to wait another day or so before we downsize.  I wonder if he aspirated during his swallow eval  Plan Continue to encourage PMV during day  Continue routine trach care  Continued aerosol trach collar  Aspiration precautions  Treat delirium  Mobilize We will see him again on Monday, might be best to go to a 6 cuffless first if secretions are still an issue on Monday    [name] E [name] ACNP-BC [place] Pager # [phone] OR # [phone] if no answer

## 2019-07-21 NOTE — Progress Notes (Addendum)
Notified of worsening agitation/combativeness and noting bilious output from NG tube. Will check STAT KUB. Can give haldol 5mg  IV again now, restart higher dose clonazepam (1mg  BID) and methadone (10mg  BID) as these could've been tapered too quickly. Also ordered sitter to resume.   Vance Gather, MD 07/21/2019 6:18 PM

## 2019-07-21 NOTE — Progress Notes (Signed)
  Speech Language Pathology Treatment: Dysphagia;Passy Muir Speaking valve  Patient Details Name: [name]. MRN: [id number] DOB: 10-24-1960 Today's Date: 07/21/2019 Time: [id number] SLP Time Calculation (min) (ACUTE ONLY): 20 min  Assessment / Plan / Recommendation Clinical Impression  [name] seen for dysphagia and use of upper airway to facilitate cough, swallow and verbal communication. He was up in chair and initially calm and redirectable but became restless d/t needing to void. Given verbal reminders of catheter and rectal tube. Pt's PMV donned after working with PT/OT and RN present. Prior to restlessness he engaged in conversation with lower intensity with improved intelligibility given reminders to use strategies. RR and HR stable. SpO2 came unattached.  Pt is alert today and was alert as well yesterday during MBS. Dysphagia results from suboptimal laryngeal elevation resulting in poor epiglottic deflection and penetration, aspiration and significant vallecular residue. Cortrak was not obstruction as MBS compared with prior study without Cortrak present. He consumed trials applesauce with coughs (delayed) that were not present on outset of session. Pt may continue to wear PMV with full supervision, removing when leaving room and ensuring that cuff is deflated. CCM note states possible change to cuffless in near future.    HPI HPI: 59 year old man with a history of alcohol use disorder, tobacco use disorder, HTN, and recent admission on 04/10/2019 for pancreatitis and hyponatremia who presents for evaluation of shortness of breath and neck swelling with loss of airway and PEA arrest requiring emergent tracheostomy. Trach changed from 4 cuffless back to Shiley 6 cuffed on 1/30 after RR event, and transferred back to ICU.  Pt has been difficult to wean from sedation and intermittently agitated.      SLP Plan  Continue with current plan of care       Recommendations  Diet  recommendations: NPO Medication Administration: Via alternative means      Patient may use Passy-Muir Speech Valve: Intermittently with supervision PMSV Supervision: Full MD: Please consider changing trach tube to : Smaller size;Cuffless         Oral Care Recommendations: Oral care QID Follow up Recommendations: Skilled Nursing facility SLP Visit Diagnosis: Dysphagia, oropharyngeal phase (R13.12) Plan: Continue with current plan of care                       Houston Siren 07/21/2019, 10:37 AM  Orbie Pyo Colvin Caroli.Ed Risk analyst [phone] Office [phone]

## 2019-07-21 NOTE — Progress Notes (Signed)
Physical Therapy Treatment Patient Details Name: [name]. MRN: [id number] DOB: Sep 14, 1960 Today's Date: 07/21/2019    History of Present Illness Pt is a 59 y.o. male admitted 06/12/19 for SOB and neck swelling. Admitted to ICU with loss of airway and PEA arrest requiring emergent trach; has been difficult to wean off sedation. 1/15 R foot MRI suggestive of cellulitis, s/p aspiration. 1/30 return to ICU for hypercarbic respiratory failure and encephalopathy. 2/6 on trach collar 72 hrs and vent liberated; 2/9 orders to leave ICU for progressive care. PMH includes recent admission for pancreatitis (04/10/19), ETOH and tobacco use, HTN.   PT Comments    Pt progressing with mobility. Improved initiation of movement and simple command following, although pt remains restless with very poor attention, easily distracted by lines and requiring frequent redirection. Able to perform stand pivot transfer to recliner with mod-maxA+2, requiring assist to maintain trunk elevation and block bilateral knee instability; unable to take steps this session. VSS on 5L O2 via trach collar at 28% FiO2. Motivated motivated to get out of bed.    Follow Up Recommendations  SNF;LTACH;Supervision/Assistance - 24 hour     Equipment Recommendations  (defer)    Recommendations for Other Services       Precautions / Restrictions Precautions Precautions: Fall;Other (comment) Precaution Comments: trach collar, bilat soft mittens; wrist and waist restraints Restrictions Weight Bearing Restrictions: No    Mobility  Bed Mobility Overal bed mobility: Needs Assistance Bed Mobility: Supine to Sit Rolling: Max assist Sidelying to sit: Max assist;HOB elevated Supine to sit: Min assist;+2 for safety/equipment;HOB elevated     General bed mobility comments: Cues to attend to task, pt distracted by rectal tube; minA for trunk elevation; +2 assist lines and pt safety  Transfers Overall transfer level: Needs  assistance Equipment used: 2 person hand held assist Transfers: Sit to/from [facility] Sit to Stand: Mod assist;Max assist;+2 physical assistance;+2 safety/equipment Stand pivot transfers: Max assist;+2 physical assistance       General transfer comment: Initial stand with maxA+2, pt with difficulty keeping bilateral HHA in place; stood again with bialteral knee blocking and mod-maxA+2 to pivot to recliner  Ambulation/Gait             General Gait Details: bilateral knee buckling in standing, difficulty taking steps   Stairs             Wheelchair Mobility    Modified Rankin (Stroke Patients Only)       Balance Overall balance assessment: Needs assistance Sitting-balance support: Bilateral upper extremity supported;Feet supported Sitting balance-Leahy Scale: Fair Sitting balance - Comments: Fair sitting balance with close monitoring due to restlessness and reaching at lines Postural control: Posterior lean Standing balance support: Bilateral upper extremity supported Standing balance-Leahy Scale: Zero Standing balance comment: difficulty achieving full stand but come close. B knees buckling. +2 assist to stand                            Cognition Arousal/Alertness: Awake/alert Behavior During Therapy: Restless Overall Cognitive Status: Impaired/Different from baseline Area of Impairment: Orientation;Attention;Memory;Following commands;Safety/judgement;Awareness;Problem solving                   Current Attention Level: Focused Memory: Decreased recall of precautions;Decreased short-term memory Following Commands: Follows one step commands with increased time;Follows one step commands inconsistently Safety/Judgement: Decreased awareness of safety;Decreased awareness of deficits Awareness: Intellectual Problem Solving: Difficulty sequencing;Requires verbal cues General Comments: Pt  following majority of simple commands, but  very poor attention requiring frequent redirection to task. Initially conversing well with PMV, but then requiring consistent cues to speak up and clearly. Pt initiating movement well but limited by restlessness and distractability; distracted by needing to void despite multiple attempts to reorient to catheter/rectal tube      Exercises      General Comments General comments (skin integrity, edema, etc.): SpO2 98-100% on 5L O2 via trach collar (28% FiO2); post-transfer BP 123/66      Pertinent Vitals/Pain Pain Assessment: Faces Faces Pain Scale: Hurts a little bit Pain Location: grimacing - pt unable to specify where Pain Descriptors / Indicators: Discomfort;Grimacing Pain Intervention(s): Monitored during session    Home Living                      Prior Function            PT Goals (current goals can now be found in the care plan section) Acute Rehab PT Goals Patient Stated Goal: "I'm gonna go to the bathroom" PT Goal Formulation: With patient Time For Goal Achievement: 08/04/19 Potential to Achieve Goals: Fair Progress towards PT goals: Progressing toward goals    Frequency    Min 2X/week      PT Plan Current plan remains appropriate    Co-evaluation PT/OT/SLP Co-Evaluation/Treatment: Yes Reason for Co-Treatment: Complexity of the patient's impairments (multi-system involvement);Necessary to address cognition/behavior during functional activity;For patient/therapist safety;To address functional/ADL transfers PT goals addressed during session: Mobility/safety with mobility;Balance OT goals addressed during session: ADL's and self-care;Strengthening/ROM      AM-PAC PT "6 Clicks" Mobility   Outcome Measure  Help needed turning from your back to your side while in a flat bed without using bedrails?: A Lot Help needed moving from lying on your back to sitting on the side of a flat bed without using bedrails?: A Lot Help needed moving to and from a bed to  a chair (including a wheelchair)?: A Lot Help needed standing up from a chair using your arms (e.g., wheelchair or bedside chair)?: A Lot Help needed to walk in hospital room?: Total Help needed climbing 3-5 steps with a railing? : Total 6 Click Score: 10    End of Session Equipment Utilized During Treatment: Oxygen;Gait belt Activity Tolerance: Patient tolerated treatment well Patient left: in chair;with call bell/phone within reach;with chair alarm set;with restraints reapplied Nurse Communication: Mobility status PT Visit Diagnosis: Muscle weakness (generalized) (M62.81);Difficulty in walking, not elsewhere classified (R26.2)     Time: [id number] PT Time Calculation (min) (ACUTE ONLY): 41 min  Charges:  $Therapeutic Activity: 8-22 mins                    Mabeline Caras, PT, DPT Acute Rehabilitation Services  Pager [phone] Office [phone]  Derry Lory 07/21/2019, 12:32 PM

## 2019-07-21 NOTE — Progress Notes (Signed)
PICC line removed per order and line is intact. Vaseline gauze and gauze dressing applied and pressure held. Site clean, dry, and intact. Patient and RN aware that patient is on bedrest for 30 minutes. Patient aware to leave dressing on and dry for 24 hours.  

## 2019-07-21 NOTE — Progress Notes (Addendum)
PROGRESS NOTE  [name].  TDD:[id number] DOB: [birth year] DOA: 06/12/2019 PCP: Alvester Chou, NP   Brief Narrative: William Gehrig. is a 59 y.o. male with a history of alcohol abuse, hyponatremia, and admission for acute pancreatitis in November 2020 who presented 06/12/2019 with neck swelling found to have cellulitis with acute airway compromise resulting in PEA arrest requiring emergent tracheostomy.  Significant events: 1/4 : Lost airway and PEA arrest on, requiring emergent tracheostomy and ROSC obtained 1/5: 4/4 Blood cultures growing H.Influenza , narrowed to Unasyn 1/5 > 1/14: Continued on antibiotics without any significant improvement  1/14: New right foot swelling for which MRI obtained due to concerns for seeding of cellulitis from neck 1/15: MRI with synovitis and subcutaneous edema suggestive of cellulitis; orthopedics consulted for right ankle aspiration - aspirate with positive crystals suggestive of gout - patient started on NSAIDs and steroids 1/16 Started on dilaudid, D/C fentanyl  1/17: Patient agitated early AM, q2h prn versed prescribed 1/18: Hb 5.2, transfused 2u pRBC; started methadone  1/19: Klonopin increased to tid and metoprolol added for SVTs 1/20: precedex gtt discontinued overnight for concerns of bradycardia  1/21: clonidine taper, 24hr trach collar 1/22: continuing trach collar 1/23: last day of unasyn for cellulitis, steroid taper and colchicine for gout, continuing trach collar  1/24: 72 hours of trach collar 1/25 changed to 4 cuffless 1/26 sedation cut back 1/30 returned to ICU for hypercarbic respiratory failure and encephalopathy 2/06 ventilator liberated. Has been TC for 72 hours. 2/09 off precedex, transfer to progressive care  2/10 Hospitalists assume care 2/11 Failed swallow study  Assessment & Plan: Principal Problem:   Neck infection Active Problems:   Alcohol abuse   Poor dentition   Septic shock (HCC)   Laryngeal edema  Multifocal pneumonia   Airway compromise   Chronic hepatitis C without hepatic coma (HCC)   Acute respiratory failure with hypoxemia (HCC)   Cardiac arrest, cause unspecified ([place])   Haemophilis influenzae bacteremia    Malnutrition of moderate degree   Atelectasis   Tracheostomy care ([place])   Acute metabolic encephalopathy   Hypercarbia   Tracheostomy status (HCC)   Pressure injury of skin  Compromised airway due to neck cellulitis with bacteremia from H influenza s/p tracheostomy 1/4. - PCCM to continue tracheostomy care, remains on trach collar.   Acute hypoxemic and hypercarbic respiratory failure:  - Wean O2 as tolerated.   Tobacco abuse, history of COPD. - Nicotine patch  Acute metabolic encephalopathy from sepsis, delirium: Hx of ETOH, opiod dependence. - Continue clonidine, decreased clonazepam dosing with improved lethargy. Even at the lower dose, recommend holding for sedation.  - Continue methadone, tapered 63m q8h > 7.559mq8h on 2/11. Hoping to taper more in coming days.   - Continue IV dilaudid prn pain, IV haldol prn agitation. Minimize restraints as much as possible, though remains impulsive with inattention and requiring very frequent redirection. - Continue thiamine, folic acid  PSVT:  - Continue beta blocker, optimize electrolytes.   HTN:  - Continue cozaar, lasix, lopressor  Elevated LFTs: Unclear etiology and course/duration. Improving steadily.  - Trend CMP, consider U/S vs. other work up if begins worsening  RN Pressure Injury Documentation: Pressure Injury 07/11/19 Neck Right Stage 3 -  Full thickness tissue loss. Subcutaneous fat may be visible but bone, tendon or muscle are NOT exposed. small wound adjacent to trach with purulent drainage (Active)  07/11/19 1630  Location: Neck  Location Orientation: Right  Staging: Stage 3 -  Full thickness tissue loss. Subcutaneous fat may be visible but bone, tendon or muscle are NOT exposed.  Wound  Description (Comments): small wound adjacent to trach with purulent drainage  Present on Admission: No   DVT prophylaxis: Lovenox Code Status: Full Family Communication: None at bedside Disposition Plan: SNF vs. LTACH. Got PIV, will DC PICC which has been in since 1/15.  Consultants:   PCCM primary through 07/18/2019  Infectious disease  ENT  Orthopedic surgery  Procedures:  Tracheostomy - placed 1/4 Peripheral IV in Right Forearm - placed 1/9 Peripheral IV in Left Forearm - placed 1/9 PICC line double lumen - placed 1/15 - removed 2/12  Antimicrobials: Bactrim 1/4 Azithromycin 1/4  Vancomycin 1/4 Zosyn  1/4 one dose Clindamycin 1/4 one dose Unasyn 1/5 >> 1/23  Ceftriaxone >> 1/29 for klebsiella  Subjective: Recurrent agitated delirium last night, back in some mittens but up to chair today. Secretions requiring suctioning this AM. He appears to be in a jovial mood though not oriented he is conversant somewhat.  Objective: Vitals:   07/21/19 0342 07/21/19 0404 07/21/19 0803 07/21/19 1134  BP: (!) 105/59 (!) 105/59 118/63 118/69  Pulse: 64  64 71  Resp: 14  16 (!) 24  Temp: 98.9 F (37.2 C)  98.3 F (36.8 C) 98.2 F (36.8 C)  TempSrc: Oral  Oral Oral  SpO2: 100%  100% 100%  Weight: 70.2 kg     Height:        Intake/Output Summary (Last 24 hours) at 07/21/2019 1408 Last data filed at 07/21/2019 0600 Gross per 24 hour  Intake 1680 ml  Output 725 ml  Net 955 ml   Filed Weights   07/14/19 0500 07/18/19 0427 07/21/19 0342  Weight: 69.7 kg 70.5 kg 70.2 kg   Gen: 59 y.o. male in no distress Pulm: Nonlabored, clearing with humidified O2, PMV in place, 100%. CV: Regular rate and rhythm. No murmur, rub, or gallop. No JVD, no pitting dependent edema. GI: Abdomen soft, non-tender, non-distended, with normoactive bowel sounds.  Ext: Warm, no deformities Skin: No new rashes, lesions or ulcers on visualized skin. Right paratracheal fullness about stable. Neuro: Alert  and disoriented. Moves all extremities Psych: Conversant. Mood euthymic & affect congruent. Behavior is appropriate this morning during encounter, no lethargy noted.    Data Reviewed: I have personally reviewed following labs and imaging studies  CBC: Recent Labs  Lab 07/18/19 0420 07/20/19 0650  WBC 8.2 10.2  HGB 11.1* 11.0*  HCT 36.1* 37.2*  MCV 82.6 85.9  PLT 267 875   Basic Metabolic Panel: Recent Labs  Lab 07/15/19 0416 07/17/19 0723 07/18/19 0420 07/19/19 1040 07/20/19 0650  NA 138 139 137 139 139  K 3.6 4.0 3.9 4.0 4.4  CL 99 102 100 102 99  CO2 26 28 26 25 27   GLUCOSE 123* 126* 119* 110* 118*  BUN 13 17 16 16  22*  CREATININE 0.57* 0.59* 0.62 0.72 0.72  CALCIUM 9.5 9.6 9.7 9.9 9.9  MG 1.8 1.8 1.7  --   --   PHOS  --   --  4.4  --   --    GFR: Estimated Creatinine Clearance: 98.6 mL/min (by C-G formula based on SCr of 0.72 mg/dL). Liver Function Tests: Recent Labs  Lab 07/18/19 0420 07/19/19 1040 07/20/19 0650  AST 484* 204* 104*  ALT 440* 370* 273*  ALKPHOS 860* 660* 529*  BILITOT 2.3* 1.0 0.7  PROT 7.4 7.4 7.1  ALBUMIN 2.9* 3.0* 2.9*  No results for input(s): LIPASE, AMYLASE in the last 168 hours. No results for input(s): AMMONIA in the last 168 hours. Coagulation Profile: No results for input(s): INR, PROTIME in the last 168 hours. Cardiac Enzymes: No results for input(s): CKTOTAL, CKMB, CKMBINDEX, TROPONINI in the last 168 hours. BNP (last 3 results) No results for input(s): PROBNP in the last 8760 hours. HbA1C: No results for input(s): HGBA1C in the last 72 hours. CBG: Recent Labs  Lab 07/20/19 2055 07/20/19 2342 07/21/19 0346 07/21/19 0729 07/21/19 1132  GLUCAP 88 126* 108* 111* 139*   Lipid Profile: No results for input(s): CHOL, HDL, LDLCALC, TRIG, CHOLHDL, LDLDIRECT in the last 72 hours. Thyroid Function Tests: No results for input(s): TSH, T4TOTAL, FREET4, T3FREE, THYROIDAB in the last 72 hours. Anemia Panel: No results for  input(s): VITAMINB12, FOLATE, FERRITIN, TIBC, IRON, RETICCTPCT in the last 72 hours. Urine analysis:    Component Value Date/Time   COLORURINE YELLOW 06/12/2019 1230   APPEARANCEUR CLEAR 06/12/2019 1230   LABSPEC 1.042 (H) 06/12/2019 1230   PHURINE 5.0 06/12/2019 1230   GLUCOSEU NEGATIVE 06/12/2019 1230   HGBUR SMALL (A) 06/12/2019 1230   BILIRUBINUR NEGATIVE 06/12/2019 1230   KETONESUR NEGATIVE 06/12/2019 1230   PROTEINUR 30 (A) 06/12/2019 1230   UROBILINOGEN 0.2 07/02/2012 1607   NITRITE NEGATIVE 06/12/2019 1230   LEUKOCYTESUR NEGATIVE 06/12/2019 1230   No results found for this or any previous visit (from the past 240 hour(s)).    Radiology Studies: DG Swallowing Func-Speech Pathology  Result Date: 07/20/2019 Objective Swallowing Evaluation: Type of Study: MBS-Modified Barium Swallow Study  Patient Details Name: William Hevener. MRN: [id number] Date of Birth: 08-19-1960 Today's Date: 07/20/2019 Time: SLP Start Time (ACUTE ONLY): 1030 -SLP Stop Time (ACUTE ONLY): 1050 SLP Time Calculation (min) (ACUTE ONLY): 20 min Past Medical History: Past Medical History: Diagnosis Date . Alcohol abuse  . Alcoholic ([place])  . Back pain  . CAP (community acquired pneumonia) 11/26/2018 . COPD (chronic obstructive pulmonary disease) ([place])  . Empyema of right pleural space ([place]) 11/26/2018 . Heavy smoker  . Hep C w/ coma, chronic ([place])  . Loose, teeth  . Pancreatitis  . Poor dentition  . Tobacco abuse  Past Surgical History: Past Surgical History: Procedure Laterality Date . ARTERY REPAIR  2013  neck . BIOPSY  02/19/2019  Procedure: BIOPSY;  Surgeon: Ronnette Juniper, MD;  Location: [facility] ENDOSCOPY;  Service: Gastroenterology;; . ESOPHAGOGASTRODUODENOSCOPY (EGD) WITH PROPOFOL N/A 02/19/2019  Procedure: ESOPHAGOGASTRODUODENOSCOPY (EGD) WITH PROPOFOL;  Surgeon: Ronnette Juniper, MD;  Location: [place];  Service: Gastroenterology;  Laterality: N/A; . FOREIGN BODY REMOVAL  02/19/2019  Procedure: FOREIGN BODY REMOVAL;  Surgeon:  Ronnette Juniper, MD;  Location: [facility] ENDOSCOPY;  Service: Gastroenterology;; . KNEE SURGERY  2013  rt . MICROLARYNGOSCOPY N/A 07/31/2014  Procedure: MICROLARYNGOSCOPY WITH BIOPSY ;  Surgeon: Rozetta Nunnery, MD;  Location: [place];  Service: ENT;  Laterality: N/A; . NECK SURGERY  2013  cervical fusion- . VASCULAR SURGERY   . VIDEO ASSISTED THORACOSCOPY (VATS)/EMPYEMA Right 11/26/2018  Procedure: VIDEO ASSISTED THORACOSCOPY (VATS)/EMPYEMA;  Surgeon: Rexene Alberts, MD;  Location: [place];  Service: Thoracic;  Laterality: Right; [name] VIDEO BRONCHOSCOPY  11/26/2018  Procedure: Video Bronchoscopy;  Surgeon: Rexene Alberts, MD;  Location: [place];  Service: Thoracic;; HPI: 86 year old man with a history of alcohol use disorder, tobacco use disorder, HTN, and recent admission on 04/10/2019 for pancreatitis and hyponatremia who presents for evaluation of shortness of breath and neck swelling with  loss of airway and PEA arrest requiring emergent tracheostomy. Trach changed from 4 cuffless back to [place] 6 cuffed on 1/30 after RR event, and transferred back to ICU.  Pt has been difficult to wean from sedation and intermittently agitated.  Subjective: Pt was alert and cooperative Assessment / Plan / Recommendation CHL IP CLINICAL IMPRESSIONS 07/20/2019 Clinical Impression PMV donned at onset of MBS. Unfortunately pt did not have significant improvements from initial MBS 07/06/19. He continues to demonstrate laryngeal penetration, pharyngeal residue. Hyoid extends forward during swallow but elevation is depressed therefore epiglottis hits the pharynx but does inverts minimally. Puree on back of epiglottis fell into airway and was mostly ejected during swallow; honey thick was penetrated close to vocal cords during swallow to clear residue. Aspiration present and brief with thin. Reduced epiglottic inversion also led to maximum residue post swallow with puree and moderate with honey. He was not appropriate for  compensatory techniques to mitigate impairments. Recommend he continue npo status- SLP will continue to work with pt- he is just starting to have increased periods of wakefullness and periods of somewhat clearer cognition. Hopefully can avoid longer term alternate means of nutrition but uncertain of this.    SLP Visit Diagnosis Dysphagia, oropharyngeal phase (R13.12) Attention and concentration deficit following -- Frontal lobe and executive function deficit following -- Impact on safety and function Severe aspiration risk   CHL IP TREATMENT RECOMMENDATION 07/20/2019 Treatment Recommendations Therapy as outlined in treatment plan below   Prognosis 07/20/2019 Prognosis for Safe Diet Advancement Good Barriers to Reach Goals Cognitive deficits Barriers/Prognosis Comment -- CHL IP DIET RECOMMENDATION 07/20/2019 SLP Diet Recommendations NPO Liquid Administration via -- Medication Administration Via alternative means Compensations -- Postural Changes --   CHL IP OTHER RECOMMENDATIONS 07/20/2019 Recommended Consults -- Oral Care Recommendations Oral care QID Other Recommendations --   CHL IP FOLLOW UP RECOMMENDATIONS 07/20/2019 Follow up Recommendations Skilled Nursing facility   [facility] IP FREQUENCY AND DURATION 07/20/2019 Speech Therapy Frequency (ACUTE ONLY) min 2x/week Treatment Duration 2 weeks      CHL IP ORAL PHASE 07/20/2019 Oral Phase Impaired Oral - Pudding Teaspoon -- Oral - Pudding Cup -- Oral - Honey Teaspoon -- Oral - Honey Cup Delayed oral transit;Lingual pumping Oral - Nectar Teaspoon -- Oral - Nectar Cup Lingual pumping Oral - Nectar Straw -- Oral - Thin Teaspoon -- Oral - Thin Cup WFL Oral - Thin Straw -- Oral - Puree Reduced posterior propulsion Oral - Mech Soft -- Oral - Regular -- Oral - Multi-Consistency -- Oral - Pill -- Oral Phase - Comment --  CHL IP PHARYNGEAL PHASE 07/20/2019 Pharyngeal Phase Impaired Pharyngeal- Pudding Teaspoon -- Pharyngeal -- Pharyngeal- Pudding Cup -- Pharyngeal -- Pharyngeal- Honey  Teaspoon Penetration/Aspiration during swallow Pharyngeal -- Pharyngeal- Honey Cup NT Pharyngeal -- Pharyngeal- Nectar Teaspoon NT Pharyngeal -- Pharyngeal- Nectar Cup NT Pharyngeal -- Pharyngeal- Nectar Straw -- Pharyngeal -- Pharyngeal- Thin Teaspoon Penetration/Aspiration during swallow Pharyngeal Material enters airway, passes BELOW cords then ejected out Pharyngeal- Thin Cup NT Pharyngeal -- Pharyngeal- Thin Straw -- Pharyngeal -- Pharyngeal- Puree Pharyngeal residue - valleculae;Penetration/Aspiration during swallow Pharyngeal Material enters airway, remains ABOVE vocal cords and not ejected out Pharyngeal- Mechanical Soft -- Pharyngeal -- Pharyngeal- Regular -- Pharyngeal -- Pharyngeal- Multi-consistency -- Pharyngeal -- Pharyngeal- Pill -- Pharyngeal -- Pharyngeal Comment --  CHL IP CERVICAL ESOPHAGEAL PHASE 07/20/2019 Cervical Esophageal Phase Impaired Pudding Teaspoon -- Pudding Cup -- Honey Teaspoon -- Honey Cup -- Nectar Teaspoon -- Nectar Cup -- Nectar Straw -- Thin  Teaspoon -- Thin Cup -- Thin Straw -- Puree -- Mechanical Soft -- Regular -- Multi-consistency -- Pill -- Cervical Esophageal Comment -- [place] Siren 07/20/2019, 1:27 PM               Scheduled Meds: . bacitracin  1 application Topical BID  . bethanechol  5 mg Per Tube TID  . chlorhexidine gluconate (MEDLINE KIT)  15 mL Mouth Rinse BID  . Chlorhexidine Gluconate Cloth  6 each Topical Daily  . clonazepam  0.5 mg Per Tube Q8H  . cloNIDine  0.3 mg Per Tube Q6H  . famotidine  20 mg Per Tube BID  . folic acid  1 mg Per Tube Daily  . furosemide  40 mg Per Tube Daily  . guaiFENesin  15 mL Per Tube Q6H  . heparin injection (subcutaneous)  5,000 Units Subcutaneous Q8H  . insulin aspart  0-15 Units Subcutaneous Q4H  . losartan  25 mg Per Tube Daily  . mouth rinse  15 mL Mouth Rinse 10 times per day  . methadone  7.5 mg Per Tube Q8H  . metoprolol tartrate  75 mg Per Tube BID  . nicotine  14 mg Transdermal Daily  .  polyethylene glycol  17 g Per Tube Daily  . QUEtiapine  200 mg Per Tube BID  . sertraline  25 mg Per Tube Daily  . sodium chloride flush  10-40 mL Intracatheter Q12H  . thiamine  100 mg Per Tube Daily   Continuous Infusions: . sodium chloride Stopped (07/17/19 2053)  . feeding supplement (VITAL AF 1.2 CAL) 1,000 mL (07/21/19 1314)     LOS: 39 days   Time spent: 25 minutes.  Patrecia Pour, MD [facility] [email or web address] 07/21/2019, 2:08 PM

## 2019-07-21 NOTE — Progress Notes (Signed)
Occupational Therapy Treatment Patient Details Name: [name]. MRN: [id number] DOB: Dec 19, 1960 Today's Date: 07/21/2019    History of present illness Pt is a 59 y.o. male admitted 06/12/19 for SOB and neck swelling. Admitted to ICU with loss of airway and PEA arrest requiring emergent trach; has been difficult to wean off sedation. 1/15 R foot MRI suggestive of cellulitis, s/p aspiration. 1/30 return to ICU for hypercarbic respiratory failure and encephalopathy. 2/06 on trach collar 72 hrs and vent liberated; 2/09 orders to leave ICU for progressive care  PMH includes recent admission for pancreatitis (04/10/19), ETOH and tobacco use, HTN.   OT comments  Pt progressing towards acute OT goals. Alert, joking at times, but also confused, restless, and attempting to pull on lines. Redirection needed throughout session. Pt able to transfer OOB and squat-pivot to recliner with +2 max A. Almost able to stand fully upright but B knees buckling. With cueing and redirection pt engaging in grooming task once in recliner, decreased attention level noted. Chair alarm and restraints reapplied at end of session for safety, nursing notified.    Follow Up Recommendations  SNF;Supervision/Assistance - 24 hour    Equipment Recommendations  Other (comment)(TBD next venue of care)    Recommendations for Other Services      Precautions / Restrictions Precautions Precautions: Fall Precaution Comments: trach collar, bilat mittens; posey wrist, ankle, and waist restraints Restrictions Weight Bearing Restrictions: No       Mobility Bed Mobility Overal bed mobility: Needs Assistance Bed Mobility: Supine to Sit Rolling: Max assist Sidelying to sit: Max assist;HOB elevated Supine to sit: Min assist;+2 for safety/equipment;HOB elevated Sit to supine: Max assist;+2 for safety/equipment   General bed mobility comments: Cues to attend to task, pt distracted by rectal tube; minA for trunk elevation; +2  assist lines and pt safety  Transfers Overall transfer level: Needs assistance Equipment used: 2 person hand held assist Transfers: Sit to/from [facility] Sit to Stand: +2 physical assistance;Mod assist;+2 safety/equipment Stand pivot transfers: Max assist;+2 physical assistance       General transfer comment: Initial stand with maxA+2, pt with difficulty keeping bilateral HHA in place; stood again with bialteral knee blocking and mod-maxA+2 to pivot to recliner    Balance Overall balance assessment: Needs assistance Sitting-balance support: Bilateral upper extremity supported;Feet supported Sitting balance-Leahy Scale: Poor Sitting balance - Comments: R lateral lean supported sitting in bed chair position Postural control: Posterior lean Standing balance support: Bilateral upper extremity supported Standing balance-Leahy Scale: Zero Standing balance comment: difficulty achieving full stand but come close. B knees buckling. +2 assist to stand                           ADL either performed or assessed with clinical judgement   ADL Overall ADL's : Needs assistance/impaired     Grooming: Maximal assistance;Brushing hair;Sitting Grooming Details (indicate cue type and reason): hand over hand support to comb hair a bit with R UE  Upper Body Bathing: Maximal assistance;Sitting Upper Body Bathing Details (indicate cue type and reason): Hair wash cap applied during session. Pt once intiating hand to head to assist then needing cues to facilitate rubbing scalp. Pt able to reach up to top of head with R hand, weakness noted. Pt unable to reach overhead with LUE without support.              Toilet Transfer: Maximal assistance;+2 for physical assistance;Squat-pivot;Stand-pivot;[employer] technical [employer] Details (indicate  cue type and reason): simulated with EOB to recliner. B knees buckling, better with blocking provided. difficulty achieving full standing position.  +2 HHA assist and utilized gait belt and bed pad for hip extension. Ultimately completed as squat pivot.            General ADL Comments: Pt completed bed mobility, simualted toilet transfer, and engaged in 1 grooming/bathing task. Redirection needed throughout session.      Vision       Perception     Praxis      Cognition Arousal/Alertness: Awake/alert Behavior During Therapy: Restless;Impulsive Overall Cognitive Status: Difficult to assess Area of Impairment: Attention;Following commands;Awareness;Safety/judgement;Memory;Problem solving                   Current Attention Level: Focused Memory: Decreased short-term memory;Decreased recall of precautions Following Commands: Follows one step commands inconsistently;Follows one step commands with increased time Safety/Judgement: Decreased awareness of safety;Decreased awareness of deficits Awareness: Intellectual Problem Solving: Requires verbal cues;Requires tactile cues General Comments: Pt with jovial, joking demeanor at times. pt needing frequent redirection as he was attempting to pull on lines. Alert and talking this session. tangental responses. Initiating and verbalizing getting up to go to the bathroom "anybody in the bathroom?" "I need to go." Repeated reminders that he has both foley and rectal pouch. Pt grabbing at lines at times and needing tactile cues to redirect. Restraints reapplied and chair alarm set at end of session.         Exercises     Shoulder Instructions       General Comments SpO2 100% on 5L O2 via trach collar (28% FiO2); post-transfer BP 118/69, HR 70    Pertinent Vitals/ Pain       Pain Assessment: Faces Faces Pain Scale: Hurts little more Pain Location: unspecified, grimacing Pain Descriptors / Indicators: Discomfort Pain Intervention(s): Monitored during session  Home Living                                          Prior Functioning/Environment               Frequency  Min 2X/week        Progress Toward Goals  OT Goals(current goals can now be found in the care plan section)  Progress towards OT goals: Progressing toward goals  Acute Rehab OT Goals Patient Stated Goal: "I'm gonna go to the bathroom" OT Goal Formulation: Patient unable to participate in goal setting Time For Goal Achievement: 08/04/19 Potential to Achieve Goals: Fair ADL Goals Pt Will Perform Grooming: with min assist;sitting;bed level Pt Will Perform Upper Body Bathing: with min assist;sitting;bed level Pt Will Transfer to Toilet: with mod assist;with +2 assist;stand pivot transfer;bedside commode;squat pivot transfer Pt/caregiver will Perform Home Exercise Program: Increased strength;Both right and left upper extremity;With written HEP provided Additional ADL Goal #1: Patient will complete bed mobility with min assist and maintain sitting balance with min assist as precursor to ADLs. Additional ADL Goal #2: Patient will attend to tasks to complete 1-2 step commands in non distracting enviorment with no more than minimal cueing to maximize participation in ADLs.  Plan Discharge plan remains appropriate;Frequency remains appropriate    Co-evaluation    PT/OT/SLP Co-Evaluation/Treatment: Yes Reason for Co-Treatment: Complexity of the patient's impairments (multi-system involvement);Necessary to address cognition/behavior during functional activity;For patient/therapist safety;To address functional/ADL transfers PT goals addressed during session: Mobility/safety  with mobility;Balance OT goals addressed during session: ADL's and self-care;Strengthening/ROM      AM-PAC OT "6 Clicks" Daily Activity     Outcome Measure   Help from another person eating meals?: Total Help from another person taking care of personal grooming?: A Lot Help from another person toileting, which includes using toliet, bedpan, or urinal?: Total Help from another person bathing (including  washing, rinsing, drying)?: Total Help from another person to put on and taking off regular upper body clothing?: Total Help from another person to put on and taking off regular lower body clothing?: Total 6 Click Score: 7    End of Session Equipment Utilized During Treatment: Oxygen  OT Visit Diagnosis: Other abnormalities of gait and mobility (R26.89);Muscle weakness (generalized) (M62.81);Other symptoms and signs involving cognitive function   Activity Tolerance Patient tolerated treatment well   Patient Left with call bell/phone within reach;with restraints reapplied;with nursing/sitter in room;in chair;with chair alarm set   Nurse Communication Mobility status;Other (comment)(restraints reapplied, pt pulling at lines )        Time: [id number] OT Time Calculation (min): 44 min  Charges: OT General Charges $OT Visit: 1 Visit OT Treatments $Self Care/Home Management : 23-37 mins  Tyrone Schimke, OT Acute Rehabilitation Services Pager: [phone] Office: [phone]    Hortencia Pilar 07/21/2019, 1:20 PM

## 2019-07-22 LAB — GLUCOSE, CAPILLARY
Glucose-Capillary: 96 mg/dL (ref 70–99)
Glucose-Capillary: 90 mg/dL (ref 70–99)
Glucose-Capillary: 107 mg/dL — ABNORMAL HIGH (ref 70–99)
Glucose-Capillary: 119 mg/dL — ABNORMAL HIGH (ref 70–99)
Glucose-Capillary: 95 mg/dL (ref 70–99)

## 2019-07-22 NOTE — Progress Notes (Signed)
PROGRESS NOTE  [name].  [id number] DOB: [birth year] DOA: 06/12/2019 PCP: Alvester Chou, NP   Brief Narrative: [name]. is a 59 y.o. male with a history of alcohol abuse, hyponatremia, and admission for acute pancreatitis in November 2020 who presented 06/12/2019 with neck swelling found to have cellulitis with acute airway compromise resulting in PEA arrest requiring emergent tracheostomy.  Significant events: 1/4 : Lost airway and PEA arrest on, requiring emergent tracheostomy and ROSC obtained 1/5: 4/4 Blood cultures growing H.Influenza , narrowed to Unasyn 1/5 > 1/14: Continued on antibiotics without any significant improvement  1/14: New right foot swelling for which MRI obtained due to concerns for seeding of cellulitis from neck 1/15: MRI with synovitis and subcutaneous edema suggestive of cellulitis; orthopedics consulted for right ankle aspiration - aspirate with positive crystals suggestive of gout - patient started on NSAIDs and steroids 1/16 Started on dilaudid, D/C fentanyl  1/17: Patient agitated early AM, q2h prn versed prescribed 1/18: Hb 5.2, transfused 2u pRBC; started methadone  1/19: Klonopin increased to tid and metoprolol added for SVTs 1/20: precedex gtt discontinued overnight for concerns of bradycardia  1/21: clonidine taper, 24hr trach collar 1/22: continuing trach collar 1/23: last day of unasyn for cellulitis, steroid taper and colchicine for gout, continuing trach collar  1/24: 72 hours of trach collar 1/25 changed to 4 cuffless 1/26 sedation cut back 1/30 returned to ICU for hypercarbic respiratory failure and encephalopathy 2/06 ventilator liberated. Has been TC for 72 hours. 2/09 off precedex, transfer to progressive care  2/10 Hospitalists assume care 2/11 Failed swallow study  Assessment & Plan: Principal Problem:   Neck infection Active Problems:   Alcohol abuse   Poor dentition   Septic shock (HCC)   Laryngeal edema  Multifocal pneumonia   Airway compromise   Chronic hepatitis C without hepatic coma (HCC)   Acute respiratory failure with hypoxemia (HCC)   Cardiac arrest, cause unspecified ([place])   Haemophilis influenzae bacteremia    Malnutrition of moderate degree   Atelectasis   Tracheostomy care ([place])   Acute metabolic encephalopathy   Hypercarbia   Tracheostomy status (HCC)   Pressure injury of skin   Tracheostomy dependent respiratory failure -Airway compromise with neck CTA-thickening epiglottis with pharyngeal and supraglottic wall edema -Multifocal pneumonia by CT -Given antibiotic and racemic epinephrine with Decadron per ENT recommendation -History of empyema in June 2020 -Patient intubated and mechanically ventilated-required tracheostomy due to failure to liberate from the vent -Tracheostomy care per PCCM -Supportive care`  Cellulitis of neck Complicated by respiratory compromise and H influenza bacteremia Antibiotic completed  Acute metabolic encephalopathy Etiology includes sepsis and delirium with history of alcohol abuse and opiate dependence Continue supportive care with clonidine and methadone tapering as tolerated    Compromised airway due to neck cellulitis with bacteremia from H influenza s/p tracheostomy 1/4. - PCCM to continue tracheostomy care, remains on trach collar.   Acute hypoxemic and hypercarbic respiratory failure:  - Wean O2 as tolerated.   Tobacco abuse, history of COPD. - Nicotine patch   PSVT:  - Continue beta blocker, optimize electrolytes.   HTN:  - Continue cozaar, lasix, lopressor  Elevated LFTs Etiology unclear Improving Outpatient follow-up RN Pressure Injury Documentation:  DVT prophylaxis: Lovenox Code Status: Full Family Communication: None at bedside Disposition Plan: SNF vs. LTACH.  Consultants:   PCCM primary through 07/18/2019  Infectious disease  ENT  Orthopedic surgery  Procedures:  Tracheostomy - placed  1/4 Peripheral IV in Right  Forearm - placed 1/9 Peripheral IV in Left Forearm - placed 1/9 PICC line double lumen - placed 1/15 - removed 2/12  Antimicrobials: Bactrim 1/4 Azithromycin 1/4  Vancomycin 1/4 Zosyn  1/4 one dose Clindamycin 1/4 one dose Unasyn 1/5 >> 1/23  Ceftriaxone >> 1/29 for klebsiella  Subjective: Resting comfortably no agitation this morning.  Calm this morning  Objective: Vitals:   07/22/19 0743 07/22/19 0833 07/22/19 1108 07/22/19 1208  BP:  135/90  115/74  Pulse: 68  61 (!) 59  Resp: 17  17 20   Temp:  98.1 F (36.7 C)  98.3 F (36.8 C)  TempSrc:  Oral  Oral  SpO2: 100%  100% 100%  Weight:      Height:        Intake/Output Summary (Last 24 hours) at 07/22/2019 1356 Last data filed at 07/22/2019 0500 Gross per 24 hour  Intake 1493.67 ml  Output --  Net 1493.67 ml   Filed Weights   07/14/19 0500 07/18/19 0427 07/21/19 0342  Weight: 69.7 kg 70.5 kg 70.2 kg    Gen: NAD Pulm: Clear to auscultation anteriorly HEENT: Tracheostomy noted CV: Regular rate and rhythm. No murmur, rub, or gallop. No JVD, no pitting dependent edema. GI: Abdomen soft, non-tender, non-distended, with normoactive bowel sounds.  Ext: Warm, no deformities Neuro: Alert and disoriented. Moves all extremities   Data Reviewed: I have personally reviewed following labs and imaging studies  CBC: Recent Labs  Lab 07/18/19 0420 07/20/19 0650  WBC 8.2 10.2  HGB 11.1* 11.0*  HCT 36.1* 37.2*  MCV 82.6 85.9  PLT 267 825   Basic Metabolic Panel: Recent Labs  Lab 07/17/19 0723 07/18/19 0420 07/19/19 1040 07/20/19 0650  NA 139 137 139 139  K 4.0 3.9 4.0 4.4  CL 102 100 102 99  CO2 28 26 25 27   GLUCOSE 126* 119* 110* 118*  BUN 17 16 16  22*  CREATININE 0.59* 0.62 0.72 0.72  CALCIUM 9.6 9.7 9.9 9.9  MG 1.8 1.7  --   --   PHOS  --  4.4  --   --    GFR: Estimated Creatinine Clearance: 98.6 mL/min (by C-G formula based on SCr of 0.72 mg/dL). Liver Function  Tests: Recent Labs  Lab 07/18/19 0420 07/19/19 1040 07/20/19 0650  AST 484* 204* 104*  ALT 440* 370* 273*  ALKPHOS 860* 660* 529*  BILITOT 2.3* 1.0 0.7  PROT 7.4 7.4 7.1  ALBUMIN 2.9* 3.0* 2.9*   No results for input(s): LIPASE, AMYLASE in the last 168 hours. No results for input(s): AMMONIA in the last 168 hours. Coagulation Profile: No results for input(s): INR, PROTIME in the last 168 hours. Cardiac Enzymes: No results for input(s): CKTOTAL, CKMB, CKMBINDEX, TROPONINI in the last 168 hours. BNP (last 3 results) No results for input(s): PROBNP in the last 8760 hours. HbA1C: No results for input(s): HGBA1C in the last 72 hours. CBG: Recent Labs  Lab 07/21/19 1951 07/21/19 2242 07/22/19 0305 07/22/19 0922 07/22/19 1207  GLUCAP 99 89 96 90 107*   Lipid Profile: No results for input(s): CHOL, HDL, LDLCALC, TRIG, CHOLHDL, LDLDIRECT in the last 72 hours. Thyroid Function Tests: No results for input(s): TSH, T4TOTAL, FREET4, T3FREE, THYROIDAB in the last 72 hours. Anemia Panel: No results for input(s): VITAMINB12, FOLATE, FERRITIN, TIBC, IRON, RETICCTPCT in the last 72 hours. Urine analysis:    Component Value Date/Time   COLORURINE YELLOW 06/12/2019 1230   APPEARANCEUR CLEAR 06/12/2019 1230   LABSPEC 1.042 (H) 06/12/2019  1230   PHURINE 5.0 06/12/2019 1230   GLUCOSEU NEGATIVE 06/12/2019 1230   HGBUR SMALL (A) 06/12/2019 1230   BILIRUBINUR NEGATIVE 06/12/2019 1230   KETONESUR NEGATIVE 06/12/2019 1230   PROTEINUR 30 (A) 06/12/2019 1230   UROBILINOGEN 0.2 07/02/2012 1607   NITRITE NEGATIVE 06/12/2019 1230   LEUKOCYTESUR NEGATIVE 06/12/2019 1230   No results found for this or any previous visit (from the past 240 hour(s)).    Radiology Studies: DG Abd 1 View  Result Date: 07/21/2019 CLINICAL DATA:  Dobbhoff tube placement. EXAM: ABDOMEN - 1 VIEW COMPARISON:  July 07, 2019 FINDINGS: Dobbhoff tube is identified distal tip in the mid stomach. There is no small bowel  obstruction. IMPRESSION: Dobbhoff tube distal tip in the mid stomach. Electronically Signed   By: Abelardo Diesel M.D.   On: 07/21/2019 18:51    Scheduled Meds: . bacitracin  1 application Topical BID  . bethanechol  5 mg Per Tube TID  . chlorhexidine gluconate (MEDLINE KIT)  15 mL Mouth Rinse BID  . Chlorhexidine Gluconate Cloth  6 each Topical Daily  . clonazepam  1 mg Per Tube Q8H  . cloNIDine  0.3 mg Per Tube Q6H  . famotidine  20 mg Per Tube BID  . folic acid  1 mg Per Tube Daily  . furosemide  40 mg Per Tube Daily  . guaiFENesin  15 mL Per Tube Q6H  . heparin injection (subcutaneous)  5,000 Units Subcutaneous Q8H  . insulin aspart  0-15 Units Subcutaneous Q4H  . losartan  25 mg Per Tube Daily  . mouth rinse  15 mL Mouth Rinse 10 times per day  . methadone  10 mg Per Tube Q8H  . metoprolol tartrate  75 mg Per Tube BID  . nicotine  14 mg Transdermal Daily  . polyethylene glycol  17 g Per Tube Daily  . QUEtiapine  200 mg Per Tube BID  . sertraline  25 mg Per Tube Daily  . sodium chloride flush  10-40 mL Intracatheter Q12H  . thiamine  100 mg Per Tube Daily   Continuous Infusions: . sodium chloride Stopped (07/17/19 2053)  . feeding supplement (VITAL AF 1.2 CAL) 1,000 mL (07/22/19 1233)     LOS: 40 days   Time spent: 35 minutes.  Benito Mccreedy, MD [facility] [email or web address] 07/22/2019, 1:56 PM

## 2019-07-23 ENCOUNTER — Inpatient Hospital Stay (HOSPITAL_COMMUNITY): Payer: Medicaid Other

## 2019-07-23 LAB — COMPREHENSIVE METABOLIC PANEL
ALT: 177 U/L — ABNORMAL HIGH (ref 0–44)
AST: 82 U/L — ABNORMAL HIGH (ref 15–41)
Albumin: 3.1 g/dL — ABNORMAL LOW (ref 3.5–5.0)
Alkaline Phosphatase: 350 U/L — ABNORMAL HIGH (ref 38–126)
Anion gap: 15 (ref 5–15)
BUN: 22 mg/dL — ABNORMAL HIGH (ref 6–20)
CO2: 23 mmol/L (ref 22–32)
Calcium: 10 mg/dL (ref 8.9–10.3)
Chloride: 99 mmol/L (ref 98–111)
Creatinine, Ser: 0.6 mg/dL — ABNORMAL LOW (ref 0.61–1.24)
GFR calc Af Amer: >60 mL/min (ref >60)
GFR calc non Af Amer: >60 mL/min (ref >60)
Glucose, Bld: 117 mg/dL — ABNORMAL HIGH (ref 70–99)
Potassium: 4.5 mmol/L (ref 3.5–5.1)
Sodium: 137 mmol/L (ref 135–145)
Total Bilirubin: 0.5 mg/dL (ref 0.3–1.2)
Total Protein: 7.4 g/dL (ref 6.5–8.1)

## 2019-07-23 LAB — CBC
HCT: 39.6 % (ref 39.0–52.0)
Hemoglobin: 11.5 g/dL — ABNORMAL LOW (ref 13.0–17.0)
MCH: 25.3 pg — ABNORMAL LOW (ref 26.0–34.0)
MCHC: 29 g/dL — ABNORMAL LOW (ref 30.0–36.0)
MCV: 87 fL (ref 80.0–100.0)
Platelets: 314 10*3/uL (ref 150–400)
RBC: 4.55 MIL/uL (ref 4.22–5.81)
RDW: 19.7 % — ABNORMAL HIGH (ref 11.5–15.5)
WBC: 8.3 10*3/uL (ref 4.0–10.5)
nRBC: 0 % (ref 0.0–0.2)

## 2019-07-23 LAB — GLUCOSE, CAPILLARY
Glucose-Capillary: 109 mg/dL — ABNORMAL HIGH (ref 70–99)
Glucose-Capillary: 109 mg/dL — ABNORMAL HIGH (ref 70–99)
Glucose-Capillary: 113 mg/dL — ABNORMAL HIGH (ref 70–99)
Glucose-Capillary: 113 mg/dL — ABNORMAL HIGH (ref 70–99)
Glucose-Capillary: 120 mg/dL — ABNORMAL HIGH (ref 70–99)
Glucose-Capillary: 129 mg/dL — ABNORMAL HIGH (ref 70–99)
Glucose-Capillary: 98 mg/dL (ref 70–99)

## 2019-07-23 MED ORDER — CLONAZEPAM 0.5 MG PO TBDP
0.5000 mg | ORAL_TABLET | Freq: Three times a day (TID) | ORAL | Status: DC
  Administered 2019-07-23 – 2019-07-25 (×5): 0.5 mg
  Filled 2019-07-23 (×5): qty 1

## 2019-07-23 MED ORDER — METHADONE HCL 5 MG PO TABS
5.0000 mg | ORAL_TABLET | Freq: Three times a day (TID) | ORAL | Status: DC
  Administered 2019-07-23 – 2019-07-25 (×5): 5 mg
  Filled 2019-07-23 (×5): qty 1

## 2019-07-23 MED ORDER — SODIUM CHLORIDE 0.9 % IV BOLUS: 250.0000 mL | Freq: Once | INTRAVENOUS | Status: DC

## 2019-07-23 NOTE — Progress Notes (Signed)
Persistent low blood pressures-losartan and diltiazem discontinued and 250 normal saline ordered

## 2019-07-23 NOTE — Progress Notes (Signed)
PROGRESS NOTE  [name].  XGZ:[id number] DOB: [birth year] DOA: 06/12/2019 PCP: Alvester Chou, NP   Brief Narrative: [name]. is a 59 y.o. male with a history of alcohol abuse, hyponatremia, and admission for acute pancreatitis in November 2020 who presented 06/12/2019 with neck swelling found to have cellulitis with acute airway compromise resulting in PEA arrest requiring emergent tracheostomy.  Significant events: 1/4 : Lost airway and PEA arrest on, requiring emergent tracheostomy and ROSC obtained 1/5: 4/4 Blood cultures growing H.Influenza , narrowed to Unasyn 1/5 > 1/14: Continued on antibiotics without any significant improvement  1/14: New right foot swelling for which MRI obtained due to concerns for seeding of cellulitis from neck 1/15: MRI with synovitis and subcutaneous edema suggestive of cellulitis; orthopedics consulted for right ankle aspiration - aspirate with positive crystals suggestive of gout - patient started on NSAIDs and steroids 1/16 Started on dilaudid, D/C fentanyl  1/17: Patient agitated early AM, q2h prn versed prescribed 1/18: Hb 5.2, transfused 2u pRBC; started methadone  1/19: Klonopin increased to tid and metoprolol added for SVTs 1/20: precedex gtt discontinued overnight for concerns of bradycardia  1/21: clonidine taper, 24hr trach collar 1/22: continuing trach collar 1/23: last day of unasyn for cellulitis, steroid taper and colchicine for gout, continuing trach collar  1/24: 72 hours of trach collar 1/25 changed to 4 cuffless 1/26 sedation cut back 1/30 returned to ICU for hypercarbic respiratory failure and encephalopathy 2/06 ventilator liberated. Has been TC for 72 hours. 2/09 off precedex, transfer to progressive care  2/10 Hospitalists assume care 2/11 Failed swallow study  Assessment & Plan: Principal Problem:   Neck infection Active Problems:   Alcohol abuse   Poor dentition   Septic shock (HCC)   Laryngeal edema  Multifocal pneumonia   Airway compromise   Chronic hepatitis C without hepatic coma (HCC)   Acute respiratory failure with hypoxemia (HCC)   Cardiac arrest, cause unspecified ([place])   Haemophilis influenzae bacteremia    Malnutrition of moderate degree   Atelectasis   Tracheostomy care ([place])   Acute metabolic encephalopathy   Hypercarbia   Tracheostomy status (HCC)   Pressure injury of skin   Tracheostomy dependent respiratory failure -Airway compromise with neck CTA-thickening epiglottis with pharyngeal and supraglottic wall edema -Multifocal pneumonia by CT -Given antibiotic and racemic epinephrine with Decadron per ENT recommendation -History of empyema in June 2020 -Patient intubated and mechanically ventilated-required tracheostomy due to failure to liberate from the vent -Tracheostomy care per PCCM -Supportive care`  Cellulitis of neck Complicated by respiratory compromise and H influenza bacteremia Antibiotic completed  Acute metabolic encephalopathy Etiology includes sepsis and delirium with history of alcohol abuse and opiate dependence Continue supportive care  Methadone taper down with clonazepam 08/20/2019   Compromised airway due to neck cellulitis with bacteremia from H influenza s/p tracheostomy 1/4. - PCCM to continue tracheostomy care, remains on trach collar.   Acute hypoxemic and hypercarbic respiratory failure:  - Wean O2 as tolerated.   Possible abdominal pain Per nursing historically-KUB ordered   Tobacco abuse, history of COPD. - Nicotine patch   PSVT:  - Continue beta blocker, optimize electrolytes.   HTN:  - Continue cozaar, lasix, lopressor  Elevated LFTs Etiology unclear Improving Outpatient follow-up RN Pressure Injury Documentation:  DVT prophylaxis: Lovenox Code Status: Full Family Communication: None at bedside Disposition Plan: SNF vs. LTACH.  Consultants:   PCCM primary through 07/18/2019  Infectious  disease  ENT  Orthopedic surgery  Procedures:  Tracheostomy - placed 1/4 Peripheral IV in Right Forearm - placed 1/9 Peripheral IV in Left Forearm - placed 1/9 PICC line double lumen - placed 1/15 - removed 2/12  Antimicrobials: Bactrim 1/4 Azithromycin 1/4  Vancomycin 1/4 Zosyn  1/4 one dose Clindamycin 1/4 one dose Unasyn 1/5 >> 1/23  Ceftriaxone >> 1/29 for klebsiella  Subjective: Patient alert but more lethargic earlier this morning compared to yesterday and clonazepam and methadone taper down.  Nursing noted possible abdominal pain KUB ordered  Objective: Vitals:   07/23/19 0900 07/23/19 1000 07/23/19 1111 07/23/19 1131  BP: (!) 87/54 (!) 99/56  (!) 111/53  Pulse: (!) 56 (!) 55 (!) 56 (!) 54  Resp: _0 Temp:    97.8 F (36.6 C)  TempSrc:    Oral  SpO2: 99% 97% 99% 100%  Weight:      Height:        Intake/Output Summary (Last 24 hours) at 07/23/2019 1309 Last data filed at 07/23/2019 0900 Gross per 24 hour  Intake 10 ml  Output 1975 ml  Net -1965 ml   Filed Weights   07/14/19 0500 07/18/19 0427 07/21/19 0342  Weight: 69.7 kg 70.5 kg 70.2 kg    Gen: NAD Pulm: Clear to auscultation anteriorly HEENT: Tracheostomy noted CV: Regular rate and rhythm. No murmur, rub, or gallop. No JVD, no pitting dependent edema. GI: Abdomen soft, non-tender, non-distended, with normoactive bowel sounds.  Ext: Warm, no deformities Neuro: Alert and disoriented. Moves all extremities   Data Reviewed: I have personally reviewed following labs and imaging studies  CBC: Recent Labs  Lab 07/18/19 0420 07/20/19 0650 07/23/19 0225  WBC 8.2 10.2 8.3  HGB 11.1* 11.0* 11.5*  HCT 36.1* 37.2* 39.6  MCV 82.6 85.9 87.0  PLT 267 273 161   Basic Metabolic Panel: Recent Labs  Lab 07/17/19 0723 07/18/19 0420 07/19/19 1040 07/20/19 0650 07/23/19 0225  NA 139 137 139 139 137  K 4.0 3.9 4.0 4.4 4.5  CL 102 100 102 99 99  CO2 _1 GLUCOSE 126* 119* 110*  118* 117*  BUN _2 22* 22*  CREATININE 0.59* 0.62 0.72 0.72 0.60*  CALCIUM 9.6 9.7 9.9 9.9 10.0  MG 1.8 1.7  --   --   --   PHOS  --  4.4  --   --   --    GFR: Estimated Creatinine Clearance: 98.6 mL/min (A) (by C-G formula based on SCr of 0.6 mg/dL (L)). Liver Function Tests: Recent Labs  Lab 07/18/19 0420 07/19/19 1040 07/20/19 0650 07/23/19 0225  AST 484* 204* 104* 82*  ALT 440* 370* 273* 177*  ALKPHOS 860* 660* 529* 350*  BILITOT 2.3* 1.0 0.7 0.5  PROT 7.4 7.4 7.1 7.4  ALBUMIN 2.9* 3.0* 2.9* 3.1*   No results for input(s): LIPASE, AMYLASE in the last 168 hours. No results for input(s): AMMONIA in the last 168 hours. Coagulation Profile: No results for input(s): INR, PROTIME in the last 168 hours. Cardiac Enzymes: No results for input(s): CKTOTAL, CKMB, CKMBINDEX, TROPONINI in the last 168 hours. BNP (last 3 results) No results for input(s): PROBNP in the last 8760 hours. HbA1C: No results for input(s): HGBA1C in the last 72 hours. CBG: Recent Labs  Lab 07/22/19 1955 07/23/19 0019 07/23/19 0425 07/23/19 0726 07/23/19 1132  GLUCAP 95 109* 113* 129* 120*   Lipid Profile: No results for input(s): CHOL, HDL, LDLCALC, TRIG, CHOLHDL, LDLDIRECT in the last 72 hours.  Thyroid Function Tests: No results for input(s): TSH, T4TOTAL, FREET4, T3FREE, THYROIDAB in the last 72 hours. Anemia Panel: No results for input(s): VITAMINB12, FOLATE, FERRITIN, TIBC, IRON, RETICCTPCT in the last 72 hours. Urine analysis:    Component Value Date/Time   COLORURINE YELLOW 06/12/2019 1230   APPEARANCEUR CLEAR 06/12/2019 1230   LABSPEC 1.042 (H) 06/12/2019 1230   PHURINE 5.0 06/12/2019 1230   GLUCOSEU NEGATIVE 06/12/2019 1230   HGBUR SMALL (A) 06/12/2019 1230   BILIRUBINUR NEGATIVE 06/12/2019 1230   KETONESUR NEGATIVE 06/12/2019 1230   PROTEINUR 30 (A) 06/12/2019 1230   UROBILINOGEN 0.2 07/02/2012 1607   NITRITE NEGATIVE 06/12/2019 1230   LEUKOCYTESUR NEGATIVE 06/12/2019 1230    No results found for this or any previous visit (from the past 240 hour(s)).    Radiology Studies: DG Abd 1 View  Result Date: 07/21/2019 CLINICAL DATA:  Dobbhoff tube placement. EXAM: ABDOMEN - 1 VIEW COMPARISON:  July 07, 2019 FINDINGS: Dobbhoff tube is identified distal tip in the mid stomach. There is no small bowel obstruction. IMPRESSION: Dobbhoff tube distal tip in the mid stomach. Electronically Signed   By: Abelardo Diesel M.D.   On: 07/21/2019 18:51    Scheduled Meds: . bacitracin  1 application Topical BID  . bethanechol  5 mg Per Tube TID  . chlorhexidine gluconate (MEDLINE KIT)  15 mL Mouth Rinse BID  . Chlorhexidine Gluconate Cloth  6 each Topical Daily  . clonazepam  0.5 mg Per Tube Q8H  . cloNIDine  0.3 mg Per Tube Q6H  . famotidine  20 mg Per Tube BID  . folic acid  1 mg Per Tube Daily  . furosemide  40 mg Per Tube Daily  . guaiFENesin  15 mL Per Tube Q6H  . heparin injection (subcutaneous)  5,000 Units Subcutaneous Q8H  . insulin aspart  0-15 Units Subcutaneous Q4H  . losartan  25 mg Per Tube Daily  . mouth rinse  15 mL Mouth Rinse 10 times per day  . methadone  5 mg Per Tube Q8H  . metoprolol tartrate  75 mg Per Tube BID  . nicotine  14 mg Transdermal Daily  . polyethylene glycol  17 g Per Tube Daily  . QUEtiapine  200 mg Per Tube BID  . sertraline  25 mg Per Tube Daily  . sodium chloride flush  10-40 mL Intracatheter Q12H  . thiamine  100 mg Per Tube Daily   Continuous Infusions: . sodium chloride Stopped (07/17/19 2053)  . feeding supplement (VITAL AF 1.2 CAL) 1,000 mL (07/22/19 1233)     LOS: 41 days   Time spent: 25 minutes.  Benito Mccreedy, MD [facility] [email or web address] 07/23/2019, 1:09 PM

## 2019-07-23 NOTE — Progress Notes (Signed)
   Vital Signs MEWS/VS Documentation      07/23/2019 0823 07/23/2019 0900 07/23/2019 0950 07/23/2019 1000   MEWS Score:  0  2  2  2    MEWS Score Color:  Green  Yellow  Yellow  Yellow   Resp:  17  13  --  13   Pulse:  (!) 57  (!) 56  --  (!) 55   BP:  112/61  (!) 87/54  --  (!) 99/56   O2 Device:  Tracheostomy Collar  --  --  --   O2 Flow Rate (L/min):  5 L/min  --  --  --   FiO2 (%):  28 %  --  --  --      Patient is lathagic this morning responding to voice/pain, but unable to keep eyes opened. Held morning Metoprolol due to low readings  BP 87/54 and HR 65. Patient is also tender in his abdomen this morning as evident by facial grimace which is a new finding today.Text paged Dr Vista Lawman. Will continue to monitor patient and await orders.      [name] 07/23/2019,10:29 AM

## 2019-07-24 ENCOUNTER — Inpatient Hospital Stay (HOSPITAL_COMMUNITY): Payer: Medicaid Other

## 2019-07-24 LAB — GLUCOSE, CAPILLARY
Glucose-Capillary: 112 mg/dL — ABNORMAL HIGH (ref 70–99)
Glucose-Capillary: 115 mg/dL — ABNORMAL HIGH (ref 70–99)
Glucose-Capillary: 117 mg/dL — ABNORMAL HIGH (ref 70–99)
Glucose-Capillary: 91 mg/dL (ref 70–99)
Glucose-Capillary: 106 mg/dL — ABNORMAL HIGH (ref 70–99)

## 2019-07-24 MED ORDER — CLONIDINE HCL 0.1 MG PO TABS
0.1000 mg | ORAL_TABLET | Freq: Four times a day (QID) | ORAL | Status: DC
  Administered 2019-07-25: 0.1 mg
  Filled 2019-07-24: qty 1

## 2019-07-24 MED ORDER — ADULT MULTIVITAMIN LIQUID CH
15.0000 mL | Freq: Every day | ORAL | Status: DC
  Administered 2019-07-24 – 2019-08-04 (×10): 15 mL via ORAL
  Filled 2019-07-24 (×12): qty 15

## 2019-07-24 MED ORDER — DOXYCYCLINE HYCLATE 100 MG PO TABS
100.0000 mg | ORAL_TABLET | Freq: Two times a day (BID) | ORAL | Status: AC
  Administered 2019-07-24 – 2019-07-28 (×9): 100 mg via ORAL
  Filled 2019-07-24 (×9): qty 1

## 2019-07-24 NOTE — Progress Notes (Addendum)
PROGRESS NOTE    [name].  LID:[id number]  DOB: [birth year]  PCP: Alvester Chou, NP 59 y.o. male with a history of alcohol/tobacco abuse, COPD, hyponatremia,empyema in June 2020 and admission for acute pancreatitis in November 8875 complicated by respiratory failure requiring tracheostomy at that time presented on 06/12/2019 with neck swelling found to have cellulitis with acute airway compromise resulting in PEA arrest requiring emergent tracheostomy. Neck CTA-thickening epiglottis with pharyngeal and supraglottic wall edema,also multifocal pneumonia by CT-recieved empiric antibiotics, racemic epinephrine with Decadron per ENT recommendation. Blood cultures grew H.Influenza 4/4 , abx changed to Unasyn 1/5 >1/14 without any significant improvement. New right foot swelling noted on 1/14- MRI obtained to rule out infectious emboli-it showed synovitis and subcutaneous edema suggestive of cellulitis; orthopedics consulted for right ankle aspiration - aspirate with positive crystals suggestive of gout - patient started on NSAIDs and steroids. Pain meds titrated ( Started on dilaudid, D/C fentanyl--1/18 methadone). Patient completed Unasyn course on 1/23.  HC complicated by delirium/agitation requiring versed/klonapin/precedex gtt while in ICU, anemia requiring transfusion, elevated LFTs, and paroxysmal SVTs. On 1/17: Patient agitated early AM, q2h prn versed prescribed. Hb 5.2, transfused 2u pRBC; started methadone . On 1/19: Klonopin increased to tid and metoprolol added for SVTs but patient had subsequent bradycardia, clonidine tapered,precedex gtt discontinued . 1/21-1/24--placed on trach collar, then changed to 4 cuffless and sedation cut back slowly. However, on 1/30 patient returned to ICU for hypercarbic respiratory failure and encephalopathy--finally liberated from ventilator on 2/06- takenoff precedex and transferred to progressive care/ [facility] on 2/09. PCCM to continue tracheostomy care, remains  on trach collar. 2/11 Failed swallow study, PICC line removed 2/12. Clonazepam/methadone dosing being tapered down. Seen by PCCM, ID, ENT and ortho during [place].    Subjective: Patient appears slightly anxious/restless.  Mental status improved and more awake/alert today per nursing staff.  Cord truck still in place.  Wife at bedside who reports patient has history of dysphagia, suspected to have esophageal stenosis and was supposed to follow-up with GI for "stretching" as outpatient.  Losartan/diltiazem discontinued yesterday and patient given 250 normal saline bolus for hypotension.  Objective: Vitals:   07/24/19 0425 07/24/19 0608 07/24/19 0753 07/24/19 0816  BP: (!) 130/55 (!) 104/35 (!) 118/54 135/61  Pulse: 78  86 85  Resp: 19  17 17   Temp: 98.6 F (37 C)   99.1 F (37.3 C)  TempSrc: Oral   Oral  SpO2: 99%  99% 96%  Weight:      Height:        Intake/Output Summary (Last 24 hours) at 07/24/2019 0948 Last data filed at 07/24/2019 0545 Gross per 24 hour  Intake 20 ml  Output 400 ml  Net -380 ml   Filed Weights   07/14/19 0500 07/18/19 0427 07/21/19 0342  Weight: 69.7 kg 70.5 kg 70.2 kg    Physical Examination:  General exam: Appears restless but awake and communicating.  Core track in place. Respiratory system: Clear to auscultation. Respiratory effort normal. Cardiovascular system: S1 & S2 heard, RRR. No JVD, murmurs. No pedal edema. Gastrointestinal system: Abdomen is nondistended, soft and nontender. Normal BS Central nervous system: Alert and oriented x2. No new focal neurological deficits. Extremities: No contractures, edema or joint deformities.  Skin: No rashes, lesions or ulcers Psychiatry: Judgement and insight appear normal. Mood & affect appropriate.   Data Reviewed: I have personally reviewed following labs and imaging studies  CBC: Recent Labs  Lab 07/18/19 0420 07/20/19 0650 07/23/19 0225  WBC 8.2 10.2 8.3  HGB 11.1* 11.0* 11.5*  HCT 36.1* 37.2* 39.6    MCV 82.6 85.9 87.0  PLT 267 273 790   Basic Metabolic Panel: Recent Labs  Lab 07/18/19 0420 07/19/19 1040 07/20/19 0650 07/23/19 0225  NA 137 139 139 137  K 3.9 4.0 4.4 4.5  CL 100 102 99 99  CO2 26 25 27 23   GLUCOSE 119* 110* 118* 117*  BUN 16 16 22* 22*  CREATININE 0.62 0.72 0.72 0.60*  CALCIUM 9.7 9.9 9.9 10.0  MG 1.7  --   --   --   PHOS 4.4  --   --   --    GFR: Estimated Creatinine Clearance: 98.6 mL/min (A) (by C-G formula based on SCr of 0.6 mg/dL (L)). Liver Function Tests: Recent Labs  Lab 07/18/19 0420 07/19/19 1040 07/20/19 0650 07/23/19 0225  AST 484* 204* 104* 82*  ALT 440* 370* 273* 177*  ALKPHOS 860* 660* 529* 350*  BILITOT 2.3* 1.0 0.7 0.5  PROT 7.4 7.4 7.1 7.4  ALBUMIN 2.9* 3.0* 2.9* 3.1*   No results for input(s): LIPASE, AMYLASE in the last 168 hours. No results for input(s): AMMONIA in the last 168 hours. Coagulation Profile: No results for input(s): INR, PROTIME in the last 168 hours. Cardiac Enzymes: No results for input(s): CKTOTAL, CKMB, CKMBINDEX, TROPONINI in the last 168 hours. BNP (last 3 results) No results for input(s): PROBNP in the last 8760 hours. HbA1C: No results for input(s): HGBA1C in the last 72 hours. CBG: Recent Labs  Lab 07/23/19 1622 07/23/19 1955 07/23/19 2351 07/24/19 0423 07/24/19 0727  GLUCAP 98 113* 109* 112* 115*   Lipid Profile: No results for input(s): CHOL, HDL, LDLCALC, TRIG, CHOLHDL, LDLDIRECT in the last 72 hours. Thyroid Function Tests: No results for input(s): TSH, T4TOTAL, FREET4, T3FREE, THYROIDAB in the last 72 hours. Anemia Panel: No results for input(s): VITAMINB12, FOLATE, FERRITIN, TIBC, IRON, RETICCTPCT in the last 72 hours. Sepsis Labs: No results for input(s): PROCALCITON, LATICACIDVEN in the last 168 hours.  No results found for this or any previous visit (from the past 240 hour(s)).    Radiology Studies: DG ABD ACUTE 2+V W 1V CHEST  Result Date: 07/23/2019 CLINICAL DATA:   Abdominal pain EXAM: DG ABDOMEN ACUTE W/ 1V CHEST COMPARISON:  07/21/2019 FINDINGS: Supine and upright frontal views of the abdomen as well as an upright frontal view of the chest are obtained. Tracheostomy tube is identified. Enteric catheter extends over the gastric body. Cardiac silhouette is enlarged but stable. Chronic central vascular congestion, interstitial prominence, and left basilar opacity unchanged. Favor edema. The bowel gas pattern is unremarkable. No obstruction or ileus. Rounded radiodensity overlying the lower pelvis may be on or beneath the patient. Stable spondylosis lumbar spine. IMPRESSION: 1. Persistent findings of pulmonary edema. Superimposed infection cannot be excluded. No significant change since prior study. 2. Support devices as above. 3. Unremarkable bowel gas pattern. Electronically Signed   By: Randa Ngo M.D.   On: 07/23/2019 15:21        Scheduled Meds: . bacitracin  1 application Topical BID  . bethanechol  5 mg Per Tube TID  . chlorhexidine gluconate (MEDLINE KIT)  15 mL Mouth Rinse BID  . Chlorhexidine Gluconate Cloth  6 each Topical Daily  . clonazepam  0.5 mg Per Tube Q8H  . cloNIDine  0.3 mg Per Tube Q6H  . famotidine  20 mg Per Tube BID  . folic acid  1 mg Per Tube Daily  .  furosemide  40 mg Per Tube Daily  . guaiFENesin  15 mL Per Tube Q6H  . heparin injection (subcutaneous)  5,000 Units Subcutaneous Q8H  . insulin aspart  0-15 Units Subcutaneous Q4H  . mouth rinse  15 mL Mouth Rinse 10 times per day  . methadone  5 mg Per Tube Q8H  . metoprolol tartrate  75 mg Per Tube BID  . nicotine  14 mg Transdermal Daily  . polyethylene glycol  17 g Per Tube Daily  . QUEtiapine  200 mg Per Tube BID  . sertraline  25 mg Per Tube Daily  . sodium chloride flush  10-40 mL Intracatheter Q12H  . thiamine  100 mg Per Tube Daily   Continuous Infusions: . sodium chloride Stopped (07/17/19 2053)  . feeding supplement (VITAL AF 1.2 CAL) 1,000 mL (07/22/19  1233)  . sodium chloride      Assessment & Plan:   1.  Acute hypoxic/hypercarbic respiratory failure, present on admission-now tracheostomy dependent: Patient presented with neck swelling/infection with tracheitis and airway compromise.  Also noted to have multifocal pneumonia by CT on presentation.  He received Decadron/racemic epinephrine per ENT recommendations and was intubated.  Ended up with tracheostomy for failure to liberate from the vent.  PCCM managing vent care-has a cuffed #6 tracheostomy in place.  Copious tan secretions today, not candidate for downsizing yet per PCCM.  Chest x-ray ordered.  2.  Multifocal pneumonia, neck cellulitis/tracheitis with H. influenzae bacteremia: Completed IV Unasyn course on 1/23.  Pulmonary recommended 5 days of doxycycline and follow-up chest x-ray today.PICC line double lumen - placed 1/15 - removed 2/12  3.  Acute metabolic encephalopathy: In the setting of problem #1 and problem #2.  Appears to be slowly improving.  He also has history of alcohol abuse/opiate dependence.  Continue supportive care.  Delirium precautions.  On Seroquel 25 mg daily and methadone at lower dose of 5 mg 3 times daily.  Methadone being tapered down with clonazepam/clonidine.  4.  Dysphagia, history of esophageal stenosis: Speech therapy following along.  May need MBS.  Appears more awake now and may be able to participate in swallow evaluation.  Encourage patient to avoid pulling on [place].  Continue current tube feeds.  Will consider GI eval if significant dysphagia noted as inpatient.  5.  Hypertension with hypotensive episode on 2/14: Received IV fluid bolus yesterday.  Losartan, diltiazem discontinued but remains on metoprolol.  Also on clonidine which is likely contributing to hypotension--will taper down to 0.1 mg 3 times daily with holding parameters. Will also DC Lasix as no signs of fluid overload currently and no history of heart failure.  6. COPD/tobacco use:  Continue nicotine patch.  Add albuterol as needed.  7.  PSVT: Remains on beta-blockers.  Off diltiazem.  Patient did have PEA cardiac arrest on presentation requiring emergent tracheostomy.  6.  Alcohol abuse: Hospitalized for 42 days now.  Out of window for alcohol withdrawal.  Continue multivitamin/thiamine.  7. Moderate protein calorie malnutrition with hypoalbuminemia: Due to prolonged hospitalization and n.p.o. status/confusion.  Albumin level 2.9-3.1.  On tube feeds currently.  DVT prophylaxis: Lovenox Code Status: Full code Family / Patient Communication: Discussed with patient and wife at bedside.  Discussed with PCCM NP. Disposition Plan: May need SNF rehab.     LOS: 42 days    Time spent:     Guilford Shi, MD [facility] Pager in [place]  If 7PM-7AM, please contact night-coverage [email or web address] Password [facility] 07/24/2019, 9:48 AM

## 2019-07-24 NOTE — Progress Notes (Signed)
PULMONARY / CRITICAL CARE MEDICINE  NAME:  [name]., MRN:  [id number], DOB:  05/28/1961, LOS: 83 ADMISSION DATE:  06/12/2019, CONSULTATION DATE:  06/12/2019 REFERRING MD:  Alinda Sierras, CHIEF COMPLAINT:  Shortness of breath, sore throat, and neck swelling  BRIEF HISTORY:    59 yo male smoker with hx of ETOH had admission in November 2020 for pancreatitis and hyponatremia presented with dyspnea and neck swelling from cellulitis with acute airway compromise resulting in PEA requiring emergent tracheostomy.  Found to have H influenza bacteremia.  SIGNIFICANT PAST MEDICAL HISTORY   ETOH, Pneumonia, COPD, Rt lung empyema, Hep C, Pancreatitis  SIGNIFICANT EVENTS:  1/04 Lost airway and PEA arrest on, requiring emergent tracheostomy and ROSC obtained 1/05 Blood cultures 4/4 growing H.Influenza , narrowed to Unasyn 1/5 - 1/14 Continued on antibiotics without any significant improvement  1/14 New R foot swelling for which MRI obtained due to concerns for seeding cellulitis from neck 1/15 MRI with synovitis and subcutaneous edema suggestive of cellulitis; orthopedics consulted for right ankle aspiration - aspirate with positive crystals suggestive of gout - patient started on NSAIDs and steroids 1/16 Started on dilaudid, D/C fentanyl  1/17 Patient agitated early AM, q2h prn versed prescribed 1/18 Hb 5.2, transfused 2u pRBC; started methadone  1/19 Klonopin increased to tid and metoprolol added for SVTs 1/20 precedex gtt discontinued overnight for concerns of bradycardia  1/21 clonidine taper, 24hr trach collar 1/22 continuing trach collar 1/23 last day of unasyn for cellulitis, steroid taper and colchicine for gout, continuing trach collar  1/24 72 hours of trach collar 1/25 changed to 4 cuffless 1/26 sedation cut back 1/30 returned to ICU for hypercarbic respiratory failure and encephalopathy 2/2 inadvertent high dose of methadone and resultant resp faillure. Trach positioned in Cric so  underwent re-do 2/06 liberated from vent. Has been TC for 72 hours. 2/09 off precedex, transfer to progressive care 2/11 failed MBS 2/12 Required tracheal suctioning, decided to hold off on tracheostomy change over the weekend   CULTURES:  Influenza A : Negative Influenza B : Negtive Sars Coronavirus: Negative MRSA Nasopharyngeal negative 1/4 Blood cultures: 4/4 Positive for H. Influenza  Urine culture: No growth 1/6 Blood cultures >> 1/13 Tracheal aspirate >> few klebsiella pneumoniae  1/16 R ankle aspirate >> monosodium urate crystals, no organisms on gram stain  1/27 Sputum culture >> klebsiella >> S-cefazolin, cefepime  ANTIBIOTICS:  Bactrim 1/4 Azithromycin 1/4  Vancomycin 1/4 Zosyn  1/4 one dose Clindamycin 1/4 one dose Unasyn 1/5 >> 1/23  Ceftriaxone 1/29 (for klebsiella) >>   LINES/TUBES:  Tracheostomy 1/4 >> 1/30 (redo), 1/30 >> PICC line double lumen - placed 1/15  CONSULTANTS:  Infectious Disease  ENT  Orthopedic surgery  SUBJECTIVE:  No acute events. Wife at bedside, reports his mental status is improved / less agitated.   OBJECTIVE:       CONSTITUTIONAL: BP (!) 141/61 (BP Location: Right Leg)   Pulse 79   Temp 99.2 F (37.3 C) (Oral)   Resp 19   Ht 5\' 8"  (1.727 m)   Wt 70.2 kg   SpO2 100%   BMI 23.54 kg/m   I/O last 3 completed shifts: In: 30 [I.V.:30] Out: 2375 [Urine:1975; Emesis/NG output:400]   FiO2 (%):  [28 %] 28 %   PHYSICAL EXAM: General: ill appearing adult male lying in bed in NAD HEENT: MM pink/moist, #6 trach midline with creamy yellow secretions Neuro: Awake, alert, able to follow commands, MAE CV: s1s2 RRR, no m/r/g PULM:  Non-labored  on ATC 5L, increased creamy secretions at site, rhonchi bilaterally  GI: soft, bsx4 active  Extremities: warm/dry, no edema  Skin: no rashes or lesions  RESOLVED PROBLEMS:  Klebsiella HCAP, Aspiration pneumonia  ASSESSMENT AND PLAN    Cellulitis of the Neck in setting of H influenza  bacteremia  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 Per wife was set to see GI as outpatient for "esophageal stretching".  Increased secretions remain barrier to downsize at this point.   Plan -continue PMV efforts  -would hold on changing to #6 cuffless trach until secretions improve -continue SLP efforts > consider MBS  -trach care per protocol  -aspiration precautions  -PT efforts / mobilize  -assess CXR with increased tracheal secretions -add doxycycline for possible tracheobronchitis x 5 days.  Pending CXR review, may need alternate coverage.    PCCM will follow up on 2/17.  Please call sooner if new needs arise.   Noe Gens, MSN, NP-C [facility] 07/24/2019, 3:17 PM   Please see [email or web address] for pager details.

## 2019-07-24 NOTE — Progress Notes (Signed)
Physical Therapy Treatment Patient Details Name: [name]. MRN: [id number] DOB: Jan 30, 1961 Today's Date: 07/24/2019    History of Present Illness Pt is a 59 y.o. male admitted 06/12/19 for SOB and neck swelling. Admitted to ICU with loss of airway and PEA arrest requiring emergent trach; has been difficult to wean off sedation. 1/15 R foot MRI suggestive of cellulitis, s/p aspiration. 1/30 return to ICU for hypercarbic respiratory failure and encephalopathy. 2/06 on trach collar 72 hrs and vent liberated; 2/09 orders to leave ICU for progressive care  PMH includes recent admission for pancreatitis (04/10/19), ETOH and tobacco use, HTN.    PT Comments    Patient more alert and participatory today. Room quiet with TV off and only one therapist present with pt following commands more briskly (although still delayed) and continues to be distracted by line and bil mitts. With PMSV in place, tolerated supine exercises and sitting EOB with sats >95%. Will try to see with 2 person assist next visit to further work on transfers/standing. (?use Stedy)    Follow Up Recommendations  SNF;LTACH;Supervision/Assistance - 24 hour     Equipment Recommendations  (defer)    Recommendations for Other Services       Precautions / Restrictions Precautions Precautions: Fall Precaution Comments: trach collar, cortrak feeding tube;  bilat mittens; posey wrist restraints Restrictions Weight Bearing Restrictions: No    Mobility  Bed Mobility Overal bed mobility: Needs Assistance Bed Mobility: Supine to Sit;Sit to Supine Rolling: Min guard Sidelying to sit: HOB elevated;Min assist(HOB 45)     Sit to sidelying: Min guard;HOB elevated(HOB elevated due to tube feeding) General bed mobility comments: closeguarding for safety  Transfers                 General transfer comment: not assessed with only +1 assist  Ambulation/Gait                 Stairs             Wheelchair  Mobility    Modified Rankin (Stroke Patients Only)       Balance Overall balance assessment: Needs assistance Sitting-balance support: Single extremity supported;Feet unsupported Sitting balance-Leahy Scale: Poor Sitting balance - Comments: R lateral lean supported sitting EOB                                    Cognition Arousal/Alertness: Awake/alert Behavior During Therapy: Restless;Impulsive Overall Cognitive Status: Difficult to assess Area of Impairment: Attention;Following commands;Awareness;Safety/judgement                   Current Attention Level: Sustained(>sustained NT)   Following Commands: Follows one step commands with increased time;Follows one step commands consistently;Follows multi-step commands inconsistently Safety/Judgement: Decreased awareness of safety;Decreased awareness of deficits Awareness: Intellectual Problem Solving: Requires verbal cues;Requires tactile cues General Comments: Patient participated with all exercises requested of him; returned to supine from EOB when asked; allowed restraints to be re-tied. Fixated on getting up to go to the bathroom, but once seated and able to see his catheter and rectal tubing, he said "ok" and stopped requesting.       Exercises General Exercises - Lower Extremity Quad Sets: Strengthening;Both;10 reps;Supine(prior to initiating SLR) Hip ABduction/ADduction: Strengthening;Both;10 reps;Supine Straight Leg Raises: Strengthening;Both;10 reps;Supine Hip Flexion/Marching: AROM;Both;10 reps;Seated Other Exercises Other Exercises: bridging with feet supported to prevent sliding; 5 reps x 2 sets    General Comments  Pertinent Vitals/Pain Pain Assessment: No/denies pain    Home Living                      Prior Function            PT Goals (current goals can now be found in the care plan section) Acute Rehab PT Goals Patient Stated Goal: "I'm gonna go to the  bathroom" Time For Goal Achievement: 08/04/19 Potential to Achieve Goals: Fair Progress towards PT goals: Progressing toward goals    Frequency    Min 2X/week      PT Plan Current plan remains appropriate    Co-evaluation              AM-PAC PT "6 Clicks" Mobility   Outcome Measure  Help needed turning from your back to your side while in a flat bed without using bedrails?: None Help needed moving from lying on your back to sitting on the side of a flat bed without using bedrails?: A Lot Help needed moving to and from a bed to a chair (including a wheelchair)?: A Lot Help needed standing up from a chair using your arms (e.g., wheelchair or bedside chair)?: A Lot Help needed to walk in hospital room?: Total Help needed climbing 3-5 steps with a railing? : Total 6 Click Score: 12    End of Session Equipment Utilized During Treatment: Oxygen(trach collar 28%; PMSV with sats >95% throughout) Activity Tolerance: Patient tolerated treatment well Patient left: in bed;with call bell/phone within reach;with bed alarm set;with restraints reapplied Nurse Communication: Mobility status PT Visit Diagnosis: Muscle weakness (generalized) (M62.81);Difficulty in walking, not elsewhere classified (R26.2)     Time: 1029-1100 PT Time Calculation (min) (ACUTE ONLY): 31 min  Charges:  $Therapeutic Exercise: 8-22 mins $Therapeutic Activity: 8-22 mins                      Arby Barrette, PT Pager [phone]    Rexanne Mano 07/24/2019, 11:16 AM

## 2019-07-25 LAB — CBC
HCT: 37.6 % — ABNORMAL LOW (ref 39.0–52.0)
Hemoglobin: 11.3 g/dL — ABNORMAL LOW (ref 13.0–17.0)
MCH: 25.5 pg — ABNORMAL LOW (ref 26.0–34.0)
MCHC: 30.1 g/dL (ref 30.0–36.0)
MCV: 84.7 fL (ref 80.0–100.0)
Platelets: 369 10*3/uL (ref 150–400)
RBC: 4.44 MIL/uL (ref 4.22–5.81)
RDW: 18.7 % — ABNORMAL HIGH (ref 11.5–15.5)
WBC: 8.2 10*3/uL (ref 4.0–10.5)
nRBC: 0 % (ref 0.0–0.2)

## 2019-07-25 LAB — BASIC METABOLIC PANEL
Anion gap: 11 (ref 5–15)
BUN: 23 mg/dL — ABNORMAL HIGH (ref 6–20)
CO2: 25 mmol/L (ref 22–32)
Calcium: 10.1 mg/dL (ref 8.9–10.3)
Chloride: 100 mmol/L (ref 98–111)
Creatinine, Ser: 0.58 mg/dL — ABNORMAL LOW (ref 0.61–1.24)
GFR calc Af Amer: >60 mL/min (ref >60)
GFR calc non Af Amer: >60 mL/min (ref >60)
Glucose, Bld: 94 mg/dL (ref 70–99)
Potassium: 4.2 mmol/L (ref 3.5–5.1)
Sodium: 136 mmol/L (ref 135–145)

## 2019-07-25 LAB — GLUCOSE, CAPILLARY
Glucose-Capillary: 100 mg/dL — ABNORMAL HIGH (ref 70–99)
Glucose-Capillary: 109 mg/dL — ABNORMAL HIGH (ref 70–99)
Glucose-Capillary: 114 mg/dL — ABNORMAL HIGH (ref 70–99)
Glucose-Capillary: 130 mg/dL — ABNORMAL HIGH (ref 70–99)
Glucose-Capillary: 84 mg/dL (ref 70–99)
Glucose-Capillary: 86 mg/dL (ref 70–99)
Glucose-Capillary: 90 mg/dL (ref 70–99)

## 2019-07-25 MED ORDER — OSMOLITE 1.2 CAL PO LIQD
1000.0000 mL | ORAL | Status: DC
  Administered 2019-07-25 – 2019-07-27 (×6): 1000 mL
  Filled 2019-07-25 (×10): qty 1000

## 2019-07-25 MED ORDER — METHADONE HCL 5 MG PO TABS
5.0000 mg | ORAL_TABLET | Freq: Two times a day (BID) | ORAL | Status: DC
  Administered 2019-07-25 – 2019-07-28 (×6): 5 mg
  Filled 2019-07-25 (×6): qty 1

## 2019-07-25 MED ORDER — QUETIAPINE FUMARATE 100 MG PO TABS
100.0000 mg | ORAL_TABLET | Freq: Two times a day (BID) | ORAL | Status: DC
  Administered 2019-07-25 – 2019-07-26 (×2): 100 mg
  Filled 2019-07-25 (×2): qty 1

## 2019-07-25 MED ORDER — PRO-STAT SUGAR FREE PO LIQD
30.0000 mL | Freq: Two times a day (BID) | ORAL | Status: DC
  Administered 2019-07-25 – 2019-07-31 (×9): 30 mL
  Filled 2019-07-25 (×8): qty 30

## 2019-07-25 MED ORDER — CLONIDINE HCL 0.2 MG PO TABS
0.2000 mg | ORAL_TABLET | Freq: Three times a day (TID) | ORAL | Status: DC
  Administered 2019-07-25 – 2019-07-28 (×10): 0.2 mg
  Filled 2019-07-25 (×10): qty 1

## 2019-07-25 MED ORDER — HALOPERIDOL LACTATE 5 MG/ML IJ SOLN
1.0000 mg | Freq: Once | INTRAMUSCULAR | Status: AC
  Administered 2019-07-25: 1 mg via INTRAVENOUS
  Filled 2019-07-25: qty 1

## 2019-07-25 NOTE — Progress Notes (Signed)
Nutrition Follow-up  DOCUMENTATION CODES:   Non-severe (moderate) malnutrition in context of chronic illness  INTERVENTION:  Discontinue Vital AF 1.2 formula.   Initiate Osmolite 1.2 formula via Cortrak NGT at 35 ml/hr and increase by 10 ml every 4 hours to goal rate of 65 ml/hr.  Provide 30 ml Prostat BID per tube.   Tube feeding formula to provide 2072 kcal, 117 grams of protein, and 1279 ml free water.   NUTRITION DIAGNOSIS:   Moderate Malnutrition related to chronic illness(COPD, alcoholism) as evidenced by mild fat depletion, moderate fat depletion, mild muscle depletion; ongoing  GOAL:   Patient will meet greater than or equal to 90% of their needs; met with TF  MONITOR:   Diet advancement, Labs, Weight trends, TF tolerance, I & O's, Skin  REASON FOR ASSESSMENT:   Ventilator    ASSESSMENT:   59 yo male admitted with SOB, neck swelling, sore throat. Found to have H. flu in blood. S/P PEA arrest requiring emergent tracheostomy 1/4. PMH includes alcohol abuse, dysphagia, chronic hepatitis C, heavy smoker, COPD, poor dentition.  Pt on trach collar. Pt remains NPO status. Pt failed MBS 2/11. Pt has been tolerating his tube feeds well. RD to modify tube feeding orders and switch to a standard formula as current specialized formula not needed at this time. RD to continue to monitor for tolerance.   Labs and medications reviewed.   Diet Order:   Diet Order            Diet NPO time specified  Diet effective now              EDUCATION NEEDS:   Not appropriate for education at this time  Skin:  Skin Assessment: Skin Integrity Issues: Skin Integrity Issues:: Stage III Stage III: R neck  Last BM:  2/15  Height:   Ht Readings from Last 1 Encounters:  06/12/19 5' 8"  (1.727 m)    Weight:   Wt Readings from Last 1 Encounters:  07/21/19 70.2 kg    Ideal Body Weight:  70 kg  BMI:  Body mass index is 23.54 kg/m.  Estimated Nutritional Needs:   Kcal:   1900-2100  Protein:  100-115 gm  Fluid:  >/= 1.9 L   Corrin Parker, MS, RD, LDN RD pager number/after hours weekend pager number on Amion.

## 2019-07-25 NOTE — Plan of Care (Signed)
  Problem: Safety: Goal: Ability to remain free from injury will improve Outcome: Not Progressing   Hourly rounding. Pt continues to require restraints for safety  Problem: Skin Integrity: Goal: Risk for impaired skin integrity will decrease Outcome: Not Progressing  Monitor for incontinence. Complete Braden Score qshift and prn

## 2019-07-25 NOTE — Progress Notes (Addendum)
PROGRESS NOTE    [name].  NKN:[id number]  DOB: 03-Sep-1960  PCP: William Chou, NP 59 y.o. male with a history of alcohol/tobacco abuse, COPD, hyponatremia,empyema in June 2020 and admission for acute pancreatitis in November 3790 complicated by respiratory failure requiring tracheostomy at that time presented on 06/12/2019 with neck swelling found to have cellulitis with acute airway compromise resulting in PEA arrest requiring emergent tracheostomy. Neck CTA-thickening epiglottis with pharyngeal and supraglottic wall edema,also multifocal pneumonia by CT-recieved empiric antibiotics, racemic epinephrine with Decadron per ENT recommendation. Blood cultures grew H.Influenza 4/4 , abx changed to Unasyn 1/5 >1/14 without any significant improvement. New right foot swelling noted on 1/14- MRI obtained to rule out infectious emboli-it showed synovitis and subcutaneous edema suggestive of cellulitis; orthopedics consulted for right ankle aspiration - aspirate with positive crystals suggestive of gout - patient started on NSAIDs and steroids. Pain meds titrated ( Started on dilaudid, methadone, D/Ced fentanyl). Patient completed Unasyn course on 1/23.  HC complicated by delirium/agitation requiring versed/klonapin/precedex gtt while in ICU, anemia requiring transfusion, elevated LFTs, and paroxysmal SVTs. On 1/17: Patient agitated early AM, q2h prn versed prescribed. Hb 5.2, transfused 2u pRBC; started methadone . On 1/19: Klonopin increased to tid and metoprolol added for SVTs but patient had subsequent bradycardia, clonidine tapered,precedex gtt discontinued . 1/21-1/24--placed on trach collar, then changed to 4 cuffless and sedation cut back slowly. However, on 1/30 patient returned to ICU for hypercarbic respiratory failure and encephalopathy--finally liberated from ventilator on 2/06- takenoff precedex and transferred to progressive care/ [facility] on 2/09. PCCM continuing tracheostomy care, remains on  trach collar. 2/11 Failed swallow study, PICC line removed 2/12. Clonazepam/methadone dosing being tapered down. Per wife, patient has history of dysphagia, suspected to have esophageal stenosis and was supposed to follow-up with GI for "stretching" as outpatient. Losartan/diltiazem discontinued 2/14 and patient given 250 normal saline bolus for hypotension.Seen by PCCM, ID, ENT and ortho during [place].    Subjective: Patient appears somnolent/sedated.  NGT still in place. SBP 140-150s now. CXR done 2/15 shows mild worsening of left basilar opacity. On Seroquel 168m as well as Zoloft. Methadone/clonidine being tapered. Speech following.  Objective: Vitals:   07/25/19 0500 07/25/19 0724 07/25/19 0819 07/25/19 0827  BP: (!) 150/75   (!) 149/79  Pulse:  68  96  Resp:  (!) 22 18 18   Temp:    98 F (36.7 C)  TempSrc:    Oral  SpO2:  100% 97%   Weight:      Height:        Intake/Output Summary (Last 24 hours) at 07/25/2019 0836 Last data filed at 07/25/2019 0820 Gross per 24 hour  Intake 2640 ml  Output 900 ml  Net 1740 ml   Filed Weights   07/14/19 0500 07/18/19 0427 07/21/19 0342  Weight: 69.7 kg 70.5 kg 70.2 kg    Physical Examination:  General exam: Appears restless but awake and communicating.  Core track in place. Respiratory system: Clear to auscultation. Respiratory effort normal. Cardiovascular system: S1 & S2 heard, RRR. No JVD, murmurs. No pedal edema. Gastrointestinal system: Abdomen is nondistended, soft and nontender. Normal BS Central nervous system: Alert and oriented x2. No new focal neurological deficits. Extremities: No contractures, edema or joint deformities.  Skin: No rashes, lesions or ulcers Psychiatry: Judgement and insight appear normal. Mood & affect appropriate.   Data Reviewed: I have personally reviewed following labs and imaging studies  CBC: Recent Labs  Lab 07/20/19 0650 07/23/19 0225 07/25/19  0141  WBC 10.2 8.3 8.2  HGB 11.0* 11.5* 11.3*    HCT 37.2* 39.6 37.6*  MCV 85.9 87.0 84.7  PLT 273 314 638   Basic Metabolic Panel: Recent Labs  Lab 07/19/19 1040 07/20/19 0650 07/23/19 0225 07/25/19 0141  NA 139 139 137 136  K 4.0 4.4 4.5 4.2  CL 102 99 99 100  CO2 25 27 23 25   GLUCOSE 110* 118* 117* 94  BUN 16 22* 22* 23*  CREATININE 0.72 0.72 0.60* 0.58*  CALCIUM 9.9 9.9 10.0 10.1   GFR: Estimated Creatinine Clearance: 98.6 mL/min (A) (by C-G formula based on SCr of 0.58 mg/dL (L)). Liver Function Tests: Recent Labs  Lab 07/19/19 1040 07/20/19 0650 07/23/19 0225  AST 204* 104* 82*  ALT 370* 273* 177*  ALKPHOS 660* 529* 350*  BILITOT 1.0 0.7 0.5  PROT 7.4 7.1 7.4  ALBUMIN 3.0* 2.9* 3.1*   No results for input(s): LIPASE, AMYLASE in the last 168 hours. No results for input(s): AMMONIA in the last 168 hours. Coagulation Profile: No results for input(s): INR, PROTIME in the last 168 hours. Cardiac Enzymes: No results for input(s): CKTOTAL, CKMB, CKMBINDEX, TROPONINI in the last 168 hours. BNP (last 3 results) No results for input(s): PROBNP in the last 8760 hours. HbA1C: No results for input(s): HGBA1C in the last 72 hours. CBG: Recent Labs  Lab 07/24/19 1644 07/24/19 1934 07/25/19 0029 07/25/19 0326 07/25/19 0829  GLUCAP 91 106* 114* 90 109*   Lipid Profile: No results for input(s): CHOL, HDL, LDLCALC, TRIG, CHOLHDL, LDLDIRECT in the last 72 hours. Thyroid Function Tests: No results for input(s): TSH, T4TOTAL, FREET4, T3FREE, THYROIDAB in the last 72 hours. Anemia Panel: No results for input(s): VITAMINB12, FOLATE, FERRITIN, TIBC, IRON, RETICCTPCT in the last 72 hours. Sepsis Labs: No results for input(s): PROCALCITON, LATICACIDVEN in the last 168 hours.  No results found for this or any previous visit (from the past 240 hour(s)).    Radiology Studies: DG CHEST PORT 1 VIEW  Result Date: 07/24/2019 CLINICAL DATA:  Acute respiratory failure with hypoxia. Additional history provided: Increased  tracheal secretions, assess for new/worsening opacities. EXAM: PORTABLE CHEST 1 VIEW COMPARISON:  Radiograph of the chest and abdomen 07/23/2019. FINDINGS: A tracheostomy tube terminates just below the level of the clavicular heads. An enteric tube passes below the level of left hemidiaphragm with tip excluded from the field of view. Overlying cardiac monitoring leads. Unchanged cardiomegaly. Aortic atherosclerosis. Interval increase in conspicuity of a left basilar opacity. A small left pleural effusion is difficult to exclude. Similar appearance of prominent interstitial lung markings within the mid to lower lung fields bilaterally. No evidence of right pleural effusion or evidence of pneumothorax. No acute bony abnormality. IMPRESSION: Support devices as described. Interval increase in conspicuity of a left basilar opacity. Findings may reflect atelectasis or pneumonia. Similar appearance of background interstitial prominence within the mid to lower lung fields which may reflect edema or infection. Unchanged cardiomegaly.  Aortic atherosclerosis. Electronically Signed   By: Kellie Simmering DO   On: 07/24/2019 16:42   DG ABD ACUTE 2+V W 1V CHEST  Result Date: 07/23/2019 CLINICAL DATA:  Abdominal pain EXAM: DG ABDOMEN ACUTE W/ 1V CHEST COMPARISON:  07/21/2019 FINDINGS: Supine and upright frontal views of the abdomen as well as an upright frontal view of the chest are obtained. Tracheostomy tube is identified. Enteric catheter extends over the gastric body. Cardiac silhouette is enlarged but stable. Chronic central vascular congestion, interstitial prominence, and left basilar  opacity unchanged. Favor edema. The bowel gas pattern is unremarkable. No obstruction or ileus. Rounded radiodensity overlying the lower pelvis may be on or beneath the patient. Stable spondylosis lumbar spine. IMPRESSION: 1. Persistent findings of pulmonary edema. Superimposed infection cannot be excluded. No significant change since prior  study. 2. Support devices as above. 3. Unremarkable bowel gas pattern. Electronically Signed   By: Randa Ngo M.D.   On: 07/23/2019 15:21        Scheduled Meds: . bacitracin  1 application Topical BID  . bethanechol  5 mg Per Tube TID  . chlorhexidine gluconate (MEDLINE KIT)  15 mL Mouth Rinse BID  . Chlorhexidine Gluconate Cloth  6 each Topical Daily  . clonazepam  0.5 mg Per Tube Q8H  . cloNIDine  0.2 mg Per Tube TID  . doxycycline  100 mg Oral Q12H  . famotidine  20 mg Per Tube BID  . folic acid  1 mg Per Tube Daily  . guaiFENesin  15 mL Per Tube Q6H  . heparin injection (subcutaneous)  5,000 Units Subcutaneous Q8H  . insulin aspart  0-15 Units Subcutaneous Q4H  . mouth rinse  15 mL Mouth Rinse 10 times per day  . methadone  5 mg Per Tube Q12H  . metoprolol tartrate  75 mg Per Tube BID  . multivitamin  15 mL Oral Daily  . nicotine  14 mg Transdermal Daily  . polyethylene glycol  17 g Per Tube Daily  . QUEtiapine  200 mg Per Tube BID  . sertraline  25 mg Per Tube Daily  . sodium chloride flush  10-40 mL Intracatheter Q12H  . thiamine  100 mg Per Tube Daily   Continuous Infusions: . sodium chloride Stopped (07/17/19 2053)  . feeding supplement (VITAL AF 1.2 CAL) 1,000 mL (07/24/19 1700)  . sodium chloride      Assessment & Plan:   1.  Acute hypoxic/hypercarbic respiratory failure, present on admission-now tracheostomy dependent: Patient presented with neck swelling/infection with tracheitis and airway compromise.  Also noted to have multifocal pneumonia by CT on presentation.  He received Decadron/racemic epinephrine per ENT recommendations and was intubated.  Ended up with tracheostomy for failure to liberate from the vent.  PCCM managing vent care-has a cuffed #6 tracheostomy in place.  Copious tan secretions, not candidate for downsizing yet per PCCM. Repeat Chest x-ray 2/15-slightly worsened left basilar infiltrate.  2.  Multifocal pneumonia, neck  cellulitis/tracheitis with H. influenzae bacteremia: Completed IV Unasyn course on 1/23.  Pulmonary recommended 5 days of doxycycline and follow-up chest x-ray today.PICC line double lumen - placed 1/15 - removed 2/12  3.  Acute metabolic encephalopathy: In the setting of problem #1 and problem #2.  Appears to be slowly improving.  He also has history of alcohol abuse/opiate dependence.  Continue supportive care.  Delirium precautions.  On Seroquel 200 mg BID and zoloft 25 mg daily. Taper methadone to 5 mg BID Methadone being tapered down while titrating clonazepam/clonidine.  QTc 456 ms on EKG today.  Given sedated appearance, will DC Klonopin/Zoloft and reduce Seroquel scheduled dose.  Will have Seroquel/Haldol additional dosing available as needed for agitation.  4.  Dysphagia, history of esophageal stenosis: Speech therapy following along.  May need MBS.  Appears more awake now and may be able to participate in swallow evaluation.  Encourage patient to avoid pulling on [place].  Continue current tube feeds.  Will consider GI eval if significant dysphagia noted as inpatient.  5.  Hypertension with hypotensive  episode on 2/14: Received IV fluid bolus.  Losartan, diltiazem discontinued but remains on metoprolol.  Also on clonidine, given hypotension-- tapered down to 0.1 mg Q6 hrs, BP up today -will change to 0.46m 3 times daily with holding parameters. DCed Lasix  2/15 as no signs of fluid overload currently and no history of heart failure.  6. COPD/tobacco use: Continue nicotine patch.  Add albuterol as needed.  7.  PSVT: Remains on beta-blockers.  Off diltiazem.  Patient did have PEA cardiac arrest on presentation requiring emergent tracheostomy.  6.  Alcohol abuse: Hospitalized for 42 days now.  Out of window for alcohol withdrawal.  Continue multivitamin/thiamine. Elevated LFTs last week--improving now.   7. Moderate protein calorie malnutrition with hypoalbuminemia: Due to prolonged  hospitalization and n.p.o. status/confusion.  Albumin level 2.9-3.1.  On tube feeds currently.  DVT prophylaxis: Lovenox Code Status: Full code Family / Patient Communication: Wife not at bedside today. Disposition Plan: May need SNF rehab/LTAC when acute issues resolve and medically cleared for discharge with improved mental status, off NG tube, trach care establ;ished     LOS: 43 days    Time spent: 35 minutes    NGuilford Shi MD [facility] Pager in [place] If 7PM-7AM, please contact night-coverage [email or web address] Password [email or web address]2/16/2021, 8:36 AM

## 2019-07-26 ENCOUNTER — Inpatient Hospital Stay (HOSPITAL_COMMUNITY): Payer: Medicaid Other

## 2019-07-26 LAB — CBC WITH DIFFERENTIAL/PLATELET
Abs Immature Granulocytes: 0.04 10*3/uL (ref 0.00–0.07)
Basophils Absolute: 0.1 10*3/uL (ref 0.0–0.1)
Basophils Relative: 1 %
Eosinophils Absolute: 0.2 10*3/uL (ref 0.0–0.5)
Eosinophils Relative: 3 %
HCT: 38.9 % — ABNORMAL LOW (ref 39.0–52.0)
Hemoglobin: 11.8 g/dL — ABNORMAL LOW (ref 13.0–17.0)
Immature Granulocytes: 0 %
Lymphocytes Relative: 16 %
Lymphs Abs: 1.5 10*3/uL (ref 0.7–4.0)
MCH: 25.9 pg — ABNORMAL LOW (ref 26.0–34.0)
MCHC: 30.3 g/dL (ref 30.0–36.0)
MCV: 85.3 fL (ref 80.0–100.0)
Monocytes Absolute: 0.8 10*3/uL (ref 0.1–1.0)
Monocytes Relative: 8 %
Neutro Abs: 6.7 10*3/uL (ref 1.7–7.7)
Neutrophils Relative %: 72 %
Platelets: 427 10*3/uL — ABNORMAL HIGH (ref 150–400)
RBC: 4.56 MIL/uL (ref 4.22–5.81)
RDW: 19 % — ABNORMAL HIGH (ref 11.5–15.5)
WBC: 9.3 10*3/uL (ref 4.0–10.5)
nRBC: 0 % (ref 0.0–0.2)

## 2019-07-26 LAB — GLUCOSE, CAPILLARY
Glucose-Capillary: 104 mg/dL — ABNORMAL HIGH (ref 70–99)
Glucose-Capillary: 107 mg/dL — ABNORMAL HIGH (ref 70–99)
Glucose-Capillary: 113 mg/dL — ABNORMAL HIGH (ref 70–99)
Glucose-Capillary: 126 mg/dL — ABNORMAL HIGH (ref 70–99)
Glucose-Capillary: 127 mg/dL — ABNORMAL HIGH (ref 70–99)
Glucose-Capillary: 75 mg/dL (ref 70–99)

## 2019-07-26 LAB — RENAL FUNCTION PANEL
Albumin: 3.2 g/dL — ABNORMAL LOW (ref 3.5–5.0)
Anion gap: 15 (ref 5–15)
BUN: 25 mg/dL — ABNORMAL HIGH (ref 6–20)
CO2: 25 mmol/L (ref 22–32)
Calcium: 10.2 mg/dL (ref 8.9–10.3)
Chloride: 99 mmol/L (ref 98–111)
Creatinine, Ser: 0.61 mg/dL (ref 0.61–1.24)
GFR calc Af Amer: >60 mL/min (ref >60)
GFR calc non Af Amer: >60 mL/min (ref >60)
Glucose, Bld: 119 mg/dL — ABNORMAL HIGH (ref 70–99)
Phosphorus: 5 mg/dL — ABNORMAL HIGH (ref 2.5–4.6)
Potassium: 4.5 mmol/L (ref 3.5–5.1)
Sodium: 139 mmol/L (ref 135–145)

## 2019-07-26 MED ORDER — QUETIAPINE FUMARATE 100 MG PO TABS
200.0000 mg | ORAL_TABLET | Freq: Two times a day (BID) | ORAL | Status: DC
  Administered 2019-07-26 – 2019-07-28 (×4): 200 mg
  Filled 2019-07-26 (×5): qty 2

## 2019-07-26 NOTE — Progress Notes (Signed)
RN called significant other to update her on fall that occurred overnight. Significant other verbalized understanding and all questions and concerns answered.

## 2019-07-26 NOTE — Progress Notes (Signed)
This RN noticed the patient was off the monitor in the hallway, and went to go check on the patient. Upon entering the room, the RN found the patient on the floor covered in urine from an unwitnessed fall. Twenty minutes prior to the fall, RN Marcie Bal and Nurse Tech Lia were in the room changing the patient and giving the patient a bath. RN called for help and several staff came to help. The patient had bilateral soft wrist restraints, bilateral mittens, and all four side rails up. The patient was found to have only one wrist restraint tied and the bed alarm was off. The patient also has a trach and a cortrax, the oxygen on the trach was off the patient but oxygen saturations were in the high 90s, Cortrax was still in place. Patient was assessed for injuries and then helped back to bed by staff and vitals signs were taken and documented. Tylene Fantasia NP was notified along with primary nurse Berneda Rose and Charge Nurse [place].

## 2019-07-26 NOTE — Progress Notes (Signed)
  Speech Language Pathology Treatment: Dysphagia;Passy Muir Speaking valve  Patient Details Name: [name]. MRN: [id number] DOB: [birth year] Today's Date: 07/26/2019 Time: [id number] SLP Time Calculation (min) (ACUTE ONLY): 33 min  Assessment / Plan / Recommendation Clinical Impression  Treatment focused on dysphagia intervention, use of PMV and speech intelligibility. Alert, smiling and interactive with sitter at bedside. Pt conversed with therapist using speaking valve in a clear but decreased intensity vocal quality. Now that he is consistently awake with increased verbalizations at sentence level he was given feedback/education to slow rate and increase effort/movement of articulators which he was able to demonstrate with frequent reminders. All vitals were normal using valve. If RN or SLP has initially placed PMV and it falls off with coughing, sitter present today is allowed to re-place valve and demonstrated for her.    Dysphagia therapy included exercises to facilitate laryngeal elevation with Mendelsohn maneuver which he performed incompletely. Last session he could not perform Masako maneuver which assists with tongue base and pharyngeal contraction however today he was able to imitate therapist. Vocalic pitch variations for laryngeal elevation trialed. With each "dry swallow" of saliva pt coughed indicative of likely penetration. Ice chip trials and separate 1/2 teaspoon size water resulted in frequent coughing. He is getting closer to repeating MBS however he would benefit from additional therapy- also likely not discharging hospital soon and he has had 2 MBS thus far (either Fri or first next week).    HPI HPI: 59 year old man with a history of alcohol use disorder, tobacco use disorder, HTN, and recent admission on 04/10/2019 for pancreatitis and hyponatremia who presents for evaluation of shortness of breath and neck swelling with loss of airway and PEA arrest requiring emergent  tracheostomy. Trach changed from 4 cuffless back to [place] 6 cuffed on 1/30 after RR event, and transferred back to ICU.  Pt has been difficult to wean from sedation and intermittently agitated.      SLP Plan  Continue with current plan of care       Recommendations  Diet recommendations: NPO Medication Administration: Via alternative means      Patient may use Passy-Muir Speech Valve: During all waking hours (remove during sleep) PMSV Supervision: Full MD: Please consider changing trach tube to : Smaller size;Cuffless         Oral Care Recommendations: Oral care QID Follow up Recommendations: Skilled Nursing facility SLP Visit Diagnosis: Dysphagia, oropharyngeal phase (R13.12);[place]ph[place] (R49.1) Plan: Continue with current plan of care             Houston Siren 07/26/2019, 3:57 PM   Orbie Pyo Colvin Caroli.Ed Risk analyst [phone] Office [phone]

## 2019-07-26 NOTE — Progress Notes (Signed)
Physical Therapy Treatment Patient Details Name: [name]. MRN: [id number] DOB: 07/17/1960 Today's Date: 07/26/2019    History of Present Illness Pt is a 59 y.o. male admitted 06/12/19 for SOB and neck swelling. Admitted to ICU with loss of airway and PEA arrest requiring emergent trach; has been difficult to wean off sedation. 1/15 R foot MRI suggestive of cellulitis, s/p aspiration. 1/30 return to ICU for hypercarbic respiratory failure and encephalopathy. 2/06 on trach collar 72 hrs and vent liberated; 2/09 orders to leave ICU for progressive care  PMH includes recent admission for pancreatitis (04/10/19), ETOH and tobacco use, HTN.    PT Comments    Patient progressing with mobility and somewhat with safety/cognition.  Still very impulsive pulling off his male purewick and almost removing his coretrack.  Noted fall this morning, now with R lateral thigh pain more with weight bearing, but also at rest.  RN made aware.  Feel continued skilled PT indicated during acute stay and follow up SNF level rehab at d/c.   Follow Up Recommendations  SNF;Supervision/Assistance - 24 hour     Equipment Recommendations  Other (comment)(TBA)    Recommendations for Other Services       Precautions / Restrictions Precautions Precautions: Fall Precaution Comments: trach collar, cortrak feeding tube;  bilat mittens; posey wrist restraints Restrictions Weight Bearing Restrictions: No    Mobility  Bed Mobility Overal bed mobility: Needs Assistance Bed Mobility: Supine to Sit;Sit to Supine     Supine to sit: Min assist;+2 for safety/equipment;HOB elevated Sit to supine: Min guard;+2 for safety/equipment   General bed mobility comments: +2 needed for line management and safety  Transfers Overall transfer level: Needs assistance Equipment used: 2 person hand held assist;Rolling walker (2 wheeled) Transfers: Sit to/from [facility] Sit to Stand: +2 physical assistance;Mod  assist;+2 safety/equipment Stand pivot transfers: Mod assist       General transfer comment: stand pivot to [facility] initially no device wit +2 A  needed for safety, back to bed with mod A of 1 with RW  Ambulation/Gait             General Gait Details: deferred due to coretrack bridle broken and needing reattachment.   Stairs             Wheelchair Mobility    Modified Rankin (Stroke Patients Only)       Balance Overall balance assessment: Needs assistance Sitting-balance support: Single extremity supported;Feet unsupported Sitting balance-Leahy Scale: Fair Sitting balance - Comments: EOB with supervision, pt wiping nose and disconnected bridle from coretrack   Standing balance support: Bilateral upper extremity supported Standing balance-Leahy Scale: Poor Standing balance comment: reliance on BUE support, several LOB without UE supported                            Cognition Arousal/Alertness: Awake/alert Behavior During Therapy: Impulsive;Restless Overall Cognitive Status: Difficult to assess Area of Impairment: Safety/judgement;Following commands;Awareness;Problem solving                   Current Attention Level: Sustained Memory: Decreased short-term memory;Decreased recall of precautions Following Commands: Follows one step commands consistently;Follows multi-step commands inconsistently Safety/Judgement: Decreased awareness of safety;Decreased awareness of deficits Awareness: Intellectual Problem Solving: Requires verbal cues;Requires tactile cues General Comments: Pt with improved direction following, appropriate interactions with staff, however still impulsive      Exercises      General Comments General comments (skin integrity, edema,  etc.): on trach collar 28% FiO2, cuff deflated and PMSV on for much of session SpO2 95%      Pertinent Vitals/Pain Pain Assessment: Faces Faces Pain Scale: Hurts even more Pain Location: R thigh   Pain Descriptors / Indicators: Discomfort;Guarding Pain Intervention(s): Monitored during session;Repositioned(RN informed)    Home Living                      Prior Function            PT Goals (current goals can now be found in the care plan section) Acute Rehab PT Goals Patient Stated Goal: None stated Progress towards PT goals: Progressing toward goals    Frequency    Min 2X/week      PT Plan Current plan remains appropriate    Co-evaluation PT/OT/SLP Co-Evaluation/Treatment: Yes Reason for Co-Treatment: Complexity of the patient's impairments (multi-system involvement);Necessary to address cognition/behavior during functional activity;To address functional/ADL transfers;For patient/therapist safety PT goals addressed during session: Mobility/safety with mobility;Balance OT goals addressed during session: ADL's and self-care      AM-PAC PT "6 Clicks" Mobility   Outcome Measure  Help needed turning from your back to your side while in a flat bed without using bedrails?: None Help needed moving from lying on your back to sitting on the side of a flat bed without using bedrails?: A Little Help needed moving to and from a bed to a chair (including a wheelchair)?: A Lot Help needed standing up from a chair using your arms (e.g., wheelchair or bedside chair)?: A Lot Help needed to walk in hospital room?: Total Help needed climbing 3-5 steps with a railing? : Total 6 Click Score: 13    End of Session Equipment Utilized During Treatment: Oxygen(trach collar) Activity Tolerance: Treatment limited secondary to medical complications (Comment) Patient left: with nursing/sitter in room(RN and sitter in room)   PT Visit Diagnosis: Muscle weakness (generalized) (M62.81);Difficulty in walking, not elsewhere classified (R26.2)     Time: [id number] PT Time Calculation (min) (ACUTE ONLY): 29 min  Charges:  $Therapeutic Activity: 8-22 mins                     Magda Kiel, [place] [facility] [phone] 07/26/2019    Reginia Naas 07/26/2019, 11:51 AM

## 2019-07-26 NOTE — Plan of Care (Signed)
  Problem: Safety: Goal: Ability to remain free from injury will improve Outcome: Progressing  Pt has 1:1 sitter today due to fall overnight. Hourly rounding. Restraints discontinued. Spare trach at bedside

## 2019-07-26 NOTE — Progress Notes (Signed)
Cortrak Tube Team Note:  Per RN patient pulled bridle out and Cortrak tube showed to be at 35 cm in R nare . RN advanced tube to 65 cm prior to RDs arrival. Advised nurse to obtain KUB before using tube. RD replaced bridle at bedside.   Mariana Single RD, LDN Clinical Nutrition Pager listed in [place]

## 2019-07-26 NOTE — Progress Notes (Signed)
RN called radiology to check status of ordered XRAYS. Appointment scheduled for 1500 today. TF remains off until KUB results due to patient pulling NG out partially.

## 2019-07-26 NOTE — Progress Notes (Signed)
Occupational Therapy Treatment Patient Details Name: [name]. MRN: [id number] DOB: 03-10-1961 Today's Date: 07/26/2019    History of present illness Pt is a 59 y.o. male admitted 06/12/19 for SOB and neck swelling. Admitted to ICU with loss of airway and PEA arrest requiring emergent trach; has been difficult to wean off sedation. 1/15 R foot MRI suggestive of cellulitis, s/p aspiration. 1/30 return to ICU for hypercarbic respiratory failure and encephalopathy. 2/06 on trach collar 72 hrs and vent liberated; 2/09 orders to leave ICU for progressive care  PMH includes recent admission for pancreatitis (04/10/19), ETOH and tobacco use, HTN.   OT comments  Pt making good gains toward OT goals this session. Co-treatment with PT for therapist safety, to address transfers, and for line management. Pt able to complete ADL transfers and toileting tasks with mod A +2. Pt still presents with cognitive deficits that impact his awareness, safety, and decision making. SNF remains appropriate d/c.    Follow Up Recommendations  SNF;Supervision/Assistance - 24 hour    Equipment Recommendations  None recommended by OT       Precautions / Restrictions Precautions Precautions: Fall Precaution Comments: trach collar, cortrak feeding tube;  bilat mittens; posey wrist restraints Restrictions Weight Bearing Restrictions: No       Mobility Bed Mobility Overal bed mobility: Needs Assistance Bed Mobility: Supine to Sit;Sit to Supine     Supine to sit: Min assist;+2 for safety/equipment;HOB elevated Sit to supine: Min guard;+2 for safety/equipment   General bed mobility comments: +2 needed for line management and safety  Transfers Overall transfer level: Needs assistance Equipment used: 2 person hand held assist;Rolling walker (2 wheeled) Transfers: Sit to/from [facility] Sit to Stand: +2 physical assistance;Mod assist;+2 safety/equipment Stand pivot transfers: Mod assist             Balance Overall balance assessment: Needs assistance Sitting-balance support: Single extremity supported;Feet unsupported Sitting balance-Leahy Scale: Fair     Standing balance support: Bilateral upper extremity supported Standing balance-Leahy Scale: Poor Standing balance comment: reliance on BUE support, several LOB without UE supported                           ADL either performed or assessed with clinical judgement   ADL Overall ADL's : Needs assistance/impaired Eating/Feeding: NPO   Grooming: Wash/dry face;Set up;Sitting                   Toilet Transfer: Moderate assistance;+2 for physical assistance;+2 for safety/equipment;Cueing for safety;Stand-pivot Toilet Transfer Details (indicate cue type and reason): Pt transferred to [place] and Hygiene: +2 for physical assistance;+2 for safety/equipment;Moderate assistance;Sit to/from stand;Cueing for safety;Cueing for sequencing Toileting - Clothing Manipulation Details (indicate cue type and reason): Pt very unsteady when performing peri hygiene, requiring max A for balance        General ADL Comments: Cueing needed for impulsivity throughout session               Cognition Arousal/Alertness: Awake/alert Behavior During Therapy: Restless;Impulsive Overall Cognitive Status: Difficult to assess Area of Impairment: Safety/judgement;Following commands;Awareness;Problem solving                   Current Attention Level: Sustained Memory: Decreased short-term memory;Decreased recall of precautions Following Commands: Follows one step commands consistently;Follows multi-step commands inconsistently Safety/Judgement: Decreased awareness of safety;Decreased awareness of deficits Awareness: Intellectual Problem Solving: Requires verbal cues;Requires tactile cues General Comments: Pt with improved  direction following, appropriate interactions with staff, however  still impulsive              General Comments SpO2 > 95% on trach collar (deflated and with PMSV) 28% FiO2, 5 L.     Pertinent Vitals/ Pain       Pain Assessment: Faces Faces Pain Scale: Hurts even more Pain Location: R thigh  Pain Descriptors / Indicators: Discomfort;Guarding Pain Intervention(s): Monitored during session         Frequency  Min 2X/week        Progress Toward Goals  OT Goals(current goals can now be found in the care plan section)  Progress towards OT goals: Progressing toward goals  Acute Rehab OT Goals Patient Stated Goal: None stated OT Goal Formulation: Patient unable to participate in goal setting Time For Goal Achievement: 08/04/19 Potential to Achieve Goals: [place] Discharge plan remains appropriate;Frequency remains appropriate    Co-evaluation    PT/OT/SLP Co-Evaluation/Treatment: Yes Reason for Co-Treatment: Complexity of the patient's impairments (multi-system involvement);For patient/therapist safety;To address functional/ADL transfers;Necessary to address cognition/behavior during functional activity   OT goals addressed during session: ADL's and self-care      AM-PAC OT "6 Clicks" Daily Activity     Outcome Measure   Help from another person eating meals?: Total Help from another person taking care of personal grooming?: A Lot Help from another person toileting, which includes using toliet, bedpan, or urinal?: A Lot Help from another person bathing (including washing, rinsing, drying)?: A Lot Help from another person to put on and taking off regular upper body clothing?: A Lot Help from another person to put on and taking off regular lower body clothing?: A Lot 6 Click Score: 11    End of Session Equipment Utilized During Treatment: Rolling walker;Oxygen  OT Visit Diagnosis: Other abnormalities of gait and mobility (R26.89);Muscle weakness (generalized) (M62.81);Other symptoms and signs involving cognitive function Pain  - Right/Left: Right Pain - part of body: Leg   Activity Tolerance Patient tolerated treatment well   Patient Left in bed;with call bell/phone within reach;with nursing/sitter in room   Nurse Communication Mobility status        Time: [id number] OT Time Calculation (min): 29 min  Charges: OT General Charges $OT Visit: 1 Visit OT Treatments $Self Care/Home Management : 8-22 mins   Curtis Sites OTR/L  07/26/2019, 11:00 AM

## 2019-07-26 NOTE — Progress Notes (Addendum)
PULMONARY / CRITICAL CARE MEDICINE  NAME:  [name]., MRN:  [id number], DOB:  [birth year], LOS: 34 ADMISSION DATE:  06/12/2019, CONSULTATION DATE:  06/12/2019 REFERRING MD:  Alinda Sierras, CHIEF COMPLAINT:  Shortness of breath, sore throat, and neck swelling  BRIEF HISTORY:    59 yo male smoker with hx of ETOH had admission in November 2020 for pancreatitis and hyponatremia presented with dyspnea and neck swelling from cellulitis with acute airway compromise resulting in PEA requiring emergent tracheostomy.  Found to have H influenza bacteremia.  SIGNIFICANT PAST MEDICAL HISTORY   ETOH, Pneumonia, COPD, Rt lung empyema, Hep C, Pancreatitis  SIGNIFICANT EVENTS:  1/04 Lost airway and PEA arrest on, requiring emergent tracheostomy and ROSC obtained 1/05 Blood cultures 4/4 growing H.Influenza , narrowed to Unasyn 1/5 - 1/14 Continued on antibiotics without any significant improvement  1/14 New R foot swelling for which MRI obtained due to concerns for seeding cellulitis from neck 1/15 MRI with synovitis and subcutaneous edema suggestive of cellulitis; orthopedics consulted for right ankle aspiration - aspirate with positive crystals suggestive of gout - patient started on NSAIDs and steroids 1/16 Started on dilaudid, D/C fentanyl  1/17 Patient agitated early AM, q2h prn versed prescribed 1/18 Hb 5.2, transfused 2u pRBC; started methadone  1/19 Klonopin increased to tid and metoprolol added for SVTs 1/20 precedex gtt discontinued overnight for concerns of bradycardia  1/21 clonidine taper, 24hr trach collar 1/22 continuing trach collar 1/23 last day of unasyn for cellulitis, steroid taper and colchicine for gout, continuing trach collar  1/24 72 hours of trach collar 1/25 changed to 4 cuffless 1/26 sedation cut back 1/30 returned to ICU for hypercarbic respiratory failure and encephalopathy 2/2 inadvertent high dose of methadone and resultant resp faillure. Trach positioned in [place] so  underwent re-do 2/06 liberated from vent. Has been TC for 72 hours. 2/09 off precedex, transfer to progressive care 2/11 failed MBS 2/12 Required tracheal suctioning, decided to hold off on tracheostomy change over the weekend  CULTURES:  Influenza A / B 1/4 >> Negative Sars Coronavirus 1/4 >> Negative Blood cultures 1/4 >> 4/4 Positive for H. Influenza  Urine culture 1/4 >> no growth BCx2 1/6 >> negative  Tracheal aspirate 1/13 >> few klebsiella pneumoniae  R ankle aspirate 1/16 >> monosodium urate crystals, no organisms on gram stain  Sputum culture 1/27 >> klebsiella >> S-cefazolin, cefepime  ANTIBIOTICS:  Bactrim 1/4 Azithromycin 1/4  Vancomycin 1/4 Zosyn  1/4 one dose Clindamycin 1/4 one dose Unasyn 1/5 >> 1/23  Ceftriaxone 1/29 (for klebsiella) >>   LINES/TUBES:  Tracheostomy 1/4 >> 1/30 (redo), 1/30 >> PICC line double lumen - placed 1/15  CONSULTANTS:  Infectious Disease  ENT  Orthopedic surgery  SUBJECTIVE:  Safety sitter at bedside No acute events overnight   5L / 21% FiO2 by trach collar   OBJECTIVE:       CONSTITUTIONAL: BP 138/73 (BP Location: Right Leg)   Pulse 68   Temp 98.1 F (36.7 C) (Oral)   Resp (!) 21   Ht 5\' 8"  (1.727 m)   Wt 70.2 kg   SpO2 97%   BMI 23.54 kg/m   I/O last 3 completed shifts: In: 2797 [NG/GT:2797] Out: 200 [Urine:100; Stool:100]   FiO2 (%):  [21 %-28 %] 21 %   PHYSICAL EXAM: General: ill appearing adult male lying in bed in NAD HEENT: MM pink/dry, trach midline c/d/i Neuro: Awake, alert, follows commands, oriented to self, place, asking for water CV: s1s2 RRR, no m/r/g  PULM:  Non-labored, lungs bilaterally clear, creamy secretions from trach  GI: soft, bsx4 active  Extremities: warm/dry, no edema  Skin: no rashes or lesions  RESOLVED PROBLEMS:  Klebsiella HCAP, Aspiration pneumonia  ASSESSMENT AND PLAN    Cellulitis of the Neck in setting of H influenza bacteremia  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 Per wife was set to see GI as outpatient for "esophageal stretching".  Increased secretions remain barrier to downsize at this point.   Plan -continue PMV efforts with SLP  -hold trach change until secretions improve, re-eval for trach change later in week / early next week  -trach care per protocol  -aspiration precautions  -PT efforts / mobilize -continue doxycycline x5 days  -PT / OT efforts  -appreciate SLP evaluation  -follow intermittent CXR, repeat on 2/19  PCCM will continue to follow.        Noe Gens, MSN, NP-C [facility] 07/26/2019, 1:26 PM   Please see [email or web address] for pager details.   PCCM attending:  59 year old history of alcohol abuse pancreatitis had soft tissue skin infection cellulitis of the neck with airway compromise requiring tracheostomy found to have H. influenzae bacteremiaProlonged hospital course with respiratory failure.  Requiring tracheostomy tube.  Patient able to communicate around his trach.  Was started on doxycycline 2 days ago for tracheitis.  BP 138/73 (BP Location: Right Leg)   Pulse 68   Temp 98.1 F (36.7 C) (Oral)   Resp (!) 21   Ht 5\' 8"  (1.727 m)   Wt 70.2 kg   SpO2 97%   BMI 23.54 kg/m   General: No acute distress lying in bed with sitter HEENT: Core track in place, tracheostomy tube in place, clean dry and intact no redness no bloody secretions no pus. Lungs: Clear to auscultation bilaterally Heart: Regular rhythm S1-S2  Assessment: Tracheostomy dependence Chronic respiratory failure  Plan: Continue PMV usage with SLP [employer] at this time on additional trach exchange We  will consider doing this end of this week or maybe next week. Complete 5 days of doxycycline for tracheitis that was started a few days ago. Continue PT OT Continue aspiration precautions We appreciate SLP input and evaluation. Pulmonary will continue to follow for trach care  [facility] 07/26/2019 1:48 PM

## 2019-07-26 NOTE — Progress Notes (Signed)
[name]  PROGRESS NOTE    [name].  [id number] DOB: 1960-11-02 DOA: 06/12/2019 PCP: Alvester Chou, NP   Brief Narrative:   59 y.o.malewith a history of alcohol/tobacco abuse, COPD, hyponatremia,empyema in June 2020 and admission for acute pancreatitis in November 2725 complicated by respiratory failure requiring tracheostomy at that time presented on 06/12/2019 with neck swelling found to have cellulitis with acute airway compromise resulting in PEA arrest requiring emergent tracheostomy. Neck CTA-thickening epiglottis with pharyngeal and supraglottic wall edema,also multifocal pneumonia by CT-recieved empiric antibiotics, racemic epinephrine with Decadron per ENT recommendation. Blood cultures grew H.Influenza 4/4 , abx changed to Unasyn 1/5 >1/14 without any significant improvement. New right foot swelling noted on 1/14- MRI obtained to rule out infectious emboli-it showed synovitis and subcutaneous edema suggestive of cellulitis; orthopedics consulted for right ankle aspiration - aspirate with positive crystals suggestive of gout - patient started on NSAIDs and steroids. Pain meds titrated ( Started on dilaudid, methadone, D/Ced fentanyl). Patient completed Unasyn course on 1/23.  HC complicated by delirium/agitation requiring versed/klonapin/precedex gtt while in ICU, anemia requiring transfusion, elevated LFTs, and paroxysmal SVTs. On 1/17: Patient agitated early AM, q2h prn versed prescribed. Hb 5.2, transfused 2u pRBC; started methadone . On 1/19: Klonopin increased to tid and metoprolol added for SVTs but patient had subsequent bradycardia, clonidine tapered,precedex gtt discontinued . 1/21-1/24--placed on trach collar, then changed to 4 cuffless and sedation cut back slowly. However, on 1/30 patient returned to ICU for hypercarbic respiratory failure and encephalopathy--finally liberated from ventilator on 2/06- takenoff precedex and transferred to progressive care/ [facility] on 2/09. PCCM  continuing tracheostomy care, remains on trach collar. 2/11 Failed swallow study, PICC line removed 2/12. Clonazepam/methadone dosing being tapered down. Per wife, patient has history of dysphagia, suspected to have esophageal stenosis and was supposed to follow-up with GI for "stretching" as outpatient. Losartan/diltiazem discontinued 2/14 and patient given 250 normal saline bolus for hypotension.Seen by PCCM, ID, ENT and ortho during [facility].   07/26/19: Agitation ON. Was somewhat better this AM. Pulled his NG today and complained of RLE pain after fall from last night. Imaging is negative. Continue current pain regimen. Resume seroquel at previous dose. Needs placement.    Assessment & Plan:   Principal Problem:   Neck infection Active Problems:   Alcohol abuse   Poor dentition   Septic shock (HCC)   Laryngeal edema   Multifocal pneumonia   Airway compromise   Chronic hepatitis C without hepatic coma (HCC)   Acute respiratory failure with hypoxemia (HCC)   Cardiac arrest, cause unspecified (HCC)   Haemophilis influenzae bacteremia    Malnutrition of moderate degree   Atelectasis   Tracheostomy care ([place])   Acute metabolic encephalopathy   Hypercarbia   Tracheostomy status (HCC)   Pressure injury of skin  Acute hypoxic/hypercarbic respiratory failure, present on admission Neck cellulitus/swelling, tracheitis Multifocal PNA H. influenza bacteremia     - now tracheostomy dependent     - Patient presented with neck swelling/infection with tracheitis and airway compromise.     - He received Decadron/racemic epinephrine per ENT recommendations and was intubated.       - Ended up with tracheostomy for failure to liberate from the vent.     - Completed IV unasyn on 1/23       - PCCM onboard, appreciate assistance, continues to have excess secretions, defer to PCCM.     - continue doxycycline  Acute metabolic encephalopathy     - In the  setting of above.      - He also has history of  alcohol abuse/opiate dependence.       - Continue supportive care.       - Delirium precautions.     - Methadone 5 mg BID; Methadone being tapered down while titrating clonidine.     - return seroquel dose to 29m BID d/t agitation ON       - also has Seroquel/Haldol additional dosing available as needed for agitation.  Dysphagia, history of esophageal stenosis      - Speech therapy following along     - continue NPO for now  Hypertension with hypotensive episode on 2/14     - Received IV fluid bolus.     - losartan, diltiazem discontinued     - remains on metoprolol and clonidine.     - BP up and down today; can consider low dose losartan in AM if BP continues this trend  COPD/tobacco use     - Continue nicotine patch.     - albuterol as needed.  PSVT     - Remains on beta-blockers.       - Off diltiazem.       - Patient did have PEA cardiac arrest on presentation requiring emergent tracheostomy.  Alcohol abuse     - Out of window for alcohol withdrawal.       - Continue multivitamin/thiamine.  Moderate protein calorie malnutrition with hypoalbuminemia     - Due to prolonged hospitalization and n.p.o. status/confusion.       - Albumin level 2.9-3.1.       - On tube feeds currently.     - SLP onboard  DVT prophylaxis: heparin Code Status: FULL Family Communication: None at bedside   Disposition Plan: Needs placement at SNF or LTACH  Consultants:   Pulmonology  Procedures:   Trach  Antimicrobials:   doxy   ROS:  Denies CP, N, V. Reports copious secretions. Remainder 10-pt ROS is negative for all not previously mentioned.  Subjective: Agitation and fall ON per nursing.  Objective: Vitals:   07/26/19 1204 07/26/19 1543 07/26/19 1551 07/26/19 1623  BP: 138/73     Pulse: 68  73   Resp: (!) 21     Temp: 98.1 F (36.7 C)  98.6 F (37 C)   TempSrc: Oral  Oral   SpO2: 97% 97%  95%  Weight:      Height:        Intake/Output Summary (Last 24 hours)  at 07/26/2019 1650 Last data filed at 07/26/2019 1630 Gross per 24 hour  Intake 1590 ml  Output 0 ml  Net 1590 ml   Filed Weights   07/14/19 0500 07/18/19 0427 07/21/19 0342  Weight: 69.7 kg 70.5 kg 70.2 kg    Examination:  General: 59y.o. male resting in bed in NAD, trached Cardiovascular: RRR, +S1, S2, no m/g/r, equal pulses throughout Respiratory: clear, normal WOB, copious secretions noted GI: BS+, NDNT, no masses noted, NGT place MSK: No e/c/c Neuro: alert and following commands   Data Reviewed: I have personally reviewed following labs and imaging studies.  CBC: Recent Labs  Lab 07/20/19 0650 07/23/19 0225 07/25/19 0141 07/26/19 0950  WBC 10.2 8.3 8.2 9.3  NEUTROABS  --   --   --  6.7  HGB 11.0* 11.5* 11.3* 11.8*  HCT 37.2* 39.6 37.6* 38.9*  MCV 85.9 87.0 84.7 85.3  PLT 273 314 369 4465   Basic Metabolic Panel:  Recent Labs  Lab 07/20/19 0650 07/23/19 0225 07/25/19 0141 07/26/19 0950  NA 139 137 136 139  K 4.4 4.5 4.2 4.5  CL 99 99 100 99  CO2 27 23 25 25   GLUCOSE 118* 117* 94 119*  BUN 22* 22* 23* 25*  CREATININE 0.72 0.60* 0.58* 0.61  CALCIUM 9.9 10.0 10.1 10.2  PHOS  --   --   --  5.0*   GFR: Estimated Creatinine Clearance: 98.6 mL/min (by C-G formula based on SCr of 0.61 mg/dL). Liver Function Tests: Recent Labs  Lab 07/20/19 0650 07/23/19 0225 07/26/19 0950  AST 104* 82*  --   ALT 273* 177*  --   ALKPHOS 529* 350*  --   BILITOT 0.7 0.5  --   PROT 7.1 7.4  --   ALBUMIN 2.9* 3.1* 3.2*   No results for input(s): LIPASE, AMYLASE in the last 168 hours. No results for input(s): AMMONIA in the last 168 hours. Coagulation Profile: No results for input(s): INR, PROTIME in the last 168 hours. Cardiac Enzymes: No results for input(s): CKTOTAL, CKMB, CKMBINDEX, TROPONINI in the last 168 hours. BNP (last 3 results) No results for input(s): PROBNP in the last 8760 hours. HbA1C: No results for input(s): HGBA1C in the last 72  hours. CBG: Recent Labs  Lab 07/25/19 2104 07/25/19 2328 07/26/19 0549 07/26/19 0722 07/26/19 1225  GLUCAP 86 100* 107* 113* 104*   Lipid Profile: No results for input(s): CHOL, HDL, LDLCALC, TRIG, CHOLHDL, LDLDIRECT in the last 72 hours. Thyroid Function Tests: No results for input(s): TSH, T4TOTAL, FREET4, T3FREE, THYROIDAB in the last 72 hours. Anemia Panel: No results for input(s): VITAMINB12, FOLATE, FERRITIN, TIBC, IRON, RETICCTPCT in the last 72 hours. Sepsis Labs: No results for input(s): PROCALCITON, LATICACIDVEN in the last 168 hours.  No results found for this or any previous visit (from the past 240 hour(s)).    Radiology Studies: DG Knee 1-2 Views Right  Result Date: 07/26/2019 CLINICAL DATA:  Right leg pain EXAM: RIGHT FEMUR 2 VIEWS; RIGHT KNEE - 1-2 VIEW COMPARISON:  None. FINDINGS: No acute fracture or malalignment of the right femur. Right hip joint space is relatively preserved. Mild tricompartmental degenerative changes of the right knee, most pronounced within the medial compartment. Ossification adjacent to the medial femoral condyle likely reflecting sequela of remote MCL injury. No knee joint effusion. Scattered vascular calcifications. Soft tissues are otherwise within normal limits. IMPRESSION: No acute osseous abnormality of the right femur or knee. Electronically Signed   By: Davina Poke D.O.   On: 07/26/2019 15:57   DG Abd 1 View  Result Date: 07/26/2019 CLINICAL DATA:  Abdominal swelling EXAM: ABDOMEN - 1 VIEW COMPARISON:  07/23/2019 FINDINGS: Enteric tubing courses below the diaphragm with distal tip terminating in the expected location of the gastric body. Nonobstructive bowel gas pattern. IMPRESSION: Nonobstructive bowel gas pattern. Electronically Signed   By: Davina Poke D.O.   On: 07/26/2019 15:55   DG FEMUR, MIN 2 VIEWS RIGHT  Result Date: 07/26/2019 CLINICAL DATA:  Right leg pain EXAM: RIGHT FEMUR 2 VIEWS; RIGHT KNEE - 1-2 VIEW  COMPARISON:  None. FINDINGS: No acute fracture or malalignment of the right femur. Right hip joint space is relatively preserved. Mild tricompartmental degenerative changes of the right knee, most pronounced within the medial compartment. Ossification adjacent to the medial femoral condyle likely reflecting sequela of remote MCL injury. No knee joint effusion. Scattered vascular calcifications. Soft tissues are otherwise within normal limits. IMPRESSION: No acute osseous abnormality  of the right femur or knee. Electronically Signed   By: Davina Poke D.O.   On: 07/26/2019 15:57     Scheduled Meds:  bacitracin  1 application Topical BID   bethanechol  5 mg Per Tube TID   chlorhexidine gluconate (MEDLINE KIT)  15 mL Mouth Rinse BID   Chlorhexidine Gluconate Cloth  6 each Topical Daily   cloNIDine  0.2 mg Per Tube TID   doxycycline  100 mg Oral Q12H   famotidine  20 mg Per Tube BID   feeding supplement (PRO-STAT SUGAR FREE 64)  30 mL Per Tube BID   folic acid  1 mg Per Tube Daily   guaiFENesin  15 mL Per Tube Q6H   heparin injection (subcutaneous)  5,000 Units Subcutaneous Q8H   insulin aspart  0-15 Units Subcutaneous Q4H   mouth rinse  15 mL Mouth Rinse 10 times per day   methadone  5 mg Per Tube Q12H   metoprolol tartrate  75 mg Per Tube BID   multivitamin  15 mL Oral Daily   nicotine  14 mg Transdermal Daily   polyethylene glycol  17 g Per Tube Daily   QUEtiapine  200 mg Per Tube BID   sodium chloride flush  10-40 mL Intracatheter Q12H   thiamine  100 mg Per Tube Daily   Continuous Infusions:  sodium chloride Stopped (07/17/19 2053)   feeding supplement (OSMOLITE 1.2 CAL) 1,000 mL (07/26/19 1630)   sodium chloride       LOS: 44 days    Time spent: 25 minutes spent in the coordination of care today.    Jonnie Finner, DO [facility]  If 7PM-7AM, please contact night-coverage [email or web address] 07/26/2019, 4:50 PM

## 2019-07-27 LAB — COMPREHENSIVE METABOLIC PANEL
ALT: 99 U/L — ABNORMAL HIGH (ref 0–44)
AST: 41 U/L (ref 15–41)
Albumin: 3 g/dL — ABNORMAL LOW (ref 3.5–5.0)
Alkaline Phosphatase: 199 U/L — ABNORMAL HIGH (ref 38–126)
Anion gap: 11 (ref 5–15)
BUN: 28 mg/dL — ABNORMAL HIGH (ref 6–20)
CO2: 26 mmol/L (ref 22–32)
Calcium: 9.9 mg/dL (ref 8.9–10.3)
Chloride: 102 mmol/L (ref 98–111)
Creatinine, Ser: 0.82 mg/dL (ref 0.61–1.24)
GFR calc Af Amer: >60 mL/min (ref >60)
GFR calc non Af Amer: >60 mL/min (ref >60)
Glucose, Bld: 121 mg/dL — ABNORMAL HIGH (ref 70–99)
Potassium: 4.2 mmol/L (ref 3.5–5.1)
Sodium: 139 mmol/L (ref 135–145)
Total Bilirubin: 0.7 mg/dL (ref 0.3–1.2)
Total Protein: 7.5 g/dL (ref 6.5–8.1)

## 2019-07-27 LAB — CBC WITH DIFFERENTIAL/PLATELET
Abs Immature Granulocytes: 0.02 10*3/uL (ref 0.00–0.07)
Basophils Absolute: 0.1 10*3/uL (ref 0.0–0.1)
Basophils Relative: 1 %
Eosinophils Absolute: 0.2 10*3/uL (ref 0.0–0.5)
Eosinophils Relative: 2 %
HCT: 36.1 % — ABNORMAL LOW (ref 39.0–52.0)
Hemoglobin: 11 g/dL — ABNORMAL LOW (ref 13.0–17.0)
Immature Granulocytes: 0 %
Lymphocytes Relative: 26 %
Lymphs Abs: 2.2 10*3/uL (ref 0.7–4.0)
MCH: 25.9 pg — ABNORMAL LOW (ref 26.0–34.0)
MCHC: 30.5 g/dL (ref 30.0–36.0)
MCV: 85.1 fL (ref 80.0–100.0)
Monocytes Absolute: 0.8 10*3/uL (ref 0.1–1.0)
Monocytes Relative: 10 %
Neutro Abs: 5.3 10*3/uL (ref 1.7–7.7)
Neutrophils Relative %: 61 %
Platelets: 413 10*3/uL — ABNORMAL HIGH (ref 150–400)
RBC: 4.24 MIL/uL (ref 4.22–5.81)
RDW: 19 % — ABNORMAL HIGH (ref 11.5–15.5)
WBC: 8.6 10*3/uL (ref 4.0–10.5)
nRBC: 0 % (ref 0.0–0.2)

## 2019-07-27 LAB — GLUCOSE, CAPILLARY
Glucose-Capillary: 115 mg/dL — ABNORMAL HIGH (ref 70–99)
Glucose-Capillary: 117 mg/dL — ABNORMAL HIGH (ref 70–99)
Glucose-Capillary: 154 mg/dL — ABNORMAL HIGH (ref 70–99)
Glucose-Capillary: 117 mg/dL — ABNORMAL HIGH (ref 70–99)
Glucose-Capillary: 98 mg/dL (ref 70–99)

## 2019-07-27 LAB — MAGNESIUM: Magnesium: 1.9 mg/dL (ref 1.7–2.4)

## 2019-07-27 NOTE — Progress Notes (Signed)
CSW following for discharge needs. Current SNF barriers include Medicaid, Trach, Cortrak, restraints, and Methadone.   Percell Locus [place] LCSW [phone]

## 2019-07-27 NOTE — Progress Notes (Signed)
[name]  PROGRESS NOTE    [name].  NWG:[id number] DOB: June 13, 1960 DOA: 06/12/2019 PCP: Alvester Chou, NP   Brief Narrative:   59 y.o.malewith a history of alcohol/tobacco abuse, COPD, hyponatremia,empyema in June 2020 and admission for acute pancreatitis in November 5784 complicated by respiratory failure requiring tracheostomy at that time presented on 06/12/2019 with neck swelling found to have cellulitis with acute airway compromise resulting in PEA arrest requiring emergent tracheostomy.Neck CTA-thickening epiglottis with pharyngeal and supraglottic wall edema,also multifocal pneumonia by CT-recieved empiric antibiotics, racemic epinephrine with Decadron per ENT recommendation. Blood cultures grew H.Influenza 4/4 , abx changed to Unasyn 1/5 >1/14 without any significant improvement. New right foot swelling noted on 1/14- MRI obtained to rule out infectious emboli-it showed synovitis and subcutaneous edema suggestive of cellulitis; orthopedics consulted for right ankle aspiration - aspirate with positive crystals suggestive ofgout- patient started on NSAIDs and steroids. Pain meds titrated ( Started on dilaudid, methadone,D/Cedfentanyl). Patient completed Unasyn course on 1/23.  HC complicated by delirium/agitation requiring versed/klonapin/precedex gtt while in ICU, anemiarequiring transfusion, elevated LFTs,and paroxysmal SVTs.On 1/17: Patient agitated early AM, q2h prn versed prescribed. Hb 5.2, transfused 2u pRBC; started methadone . On 1/19: Klonopin increased to tid and metoprolol added for SVTs but patient had subsequent bradycardia, clonidine tapered,precedex gtt discontinued . 1/21-1/24--placed on trach collar, then changed to 4 cuffless and sedation cut back slowly. However, on 1/30 patient returned to ICU for hypercarbic respiratory failure and encephalopathy--finally liberated from ventilator on 2/06- takenoff precedex and transferred to progressive care/ [facility] on 2/09. PCCM  continuingtracheostomy care, remains on trach collar. 2/11 Failed swallow study, PICC line removed 2/12. Clonazepam/methadone dosing being tapered down.Per wife,patient has history of dysphagia, suspected to have esophageal stenosis and was supposed to follow-up with GI for "stretching" as outpatient. Losartan/diltiazem discontinued2/14and patient given 250 normal saline bolus for hypotension.Seen by PCCM, ID, ENT and ortho during [facility].   07/27/19: Spoke with pulm about secretions. Needs to continue suction. Not recommending robinul at this time.    Assessment & Plan:   Principal Problem:   Neck infection Active Problems:   Alcohol abuse   Poor dentition   Septic shock (HCC)   Laryngeal edema   Multifocal pneumonia   Airway compromise   Chronic hepatitis C without hepatic coma (HCC)   Acute respiratory failure with hypoxemia (HCC)   Cardiac arrest, cause unspecified (HCC)   Haemophilis influenzae bacteremia    Malnutrition of moderate degree   Atelectasis   Tracheostomy care ([name])   Acute metabolic encephalopathy   Hypercarbia   Tracheostomy status (HCC)   Pressure injury of skin  Acute hypoxic/hypercarbic respiratory failure, present on admission Neck cellulitus/swelling, tracheitis Multifocal PNA H. influenza bacteremia     - now tracheostomy dependent     - Patient presented with neck swelling/infection with tracheitis and airway compromise.     - He received Decadron/racemic epinephrine per ENT recommendations and was intubated.       - Ended up with tracheostomy for failure to liberate from the vent.     - Completed IV unasyn on 1/23       - PCCM onboard, appreciate assistance, continues to have excess secretions, defer to PCCM.     - continue doxycycline     - 07/27/19: continue doxy through tomorrow; wean O2, trach as able  Acute metabolic encephalopathy     - In the setting of above.      - He also has history of alcohol abuse/opiate dependence.       -  Continue  supportive care.       - Delirium precautions.     - Methadone 5 mg BID; Methadone being tapered down while titrating clonidine.     - return seroquel dose to 274m BID d/t agitation ON       - also has Seroquel/Haldol additional dosing available as needed for agitation.     - 07/27/19: still intermittent agitation; monitor  Dysphagia, history of esophageal stenosis      - Speech therapy following along     - continue NPO for now  Hypertension with hypotensive episode on 2/14     - Received IV fluid bolus.     - losartan, diltiazem discontinued     - remains on metoprolol and clonidine.     - BP up and down today; can consider low dose losartan in AM if BP continues this trend     - 07/27/19: BP is ok today  COPD/tobacco use     - Continue nicotine patch.     - albuterol as needed.  PSVT     - Remains on beta-blockers.       - Off diltiazem.       - Patient did have PEA cardiac arrest on presentation requiring emergent tracheostomy.  Alcohol abuse     - Out of window for alcohol withdrawal.       - Continue multivitamin/thiamine.  Moderate protein calorie malnutrition with hypoalbuminemia     - Due to prolonged hospitalization and n.p.o. status/confusion.       - Albumin level 2.9-3.1.       - On tube feeds currently.     - SLP onboard  DVT prophylaxis: heparin Code Status: FULL Family Communication: None at bedside   Disposition Plan: Needs placement at SNF or LTACH  Consultants:   PCCM  Procedures:   Trach  Antimicrobials:  . Doxy   Subjective: Intermittent agitation per nursing.  Objective: Vitals:   07/26/19 2350 07/27/19 0305 07/27/19 0710 07/27/19 0738  BP: 112/62     Pulse:   72   Resp: 19  (!) 24   Temp:  97.9 F (36.6 C)  98.2 F (36.8 C)  TempSrc:  Oral  Oral  SpO2:   94%   Weight:      Height:        Intake/Output Summary (Last 24 hours) at 07/27/2019 1555 Last data filed at 07/26/2019 1734 Gross per 24 hour  Intake 241 ml    Output 0 ml  Net 241 ml   Filed Weights   07/14/19 0500 07/18/19 0427 07/21/19 0342  Weight: 69.7 kg 70.5 kg 70.2 kg    Examination:  General: 59y.o. male resting in bed in NAD Cardiovascular: RRR, +S1, S2, no m/g/r Respiratory: secretions noted, trach noted, UAT, upper field rhonchi, bases clear GI: BS+, NDNT, no masses noted, soft, NGT in place MSK: No e/c/c Skin: No rashes, bruises, ulcerations noted Neuro: Alert and following commands   Data Reviewed: I have personally reviewed following labs and imaging studies.  CBC: Recent Labs  Lab 07/23/19 0225 07/25/19 0141 07/26/19 0950 07/27/19 0228  WBC 8.3 8.2 9.3 8.6  NEUTROABS  --   --  6.7 5.3  HGB 11.5* 11.3* 11.8* 11.0*  HCT 39.6 37.6* 38.9* 36.1*  MCV 87.0 84.7 85.3 85.1  PLT 314 369 427* 4528   Basic Metabolic Panel: Recent Labs  Lab 07/23/19 0225 07/25/19 0141 07/26/19 0950 07/27/19 0228  NA 137 136 139 139  K 4.5 4.2 4.5 4.2  CL 99 100 99 102  CO2 23 25 25 26   GLUCOSE 117* 94 119* 121*  BUN 22* 23* 25* 28*  CREATININE 0.60* 0.58* 0.61 0.82  CALCIUM 10.0 10.1 10.2 9.9  MG  --   --   --  1.9  PHOS  --   --  5.0*  --    GFR: Estimated Creatinine Clearance: 96.2 mL/min (by C-G formula based on SCr of 0.82 mg/dL). Liver Function Tests: Recent Labs  Lab 07/23/19 0225 07/26/19 0950 07/27/19 0228  AST 82*  --  41  ALT 177*  --  99*  ALKPHOS 350*  --  199*  BILITOT 0.5  --  0.7  PROT 7.4  --  7.5  ALBUMIN 3.1* 3.2* 3.0*   No results for input(s): LIPASE, AMYLASE in the last 168 hours. No results for input(s): AMMONIA in the last 168 hours. Coagulation Profile: No results for input(s): INR, PROTIME in the last 168 hours. Cardiac Enzymes: No results for input(s): CKTOTAL, CKMB, CKMBINDEX, TROPONINI in the last 168 hours. BNP (last 3 results) No results for input(s): PROBNP in the last 8760 hours. HbA1C: No results for input(s): HGBA1C in the last 72 hours. CBG: Recent Labs  Lab  07/26/19 2034 07/26/19 2334 07/27/19 0309 07/27/19 0729 07/27/19 1241  GLUCAP 75 127* 115* 117* 154*   Lipid Profile: No results for input(s): CHOL, HDL, LDLCALC, TRIG, CHOLHDL, LDLDIRECT in the last 72 hours. Thyroid Function Tests: No results for input(s): TSH, T4TOTAL, FREET4, T3FREE, THYROIDAB in the last 72 hours. Anemia Panel: No results for input(s): VITAMINB12, FOLATE, FERRITIN, TIBC, IRON, RETICCTPCT in the last 72 hours. Sepsis Labs: No results for input(s): PROCALCITON, LATICACIDVEN in the last 168 hours.  No results found for this or any previous visit (from the past 240 hour(s)).    Radiology Studies: DG Knee 1-2 Views Right  Result Date: 07/26/2019 CLINICAL DATA:  Right leg pain EXAM: RIGHT FEMUR 2 VIEWS; RIGHT KNEE - 1-2 VIEW COMPARISON:  None. FINDINGS: No acute fracture or malalignment of the right femur. Right hip joint space is relatively preserved. Mild tricompartmental degenerative changes of the right knee, most pronounced within the medial compartment. Ossification adjacent to the medial femoral condyle likely reflecting sequela of remote MCL injury. No knee joint effusion. Scattered vascular calcifications. Soft tissues are otherwise within normal limits. IMPRESSION: No acute osseous abnormality of the right femur or knee. Electronically Signed   By: Davina Poke D.O.   On: 07/26/2019 15:57   DG Abd 1 View  Result Date: 07/26/2019 CLINICAL DATA:  Abdominal swelling EXAM: ABDOMEN - 1 VIEW COMPARISON:  07/23/2019 FINDINGS: Enteric tubing courses below the diaphragm with distal tip terminating in the expected location of the gastric body. Nonobstructive bowel gas pattern. IMPRESSION: Nonobstructive bowel gas pattern. Electronically Signed   By: Davina Poke D.O.   On: 07/26/2019 15:55   DG FEMUR, MIN 2 VIEWS RIGHT  Result Date: 07/26/2019 CLINICAL DATA:  Right leg pain EXAM: RIGHT FEMUR 2 VIEWS; RIGHT KNEE - 1-2 VIEW COMPARISON:  None. FINDINGS: No acute  fracture or malalignment of the right femur. Right hip joint space is relatively preserved. Mild tricompartmental degenerative changes of the right knee, most pronounced within the medial compartment. Ossification adjacent to the medial femoral condyle likely reflecting sequela of remote MCL injury. No knee joint effusion. Scattered vascular calcifications. Soft tissues are otherwise within normal limits. IMPRESSION: No acute osseous abnormality of the right femur or knee. Electronically  Signed   By: Davina Poke D.O.   On: 07/26/2019 15:57     Scheduled Meds: . bacitracin  1 application Topical BID  . bethanechol  5 mg Per Tube TID  . chlorhexidine gluconate (MEDLINE KIT)  15 mL Mouth Rinse BID  . Chlorhexidine Gluconate Cloth  6 each Topical Daily  . cloNIDine  0.2 mg Per Tube TID  . doxycycline  100 mg Oral Q12H  . famotidine  20 mg Per Tube BID  . feeding supplement (PRO-STAT SUGAR FREE 64)  30 mL Per Tube BID  . folic acid  1 mg Per Tube Daily  . guaiFENesin  15 mL Per Tube Q6H  . heparin injection (subcutaneous)  5,000 Units Subcutaneous Q8H  . insulin aspart  0-15 Units Subcutaneous Q4H  . mouth rinse  15 mL Mouth Rinse 10 times per day  . methadone  5 mg Per Tube Q12H  . metoprolol tartrate  75 mg Per Tube BID  . multivitamin  15 mL Oral Daily  . nicotine  14 mg Transdermal Daily  . polyethylene glycol  17 g Per Tube Daily  . QUEtiapine  200 mg Per Tube BID  . sodium chloride flush  10-40 mL Intracatheter Q12H  . thiamine  100 mg Per Tube Daily   Continuous Infusions: . sodium chloride Stopped (07/17/19 2053)  . feeding supplement (OSMOLITE 1.2 CAL) 1,000 mL (07/27/19 0756)  . sodium chloride       LOS: 45 days    Time spent: 25 minutes spent in the coordination of care today.    Jonnie Finner, DO [facility]  If 7PM-7AM, please contact night-coverage [email or web address] 07/27/2019, 3:55 PM

## 2019-07-27 NOTE — Progress Notes (Signed)
  Speech Language Pathology Treatment: Dysphagia;Passy Muir Speaking valve  Patient Details Name: [name]. MRN: [id number] DOB: October 05, 1960 Today's Date: 07/27/2019 Time: [id number] SLP Time Calculation (min) (ACUTE ONLY): 25 min  Assessment / Plan / Recommendation Clinical Impression  Intervention with Passy-Muir speaking valve and dysphagia to facilitate use of upper airway for verbalization, cough and increased pressure for swallowing. Decreased secretions noted today and vocal quality clear however cues needed for increased intelligibility. When pt is conscious of speech techniques he can decrease rate and demonstrate distinct pauses between words with 100% intelligibility. Tolerated speaking valve with no signs of distress.  Introduced respiratory muscle strength training using handheld EMT trainer (expiratory muscle training). Pt able to achieve good labial seal around mouthpiece with verbal feedback, initiate deep breath and appropriately exhale at various settings (5, 9, 12 cm H2O). He completed intervals of 5 repetitions and 10.  At level 12 pt reported increased exertion and mild dizziness. Will continue treatment- he is making progress and will continue treatment.    HPI HPI: 59 year old man with a history of alcohol use disorder, tobacco use disorder, HTN, and recent admission on 04/10/2019 for pancreatitis and hyponatremia who presents for evaluation of shortness of breath and neck swelling with loss of airway and PEA arrest requiring emergent tracheostomy. Trach changed from 4 cuffless back to Shiley 6 cuffed on 1/30 after RR event, and transferred back to ICU.  Pt has been difficult to wean from sedation and intermittently agitated.      SLP Plan  Continue with current plan of care       Recommendations  Diet recommendations: NPO Medication Administration: Via alternative means      Patient may use Passy-Muir Speech Valve: During all therapies with supervision PMSV  Supervision: Full MD: Please consider changing trach tube to : Smaller size;Cuffless         Oral Care Recommendations: Oral care QID Follow up Recommendations: Skilled Nursing facility SLP Visit Diagnosis: Dysphagia, oropharyngeal phase (R13.12);Aphonia (R49.1) Plan: Continue with current plan of care       [name], [name] 07/27/2019, 12:22 PM

## 2019-07-27 NOTE — Progress Notes (Signed)
No changes to assessment

## 2019-07-28 ENCOUNTER — Inpatient Hospital Stay (HOSPITAL_COMMUNITY): Payer: Medicaid Other

## 2019-07-28 DIAGNOSIS — R1310 Dysphagia, unspecified: Secondary | ICD-10-CM

## 2019-07-28 DIAGNOSIS — J9611 Chronic respiratory failure with hypoxia: Secondary | ICD-10-CM

## 2019-07-28 LAB — GLUCOSE, CAPILLARY
Glucose-Capillary: 109 mg/dL — ABNORMAL HIGH (ref 70–99)
Glucose-Capillary: 121 mg/dL — ABNORMAL HIGH (ref 70–99)
Glucose-Capillary: 131 mg/dL — ABNORMAL HIGH (ref 70–99)
Glucose-Capillary: 131 mg/dL — ABNORMAL HIGH (ref 70–99)
Glucose-Capillary: 133 mg/dL — ABNORMAL HIGH (ref 70–99)
Glucose-Capillary: 140 mg/dL — ABNORMAL HIGH (ref 70–99)
Glucose-Capillary: 92 mg/dL (ref 70–99)

## 2019-07-28 MED ORDER — FUROSEMIDE 10 MG/ML IJ SOLN
40.0000 mg | Freq: Every day | INTRAMUSCULAR | Status: AC
  Administered 2019-07-28 – 2019-07-30 (×3): 40 mg via INTRAVENOUS
  Filled 2019-07-28 (×3): qty 4

## 2019-07-28 MED ORDER — LOSARTAN POTASSIUM 25 MG PO TABS
25.0000 mg | ORAL_TABLET | Freq: Every day | ORAL | Status: DC
  Administered 2019-07-31 – 2019-08-04 (×5): 25 mg via ORAL
  Filled 2019-07-28 (×5): qty 1

## 2019-07-28 NOTE — Progress Notes (Signed)
Physical Therapy Treatment Patient Details Name: [name]. MRN: [id number] DOB: February 21, 1961 Today's Date: 07/28/2019    History of Present Illness Pt is a 59 y.o. male admitted 06/12/19 for SOB and neck swelling. Admitted to ICU with loss of airway and PEA arrest requiring emergent trach; has been difficult to wean off sedation. 1/15 R foot MRI suggestive of cellulitis, s/p aspiration. 1/30 return to ICU for hypercarbic respiratory failure and encephalopathy. 2/06 on trach collar 72 hrs and vent liberated; 2/09 orders to leave ICU for progressive care  PMH includes recent admission for pancreatitis (04/10/19), ETOH and tobacco use, HTN.    PT Comments    Pt demonstrating improved transfers, but still requires assist of 2 for safety with line/leads as pt is impulsive.  He was able to transfer to bsc and take a few steps with RW and min A of 2 but declined further mobility despite education/encouragement.  All VSS on 28% FiO2 trach collar with PMSV.  Cont POC.    Follow Up Recommendations  SNF;Supervision/Assistance - 24 hour     Equipment Recommendations  Other (comment)(deferred to next venue)    Recommendations for Other Services       Precautions / Restrictions Precautions Precautions: Fall Precaution Comments: trach collar, cortrak feeding tube;  bilat mittens; posey wrist restraints    Mobility  Bed Mobility Overal bed mobility: Needs Assistance Bed Mobility: Supine to Sit;Sit to Supine   Sidelying to sit: HOB elevated;Min guard;+2 for safety/equipment   Sit to supine: HOB elevated;Min guard;+2 for safety/equipment   General bed mobility comments: +2 needed for line management and safety  Transfers Overall transfer level: Needs assistance Equipment used: Rolling walker (2 wheeled) Transfers: Sit to/from [facility] Sit to Stand: Min assist;+2 safety/equipment Stand pivot transfers: Min assist;+2 safety/equipment       General transfer comment:  Had min A of 2 for line/lead safety and pt impulsive.  Pt performed sit to stand x 2 with RW; stand pivot to bsc and back to bed with RW  Ambulation/Gait Ambulation/Gait assistance: Min assist Gait Distance (Feet): 1 Feet(x2) Assistive device: Rolling walker (2 wheeled) Gait Pattern/deviations: Decreased stride length;Shuffle     General Gait Details: small steps to bsc and back; tried to encourage further ambulation but pt lying back down and refusing   Stairs             Wheelchair Mobility    Modified Rankin (Stroke Patients Only)       Balance Overall balance assessment: Needs assistance Sitting-balance support: No upper extremity supported;Feet supported Sitting balance-Leahy Scale: Fair     Standing balance support: Bilateral upper extremity supported Standing balance-Leahy Scale: Poor                              Cognition Arousal/Alertness: Awake/alert Behavior During Therapy: Impulsive;Restless Overall Cognitive Status: Difficult to assess Area of Impairment: Safety/judgement;Following commands;Awareness;Problem solving;Orientation                 Orientation Level: Disoriented to;Time;Situation   Memory: Decreased short-term memory;Decreased recall of precautions Following Commands: Follows one step commands consistently;Follows multi-step commands consistently Safety/Judgement: Decreased awareness of safety;Decreased awareness of deficits   Problem Solving: Requires verbal cues;Requires tactile cues General Comments: Pt impulsive and requiring constant supervision to keep him from pulling on lines/leads.  Pt pulled out cortrak feeding tube earlier today.      Exercises      General  Comments General comments (skin integrity, edema, etc.): on trach collar 28% FiO2, cuff deflated and PMSV on with SPO2 93% or greater      Pertinent Vitals/Pain Pain Assessment: No/denies pain    Home Living                      Prior  Function            PT Goals (current goals can now be found in the care plan section) Progress towards PT goals: Progressing toward goals    Frequency    Min 2X/week      PT Plan Current plan remains appropriate    Co-evaluation              AM-PAC PT "6 Clicks" Mobility   Outcome Measure  Help needed turning from your back to your side while in a flat bed without using bedrails?: None Help needed moving from lying on your back to sitting on the side of a flat bed without using bedrails?: None Help needed moving to and from a bed to a chair (including a wheelchair)?: A Little Help needed standing up from a chair using your arms (e.g., wheelchair or bedside chair)?: A Little Help needed to walk in hospital room?: A Lot Help needed climbing 3-5 steps with a railing? : A Lot 6 Click Score: 18    End of Session Equipment Utilized During Treatment: Oxygen Activity Tolerance: Patient tolerated treatment well Patient left: in bed;with call bell/phone within reach;with bed alarm set(bil wrist restraints in place) Nurse Communication: Mobility status(pt had pulled off condom cath) PT Visit Diagnosis: Muscle weakness (generalized) (M62.81);Difficulty in walking, not elsewhere classified (R26.2)     Time: [id number] PT Time Calculation (min) (ACUTE ONLY): 24 min  Charges:  $Therapeutic Activity: 23-37 mins                     Maggie Font, PT Acute Rehab Services Pager [phone] [facility] [phone] [facility] [phone]    Karlton Lemon 07/28/2019, 4:49 PM

## 2019-07-28 NOTE — Progress Notes (Signed)
PULMONARY / CRITICAL CARE MEDICINE  NAME:  [name]., MRN:  [id number], DOB:  11/16/1960, LOS: 39 ADMISSION DATE:  06/12/2019, CONSULTATION DATE:  06/12/2019 REFERRING MD:  Alinda Sierras, CHIEF COMPLAINT:  Shortness of breath, sore throat, and neck swelling  BRIEF HISTORY:    59 yo male smoker with hx of ETOH had admission in November 2020 for pancreatitis and hyponatremia presented with dyspnea and neck swelling from cellulitis with acute airway compromise resulting in PEA requiring emergent tracheostomy.  Found to have H influenza bacteremia.  SIGNIFICANT PAST MEDICAL HISTORY   ETOH, Pneumonia, COPD, Rt lung empyema, Hep C, Pancreatitis  SIGNIFICANT EVENTS:  1/04 Lost airway and PEA arrest on, requiring emergent tracheostomy and ROSC obtained 1/05 Blood cultures 4/4 growing H.Influenza , narrowed to Unasyn 1/5 - 1/14 Continued on antibiotics without any significant improvement  1/14 New R foot swelling for which MRI obtained due to concerns for seeding cellulitis from neck 1/15 MRI with synovitis and subcutaneous edema suggestive of cellulitis; orthopedics consulted for right ankle aspiration - aspirate with positive crystals suggestive of gout - patient started on NSAIDs and steroids 1/16 Started on dilaudid, D/C fentanyl  1/17 Patient agitated early AM, q2h prn versed prescribed 1/18 Hb 5.2, transfused 2u pRBC; started methadone  1/19 Klonopin increased to tid and metoprolol added for SVTs 1/20 precedex gtt discontinued overnight for concerns of bradycardia  1/21 clonidine taper, 24hr trach collar 1/22 continuing trach collar 1/23 last day of unasyn for cellulitis, steroid taper and colchicine for gout, continuing trach collar  1/24 72 hours of trach collar 1/25 changed to 4 cuffless 1/26 sedation cut back 1/30 returned to ICU for hypercarbic respiratory failure and encephalopathy 2/2 inadvertent high dose of methadone and resultant resp faillure. Trach positioned in Cric so  underwent re-do 2/06 liberated from vent. Has been TC for 72 hours. 2/09 off precedex, transfer to progressive care 2/11 failed MBS 2/12 Required tracheal suctioning, decided to hold off on tracheostomy change over the weekend  CULTURES:  Influenza A / B 1/4 >> Negative Sars Coronavirus 1/4 >> Negative Blood cultures 1/4 >> 4/4 Positive for H. Influenza  Urine culture 1/4 >> no growth BCx2 1/6 >> negative  Tracheal aspirate 1/13 >> few klebsiella pneumoniae  R ankle aspirate 1/16 >> monosodium urate crystals, no organisms on gram stain  Sputum culture 1/27 >> klebsiella >> S-cefazolin, cefepime  ANTIBIOTICS:  Bactrim 1/4 Azithromycin 1/4  Vancomycin 1/4 Zosyn  1/4 one dose Clindamycin 1/4 one dose Unasyn 1/5 >> 1/23  Ceftriaxone 1/29 (for klebsiella) >>   LINES/TUBES:  Tracheostomy 1/4 >> 1/30 (redo), 1/30 >> PICC line double lumen - placed 1/15  CONSULTANTS:  Infectious Disease  ENT  Orthopedic surgery  SUBJECTIVE:  No acute overnight events. Notified by primary team that patient is still having secretions. Resting comfortably this morning in no distress. No sitter.   OBJECTIVE:       CONSTITUTIONAL: BP (!) 165/76 (BP Location: Right Leg)   Pulse 72   Temp 98.5 F (36.9 C) (Oral)   Resp 20   Ht 5\' 8"  (1.727 m)   Wt 70.2 kg   SpO2 98%   BMI 23.54 kg/m   I/O last 3 completed shifts: In: 2438.2 [NG/GT:2438.2] Out: -    FiO2 (%):  [21 %-28 %] 21 %   PHYSICAL EXAM: General: resting comfortably, no distress HEENT: MM pink/dry, trach midline c/d/i CV: s1s2 RRR, no m/r/g PULM:  Non-labored, lungs bilaterally clear, 28% trach collar. 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 influenzae bacteremiaProlonged hospital course with respiratory failure.  Requiring tracheostomy tube.  Tracheostomy dependence Chronic respiratory failure Dysphagia  Plan Continue PMV usage with SLP I will diurese him with IV lasix for the next 3 days to help with secretion management. He is net positive for admission. Holding at this time on additional trach exchange. We will consider doing this early next week if his secretions are improved after lasix.  Would avoid any anti-cholinergics for thickening secretions as this will make them more difficult to suction and manage.  S/p 5 day course of doxycycline for tracheitis.  Continue PT OT Continue aspiration precautions We appreciate SLP input and evaluation. Pulmonary will continue to follow for trach care. Will plan to see Monday 2/22. Please call if needed over the weekend.   Lenice Llamas, MD Pulmonary and [place] Pager: [place]

## 2019-07-28 NOTE — Progress Notes (Signed)
[name]  PROGRESS NOTE    [name].  FXT:[id number] DOB: [birth year] DOA: 06/12/2019 PCP: Alvester Chou, NP   Brief Narrative:   59 y.o.malewith a history of alcohol/tobacco abuse, COPD, hyponatremia,empyema in June 2020 and admission for acute pancreatitis in November 2992 complicated by respiratory failure requiring tracheostomy at that time presented on 06/12/2019 with neck swelling found to have cellulitis with acute airway compromise resulting in PEA arrest requiring emergent tracheostomy.Neck CTA-thickening epiglottis with pharyngeal and supraglottic wall edema,also multifocal pneumonia by CT-recieved empiric antibiotics, racemic epinephrine with Decadron per ENT recommendation. Blood cultures grew H.Influenza 4/4 , abx changed to Unasyn 1/5 >1/14 without any significant improvement. New right foot swelling noted on 1/14- MRI obtained to rule out infectious emboli-it showed synovitis and subcutaneous edema suggestive of cellulitis; orthopedics consulted for right ankle aspiration - aspirate with positive crystals suggestive ofgout- patient started on NSAIDs and steroids. Pain meds titrated ( Started on dilaudid, methadone,D/Cedfentanyl). Patient completed Unasyn course on 1/23.  HC complicated by delirium/agitation requiring versed/klonapin/precedex gtt while in ICU, anemiarequiring transfusion, elevated LFTs,and paroxysmal SVTs.On 1/17: Patient agitated early AM, q2h prn versed prescribed. Hb 5.2, transfused 2u pRBC; started methadone . On 1/19: Klonopin increased to tid and metoprolol added for SVTs but patient had subsequent bradycardia, clonidine tapered,precedex gtt discontinued . 1/21-1/24--placed on trach collar, then changed to 4 cuffless and sedation cut back slowly. However, on 1/30 patient returned to ICU for hypercarbic respiratory failure and encephalopathy--finally liberated from ventilator on 2/06- takenoff precedex and transferred to progressive care/ [facility] on 2/09. PCCM  continuingtracheostomy care, remains on trach collar. 2/11 Failed swallow study, PICC line removed 2/12. Clonazepam/methadone dosing being tapered down.Per wife,patient has history of dysphagia, suspected to have esophageal stenosis and was supposed to follow-up with GI for "stretching" as outpatient. Losartan/diltiazem discontinued2/14and patient given 250 normal saline bolus for hypotension.Seen by PCCM, ID, ENT and ortho during [facility]  07/28/19: Using PMV. Says he's feeling more positive this AM. Pulm to diurese over next few days. Ok to resume low dose losartan. Pulled coretrak again. Replace.    Assessment & Plan:   Principal Problem:   Neck infection Active Problems:   Alcohol abuse   Poor dentition   Septic shock (HCC)   Laryngeal edema   Multifocal pneumonia   Airway compromise   Chronic hepatitis C without hepatic coma (HCC)   Acute respiratory failure with hypoxemia (HCC)   Cardiac arrest, cause unspecified (HCC)   Haemophilis influenzae bacteremia    Malnutrition of moderate degree   Atelectasis   Tracheostomy care ([place])   Acute metabolic encephalopathy   Hypercarbia   Tracheostomy status (HCC)   Pressure injury of skin  Acute hypoxic/hypercarbic respiratory failure, present on admission Neck cellulitus/swelling, tracheitis Multifocal PNA H. influenza bacteremia - now tracheostomy dependent - Patient presented with neck swelling/infection with tracheitis and airway compromise. - He received Decadron/racemic epinephrine per ENT recommendations and was intubated.  - Ended up with tracheostomy for failure to liberate from the vent. - Completed IV unasyn on 1/23  - PCCM onboard, appreciate assistance, continues to have excess secretions, defer to PCCM. - continue doxycycline     - 07/27/19: continue doxy through tomorrow; wean O2, trach as able     - 07/28/19: respirations improving, getting IV lasix for secretions, wean trach as able,  doxy stops today.  Acute metabolic encephalopathy - In the setting of above.  - He also has history of alcohol abuse/opiate dependence.  - Continue supportive care.  - Delirium  precautions. - Methadone 5 mg BID; Methadone being tapered down while titrating clonidine. - return seroquel dose to 268m BID d/t agitation ON  - also has Seroquel/Haldol additional dosing available as needed for agitation.     - 07/27/19: still intermittent agitation; monitor     - 07/28/19: mentation better this morning, says cortrak is bothering his throat  Dysphagia, history of esophageal stenosis  - Speech therapy following along - continue NPO for now     - 07/28/19: he pulled cortrak, replace until cleared by SLP  Hypertension with hypotensive episode on 2/14 - Received IV fluid bolus. - losartan, diltiazem discontinued - remains on metoprolol and clonidine. - BP up and down today; can consider low dose losartan in AM if BP continues this trend     - 07/27/19: BP is ok today     - 07/28/19: ok to resume losartan; also being diuresed with pulm, watch renal function  COPD/tobacco use - Continue nicotine patch.   - albuterol as needed.  PSVT - Remains on beta-blockers.  - Off diltiazem.  - Patient did have PEA cardiac arrest on presentation requiring emergent tracheostomy.  Alcohol abuse - Out of window for alcohol withdrawal.  - Continue multivitamin/thiamine.  Moderate protein calorie malnutrition with hypoalbuminemia - Due to prolonged hospitalization and n.p.o. status/confusion.  - Albumin level 2.9-3.1.  - On tube feeds currently. - SLP onboard  DVT prophylaxis: heparin Code Status: FULL Family Communication: None at bedside   Disposition Plan: Needs placement at SNF or LTACH  Consultants:   PCCM  Procedures:   Intubation  Trach  Antimicrobials:  . Doxy   ROS:    Denies CP, N, V, ab pain. Report throat irritation with cortrake . Remainder 10-pt ROS is negative for all not previously mentioned.  Subjective: "It's good to talk."  Objective: Vitals:   07/28/19 0230 07/28/19 0435 07/28/19 0700 07/28/19 1043  BP:  (!) 165/76  (!) 189/63  Pulse:  72 72 75  Resp:  20 20 19   Temp:  98.5 F (36.9 C)  98.1 F (36.7 C)  TempSrc:  Oral  Oral  SpO2: 97% 97% 98%   Weight:      Height:        Intake/Output Summary (Last 24 hours) at 07/28/2019 1139 Last data filed at 07/28/2019 0400 Gross per 24 hour  Intake 2438.17 ml  Output --  Net 2438.17 ml   Filed Weights   07/14/19 0500 07/18/19 0427 07/21/19 0342  Weight: 69.7 kg 70.5 kg 70.2 kg    Examination:  General: 59y.o. male resting in bed in NAD Cardiovascular: RRR, +S1, S2, no m/g/r, equal pulses throughout Respiratory: secretions improved, some upper rhonchi still noted but improved, normal WOB GI: BS+, NDNT, no masses noted, soft MSK: No e/c/c Neuro: Alert to name, follows commands Psyc: Appropriate interaction and affect, calm/cooperative   Data Reviewed: I have personally reviewed following labs and imaging studies.  CBC: Recent Labs  Lab 07/23/19 0225 07/25/19 0141 07/26/19 0950 07/27/19 0228  WBC 8.3 8.2 9.3 8.6  NEUTROABS  --   --  6.7 5.3  HGB 11.5* 11.3* 11.8* 11.0*  HCT 39.6 37.6* 38.9* 36.1*  MCV 87.0 84.7 85.3 85.1  PLT 314 369 427* 4037   Basic Metabolic Panel: Recent Labs  Lab 07/23/19 0225 07/25/19 0141 07/26/19 0950 07/27/19 0228  NA 137 136 139 139  K 4.5 4.2 4.5 4.2  CL 99 100 99 102  CO2 23 25 25 26   GLUCOSE  117* 94 119* 121*  BUN 22* 23* 25* 28*  CREATININE 0.60* 0.58* 0.61 0.82  CALCIUM 10.0 10.1 10.2 9.9  MG  --   --   --  1.9  PHOS  --   --  5.0*  --    GFR: Estimated Creatinine Clearance: 96.2 mL/min (by C-G formula based on SCr of 0.82 mg/dL). Liver Function Tests: Recent Labs  Lab 07/23/19 0225 07/26/19 0950 07/27/19 0228  AST  82*  --  41  ALT 177*  --  99*  ALKPHOS 350*  --  199*  BILITOT 0.5  --  0.7  PROT 7.4  --  7.5  ALBUMIN 3.1* 3.2* 3.0*   No results for input(s): LIPASE, AMYLASE in the last 168 hours. No results for input(s): AMMONIA in the last 168 hours. Coagulation Profile: No results for input(s): INR, PROTIME in the last 168 hours. Cardiac Enzymes: No results for input(s): CKTOTAL, CKMB, CKMBINDEX, TROPONINI in the last 168 hours. BNP (last 3 results) No results for input(s): PROBNP in the last 8760 hours. HbA1C: No results for input(s): HGBA1C in the last 72 hours. CBG: Recent Labs  Lab 07/27/19 1620 07/27/19 2003 07/28/19 0016 07/28/19 0404 07/28/19 0712  GLUCAP 117* 98 133* 92 140*   Lipid Profile: No results for input(s): CHOL, HDL, LDLCALC, TRIG, CHOLHDL, LDLDIRECT in the last 72 hours. Thyroid Function Tests: No results for input(s): TSH, T4TOTAL, FREET4, T3FREE, THYROIDAB in the last 72 hours. Anemia Panel: No results for input(s): VITAMINB12, FOLATE, FERRITIN, TIBC, IRON, RETICCTPCT in the last 72 hours. Sepsis Labs: No results for input(s): PROCALCITON, LATICACIDVEN in the last 168 hours.  No results found for this or any previous visit (from the past 240 hour(s)).    Radiology Studies: DG Knee 1-2 Views Right  Result Date: 07/26/2019 CLINICAL DATA:  Right leg pain EXAM: RIGHT FEMUR 2 VIEWS; RIGHT KNEE - 1-2 VIEW COMPARISON:  None. FINDINGS: No acute fracture or malalignment of the right femur. Right hip joint space is relatively preserved. Mild tricompartmental degenerative changes of the right knee, most pronounced within the medial compartment. Ossification adjacent to the medial femoral condyle likely reflecting sequela of remote MCL injury. No knee joint effusion. Scattered vascular calcifications. Soft tissues are otherwise within normal limits. IMPRESSION: No acute osseous abnormality of the right femur or knee. Electronically Signed   By: Davina Poke D.O.   On:  07/26/2019 15:57   DG Abd 1 View  Result Date: 07/26/2019 CLINICAL DATA:  Abdominal swelling EXAM: ABDOMEN - 1 VIEW COMPARISON:  07/23/2019 FINDINGS: Enteric tubing courses below the diaphragm with distal tip terminating in the expected location of the gastric body. Nonobstructive bowel gas pattern. IMPRESSION: Nonobstructive bowel gas pattern. Electronically Signed   By: Davina Poke D.O.   On: 07/26/2019 15:55   DG CHEST PORT 1 VIEW  Result Date: 07/28/2019 CLINICAL DATA:  Acute respiratory failure with hypoxia. EXAM: PORTABLE CHEST 1 VIEW COMPARISON:  Radiograph 07/24/2019. CT 06/19/2019 FINDINGS: Tracheostomy tube tip at the thoracic inlet. Weighted enteric tube in place with tip below the diaphragm not included in the field of view. Unchanged cardiomegaly with aortic atherosclerosis. Interstitial opacities are unchanged from prior. Retrocardiac opacity appears similar. No new airspace disease. No pneumothorax. IMPRESSION: 1. Unchanged cardiomegaly and interstitial opacities, favoring pulmonary edema. Retrocardiac opacity may be atelectasis or pneumonia, unchanged. 2. Stable support apparatus. Electronically Signed   By: Keith Rake M.D.   On: 07/28/2019 06:38   DG FEMUR, MIN 2 VIEWS RIGHT  Result Date: 07/26/2019 CLINICAL DATA:  Right leg pain EXAM: RIGHT FEMUR 2 VIEWS; RIGHT KNEE - 1-2 VIEW COMPARISON:  None. FINDINGS: No acute fracture or malalignment of the right femur. Right hip joint space is relatively preserved. Mild tricompartmental degenerative changes of the right knee, most pronounced within the medial compartment. Ossification adjacent to the medial femoral condyle likely reflecting sequela of remote MCL injury. No knee joint effusion. Scattered vascular calcifications. Soft tissues are otherwise within normal limits. IMPRESSION: No acute osseous abnormality of the right femur or knee. Electronically Signed   By: Davina Poke D.O.   On: 07/26/2019 15:57     Scheduled  Meds: . bacitracin  1 application Topical BID  . bethanechol  5 mg Per Tube TID  . chlorhexidine gluconate (MEDLINE KIT)  15 mL Mouth Rinse BID  . Chlorhexidine Gluconate Cloth  6 each Topical Daily  . cloNIDine  0.2 mg Per Tube TID  . doxycycline  100 mg Oral Q12H  . famotidine  20 mg Per Tube BID  . feeding supplement (PRO-STAT SUGAR FREE 64)  30 mL Per Tube BID  . folic acid  1 mg Per Tube Daily  . furosemide  40 mg Intravenous Daily  . guaiFENesin  15 mL Per Tube Q6H  . heparin injection (subcutaneous)  5,000 Units Subcutaneous Q8H  . insulin aspart  0-15 Units Subcutaneous Q4H  . losartan  25 mg Oral Daily  . mouth rinse  15 mL Mouth Rinse 10 times per day  . methadone  5 mg Per Tube Q12H  . metoprolol tartrate  75 mg Per Tube BID  . multivitamin  15 mL Oral Daily  . nicotine  14 mg Transdermal Daily  . polyethylene glycol  17 g Per Tube Daily  . QUEtiapine  200 mg Per Tube BID  . sodium chloride flush  10-40 mL Intracatheter Q12H  . thiamine  100 mg Per Tube Daily   Continuous Infusions: . sodium chloride Stopped (07/17/19 2053)  . feeding supplement (OSMOLITE 1.2 CAL) 1,000 mL (07/27/19 2251)  . sodium chloride       LOS: 46 days    Time spent: 25 minutes spent in the coordination of care today.    Jonnie Finner, DO [facility]  If 7PM-7AM, please contact night-coverage [email or web address] 07/28/2019, 11:39 AM

## 2019-07-29 ENCOUNTER — Inpatient Hospital Stay (HOSPITAL_COMMUNITY): Payer: Medicaid Other

## 2019-07-29 LAB — COMPREHENSIVE METABOLIC PANEL
ALT: 83 U/L — ABNORMAL HIGH (ref 0–44)
AST: 36 U/L (ref 15–41)
Albumin: 3.3 g/dL — ABNORMAL LOW (ref 3.5–5.0)
Alkaline Phosphatase: 195 U/L — ABNORMAL HIGH (ref 38–126)
Anion gap: 13 (ref 5–15)
BUN: 21 mg/dL — ABNORMAL HIGH (ref 6–20)
CO2: 24 mmol/L (ref 22–32)
Calcium: 10 mg/dL (ref 8.9–10.3)
Chloride: 101 mmol/L (ref 98–111)
Creatinine, Ser: 0.83 mg/dL (ref 0.61–1.24)
GFR calc Af Amer: >60 mL/min (ref >60)
GFR calc non Af Amer: >60 mL/min (ref >60)
Glucose, Bld: 126 mg/dL — ABNORMAL HIGH (ref 70–99)
Potassium: 4.5 mmol/L (ref 3.5–5.1)
Sodium: 138 mmol/L (ref 135–145)
Total Bilirubin: 1.2 mg/dL (ref 0.3–1.2)
Total Protein: 7.9 g/dL (ref 6.5–8.1)

## 2019-07-29 LAB — CBC WITH DIFFERENTIAL/PLATELET
Abs Immature Granulocytes: 0 10*3/uL (ref 0.00–0.07)
Basophils Absolute: 0 10*3/uL (ref 0.0–0.1)
Basophils Relative: 0 %
Eosinophils Absolute: 0 10*3/uL (ref 0.0–0.5)
Eosinophils Relative: 0 %
HCT: 39.8 % (ref 39.0–52.0)
Hemoglobin: 12.7 g/dL — ABNORMAL LOW (ref 13.0–17.0)
Lymphocytes Relative: 5 %
Lymphs Abs: 1.4 10*3/uL (ref 0.7–4.0)
MCH: 26.1 pg (ref 26.0–34.0)
MCHC: 31.9 g/dL (ref 30.0–36.0)
MCV: 81.7 fL (ref 80.0–100.0)
Monocytes Absolute: 0 10*3/uL — ABNORMAL LOW (ref 0.1–1.0)
Monocytes Relative: 0 %
Neutro Abs: 25.7 10*3/uL — ABNORMAL HIGH (ref 1.7–7.7)
Neutrophils Relative %: 95 %
Platelets: 450 10*3/uL — ABNORMAL HIGH (ref 150–400)
RBC: 4.87 MIL/uL (ref 4.22–5.81)
RDW: 19 % — ABNORMAL HIGH (ref 11.5–15.5)
WBC: 27 10*3/uL — ABNORMAL HIGH (ref 4.0–10.5)
nRBC: 0 % (ref 0.0–0.2)
nRBC: 0 /100 WBC

## 2019-07-29 LAB — GLUCOSE, CAPILLARY
Glucose-Capillary: 114 mg/dL — ABNORMAL HIGH (ref 70–99)
Glucose-Capillary: 106 mg/dL — ABNORMAL HIGH (ref 70–99)
Glucose-Capillary: 101 mg/dL — ABNORMAL HIGH (ref 70–99)
Glucose-Capillary: 106 mg/dL — ABNORMAL HIGH (ref 70–99)

## 2019-07-29 LAB — MAGNESIUM: Magnesium: 1.8 mg/dL (ref 1.7–2.4)

## 2019-07-29 MED ORDER — METOPROLOL TARTRATE 5 MG/5ML IV SOLN: 5.0000 mg | Freq: Four times a day (QID) | INTRAVENOUS | Status: DC | PRN

## 2019-07-29 MED ORDER — CLONIDINE HCL 0.2 MG/24HR TD PTWK
0.2000 mg | MEDICATED_PATCH | TRANSDERMAL | Status: DC
  Administered 2019-07-29: 0.2 mg via TRANSDERMAL
  Filled 2019-07-29: qty 1

## 2019-07-29 MED ORDER — PANTOPRAZOLE SODIUM 40 MG IV SOLR
40.0000 mg | INTRAVENOUS | Status: DC
  Administered 2019-07-29 – 2019-07-31 (×3): 40 mg via INTRAVENOUS
  Filled 2019-07-29 (×3): qty 40

## 2019-07-29 MED ORDER — HALOPERIDOL LACTATE 5 MG/ML IJ SOLN: 5.0000 mg | Freq: Four times a day (QID) | INTRAMUSCULAR | Status: DC | PRN

## 2019-07-29 MED ORDER — METOPROLOL TARTRATE 5 MG/5ML IV SOLN
5.0000 mg | Freq: Four times a day (QID) | INTRAVENOUS | Status: DC
  Administered 2019-07-29 – 2019-07-30 (×3): 5 mg via INTRAVENOUS
  Filled 2019-07-29 (×2): qty 5

## 2019-07-29 NOTE — Progress Notes (Signed)
[name]  PROGRESS NOTE    [name].  IFO:[id number] DOB: Nov 19, 1960 DOA: 06/12/2019 PCP: Alvester Chou, NP   Brief Narrative:   59 y.o.malewith a history of alcohol/tobacco abuse, COPD, hyponatremia,empyema in June 2020 and admission for acute pancreatitis in November 6767 complicated by respiratory failure requiring tracheostomy at that time presented on 06/12/2019 with neck swelling found to have cellulitis with acute airway compromise resulting in PEA arrest requiring emergent tracheostomy.Neck CTA-thickening epiglottis with pharyngeal and supraglottic wall edema,also multifocal pneumonia by CT-recieved empiric antibiotics, racemic epinephrine with Decadron per ENT recommendation. Blood cultures grew H.Influenza 4/4 , abx changed to Unasyn 1/5 >1/14 without any significant improvement. New right foot swelling noted on 1/14- MRI obtained to rule out infectious emboli-it showed synovitis and subcutaneous edema suggestive of cellulitis; orthopedics consulted for right ankle aspiration - aspirate with positive crystals suggestive ofgout- patient started on NSAIDs and steroids. Pain meds titrated ( Started on dilaudid, methadone,D/Cedfentanyl). Patient completed Unasyn course on 1/23.  HC complicated by delirium/agitation requiring versed/klonapin/precedex gtt while in ICU, anemiarequiring transfusion, elevated LFTs,and paroxysmal SVTs.On 1/17: Patient agitated early AM, q2h prn versed prescribed. Hb 5.2, transfused 2u pRBC; started methadone . On 1/19: Klonopin increased to tid and metoprolol added for SVTs but patient had subsequent bradycardia, clonidine tapered,precedex gtt discontinued . 1/21-1/24--placed on trach collar, then changed to 4 cuffless and sedation cut back slowly. However, on 1/30 patient returned to ICU for hypercarbic respiratory failure and encephalopathy--finally liberated from ventilator on 2/06- takenoff precedex and transferred to progressive care/ [facility] on 2/09. PCCM  continuingtracheostomy care, remains on trach collar. 2/11 Failed swallow study, PICC line removed 2/12. Clonazepam/methadone dosing being tapered down.Per wife,patient has history of dysphagia, suspected to have esophageal stenosis and was supposed to follow-up with GI for "stretching" as outpatient. Losartan/diltiazem discontinued2/14and patient given 250 normal saline bolus for hypotension.Seen by PCCM, ID, ENT and ortho during [facility].  07/29/19:He was doing fine with PMV earlier. Secretions need some suctioning this AM, but he is ok otherwise. Still does not have cortrak in. Have asked for NGT or re-eval by SLP for swallowing. Transition clonidine to patch. Schedule IV metoprolol.    Assessment & Plan:   Principal Problem:   Neck infection Active Problems:   Alcohol abuse   Poor dentition   Septic shock (HCC)   Laryngeal edema   Multifocal pneumonia   Airway compromise   Chronic hepatitis C without hepatic coma (HCC)   Acute respiratory failure with hypoxemia (HCC)   Cardiac arrest, cause unspecified (HCC)   Haemophilis influenzae bacteremia    Malnutrition of moderate degree   Atelectasis   Tracheostomy care ([place])   Acute metabolic encephalopathy   Hypercarbia   Tracheostomy status (HCC)   Pressure injury of skin  Acute hypoxic/hypercarbic respiratory failure, present on admission Neck cellulitus/swelling, tracheitis Multifocal PNA H. influenza bacteremia - now tracheostomy dependent - Patient presented with neck swelling/infection with tracheitis and airway compromise. - He received Decadron/racemic epinephrine per ENT recommendations and was intubated.  - Ended up with tracheostomy for failure to liberate from the vent. - Completed IV unasyn on 1/23  - PCCM onboard, appreciate assistance, continues to have excess secretions, defer to PCCM. - continue doxycycline - 07/27/19: continue doxy through tomorrow; wean O2, trach as able     -  07/28/19: respirations improving, getting IV lasix for secretions, wean trach as able, doxy stops today.     - 07/29/19: White count is way up. He's afebrile, and clinically appears the same.  Don't think we need to return to abx just yet. Continue suction and lasix for now as respiratory stat is actually ok. Follow white count for now  Acute metabolic encephalopathy - In the setting of above.  - He also has history of alcohol abuse/opiate dependence.  - Continue supportive care.  - Delirium precautions. - Methadone 5 mg BID; Methadone being tapered down while titrating clonidine. - return seroquel dose to 235m BID d/t agitation ON  - also has Seroquel/Haldol additional dosing available as needed for agitation. - 07/27/19: still intermittent agitation; monitor     - 07/28/19: mentation better this morning, says cortrak is bothering his throat  Dysphagia, history of esophageal stenosis  - Speech therapy following along - continue NPO for now     - 07/28/19: he pulled cortrak, replace until cleared by SLP     - 07/29/19: cortrak still not replaced; switching some of his meds to IV or patch; can place NGT or let's see if SLP with re-eval since he's been doing ok with PMV  Hypertension with hypotensive episode on 2/14 - Received IV fluid bolus. - losartan, diltiazem discontinued - remains on metoprolol and clonidine. - BP up and down today; can consider low dose losartan in AM if BP continues this trend - 07/27/19: BP is ok today     - 07/28/19: ok to resume losartan; also being diuresed with pulm, watch renal function     - 07/29/19: cortrak is out; he's getting lasix IV, will switch clonidine to patch, will schedule IV metoprolol  COPD/tobacco use - Continue nicotine patch.   - albuterol as needed.  PSVT - Remains on beta-blockers.  - Off diltiazem.  - Patient did have PEA cardiac arrest on presentation  requiring emergent tracheostomy.  Alcohol abuse - Out of window for alcohol withdrawal.  - Continue multivitamin/thiamine.  Moderate protein calorie malnutrition with hypoalbuminemia - Due to prolonged hospitalization and n.p.o. status/confusion.  - Albumin level 2.9-3.1.  - On tube feeds currently. - SLP onboard     - 07/29/19: pulled cortrak, have talked with SLP about schedule for swallow eval, they will review today; otherwise, he will need to have NGT placed  DVT prophylaxis: heparin Code Status: FULL Family Communication: None at bedside   Disposition Plan: Will need SNF or LTACH  Consultants:   PCCM  Procedures:   Trach  ROS:  Reports throat irritation . Remainder 10-pt ROS is negative for all not previously mentioned.  Subjective: Pointing at throat and mouths "sore".  Objective: Vitals:   07/29/19 1109 07/29/19 1154 07/29/19 1218 07/29/19 1301  BP:   (!) 163/95   Pulse: (!) 123 (!) 119 (!) 118 99  Resp: (!) 22 16 (!) 26 20  Temp:      TempSrc:      SpO2: 99% 96% 97%   Weight:      Height:        Intake/Output Summary (Last 24 hours) at 07/29/2019 1306 Last data filed at 07/29/2019 [id number] per 24 hour  Intake 10 ml  Output 250 ml  Net -240 ml   Filed Weights   07/14/19 0500 07/18/19 0427 07/21/19 0342  Weight: 69.7 kg 70.5 kg 70.2 kg    Examination:  General: 59y.o. male resting in bed in NAD Cardiovascular: tachy, +S1, S2, no m/g/r Respiratory: secretions noted, WOB about the same GI: BS+, NDNT, no masses noted, no organomegaly noted MSK: No e/c/c Neuro: Alert and following commands Psyc:  calm/cooperative   Data  Reviewed: I have personally reviewed following labs and imaging studies.  CBC: Recent Labs  Lab 07/23/19 0225 07/25/19 0141 07/26/19 0950 07/27/19 0228 07/29/19 0254  WBC 8.3 8.2 9.3 8.6 27.0*  NEUTROABS  --   --  6.7 5.3 25.7*  HGB 11.5* 11.3* 11.8* 11.0* 12.7*  HCT 39.6 37.6* 38.9* 36.1*  39.8  MCV 87.0 84.7 85.3 85.1 81.7  PLT 314 369 427* 413* 078*   Basic Metabolic Panel: Recent Labs  Lab 07/23/19 0225 07/25/19 0141 07/26/19 0950 07/27/19 0228 07/29/19 0254  NA 137 136 139 139 138  K 4.5 4.2 4.5 4.2 4.5  CL 99 100 99 102 101  CO2 23 25 25 26 24   GLUCOSE 117* 94 119* 121* 126*  BUN 22* 23* 25* 28* 21*  CREATININE 0.60* 0.58* 0.61 0.82 0.83  CALCIUM 10.0 10.1 10.2 9.9 10.0  MG  --   --   --  1.9 1.8  PHOS  --   --  5.0*  --   --    GFR: Estimated Creatinine Clearance: 95 mL/min (by C-G formula based on SCr of 0.83 mg/dL). Liver Function Tests: Recent Labs  Lab 07/23/19 0225 07/26/19 0950 07/27/19 0228 07/29/19 0254  AST 82*  --  41 36  ALT 177*  --  99* 83*  ALKPHOS 350*  --  199* 195*  BILITOT 0.5  --  0.7 1.2  PROT 7.4  --  7.5 7.9  ALBUMIN 3.1* 3.2* 3.0* 3.3*   No results for input(s): LIPASE, AMYLASE in the last 168 hours. No results for input(s): AMMONIA in the last 168 hours. Coagulation Profile: No results for input(s): INR, PROTIME in the last 168 hours. Cardiac Enzymes: No results for input(s): CKTOTAL, CKMB, CKMBINDEX, TROPONINI in the last 168 hours. BNP (last 3 results) No results for input(s): PROBNP in the last 8760 hours. HbA1C: No results for input(s): HGBA1C in the last 72 hours. CBG: Recent Labs  Lab 07/28/19 1628 07/28/19 2015 07/28/19 2340 07/29/19 0740 07/29/19 1220  GLUCAP 109* 121* 131* 114* 106*   Lipid Profile: No results for input(s): CHOL, HDL, LDLCALC, TRIG, CHOLHDL, LDLDIRECT in the last 72 hours. Thyroid Function Tests: No results for input(s): TSH, T4TOTAL, FREET4, T3FREE, THYROIDAB in the last 72 hours. Anemia Panel: No results for input(s): VITAMINB12, FOLATE, FERRITIN, TIBC, IRON, RETICCTPCT in the last 72 hours. Sepsis Labs: No results for input(s): PROCALCITON, LATICACIDVEN in the last 168 hours.  No results found for this or any previous visit (from the past 240 hour(s)).    Radiology  Studies: DG CHEST PORT 1 VIEW  Result Date: 07/28/2019 CLINICAL DATA:  Acute respiratory failure with hypoxia. EXAM: PORTABLE CHEST 1 VIEW COMPARISON:  Radiograph 07/24/2019. CT 06/19/2019 FINDINGS: Tracheostomy tube tip at the thoracic inlet. Weighted enteric tube in place with tip below the diaphragm not included in the field of view. Unchanged cardiomegaly with aortic atherosclerosis. Interstitial opacities are unchanged from prior. Retrocardiac opacity appears similar. No new airspace disease. No pneumothorax. IMPRESSION: 1. Unchanged cardiomegaly and interstitial opacities, favoring pulmonary edema. Retrocardiac opacity may be atelectasis or pneumonia, unchanged. 2. Stable support apparatus. Electronically Signed   By: Keith Rake M.D.   On: 07/28/2019 06:38     Scheduled Meds: . bacitracin  1 application Topical BID  . bethanechol  5 mg Per Tube TID  . chlorhexidine gluconate (MEDLINE KIT)  15 mL Mouth Rinse BID  . Chlorhexidine Gluconate Cloth  6 each Topical Daily  . cloNIDine  0.2 mg Transdermal  Weekly  . doxycycline  100 mg Oral Q12H  . feeding supplement (PRO-STAT SUGAR FREE 64)  30 mL Per Tube BID  . folic acid  1 mg Per Tube Daily  . furosemide  40 mg Intravenous Daily  . guaiFENesin  15 mL Per Tube Q6H  . heparin injection (subcutaneous)  5,000 Units Subcutaneous Q8H  . insulin aspart  0-15 Units Subcutaneous Q4H  . losartan  25 mg Oral Daily  . mouth rinse  15 mL Mouth Rinse 10 times per day  . methadone  5 mg Per Tube Q12H  . metoprolol tartrate  75 mg Per Tube BID  . multivitamin  15 mL Oral Daily  . nicotine  14 mg Transdermal Daily  . pantoprazole (PROTONIX) IV  40 mg Intravenous Q24H  . polyethylene glycol  17 g Per Tube Daily  . QUEtiapine  200 mg Per Tube BID  . sodium chloride flush  10-40 mL Intracatheter Q12H  . thiamine  100 mg Per Tube Daily   Continuous Infusions: . sodium chloride Stopped (07/17/19 2053)  . feeding supplement (OSMOLITE 1.2 CAL)  Stopped (07/29/19 0906)  . sodium chloride       LOS: 47 days    Time spent: 25 minutes spent in the coordination of care today.    Jonnie Finner, DO [facility]  If 7PM-7AM, please contact night-coverage [email or web address] 07/29/2019, 1:06 PM

## 2019-07-29 NOTE — Progress Notes (Signed)
Patient has been doing well with Passy-Muir valve. He tolerated wearing it for most of day shift 2/19 and has been tolerating it for over an hour so far today. He has been reporting thirst multiple times today. CorTrak tube has not yet been replaced (patient pulled it out while restrained, yesterday). I gave him suction swabs, which he tried to suck the water out of. I observed him swallowing after mouth care and he did not cough. Will follow-up to see if SLP can re-evaluate swallowing.

## 2019-07-29 NOTE — Progress Notes (Signed)
SLP Cancellation Note  Patient Details Name: [name]. MRN: [id number] DOB: 03/24/1961   Cancelled treatment:       Reason Eval/Treat Not Completed: Other (comment)Patient pulled out his NG tube and MD contacted SLP regarding possible retest of swallow function. Patient's last MBS was on 2/11 and so he is appropriate for retesting. Despite team's efforts, we were not able to coordinate everyone to complete today. Will attempt next date.   Sonia Baller, MA, CCC-SLP 07/29/19 3:52 PM Phone: [phone] Fax: [phone]

## 2019-07-30 ENCOUNTER — Inpatient Hospital Stay (HOSPITAL_COMMUNITY): Payer: Medicaid Other

## 2019-07-30 LAB — GLUCOSE, CAPILLARY
Glucose-Capillary: 101 mg/dL — ABNORMAL HIGH (ref 70–99)
Glucose-Capillary: 116 mg/dL — ABNORMAL HIGH (ref 70–99)
Glucose-Capillary: 94 mg/dL (ref 70–99)
Glucose-Capillary: 95 mg/dL (ref 70–99)
Glucose-Capillary: 99 mg/dL (ref 70–99)

## 2019-07-30 LAB — COMPREHENSIVE METABOLIC PANEL
ALT: 74 U/L — ABNORMAL HIGH (ref 0–44)
AST: 35 U/L (ref 15–41)
Albumin: 3.6 g/dL (ref 3.5–5.0)
Alkaline Phosphatase: 190 U/L — ABNORMAL HIGH (ref 38–126)
Anion gap: 15 (ref 5–15)
BUN: 29 mg/dL — ABNORMAL HIGH (ref 6–20)
CO2: 24 mmol/L (ref 22–32)
Calcium: 10.4 mg/dL — ABNORMAL HIGH (ref 8.9–10.3)
Chloride: 101 mmol/L (ref 98–111)
Creatinine, Ser: 0.93 mg/dL (ref 0.61–1.24)
GFR calc Af Amer: >60 mL/min (ref >60)
GFR calc non Af Amer: >60 mL/min (ref >60)
Glucose, Bld: 121 mg/dL — ABNORMAL HIGH (ref 70–99)
Potassium: 3.8 mmol/L (ref 3.5–5.1)
Sodium: 140 mmol/L (ref 135–145)
Total Bilirubin: 1 mg/dL (ref 0.3–1.2)
Total Protein: 8.7 g/dL — ABNORMAL HIGH (ref 6.5–8.1)

## 2019-07-30 LAB — CBC WITH DIFFERENTIAL/PLATELET
Abs Immature Granulocytes: 0.04 10*3/uL (ref 0.00–0.07)
Basophils Absolute: 0.1 10*3/uL (ref 0.0–0.1)
Basophils Relative: 1 %
Eosinophils Absolute: 0.1 10*3/uL (ref 0.0–0.5)
Eosinophils Relative: 1 %
HCT: 43.5 % (ref 39.0–52.0)
Hemoglobin: 13.7 g/dL (ref 13.0–17.0)
Immature Granulocytes: 0 %
Lymphocytes Relative: 15 %
Lymphs Abs: 1.6 10*3/uL (ref 0.7–4.0)
MCH: 25.7 pg — ABNORMAL LOW (ref 26.0–34.0)
MCHC: 31.5 g/dL (ref 30.0–36.0)
MCV: 81.6 fL (ref 80.0–100.0)
Monocytes Absolute: 0.7 10*3/uL (ref 0.1–1.0)
Monocytes Relative: 6 %
Neutro Abs: 8.3 10*3/uL — ABNORMAL HIGH (ref 1.7–7.7)
Neutrophils Relative %: 77 %
Platelets: 493 10*3/uL — ABNORMAL HIGH (ref 150–400)
RBC: 5.33 MIL/uL (ref 4.22–5.81)
RDW: 18.9 % — ABNORMAL HIGH (ref 11.5–15.5)
WBC: 10.8 10*3/uL — ABNORMAL HIGH (ref 4.0–10.5)
nRBC: 0 % (ref 0.0–0.2)

## 2019-07-30 MED ORDER — QUETIAPINE FUMARATE 100 MG PO TABS
200.0000 mg | ORAL_TABLET | Freq: Two times a day (BID) | ORAL | Status: DC
  Administered 2019-07-30 – 2019-08-04 (×10): 200 mg via ORAL
  Filled 2019-07-30 (×10): qty 2

## 2019-07-30 MED ORDER — METOPROLOL TARTRATE 5 MG/5ML IV SOLN
5.0000 mg | Freq: Four times a day (QID) | INTRAVENOUS | Status: DC | PRN
  Administered 2019-07-30: 5 mg via INTRAVENOUS

## 2019-07-30 MED ORDER — POLYETHYLENE GLYCOL 3350 17 G PO PACK
17.0000 g | PACK | Freq: Every day | ORAL | Status: DC
  Administered 2019-07-31 – 2019-08-01 (×2): 17 g via ORAL
  Filled 2019-07-30 (×4): qty 1

## 2019-07-30 MED ORDER — FOLIC ACID 1 MG PO TABS
1.0000 mg | ORAL_TABLET | Freq: Every day | ORAL | Status: DC
  Administered 2019-07-31 – 2019-08-04 (×5): 1 mg via ORAL
  Filled 2019-07-30 (×5): qty 1

## 2019-07-30 MED ORDER — METHADONE HCL 5 MG PO TABS
5.0000 mg | ORAL_TABLET | Freq: Two times a day (BID) | ORAL | Status: DC
  Administered 2019-07-30 – 2019-08-01 (×4): 5 mg via ORAL
  Filled 2019-07-30 (×4): qty 1

## 2019-07-30 MED ORDER — LACTULOSE 10 GM/15ML PO SOLN: 10.0000 g | Freq: Every day | ORAL | Status: DC | PRN

## 2019-07-30 MED ORDER — ACETAMINOPHEN 325 MG PO TABS
650.0000 mg | ORAL_TABLET | Freq: Four times a day (QID) | ORAL | Status: DC | PRN
  Administered 2019-07-31 – 2019-08-03 (×7): 650 mg via ORAL
  Filled 2019-07-30 (×7): qty 2

## 2019-07-30 MED ORDER — BETHANECHOL CHLORIDE 5 MG PO TABS
5.0000 mg | ORAL_TABLET | Freq: Three times a day (TID) | ORAL | Status: DC
  Administered 2019-07-30 – 2019-08-04 (×15): 5 mg via ORAL
  Filled 2019-07-30 (×17): qty 1

## 2019-07-30 MED ORDER — ACETAMINOPHEN 650 MG RE SUPP: 650.0000 mg | RECTAL | Status: DC | PRN

## 2019-07-30 MED ORDER — THIAMINE HCL 100 MG/ML IJ SOLN
100.0000 mg | Freq: Every day | INTRAMUSCULAR | Status: DC
  Administered 2019-07-30 – 2019-07-31 (×2): 100 mg via INTRAVENOUS
  Filled 2019-07-30 (×3): qty 2

## 2019-07-30 MED ORDER — METOPROLOL TARTRATE 5 MG/5ML IV SOLN
INTRAVENOUS | Status: AC
  Filled 2019-07-30: qty 5

## 2019-07-30 MED ORDER — RESOURCE THICKENUP CLEAR PO POWD
ORAL | Status: DC | PRN
  Filled 2019-07-30 (×3): qty 125

## 2019-07-30 MED ORDER — METOPROLOL TARTRATE 50 MG PO TABS
75.0000 mg | ORAL_TABLET | Freq: Two times a day (BID) | ORAL | Status: DC
  Administered 2019-07-30 – 2019-08-04 (×10): 75 mg via ORAL
  Filled 2019-07-30 (×10): qty 1

## 2019-07-30 NOTE — Progress Notes (Addendum)
[name]  PROGRESS NOTE    [name].  GYK:[id number] DOB: Dec 08, 1960 DOA: 06/12/2019 PCP: Alvester Chou, NP   Brief Narrative:   59 y.o.malewith a history of alcohol/tobacco abuse, COPD, hyponatremia,empyema in June 2020 and admission for acute pancreatitis in November 7939 complicated by respiratory failure requiring tracheostomy at that time presented on 06/12/2019 with neck swelling found to have cellulitis with acute airway compromise resulting in PEA arrest requiring emergent tracheostomy.Neck CTA-thickening epiglottis with pharyngeal and supraglottic wall edema,also multifocal pneumonia by CT-recieved empiric antibiotics, racemic epinephrine with Decadron per ENT recommendation. Blood cultures grew H.Influenza 4/4 , abx changed to Unasyn 1/5 >1/14 without any significant improvement. New right foot swelling noted on 1/14- MRI obtained to rule out infectious emboli-it showed synovitis and subcutaneous edema suggestive of cellulitis; orthopedics consulted for right ankle aspiration - aspirate with positive crystals suggestive ofgout- patient started on NSAIDs and steroids. Pain meds titrated ( Started on dilaudid, methadone,D/Cedfentanyl). Patient completed Unasyn course on 1/23.  HC complicated by delirium/agitation requiring versed/klonapin/precedex gtt while in ICU, anemiarequiring transfusion, elevated LFTs,and paroxysmal SVTs.On 1/17: Patient agitated early AM, q2h prn versed prescribed. Hb 5.2, transfused 2u pRBC; started methadone . On 1/19: Klonopin increased to tid and metoprolol added for SVTs but patient had subsequent bradycardia, clonidine tapered,precedex gtt discontinued . 1/21-1/24--placed on trach collar, then changed to 4 cuffless and sedation cut back slowly. However, on 1/30 patient returned to ICU for hypercarbic respiratory failure and encephalopathy--finally liberated from ventilator on 2/06- takenoff precedex and transferred to progressive care/ [facility] on 2/09. PCCM  continuingtracheostomy care, remains on trach collar. 2/11 Failed swallow study, PICC line removed 2/12. Clonazepam/methadone dosing being tapered down.Per wife,patient has history of dysphagia, suspected to have esophageal stenosis and was supposed to follow-up with GI for "stretching" as outpatient. Losartan/diltiazem discontinued2/14and patient given 250 normal saline bolus for hypotension.Seen by PCCM, ID, ENT and ortho during [facility].  07/30/19: Did not receive scheduled IV metoprolol ON for some reason. NEEDS to get this medicine today. Will titrate as necessary until NGT/cortrak replaced or he is allowed to take PO meds. He is quite calm this AM and interacting appropriately. SLP to take for swallow study today. Appreciate their assistance.   UPDATE: He has been cleared for dys 1 diet with full supervision. Meds to be crushed. IV metoprolol has now be re-ordered as PO 67m BID. RESUME his HTN meds (now ordered as oral).  Assessment & Plan:   Principal Problem:   Neck infection Active Problems:   Alcohol abuse   Poor dentition   Septic shock (HCC)   Laryngeal edema   Multifocal pneumonia   Airway compromise   Chronic hepatitis C without hepatic coma (HCC)   Acute respiratory failure with hypoxemia (HCC)   Cardiac arrest, cause unspecified (HCC)   Haemophilis influenzae bacteremia    Malnutrition of moderate degree   Atelectasis   Tracheostomy care ([place]   Acute metabolic encephalopathy   Hypercarbia   Tracheostomy status (HCC)   Pressure injury of skin  Acute hypoxic/hypercarbic respiratory failure, present on admission Neck cellulitus/swelling, tracheitis Multifocal PNA H. influenza bacteremia - now tracheostomy dependent - Patient presented with neck swelling/infection with tracheitis and airway compromise. - He received Decadron/racemic epinephrine per ENT recommendations and was intubated.  - Ended up with tracheostomy for failure to liberate from the  vent. - Completed IV unasyn on 1/23  - PCCM onboard, appreciate assistance, continues to have excess secretions, defer to PCCM. - continue doxycycline - 07/27/19: continue doxy through  tomorrow; wean O2, trach as able - 07/28/19: respirations improving, getting IV lasix for secretions, wean trach as able, doxy stops today.     - 07/29/19: White count is way up. He's afebrile, and clinically appears the same. Don't think we need to return to abx just yet. Continue suction and lasix for now as respiratory stat is actually ok. Follow white count for now     - 07/30/19: labs pending.  Acute metabolic encephalopathy - In the setting of above.  - He also has history of alcohol abuse/opiate dependence.  - Continue supportive care.  - Delirium precautions. - Methadone 5 mg BID; Methadone being tapered down while titrating clonidine. - return seroquel dose to 217m BID d/t agitation ON  - also has Seroquel/Haldol additional dosing available as needed for agitation. - 07/27/19: still intermittent agitation; monitor - 07/28/19: mentation better this morning, says cortrak is bothering his throat     - 07/30/19: He's appropriately interacting this morning; says he is going to quit all smoking and drinking  Dysphagia, history of esophageal stenosis  - Speech therapy following along - continue NPO for now - 07/28/19: he pulled cortrak, replace until cleared by SLP     - 07/29/19: cortrak still not replaced; switching some of his meds to IV or patch; can place NGT or let's see if SLP with re-eval since he's been doing ok with PMV     - 07/30/19: To go for swallow study today; appreciate SLP's assistance  Hypertension with hypotensive episode on 2/14 - Received IV fluid bolus. - losartan, diltiazem discontinued - remains on metoprolol and clonidine. - BP up and down today; can consider low dose losartan in AM if BP  continues this trend - 07/27/19: BP is ok today - 07/28/19: ok to resume losartan; also being diuresed with pulm, watch renal function     - 07/29/19: cortrak is out; he's getting lasix IV, will switch clonidine to patch, will schedule IV metoprolol     - 07/30/19: has clonidine patch and scheduled IV metoprolol; needs to be administered as scheduled; will titrate as necessary  COPD/tobacco use - Continue nicotine patch.   - albuterol as needed.  PSVT - Remains on beta-blockers.  - Off diltiazem.  - Patient did have PEA cardiac arrest on presentation requiring emergent tracheostomy.     - 07/30/19: Has scheduled IV metorpolol; needs to be administered as ordered  Alcohol abuse - Out of window for alcohol withdrawal.  - Continue multivitamin/thiamine.  Moderate protein calorie malnutrition with hypoalbuminemia - Due to prolonged hospitalization and n.p.o. status/confusion.  - Albumin level 2.9-3.1.  - On tube feeds currently. - SLP onboard     - 07/29/19: pulled cortrak, have talked with SLP about schedule for swallow eval, they will review today; otherwise, he will need to have NGT placed     - 07/30/19: To have swallow study today; appreciate SLP assistance  DVT prophylaxis: heparin Code Status: FULL   Disposition Plan: SNF v LTACH  Consultants:   PCCM  Procedures:   Intubation/Extubation  Trach  ROS:  Denies CP, N, V, ab pain . Remainder 10-pt ROS is negative for all not previously mentioned.  Subjective: "I'm gonna cut off the smokes and drinking."  Objective: Vitals:   07/30/19 0354 07/30/19 0418 07/30/19 0729 07/30/19 0824  BP: (!) 166/98   (!) 150/108  Pulse: (!) 108 (!) 109 86 (!) 123  Resp: (!) 23 (!) 23 15 (!) 22  Temp: 98.3 F (36.8 C)  99.1 F (37.3 C)  TempSrc: Oral   Oral  SpO2: 99% 99% 95% 97%  Weight:      Height:        Intake/Output Summary (Last 24 hours) at 07/30/2019 1034 Last data  filed at 07/30/2019 0021 Gross per 24 hour  Intake 10 ml  Output 850 ml  Net -840 ml   Filed Weights   07/14/19 0500 07/18/19 0427 07/21/19 0342  Weight: 69.7 kg 70.5 kg 70.2 kg    Examination:  General: 59 y.o. male resting in bed in NAD Cardiovascular: tachy, +S1, S2, no m/g/r Respiratory: UAT, PMV in place, normal WOB GI: BS+, NDNT, soft MSK: No e/c/c Neuro: Alert to name, follows commands Psyc: Appropriate interaction, calm/cooperative   Data Reviewed: I have personally reviewed following labs and imaging studies.  CBC: Recent Labs  Lab 07/25/19 0141 07/26/19 0950 07/27/19 0228 07/29/19 0254  WBC 8.2 9.3 8.6 27.0*  NEUTROABS  --  6.7 5.3 25.7*  HGB 11.3* 11.8* 11.0* 12.7*  HCT 37.6* 38.9* 36.1* 39.8  MCV 84.7 85.3 85.1 81.7  PLT 369 427* 413* 782*   Basic Metabolic Panel: Recent Labs  Lab 07/25/19 0141 07/26/19 0950 07/27/19 0228 07/29/19 0254  NA 136 139 139 138  K 4.2 4.5 4.2 4.5  CL 100 99 102 101  CO2 25 25 26 24   GLUCOSE 94 119* 121* 126*  BUN 23* 25* 28* 21*  CREATININE 0.58* 0.61 0.82 0.83  CALCIUM 10.1 10.2 9.9 10.0  MG  --   --  1.9 1.8  PHOS  --  5.0*  --   --    GFR: Estimated Creatinine Clearance: 95 mL/min (by C-G formula based on SCr of 0.83 mg/dL). Liver Function Tests: Recent Labs  Lab 07/26/19 0950 07/27/19 0228 07/29/19 0254  AST  --  41 36  ALT  --  99* 83*  ALKPHOS  --  199* 195*  BILITOT  --  0.7 1.2  PROT  --  7.5 7.9  ALBUMIN 3.2* 3.0* 3.3*   No results for input(s): LIPASE, AMYLASE in the last 168 hours. No results for input(s): AMMONIA in the last 168 hours. Coagulation Profile: No results for input(s): INR, PROTIME in the last 168 hours. Cardiac Enzymes: No results for input(s): CKTOTAL, CKMB, CKMBINDEX, TROPONINI in the last 168 hours. BNP (last 3 results) No results for input(s): PROBNP in the last 8760 hours. HbA1C: No results for input(s): HGBA1C in the last 72 hours. CBG: Recent Labs  Lab  07/29/19 1529 07/29/19 2009 07/30/19 0009 07/30/19 0352 07/30/19 0820  GLUCAP 101* 106* 101* 95 99   Lipid Profile: No results for input(s): CHOL, HDL, LDLCALC, TRIG, CHOLHDL, LDLDIRECT in the last 72 hours. Thyroid Function Tests: No results for input(s): TSH, T4TOTAL, FREET4, T3FREE, THYROIDAB in the last 72 hours. Anemia Panel: No results for input(s): VITAMINB12, FOLATE, FERRITIN, TIBC, IRON, RETICCTPCT in the last 72 hours. Sepsis Labs: No results for input(s): PROCALCITON, LATICACIDVEN in the last 168 hours.  No results found for this or any previous visit (from the past 240 hour(s)).    Radiology Studies: No results found.   Scheduled Meds: . bacitracin  1 application Topical BID  . bethanechol  5 mg Per Tube TID  . chlorhexidine gluconate (MEDLINE KIT)  15 mL Mouth Rinse BID  . Chlorhexidine Gluconate Cloth  6 each Topical Daily  . cloNIDine  0.2 mg Transdermal Weekly  . feeding supplement (PRO-STAT SUGAR FREE 64)  30 mL Per Tube BID  .  folic acid  1 mg Per Tube Daily  . guaiFENesin  15 mL Per Tube Q6H  . heparin injection (subcutaneous)  5,000 Units Subcutaneous Q8H  . insulin aspart  0-15 Units Subcutaneous Q4H  . losartan  25 mg Oral Daily  . mouth rinse  15 mL Mouth Rinse 10 times per day  . methadone  5 mg Per Tube Q12H  . metoprolol tartrate  5 mg Intravenous Q6H  . multivitamin  15 mL Oral Daily  . nicotine  14 mg Transdermal Daily  . pantoprazole (PROTONIX) IV  40 mg Intravenous Q24H  . polyethylene glycol  17 g Per Tube Daily  . QUEtiapine  200 mg Per Tube BID  . sodium chloride flush  10-40 mL Intracatheter Q12H  . thiamine injection  100 mg Intravenous Daily   Continuous Infusions: . sodium chloride Stopped (07/17/19 2053)  . feeding supplement (OSMOLITE 1.2 CAL) Stopped (07/29/19 0906)  . sodium chloride       LOS: 48 days    Time spent: 25 minutes spent in the coordination of care today.    Jonnie Finner, DO [facility]  If  7PM-7AM, please contact night-coverage [email or web address] 07/30/2019, 10:34 AM

## 2019-07-30 NOTE — Progress Notes (Signed)
Modified Barium Swallow Progress Note  Patient Details  Name: [name]. MRN: [id number] Date of Birth: March 13, 1961  Today's Date: 07/30/2019  Modified Barium Swallow completed.  Full report located under Chart Review in the Imaging Section.  Brief recommendations include the following:  Clinical Impression  Pt has demonstrated some improvement in swallow function as evidenced by improved epiglottic inversion, reduced pharyngeal residue, and reduced laryngeal invasion. He presents with moderate oropharyngeal dysphagia characterized by impaired bolus control, a pharyngeal delay, reduced hyolaryngeal elevation, and reduced anterior laryngeal movement. He demonstrated vallecular residue across consistencies secondary to reduced epiglottic inversion; amount of residue increased with bolus size and viscosity. Epiglottic inversion was complete with more viscous boluses such as honey thick liquids and puree. The swallow was often triggered with head of the bolus at the level of pyriform sinuses and penetration (PAS 2,3) and aspiration (PAS 8) were noted. Pharyngeal residue was reduced and epiglottic inversion improved with use of an effortful swallow; however, laryngeal invasion was still demonstration. A dysphagia 1 (puree) diet with honey thick liquids is recommended at this time with full supervision for observance of swallowing precautions. SLP will continue to follow to assess diet tolerance and for dysphagia treatment.    Swallow Evaluation Recommendations       SLP Diet Recommendations: Dysphagia 1 (Puree) solids;Honey thick liquids   Liquid Administration via: Cup;No straw   Medication Administration: Crushed with puree (1/2 tsp)   Supervision: Patient able to self feed;Full supervision/cueing for compensatory strategies   Compensations: Small sips/bites;Slow rate;Follow solids with liquid;Effortful swallow   Postural Changes: Seated upright at 90 degrees   Oral Care  Recommendations: Oral care BID   Other Recommendations: Place PMSV during PO intake;Order thickener from pharmacy   [place] I. Hardin Negus, [place], [place] Office number [phone] Pager [place] 07/30/2019,1:29 PM

## 2019-07-30 NOTE — Progress Notes (Signed)
Patient is making remarkable progress this weekend. He is much more oriented & is able to ask for assistance. He is becoming continent again, following safety precautions and is no longer in restraints. He has been tolerating his passy muir valve for the majority of the day shift. He enjoys socializing and conversing with staff and this keeps him motivated. He passed his swallow study to eat a dysphagia diet with honey thick liquids and meds crushed in puree today. This afternoon, he was able to stand and use a rolling walker to walk around his bed and sit up in the recliner. His gait was steady and he only needed supervision to do this.

## 2019-07-30 NOTE — Progress Notes (Signed)
  Speech Language Pathology Treatment: Dysphagia  Patient Details Name: [name]. MRN: [id number] DOB: 10-17-1960 Today's Date: 07/30/2019 Time: [id number] SLP Time Calculation (min) (ACUTE ONLY): 11 min  Assessment / Plan / Recommendation Clinical Impression  Pt was seen for dysphagia treatment and was cooperative during the session. Cuff was deflated and PMV in place upon SLP's entry. Pt tolerated PMV throughout the session with stable, WFL O2 and RR. He was able to converse with the SLP without difficulty but speech intelligibility was reduced secondary to imprecise articulation combined with reduced vocal intensity. He tolerated puree solids without overt s/sx of aspiration but exhibited coughing and throat clearing with ice chips and thin liquids via tsp. Pt removed Cortrak on 2/19 and therefore has not been able to receive some medications. MBS will be conducted to assess improvement in swallow and to determine his ability to safely tolerate a p.o. diet.   HPI HPI: 59 year old man with a history of alcohol use disorder, tobacco use disorder, HTN, and recent admission on 04/10/2019 for pancreatitis and hyponatremia who presents for evaluation of shortness of breath and neck swelling with loss of airway and PEA arrest requiring emergent tracheostomy. Trach changed from 4 cuffless back to Shiley 6 cuffed on 1/30 after RR event, and transferred back to ICU. Chest x-ray of 2/19: Retrocardiac opacity may be atelectasis or pneumonia      SLP Plan  Continue with current plan of care       Recommendations  Diet recommendations: NPO Medication Administration: Via alternative means      Patient may use Passy-Muir Speech Valve: During all waking hours (remove during sleep) PMSV Supervision: Full MD: Please consider changing trach tube to : Smaller size;Cuffless         Oral Care Recommendations: Oral care QID Follow up Recommendations: Skilled Nursing facility SLP Visit Diagnosis:  Dysphagia, oropharyngeal phase (R13.12);Other (comment)(Dysphonia) Plan: Continue with current plan of care       Perlie Stene I. Hardin Negus, [place], [place] Office number [phone] Pager [place] 07/30/2019, 9:07 AM

## 2019-07-30 NOTE — Progress Notes (Signed)
Received call from telemetry with below HR. Patient up in chair eating dinner and denies any chest pain. He does not appear in any distress, no diaphoresis or other concerning assessment findings. Paged Dr. Marylyn Ishihara - see new orders.   07/30/19 1825  MEWS Score  Pulse Rate (!) 140  ECG Heart Rate (!) 142  Resp (!) 35  SpO2 97 %  MEWS Score  MEWS Temp 0  MEWS Systolic 0  MEWS Pulse 3  MEWS RR 2  MEWS LOC 0  MEWS Score 5  MEWS Score Color Red

## 2019-07-30 NOTE — Progress Notes (Signed)
Patient is down for a swallow study. RT will return when patient is back in room.

## 2019-07-31 LAB — COMPREHENSIVE METABOLIC PANEL
ALT: 65 U/L — ABNORMAL HIGH (ref 0–44)
AST: 28 U/L (ref 15–41)
Albumin: 3.2 g/dL — ABNORMAL LOW (ref 3.5–5.0)
Alkaline Phosphatase: 168 U/L — ABNORMAL HIGH (ref 38–126)
Anion gap: 17 — ABNORMAL HIGH (ref 5–15)
BUN: 33 mg/dL — ABNORMAL HIGH (ref 6–20)
CO2: 23 mmol/L (ref 22–32)
Calcium: 10.2 mg/dL (ref 8.9–10.3)
Chloride: 101 mmol/L (ref 98–111)
Creatinine, Ser: 1.03 mg/dL (ref 0.61–1.24)
GFR calc Af Amer: >60 mL/min (ref >60)
GFR calc non Af Amer: >60 mL/min (ref >60)
Glucose, Bld: 123 mg/dL — ABNORMAL HIGH (ref 70–99)
Potassium: 3.5 mmol/L (ref 3.5–5.1)
Sodium: 141 mmol/L (ref 135–145)
Total Bilirubin: 0.9 mg/dL (ref 0.3–1.2)
Total Protein: 8.2 g/dL — ABNORMAL HIGH (ref 6.5–8.1)

## 2019-07-31 LAB — GLUCOSE, CAPILLARY
Glucose-Capillary: 103 mg/dL — ABNORMAL HIGH (ref 70–99)
Glucose-Capillary: 104 mg/dL — ABNORMAL HIGH (ref 70–99)
Glucose-Capillary: 109 mg/dL — ABNORMAL HIGH (ref 70–99)
Glucose-Capillary: 122 mg/dL — ABNORMAL HIGH (ref 70–99)
Glucose-Capillary: 127 mg/dL — ABNORMAL HIGH (ref 70–99)
Glucose-Capillary: 133 mg/dL — ABNORMAL HIGH (ref 70–99)

## 2019-07-31 LAB — CBC WITH DIFFERENTIAL/PLATELET
Abs Immature Granulocytes: 0.04 10*3/uL (ref 0.00–0.07)
Basophils Absolute: 0.1 10*3/uL (ref 0.0–0.1)
Basophils Relative: 1 %
Eosinophils Absolute: 0.2 10*3/uL (ref 0.0–0.5)
Eosinophils Relative: 1 %
HCT: 41.9 % (ref 39.0–52.0)
Hemoglobin: 13.2 g/dL (ref 13.0–17.0)
Immature Granulocytes: 0 %
Lymphocytes Relative: 18 %
Lymphs Abs: 1.9 10*3/uL (ref 0.7–4.0)
MCH: 25.8 pg — ABNORMAL LOW (ref 26.0–34.0)
MCHC: 31.5 g/dL (ref 30.0–36.0)
MCV: 82 fL (ref 80.0–100.0)
Monocytes Absolute: 0.8 10*3/uL (ref 0.1–1.0)
Monocytes Relative: 8 %
Neutro Abs: 7.7 10*3/uL (ref 1.7–7.7)
Neutrophils Relative %: 72 %
Platelets: 453 10*3/uL — ABNORMAL HIGH (ref 150–400)
RBC: 5.11 MIL/uL (ref 4.22–5.81)
RDW: 18.8 % — ABNORMAL HIGH (ref 11.5–15.5)
WBC: 10.7 10*3/uL — ABNORMAL HIGH (ref 4.0–10.5)
nRBC: 0 % (ref 0.0–0.2)

## 2019-07-31 LAB — MAGNESIUM: Magnesium: 1.9 mg/dL (ref 1.7–2.4)

## 2019-07-31 MED ORDER — GUAIFENESIN 200 MG PO TABS
400.0000 mg | ORAL_TABLET | Freq: Four times a day (QID) | ORAL | Status: DC
  Administered 2019-07-31 – 2019-08-04 (×16): 400 mg via ORAL
  Filled 2019-07-31 (×18): qty 2

## 2019-07-31 MED ORDER — ENSURE ENLIVE PO LIQD
237.0000 mL | Freq: Four times a day (QID) | ORAL | Status: DC
  Administered 2019-07-31 – 2019-08-04 (×15): 237 mL via ORAL

## 2019-07-31 NOTE — Progress Notes (Addendum)
CSW continuing to follow for discharge needs and notes patient's progress. SNF barriers remain tracheostomy and methadone.   Percell Locus Samit Sylve LCSW [phone]

## 2019-07-31 NOTE — Progress Notes (Signed)
[name]  PROGRESS NOTE    [name].  IRC:[id number] DOB: [birth year] DOA: 06/12/2019 PCP: Alvester Chou, NP   Brief Narrative:   59 y.o.malewith a history of alcohol/tobacco abuse, COPD, hyponatremia,empyema in June 2020 and admission for acute pancreatitis in November 5102 complicated by respiratory failure requiring tracheostomy at that time presented on 06/12/2019 with neck swelling found to have cellulitis with acute airway compromise resulting in PEA arrest requiring emergent tracheostomy.Neck CTA-thickening epiglottis with pharyngeal and supraglottic wall edema,also multifocal pneumonia by CT-recieved empiric antibiotics, racemic epinephrine with Decadron per ENT recommendation. Blood cultures grew H.Influenza 4/4 , abx changed to Unasyn 1/5 >1/14 without any significant improvement. New right foot swelling noted on 1/14- MRI obtained to rule out infectious emboli-it showed synovitis and subcutaneous edema suggestive of cellulitis; orthopedics consulted for right ankle aspiration - aspirate with positive crystals suggestive ofgout- patient started on NSAIDs and steroids. Pain meds titrated ( Started on dilaudid, methadone,D/Cedfentanyl). Patient completed Unasyn course on 1/23.  HC complicated by delirium/agitation requiring versed/klonapin/precedex gtt while in ICU, anemiarequiring transfusion, elevated LFTs,and paroxysmal SVTs.On 1/17: Patient agitated early AM, q2h prn versed prescribed. Hb 5.2, transfused 2u pRBC; started methadone . On 1/19: Klonopin increased to tid and metoprolol added for SVTs but patient had subsequent bradycardia, clonidine tapered,precedex gtt discontinued . 1/21-1/24--placed on trach collar, then changed to 4 cuffless and sedation cut back slowly. However, on 1/30 patient returned to ICU for hypercarbic respiratory failure and encephalopathy--finally liberated from ventilator on 2/06- takenoff precedex and transferred to progressive care/ [facility] on 2/09. PCCM  continuingtracheostomy care, remains on trach collar. 2/11 Failed swallow study, PICC line removed 2/12. Clonazepam/methadone dosing being tapered down.Per wife,patient has history of dysphagia, suspected to have esophageal stenosis and was supposed to follow-up with GI for "stretching" as outpatient. Losartan/diltiazem discontinued2/14and patient given 250 normal saline bolus for hypotension.Seen by PCCM, ID, ENT and ortho during [facility].  07/31/19: He looks TONS better this morning. Much more interactive and appropriate this morning. He is tolerating diet thus far. BP improving on current therapy. HR is better. He is just better over all. Lurline Idol has been downsized this morning. Greatly appreciate everyone's assistance. Will still likely need placement. Let's see if we can get him more mobile today.   Assessment & Plan:   Principal Problem:   Neck infection Active Problems:   Alcohol abuse   Poor dentition   Septic shock (HCC)   Laryngeal edema   Multifocal pneumonia   Airway compromise   Chronic hepatitis C without hepatic coma (HCC)   Acute respiratory failure with hypoxemia (HCC)   Cardiac arrest, cause unspecified (HCC)   Haemophilis influenzae bacteremia    Malnutrition of moderate degree   Atelectasis   Tracheostomy care ([place])   Acute metabolic encephalopathy   Hypercarbia   Tracheostomy status (HCC)   Pressure injury of skin  Acute hypoxic/hypercarbic respiratory failure, present on admission Neck cellulitus/swelling, tracheitis Multifocal PNA H. influenza bacteremia - now tracheostomy dependent - Patient presented with neck swelling/infection with tracheitis and airway compromise. - He received Decadron/racemic epinephrine per ENT recommendations and was intubated.  - Ended up with tracheostomy for failure to liberate from the vent. - Completed IV unasyn on 1/23  - PCCM onboard, appreciate assistance, continues to have excess secretions, defer to  PCCM. - continue doxycycline - 07/27/19: continue doxy through tomorrow; wean O2, trach as able - 07/28/19: respirations improving, getting IV lasix for secretions, wean trach as able, doxy stops today.     -  07/29/19: White count is way up. He's afebrile, and clinically appears the same. Don't think we need to return to abx just yet. Continue suction and lasix for now as respiratory stat is actually ok. Follow white count for now     - 07/31/19: WBC stabilized; vital are better, he is improving.  Acute metabolic encephalopathy - In the setting of above.  - He also has history of alcohol abuse/opiate dependence.  - Continue supportive care.  - Delirium precautions. - Methadone 5 mg BID; Methadone being tapered down while titrating clonidine. - return seroquel dose to 271m BID d/t agitation ON  - also has Seroquel/Haldol additional dosing available as needed for agitation. - 07/27/19: still intermittent agitation; monitor - 07/28/19: mentation better this morning, says cortrak is bothering his throat     - 07/31/19: Mentation has been much better over past couple of days; continuing to improve  Dysphagia, history of esophageal stenosis  - Speech therapy following along - continue NPO for now - 07/28/19: he pulled cortrak, replace until cleared by SLP     - 07/29/19: cortrak still not replaced; switching some of his meds to IV or patch; can place NGT or let's see if SLP with re-eval since he's been doing ok with PMV     - 07/31/19: he has been cleared for diet and is tolerating  Hypertension with hypotensive episode on 2/14 - Received IV fluid bolus. - losartan, diltiazem discontinued - remains on metoprolol and clonidine. - BP up and down today; can consider low dose losartan in AM if BP continues this trend - 07/27/19: BP is ok today - 07/28/19: ok to resume losartan; also being diuresed with pulm, watch  renal function     - 07/29/19: cortrak is out; he's getting lasix IV, will switch clonidine to patch, will schedule IV metoprolol     - 07/31/19: all BP meds switched back to PO w/ exception of clonidine.   COPD/tobacco use - Continue nicotine patch.   - albuterol as needed.  PSVT - Remains on beta-blockers.  - Off diltiazem.  - Patient did have PEA cardiac arrest on presentation requiring emergent tracheostomy.     - 07/31/19: remains on metoprolol, HR improved  Alcohol abuse - Out of window for alcohol withdrawal.  - Continue multivitamin/thiamine.  Moderate protein calorie malnutrition with hypoalbuminemia - Due to prolonged hospitalization and n.p.o. status/confusion.  - Albumin level 2.9-3.1.  - On tube feeds currently. - SLP onboard     - 07/29/19: pulled cortrak, have talked with SLP about schedule for swallow eval, they will review today; otherwise, he will need to have NGT placed     - 07/31/19: has been cleared for PO diet; tolerating  DVT prophylaxis: heparin Code Status: FULL Family Communication: Spoke with s/o ([name] by phone   Disposition Plan: Right now the plan is for SNF or LTACH, but may change as he improves.   Consultants:   PCCM  Procedures:   Trach  ROS:  Denies CP, N, V, ab pain. Remainder 10-pt ROS is negative for all not previously mentioned.  Subjective: Very talkative today and happy to eat.  Objective: Vitals:   07/31/19 0734 07/31/19 0829 07/31/19 1108 07/31/19 1123  BP: (!) 142/96   140/77  Pulse: (!) 108 96 78 80  Resp: 20 18 19  (!) 21  Temp: 99.1 F (37.3 C)   97.9 F (36.6 C)  TempSrc: Oral   Oral  SpO2: 97% 99% 97% 98%  Weight:  Height:        Intake/Output Summary (Last 24 hours) at 07/31/2019 1222 Last data filed at 07/31/2019 6122 Gross per 24 hour  Intake 370 ml  Output 900 ml  Net -530 ml   Filed Weights   07/14/19 0500 07/18/19 0427 07/21/19 0342   Weight: 69.7 kg 70.5 kg 70.2 kg    Examination:  General: 59 y.o. male resting in bed in NAD Cardiovascular: RRR, +S1, S2, no m/g/r, equal pulses throughout Respiratory: soft UAT, soft rhonchi RUL, normal WOB, trach'd GI: BS+, NDNT, no masses noted, no organomegaly noted MSK: No e/c/c Neuro: Alert to name, follows commands Psyc: Appropriate interaction and affect, calm/cooperative   Data Reviewed: I have personally reviewed following labs and imaging studies.  CBC: Recent Labs  Lab 07/26/19 0950 07/27/19 0228 07/29/19 0254 07/30/19 1315 07/31/19 0135  WBC 9.3 8.6 27.0* 10.8* 10.7*  NEUTROABS 6.7 5.3 25.7* 8.3* 7.7  HGB 11.8* 11.0* 12.7* 13.7 13.2  HCT 38.9* 36.1* 39.8 43.5 41.9  MCV 85.3 85.1 81.7 81.6 82.0  PLT 427* 413* 450* 493* 449*   Basic Metabolic Panel: Recent Labs  Lab 07/26/19 0950 07/27/19 0228 07/29/19 0254 07/30/19 1315 07/31/19 0135  NA 139 139 138 140 141  K 4.5 4.2 4.5 3.8 3.5  CL 99 102 101 101 101  CO2 25 26 24 24 23   GLUCOSE 119* 121* 126* 121* 123*  BUN 25* 28* 21* 29* 33*  CREATININE 0.61 0.82 0.83 0.93 1.03  CALCIUM 10.2 9.9 10.0 10.4* 10.2  MG  --  1.9 1.8  --  1.9  PHOS 5.0*  --   --   --   --    GFR: Estimated Creatinine Clearance: 76.6 mL/min (by C-G formula based on SCr of 1.03 mg/dL). Liver Function Tests: Recent Labs  Lab 07/26/19 0950 07/27/19 0228 07/29/19 0254 07/30/19 1315 07/31/19 0135  AST  --  41 36 35 28  ALT  --  99* 83* 74* 65*  ALKPHOS  --  199* 195* 190* 168*  BILITOT  --  0.7 1.2 1.0 0.9  PROT  --  7.5 7.9 8.7* 8.2*  ALBUMIN 3.2* 3.0* 3.3* 3.6 3.2*   No results for input(s): LIPASE, AMYLASE in the last 168 hours. No results for input(s): AMMONIA in the last 168 hours. Coagulation Profile: No results for input(s): INR, PROTIME in the last 168 hours. Cardiac Enzymes: No results for input(s): CKTOTAL, CKMB, CKMBINDEX, TROPONINI in the last 168 hours. BNP (last 3 results) No results for input(s): PROBNP  in the last 8760 hours. HbA1C: No results for input(s): HGBA1C in the last 72 hours. CBG: Recent Labs  Lab 07/30/19 2005 07/31/19 0007 07/31/19 0405 07/31/19 0733 07/31/19 1121  GLUCAP 94 127* 122* 109* 133*   Lipid Profile: No results for input(s): CHOL, HDL, LDLCALC, TRIG, CHOLHDL, LDLDIRECT in the last 72 hours. Thyroid Function Tests: No results for input(s): TSH, T4TOTAL, FREET4, T3FREE, THYROIDAB in the last 72 hours. Anemia Panel: No results for input(s): VITAMINB12, FOLATE, FERRITIN, TIBC, IRON, RETICCTPCT in the last 72 hours. Sepsis Labs: No results for input(s): PROCALCITON, LATICACIDVEN in the last 168 hours.  No results found for this or any previous visit (from the past 240 hour(s)).    Radiology Studies: DG Swallowing Func-Speech Pathology  Result Date: 07/30/2019 Objective Swallowing Evaluation: Type of Study: MBS-Modified Barium Swallow Study  Patient Details Name: [name]. MRN: [id number] Date of Birth: Sep 06, 1960 Today's Date: 07/30/2019 Time: SLP Start Time (ACUTE ONLY): 1107 -  SLP Stop Time (ACUTE ONLY): 1120 SLP Time Calculation (min) (ACUTE ONLY): 13 min Past Medical History: Past Medical History: Diagnosis Date . Alcohol abuse  . Alcoholic ([place])  . Back pain  . CAP (community acquired pneumonia) 11/26/2018 . COPD (chronic obstructive pulmonary disease) ([place])  . Empyema of right pleural space ([place]) 11/26/2018 . Heavy smoker  . Hep C w/ coma, chronic ([place])  . Loose, teeth  . Pancreatitis  . Poor dentition  . Tobacco abuse  Past Surgical History: Past Surgical History: Procedure Laterality Date . ARTERY REPAIR  2013  neck . BIOPSY  02/19/2019  Procedure: BIOPSY;  Surgeon: Ronnette Juniper, MD;  Location: [facility] ENDOSCOPY;  Service: Gastroenterology;; . ESOPHAGOGASTRODUODENOSCOPY (EGD) WITH PROPOFOL N/A 02/19/2019  Procedure: ESOPHAGOGASTRODUODENOSCOPY (EGD) WITH PROPOFOL;  Surgeon: Ronnette Juniper, MD;  Location: [place];  Service: Gastroenterology;  Laterality: N/A; .  FOREIGN BODY REMOVAL  02/19/2019  Procedure: FOREIGN BODY REMOVAL;  Surgeon: Ronnette Juniper, MD;  Location: [facility] ENDOSCOPY;  Service: Gastroenterology;; . KNEE SURGERY  2013  rt . MICROLARYNGOSCOPY N/A 07/31/2014  Procedure: MICROLARYNGOSCOPY WITH BIOPSY ;  Surgeon: Rozetta Nunnery, MD;  Location: [place];  Service: ENT;  Laterality: N/A; . NECK SURGERY  2013  cervical fusion- . VASCULAR SURGERY   . VIDEO ASSISTED THORACOSCOPY (VATS)/EMPYEMA Right 11/26/2018  Procedure: VIDEO ASSISTED THORACOSCOPY (VATS)/EMPYEMA;  Surgeon: Rexene Alberts, MD;  Location: [place];  Service: Thoracic;  Laterality: Right; [name] VIDEO BRONCHOSCOPY  11/26/2018  Procedure: Video Bronchoscopy;  Surgeon: Rexene Alberts, MD;  Location: [place];  Service: Thoracic;; HPI: 59 year old man with a history of alcohol use disorder, tobacco use disorder, HTN, and recent admission on 04/10/2019 for pancreatitis and hyponatremia who presents for evaluation of shortness of breath and neck swelling with loss of airway and PEA arrest requiring emergent tracheostomy. Trach changed from 4 cuffless back to [place] 6 cuffed on 1/30 after RR event, and transferred back to ICU. Chest x-ray of 2/19: Retrocardiac opacity may be atelectasis or pneumonia  Subjective: Pt was alert and cooperative Assessment / Plan / Recommendation CHL IP CLINICAL IMPRESSIONS 07/30/2019 Clinical Impression Pt has demonstrated some improvement in swallow function as evidenced by improved epiglottic inversion, reduced pharyngeal residue, and reduced laryngeal invasion. He presents with moderate oropharyngeal dysphagia characterized by impaired bolus control, a pharyngeal delay, reduced hyolaryngeal elevation, and reduced anterior laryngeal movement. He demonstrated vallecular residue across consistencies secondary to reduced epiglottic inversion; amount of residue increased with bolus size and viscosity. Epiglottic inversion was complete with more viscous boluses such as honey  thick liquids and puree. The swallow was often triggered with head of the bolus at the level of pyriform sinuses and penetration (PAS 2,3) and aspiration (PAS 8) were noted. Pharyngeal residue was reduced and epiglottic inversion improved with use of an effortful swallow; however, laryngeal invasion was still demonstration. A dysphagia 1 (puree) diet with honey thick liquids is recommended at this time with full supervision for observance of swallowing precautions. SLP will continue to follow to assess diet tolerance and for dysphagia treatment.  SLP Visit Diagnosis Dysphagia, oropharyngeal phase (R13.12) Attention and concentration deficit following -- Frontal lobe and executive function deficit following -- Impact on safety and function Mild aspiration risk;Moderate aspiration risk   CHL IP TREATMENT RECOMMENDATION 07/30/2019 Treatment Recommendations Therapy as outlined in treatment plan below   Prognosis 07/30/2019 Prognosis for Safe Diet Advancement Good Barriers to Reach Goals Cognitive deficits Barriers/Prognosis Comment -- CHL IP DIET RECOMMENDATION 07/30/2019 SLP Diet Recommendations Dysphagia 1 (  Puree) solids;Honey thick liquids Liquid Administration via Cup;No straw Medication Administration Crushed with puree Compensations Small sips/bites;Slow rate;Follow solids with liquid;Effortful swallow Postural Changes Seated upright at 90 degrees   CHL IP OTHER RECOMMENDATIONS 07/30/2019 Recommended Consults -- Oral Care Recommendations Oral care BID Other Recommendations Place PMSV during PO intake;Order thickener from pharmacy   CHL IP FOLLOW UP RECOMMENDATIONS 07/30/2019 Follow up Recommendations Skilled Nursing facility   [facility] IP FREQUENCY AND DURATION 07/30/2019 Speech Therapy Frequency (ACUTE ONLY) min 2x/week Treatment Duration 2 weeks      CHL IP ORAL PHASE 07/30/2019 Oral Phase Impaired Oral - Pudding Teaspoon -- Oral - Pudding Cup -- Oral - Honey Teaspoon -- Oral - Honey Cup Lingual/palatal residue Oral -  Nectar Teaspoon -- Oral - Nectar Cup Decreased bolus cohesion;Lingual/palatal residue Oral - Nectar Straw -- Oral - Thin Teaspoon -- Oral - Thin Cup Premature spillage Oral - Thin Straw -- Oral - Puree Lingual/palatal residue Oral - Mech Soft -- Oral - Regular -- Oral - Multi-Consistency -- Oral - Pill -- Oral Phase - Comment --  CHL IP PHARYNGEAL PHASE 07/30/2019 Pharyngeal Phase Impaired Pharyngeal- Pudding Teaspoon -- Pharyngeal -- Pharyngeal- Pudding Cup -- Pharyngeal -- Pharyngeal- Honey Teaspoon -- Pharyngeal -- Pharyngeal- Honey Cup Penetration/Aspiration during swallow;Pharyngeal residue - valleculae;Pharyngeal residue - pyriform;Pharyngeal residue - posterior pharnyx Pharyngeal Material enters airway, remains ABOVE vocal cords then ejected out Pharyngeal- Nectar Teaspoon Penetration/Aspiration during swallow;Reduced anterior laryngeal mobility;Reduced laryngeal elevation;Pharyngeal residue - valleculae;Pharyngeal residue - pyriform;Reduced epiglottic inversion Pharyngeal Material enters airway, remains ABOVE vocal cords and not ejected out Pharyngeal- Nectar Cup -- Pharyngeal -- Pharyngeal- Nectar Straw -- Pharyngeal -- Pharyngeal- Thin Teaspoon Penetration/Aspiration during swallow;Reduced anterior laryngeal mobility;Reduced laryngeal elevation;Pharyngeal residue - valleculae;Pharyngeal residue - pyriform;Delayed swallow initiation-vallecula;Delayed swallow initiation-pyriform sinuses;Reduced epiglottic inversion Pharyngeal Material enters airway, passes BELOW cords without attempt by patient to eject out (silent aspiration) Pharyngeal- Thin Cup NT Pharyngeal -- Pharyngeal- Thin Straw -- Pharyngeal -- Pharyngeal- Puree Reduced anterior laryngeal mobility;Reduced laryngeal elevation;Pharyngeal residue - valleculae;Pharyngeal residue - pyriform Pharyngeal -- Pharyngeal- Mechanical Soft Reduced anterior laryngeal mobility;Reduced laryngeal elevation;Pharyngeal residue - valleculae;Pharyngeal residue - pyriform  Pharyngeal -- Pharyngeal- Regular -- Pharyngeal -- Pharyngeal- Multi-consistency -- Pharyngeal -- Pharyngeal- Pill -- Pharyngeal -- Pharyngeal Comment --  CHL IP CERVICAL ESOPHAGEAL PHASE 07/20/2019 Cervical Esophageal Phase Impaired Pudding Teaspoon -- Pudding Cup -- Honey Teaspoon -- Honey Cup -- Nectar Teaspoon -- Nectar Cup -- Nectar Straw -- Thin Teaspoon -- Thin Cup -- Thin Straw -- Puree -- Mechanical Soft -- Regular -- Multi-consistency -- Pill -- Cervical Esophageal Comment -- Shanika I. Hardin Negus, [place], [place] Office number [phone] Pager [phone] Horton Marshall 07/30/2019, 1:33 PM                Scheduled Meds: . bacitracin  1 application Topical BID  . bethanechol  5 mg Oral TID  . chlorhexidine gluconate (MEDLINE KIT)  15 mL Mouth Rinse BID  . Chlorhexidine Gluconate Cloth  6 each Topical Daily  . cloNIDine  0.2 mg Transdermal Weekly  . feeding supplement (PRO-STAT SUGAR FREE 64)  30 mL Per Tube BID  . folic acid  1 mg Oral Daily  . guaiFENesin  400 mg Oral Q6H  . heparin injection (subcutaneous)  5,000 Units Subcutaneous Q8H  . insulin aspart  0-15 Units Subcutaneous Q4H  . losartan  25 mg Oral Daily  . mouth rinse  15 mL Mouth Rinse 10 times per day  . methadone  5 mg Oral  Q12H  . metoprolol tartrate  75 mg Oral BID  . multivitamin  15 mL Oral Daily  . nicotine  14 mg Transdermal Daily  . pantoprazole (PROTONIX) IV  40 mg Intravenous Q24H  . polyethylene glycol  17 g Oral Daily  . QUEtiapine  200 mg Oral BID  . sodium chloride flush  10-40 mL Intracatheter Q12H  . thiamine injection  100 mg Intravenous Daily   Continuous Infusions: . sodium chloride Stopped (07/17/19 2053)  . feeding supplement (OSMOLITE 1.2 CAL) Stopped (07/29/19 0906)  . sodium chloride       LOS: 49 days    Time spent: 25 minutes spent in the coordination of care today.    Jonnie Finner, DO [facility]  If 7PM-7AM, please contact night-coverage  [email or web address] 07/31/2019, 12:22 PM

## 2019-07-31 NOTE — Progress Notes (Signed)
PULMONARY / CRITICAL CARE MEDICINE  NAME:  William [place]., MRN:  [id number], DOB:  11-10-1960, LOS: 70 ADMISSION DATE:  06/12/2019, CONSULTATION DATE:  06/12/2019 REFERRING MD:  Alinda Sierras, CHIEF COMPLAINT:  Shortness of breath, sore throat, and neck swelling  BRIEF HISTORY:    59 yo male smoker with hx of ETOH had admission in November 2020 for pancreatitis and hyponatremia presented with dyspnea and neck swelling from cellulitis with acute airway compromise resulting in PEA requiring emergent tracheostomy.  Found to have H influenza bacteremia.  SIGNIFICANT PAST MEDICAL HISTORY   ETOH, Pneumonia, COPD, Rt lung empyema, Hep C, Pancreatitis  SIGNIFICANT EVENTS:  1/04 Lost airway and PEA arrest on, requiring emergent tracheostomy and ROSC obtained 1/05 Blood cultures 4/4 growing H.Influenza , narrowed to Unasyn 1/5 - 1/14 Continued on antibiotics without any significant improvement  1/14 New R foot swelling for which MRI obtained due to concerns for seeding cellulitis from neck 1/15 MRI with synovitis and subcutaneous edema suggestive of cellulitis; orthopedics consulted for right ankle aspiration - aspirate with positive crystals suggestive of gout - patient started on NSAIDs and steroids 1/16 Started on dilaudid, D/C fentanyl  1/17 Patient agitated early AM, q2h prn versed prescribed 1/18 Hb 5.2, transfused 2u pRBC; started methadone  1/19 Klonopin increased to tid and metoprolol added for SVTs 1/20 precedex gtt discontinued overnight for concerns of bradycardia  1/21 clonidine taper, 24hr trach collar 1/22 continuing trach collar 1/23 last day of unasyn for cellulitis, steroid taper and colchicine for gout, continuing trach collar  1/24 72 hours of trach collar 1/25 changed to 4 cuffless 1/26 sedation cut back 1/30 returned to ICU for hypercarbic respiratory failure and encephalopathy 2/2 inadvertent high dose of methadone and resultant resp faillure. Trach positioned in Cric so  underwent re-do 2/06 liberated from vent. Has been TC for 72 hours. 2/09 off precedex, transfer to progressive care 2/11 failed MBS 2/12 Required tracheal suctioning, decided to hold off on tracheostomy change over the weekend 2/22: Change to size 4 cuffless tracheostomy  CULTURES:  Influenza A / B 1/4 >> Negative Sars Coronavirus 1/4 >> Negative Blood cultures 1/4 >> 4/4 Positive for H. Influenza  Urine culture 1/4 >> no growth BCx2 1/6 >> negative  Tracheal aspirate 1/13 >> few klebsiella pneumoniae  R ankle aspirate 1/16 >> monosodium urate crystals, no organisms on gram stain  Sputum culture 1/27 >> klebsiella >> S-cefazolin, cefepime  ANTIBIOTICS:  Bactrim 1/4 Azithromycin 1/4  Vancomycin 1/4 Zosyn  1/4 one dose Clindamycin 1/4 one dose Unasyn 1/5 >> 1/23  Ceftriaxone 1/29 (for klebsiella) >>   LINES/TUBES:  Tracheostomy 1/4 >> 1/30 (redo), 1/30 >> PICC line double lumen - placed 1/15  CONSULTANTS:  Infectious Disease  ENT  Orthopedic surgery  SUBJECTIVE:  Doing better  OBJECTIVE:       CONSTITUTIONAL: Blood Pressure (Abnormal) 142/96 (BP Location: Left Arm)   Pulse 78   Temperature 99.1 F (37.3 C) (Oral)   Respiration 19   Height 5\' 8"  (1.727 m)   Weight 70.2 kg   Oxygen Saturation 97%   Body Mass Index 23.54 kg/m   I/O last 3 completed shifts: In: 20 [I.V.:20] Out: 2325 [Urine:2325]   FiO2 (%):  [21 %] 21 %   PHYSICAL EXAM: General: 59 year old male currently resting in bed he is in no acute distress HEENT normocephalic atraumatic no jugular venous distention.  The size 6 cuffed tracheostomy was removed without difficulty, stoma site unremarkable, did have mild bleeding after removal of  cuffed trach, new size 4 cuffless tracheostomy was replaced, his Passy-Muir valve was then replaced, his phonation quality was excellent.  Cough was strong. 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 influenzae bacteremiaProlonged hospital course with respiratory failure.  Requiring tracheostomy tube.  Tracheostomy dependence Chronic respiratory failure Dysphagia  Discussion  Had been having increased tracheal secretions last week, we escalated diuresis for this.  He had already completed a 5-day course of doxycycline for possible tracheobronchitis.  As of now he remains 3 L positive according to his intake output flowsheet His current oxygen requirements are currently 21% aerosol trach collar He has been tolerating PMV, tolerating pured solids without sign or symptom of aspiration but did have throat clearing with ice chips and thin liquids, a modified barium swallow was completed on 2/21 this showed improved swallowing function, but still had some moderate oropharyngeal dysphagia it was recommended that he continue a dysphagia 1 pured diet with honey thick liquids at this point with ongoing speech therapy Plan Continue to encourage Passy-Muir valve Aspiration and reflux precautions We will see how he does over the next couple of days, may even be able to reattempt capping trials  All other care per primary service  Erick Colace ACNP-BC [place] Pager # [phone] OR # [phone] if  no answer

## 2019-07-31 NOTE — Progress Notes (Signed)
Nutrition Follow-up  DOCUMENTATION CODES:   Non-severe (moderate) malnutrition in context of chronic illness  INTERVENTION:  Provide Ensure Enlive po QID (thickened to honey thick consistency), each supplement provides 350 kcal and 20 grams of protein.  Provide Magic cup TID with meals, each supplement provides 290 kcal and 9 grams of protein.  Encourage adequate PO intake.   NUTRITION DIAGNOSIS:   Moderate Malnutrition related to chronic illness(COPD, alcoholism) as evidenced by mild fat depletion, moderate fat depletion, mild muscle depletion; ongoing  GOAL:   Patient will meet greater than or equal to 90% of their needs; progressing  MONITOR:   PO intake, Supplement acceptance, Diet advancement, Skin, Weight trends, Labs, I & O's  REASON FOR ASSESSMENT:   Ventilator    ASSESSMENT:   59 yo male admitted with SOB, neck swelling, sore throat. Found to have H. flu in blood. S/P PEA arrest requiring emergent tracheostomy 1/4. PMH includes alcohol abuse, dysphagia, chronic hepatitis C, heavy smoker, COPD, poor dentition.  Pt continues on trach collar. Cortrak NGT pulled out 2/19. Pt underwent MBS yesterday. Pt with improvement in swallow function with moderate oropharyngeal dysphagia. Diet advanced to a dysphagia 1 diet with honey thick liquids. Meal completion 10% at breakfast this AM. RD to order nutritional supplements to aid in caloric and protein needs. If po intake does not improve, may need consideration of NGT replacement for enteral nutrition for adequate nutrition. RD to continue to monitor.   Labs and medications reviewed.   Diet Order:   Diet Order            DIET - DYS 1 Room service appropriate? No; Fluid consistency: Honey Thick  Diet effective now              EDUCATION NEEDS:   Not appropriate for education at this time  Skin:  Skin Assessment: Skin Integrity Issues: Skin Integrity Issues:: Stage III Stage II: N/A Stage III: neck  Last BM:   2/21  Height:   Ht Readings from Last 1 Encounters:  06/12/19 5\' 8"  (1.727 m)    Weight:   Wt Readings from Last 1 Encounters:  07/21/19 70.2 kg    Ideal Body Weight:  70 kg  BMI:  Body mass index is 23.54 kg/m.  Estimated Nutritional Needs:   Kcal:  1900-2100  Protein:  100-115 gm  Fluid:  >/= 1.9 L   Corrin Parker, MS, RD, LDN RD pager number/after hours weekend pager number on Amion.

## 2019-07-31 NOTE — Progress Notes (Signed)
  Speech Language Pathology Treatment: Dysphagia  Patient Details Name: [name]. MRN: [id number] DOB: April 09, 1961 Today's Date: 07/31/2019 Time: 1600-1610 SLP Time Calculation (min) (ACUTE ONLY): 10 min  Assessment / Plan / Recommendation Clinical Impression  Pt now on oral diet after yesterday's MBS.  Trach downsized back to #4 cuffless today; PMV in place upon entry and pt communicating with low volume but good quality voice.  He consumed several sips of honey-thick liquid and bites of Magic Cup, self-feeding, with great enthusiasm and motivation.  There were no overt s/s of difficulty with current conservative diet.  Only occasional verbal cues were required for rate, positioning.  Recommend continuing dysphagia 1, honey thick liquids for now. SLP will continue to follow for dysphagia/voice.   HPI HPI: 59 year old man with a history of alcohol use disorder, tobacco use disorder, HTN, and recent admission on 04/10/2019 for pancreatitis and hyponatremia who presents for evaluation of shortness of breath and neck swelling with loss of airway and PEA arrest requiring emergent tracheostomy. Trach changed from 4 cuffless back to Shiley 6 cuffed on 1/30 after RR event, and transferred back to ICU. Chest x-ray of 2/19: Retrocardiac opacity may be atelectasis or pneumonia.  MBS 2/21- started on dys1/honey-thick liquids; trach downsized back to #4 cuffless on 2/22.       SLP Plan  Continue with current plan of care       Recommendations  Diet recommendations: Dysphagia 1 (puree);Honey-thick liquid Liquids provided via: Cup Medication Administration: Crushed with puree Supervision: Full supervision/cueing for compensatory strategies Compensations: Small sips/bites;Slow rate;Follow solids with liquid;Effortful swallow      Patient may use Passy-Muir Speech Valve: During all waking hours (remove during sleep)         Oral Care Recommendations: Oral care BID SLP Visit Diagnosis:  Dysphagia, oropharyngeal phase (R13.12) Plan: Continue with current plan of care       GO                [name] 07/31/2019, 5:07 PM  [name] [name], [place] Office number [phone] Pager [phone]

## 2019-07-31 NOTE — Procedures (Signed)
.   The old  #6 cuffed trach was carefully removed. the tracheostomy site appeared: unremarkable. A new # 4  Cuffless  trach was easily placed in the tracheostomy stoma and secured with velcro trach ties. The tracheostomy was patent, good color change observed via EZ-CAP, and the patient was easily able to voice with finger occlusion and tolerated the procedure well with no immediate complications.    Erick Colace ACNP-BC [place] Pager # [phone] OR # [phone] if no answer

## 2019-08-01 LAB — COMPREHENSIVE METABOLIC PANEL
ALT: 50 U/L — ABNORMAL HIGH (ref 0–44)
AST: 24 U/L (ref 15–41)
Albumin: 3.2 g/dL — ABNORMAL LOW (ref 3.5–5.0)
Alkaline Phosphatase: 154 U/L — ABNORMAL HIGH (ref 38–126)
Anion gap: 14 (ref 5–15)
BUN: 37 mg/dL — ABNORMAL HIGH (ref 6–20)
CO2: 21 mmol/L — ABNORMAL LOW (ref 22–32)
Calcium: 9.8 mg/dL (ref 8.9–10.3)
Chloride: 98 mmol/L (ref 98–111)
Creatinine, Ser: 1.27 mg/dL — ABNORMAL HIGH (ref 0.61–1.24)
GFR calc Af Amer: >60 mL/min (ref >60)
GFR calc non Af Amer: >60 mL/min (ref >60)
Glucose, Bld: 116 mg/dL — ABNORMAL HIGH (ref 70–99)
Potassium: 3.7 mmol/L (ref 3.5–5.1)
Sodium: 133 mmol/L — ABNORMAL LOW (ref 135–145)
Total Bilirubin: 0.9 mg/dL (ref 0.3–1.2)
Total Protein: 7.3 g/dL (ref 6.5–8.1)

## 2019-08-01 LAB — CBC WITH DIFFERENTIAL/PLATELET
Abs Immature Granulocytes: 0.04 10*3/uL (ref 0.00–0.07)
Basophils Absolute: 0.1 10*3/uL (ref 0.0–0.1)
Basophils Relative: 1 %
Eosinophils Absolute: 0.3 10*3/uL (ref 0.0–0.5)
Eosinophils Relative: 3 %
HCT: 39.1 % (ref 39.0–52.0)
Hemoglobin: 12.3 g/dL — ABNORMAL LOW (ref 13.0–17.0)
Immature Granulocytes: 0 %
Lymphocytes Relative: 23 %
Lymphs Abs: 2.2 10*3/uL (ref 0.7–4.0)
MCH: 25.6 pg — ABNORMAL LOW (ref 26.0–34.0)
MCHC: 31.5 g/dL (ref 30.0–36.0)
MCV: 81.5 fL (ref 80.0–100.0)
Monocytes Absolute: 0.6 10*3/uL (ref 0.1–1.0)
Monocytes Relative: 6 %
Neutro Abs: 6.3 10*3/uL (ref 1.7–7.7)
Neutrophils Relative %: 67 %
Platelets: 447 10*3/uL — ABNORMAL HIGH (ref 150–400)
RBC: 4.8 MIL/uL (ref 4.22–5.81)
RDW: 18.2 % — ABNORMAL HIGH (ref 11.5–15.5)
WBC: 9.4 10*3/uL (ref 4.0–10.5)
nRBC: 0 % (ref 0.0–0.2)

## 2019-08-01 LAB — GLUCOSE, CAPILLARY
Glucose-Capillary: 101 mg/dL — ABNORMAL HIGH (ref 70–99)
Glucose-Capillary: 119 mg/dL — ABNORMAL HIGH (ref 70–99)
Glucose-Capillary: 107 mg/dL — ABNORMAL HIGH (ref 70–99)

## 2019-08-01 LAB — MAGNESIUM: Magnesium: 1.8 mg/dL (ref 1.7–2.4)

## 2019-08-01 MED ORDER — METHADONE HCL 5 MG PO TABS
2.5000 mg | ORAL_TABLET | Freq: Two times a day (BID) | ORAL | Status: DC
  Administered 2019-08-01 – 2019-08-04 (×6): 2.5 mg via ORAL
  Filled 2019-08-01 (×6): qty 1

## 2019-08-01 MED ORDER — THIAMINE HCL 100 MG PO TABS
100.0000 mg | ORAL_TABLET | Freq: Every day | ORAL | Status: DC
  Administered 2019-08-01 – 2019-08-04 (×4): 100 mg via ORAL
  Filled 2019-08-01 (×4): qty 1

## 2019-08-01 MED ORDER — PANTOPRAZOLE SODIUM 40 MG PO TBEC
40.0000 mg | DELAYED_RELEASE_TABLET | Freq: Every day | ORAL | Status: DC
  Administered 2019-08-01 – 2019-08-04 (×4): 40 mg via ORAL
  Filled 2019-08-01 (×4): qty 1

## 2019-08-01 NOTE — Progress Notes (Signed)
PULMONARY / CRITICAL CARE MEDICINE  NAME:  [name]., MRN:  [id number], DOB:  03-20-1961, LOS: 22 ADMISSION DATE:  06/12/2019, CONSULTATION DATE:  06/12/2019 REFERRING MD:  Alinda Sierras, CHIEF COMPLAINT:  Shortness of breath, sore throat, and neck swelling  BRIEF HISTORY:    59 yo male smoker with hx of ETOH had admission in November 2020 for pancreatitis and hyponatremia presented with dyspnea and neck swelling from cellulitis with acute airway compromise resulting in PEA requiring emergent tracheostomy.  Found to have H influenza bacteremia.  SIGNIFICANT PAST MEDICAL HISTORY   ETOH, Pneumonia, COPD, Rt lung empyema, Hep C, Pancreatitis  SIGNIFICANT EVENTS:  1/04 Lost airway and PEA arrest on, requiring emergent tracheostomy and ROSC obtained 1/05 Blood cultures 4/4 growing H.Influenza , narrowed to Unasyn 1/5 - 1/14 Continued on antibiotics without any significant improvement  1/14 New R foot swelling for which MRI obtained due to concerns for seeding cellulitis from neck 1/15 MRI with synovitis and subcutaneous edema suggestive of cellulitis; orthopedics consulted for right ankle aspiration - aspirate with positive crystals suggestive of gout - patient started on NSAIDs and steroids 1/16 Started on dilaudid, D/C fentanyl  1/17 Patient agitated early AM, q2h prn versed prescribed 1/18 Hb 5.2, transfused 2u pRBC; started methadone  1/19 Klonopin increased to tid and metoprolol added for SVTs 1/20 precedex gtt discontinued overnight for concerns of bradycardia  1/21 clonidine taper, 24hr trach collar 1/22 continuing trach collar 1/23 last day of unasyn for cellulitis, steroid taper and colchicine for gout, continuing trach collar  1/24 72 hours of trach collar 1/25 changed to 4 cuffless 1/26 sedation cut back 1/30 returned to ICU for hypercarbic respiratory failure and encephalopathy 2/2 inadvertent high dose of methadone and resultant resp faillure. Trach positioned in Cric so  underwent re-do 2/06 liberated from vent. Has been TC for 72 hours. 2/09 off precedex, transfer to progressive care 2/11 failed MBS 2/12 Required tracheal suctioning, decided to hold off on tracheostomy change over the weekend 2/22: Change to size 4 cuffless tracheostomy 2/23: Starting capping trials CULTURES:  Influenza A / B 1/4 >> Negative Sars Coronavirus 1/4 >> Negative Blood cultures 1/4 >> 4/4 Positive for H. Influenza  Urine culture 1/4 >> no growth BCx2 1/6 >> negative  Tracheal aspirate 1/13 >> few klebsiella pneumoniae  R ankle aspirate 1/16 >> monosodium urate crystals, no organisms on gram stain  Sputum culture 1/27 >> klebsiella >> S-cefazolin, cefepime  ANTIBIOTICS:  Bactrim 1/4 Azithromycin 1/4  Vancomycin 1/4 Zosyn  1/4 one dose Clindamycin 1/4 one dose Unasyn 1/5 >> 1/23  Ceftriaxone 1/29 (for klebsiella) >>   LINES/TUBES:  Tracheostomy 1/4 >> 1/30 (redo), 1/30 >> PICC line double lumen - placed 1/15  CONSULTANTS:  Infectious Disease  ENT  Orthopedic surgery  SUBJECTIVE:  No distress, doing well with PMV  OBJECTIVE:       CONSTITUTIONAL: Blood Pressure 115/71 (BP Location: Left Arm)   Pulse 90   Temperature 98.5 F (36.9 C) (Oral)   Respiration 17   Height 5\' 8"  (1.727 m)   Weight 70.2 kg   Oxygen Saturation 97%   Body Mass Index 23.54 kg/m   I/O last 3 completed shifts: In: 76 [P.O.:720; I.V.:10] Out: 300 [Urine:300]   FiO2 (%):  [21 %] 21 %   PHYSICAL EXAM: General Pleasant 59 year old white male is resting in bed currently in no acute distress HEENT normocephalic atraumatic no jugular venous distention.  Has a size 4 cuffless tracheostomy in place Pulmonary: Some scattered  rhonchi that clear with cough otherwise no accessory use, as mentioned below tolerates tracheal occlusion without difficulty excellent phonation quality. 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 influenzae bacteremiaProlonged hospital course with respiratory failure.  Requiring tracheostomy tube.  Tracheostomy dependence Chronic respiratory failure Dysphagia  Discussion  I downsized him to a 4 cuffless yesterday on 2/22.  He is breathing comfortably.  Has not required suction, his cough mechanics and voice quality are strong.  He does have some raspy phonation while eating, requiring clearing of throat but from a breathing standpoint he appears comfortable he is currently on room air.  Bedside assessment of trach occlusion shows no difficulty with breathing, no distress, excellent phonation.  I think he may be close to being ready for decannulation, I certainly do not think he is a good candidate for long-term tracheostomy Plan Initiate trach capping trials Continuous pulse oximetry  reassess in a.m., if no issues I think we should proceed with decannulation   All other care per primary service  Erick Colace ACNP-BC [place] Pager # [phone] OR # [phone] if no answer

## 2019-08-01 NOTE — Progress Notes (Signed)
[name]  PROGRESS NOTE    [name].  NUU:[id number] DOB: 1961/04/08 DOA: 06/12/2019 PCP: Alvester Chou, NP   Brief Narrative:   59 y.o.malewith a history of alcohol/tobacco abuse, COPD, hyponatremia,empyema in June 2020 and admission for acute pancreatitis in November 3474 complicated by respiratory failure requiring tracheostomy at that time presented on 06/12/2019 with neck swelling found to have cellulitis with acute airway compromise resulting in PEA arrest requiring emergent tracheostomy.Neck CTA-thickening epiglottis with pharyngeal and supraglottic wall edema,also multifocal pneumonia by CT-recieved empiric antibiotics, racemic epinephrine with Decadron per ENT recommendation. Blood cultures grew H.Influenza 4/4 , abx changed to Unasyn 1/5 >1/14 without any significant improvement. New right foot swelling noted on 1/14- MRI obtained to rule out infectious emboli-it showed synovitis and subcutaneous edema suggestive of cellulitis; orthopedics consulted for right ankle aspiration - aspirate with positive crystals suggestive ofgout- patient started on NSAIDs and steroids. Pain meds titrated ( Started on dilaudid, methadone,D/Cedfentanyl). Patient completed Unasyn course on 1/23.  HC complicated by delirium/agitation requiring versed/klonapin/precedex gtt while in ICU, anemiarequiring transfusion, elevated LFTs,and paroxysmal SVTs.On 1/17: Patient agitated early AM, q2h prn versed prescribed. Hb 5.2, transfused 2u pRBC; started methadone . On 1/19: Klonopin increased to tid and metoprolol added for SVTs but patient had subsequent bradycardia, clonidine tapered,precedex gtt discontinued . 1/21-1/24--placed on trach collar, then changed to 4 cuffless and sedation cut back slowly. However, on 1/30 patient returned to ICU for hypercarbic respiratory failure and encephalopathy--finally liberated from ventilator on 2/06- takenoff precedex and transferred to progressive care/ [facility] on 2/09. PCCM  continuingtracheostomy care, remains on trach collar. 2/11 Failed swallow study, PICC line removed 2/12. Clonazepam/methadone dosing being tapered down.Per wife,patient has history of dysphagia, suspected to have esophageal stenosis and was supposed to follow-up with GI for "stretching" as outpatient. Losartan/diltiazem discontinued2/14and patient given 250 normal saline bolus for hypotension.Seen by PCCM, ID, ENT and ortho during [place].  08/01/19:Up and interactive this AM. Eating breakfast. Tolerating. Some secretion from trach. PCCM to start capping trials. He is making progress.     Assessment & Plan:   Principal Problem:   Neck infection Active Problems:   Alcohol abuse   Poor dentition   Septic shock (HCC)   Laryngeal edema   Multifocal pneumonia   Airway compromise   Chronic hepatitis C without hepatic coma (HCC)   Acute respiratory failure with hypoxemia (HCC)   Cardiac arrest, cause unspecified (HCC)   Haemophilis influenzae bacteremia    Malnutrition of moderate degree   Atelectasis   Tracheostomy care ([place])   Acute metabolic encephalopathy   Hypercarbia   Tracheostomy status (HCC)   Pressure injury of skin  Acute hypoxic/hypercarbic respiratory failure, present on admission Neck cellulitus/swelling, tracheitis Multifocal PNA H. influenza bacteremia - now tracheostomy dependent - Patient presented with neck swelling/infection with tracheitis and airway compromise. - He received Decadron/racemic epinephrine per ENT recommendations and was intubated.  - Ended up with tracheostomy for failure to liberate from the vent. - Completed IV unasyn on 1/23  - PCCM onboard, appreciate assistance, continues to have excess secretions, defer to PCCM. - continue doxycycline - 07/27/19: continue doxy through tomorrow; wean O2, trach as able - 07/28/19: respirations improving, getting IV lasix for secretions, wean trach as able, doxy stops  today.     - 08/01/19: WBC normalized; no fevers, monitor  Acute metabolic encephalopathy - In the setting of above.  - He also has history of alcohol abuse/opiate dependence.  - Continue supportive care.  - Delirium precautions. -  Methadone 5 mg BID; Methadone being tapered down while titrating clonidine. - return seroquel dose to 232m BID d/t agitation ON  - also has Seroquel/Haldol additional dosing available as needed for agitation.     - 08/01/19: Mentation is much better, he is more cooperative and interacting more appropriately; continue current regimen  Dysphagia, history of esophageal stenosis  - Speech therapy following along - continue NPO for now     - 07/31/19: he has been cleared for diet and is tolerating  Hypertension with hypotensive episode on 2/14 - Received IV fluid bolus. - losartan, diltiazem discontinued - remains on metoprolol and clonidine. - BP up and down today; can consider low dose losartan in AM if BP continues this trend - 07/27/19: BP is ok today - 07/28/19: ok to resume losartan; also being diuresed with pulm, watch renal function - 07/29/19: cortrak is out; he's getting lasix IV, will switch clonidine to patch, will schedule IV metoprolol     - 07/31/19: all BP meds switched back to PO w/ exception of clonidine.     - 08/01/19:  BP is acceptable today, continue current Tx.   COPD/tobacco use - Continue nicotine patch.   - albuterol as needed.  PSVT - Remains on beta-blockers.  - Off diltiazem.  - Patient did have PEA cardiac arrest on presentation requiring emergent tracheostomy.     - 07/31/19: remains on metoprolol, HR improved  Alcohol abuse - Out of window for alcohol withdrawal.  - Continue multivitamin/thiamine.  Moderate protein calorie malnutrition with hypoalbuminemia - Due to prolonged hospitalization and n.p.o. status/confusion.   - Albumin level 2.9-3.1.  - On tube feeds currently. - SLP onboard - 07/29/19: pulled cortrak, have talked with SLP about schedule for swallow eval, they will review today; otherwise, he will need to have NGT placed     - 08/01/19: tolerating diet; no issues noted, making progress  DVT prophylaxis: heparin Code Status: FULL Family Communication: None at bedside   Disposition Plan: Right now plan is for SNF; continue working w/ PT to see if this improves  Consultants:   PCCM  Procedures:   Intubation/extubation  Trach   ROS:  Denies CP, N, V, ab pain, dyspnea . Remainder 10-pt ROS is negative for all not previously mentioned.  Subjective: "I'm eating this good. I'm trying."  Objective: Vitals:   08/01/19 0414 08/01/19 0500 08/01/19 0728 08/01/19 0815  BP:      Pulse:  75  90  Resp:  19  17  Temp: 97.8 F (36.6 C)  98.5 F (36.9 C)   TempSrc: Axillary  Oral   SpO2:  96%  97%  Weight:      Height:        Intake/Output Summary (Last 24 hours) at 08/01/2019 1041 Last data filed at 07/31/2019 2225 Gross per 24 hour  Intake 360 ml  Output 0 ml  Net 360 ml   Filed Weights   07/14/19 0500 07/18/19 0427 07/21/19 0342  Weight: 69.7 kg 70.5 kg 70.2 kg    Examination:  General: 59y.o. male resting in bed in NAD Cardiovascular: RRR, +S1, S2, no m/g/r Respiratory: CTABL, no w/r/r, normal WOB, trach'd w/ secretion GI: BS+, NDNT, soft MSK: No e/c/c Neuro: Alert to name, follows commands Psyc: Appropriate interaction and affect, calm/cooperative   Data Reviewed: I have personally reviewed following labs and imaging studies.  CBC: Recent Labs  Lab 07/27/19 0228 07/29/19 0254 07/30/19 1315 07/31/19 0135 08/01/19 0134  WBC 8.6 27.0* 10.8*  10.7* 9.4  NEUTROABS 5.3 25.7* 8.3* 7.7 6.3  HGB 11.0* 12.7* 13.7 13.2 12.3*  HCT 36.1* 39.8 43.5 41.9 39.1  MCV 85.1 81.7 81.6 82.0 81.5  PLT 413* 450* 493* 453* 992*   Basic Metabolic Panel: Recent  Labs  Lab 07/26/19 0950 07/26/19 0950 07/27/19 0228 07/29/19 0254 07/30/19 1315 07/31/19 0135 08/01/19 0134  NA 139   < > 139 138 140 141 133*  K 4.5   < > 4.2 4.5 3.8 3.5 3.7  CL 99   < > 102 101 101 101 98  CO2 25   < > 26 24 24 23  21*  GLUCOSE 119*   < > 121* 126* 121* 123* 116*  BUN 25*   < > 28* 21* 29* 33* 37*  CREATININE 0.61   < > 0.82 0.83 0.93 1.03 1.27*  CALCIUM 10.2   < > 9.9 10.0 10.4* 10.2 9.8  MG  --   --  1.9 1.8  --  1.9 1.8  PHOS 5.0*  --   --   --   --   --   --    < > = values in this interval not displayed.   GFR: Estimated Creatinine Clearance: 62.1 mL/min (A) (by C-G formula based on SCr of 1.27 mg/dL (H)). Liver Function Tests: Recent Labs  Lab 07/27/19 0228 07/29/19 0254 07/30/19 1315 07/31/19 0135 08/01/19 0134  AST 41 36 35 28 24  ALT 99* 83* 74* 65* 50*  ALKPHOS 199* 195* 190* 168* 154*  BILITOT 0.7 1.2 1.0 0.9 0.9  PROT 7.5 7.9 8.7* 8.2* 7.3  ALBUMIN 3.0* 3.3* 3.6 3.2* 3.2*   No results for input(s): LIPASE, AMYLASE in the last 168 hours. No results for input(s): AMMONIA in the last 168 hours. Coagulation Profile: No results for input(s): INR, PROTIME in the last 168 hours. Cardiac Enzymes: No results for input(s): CKTOTAL, CKMB, CKMBINDEX, TROPONINI in the last 168 hours. BNP (last 3 results) No results for input(s): PROBNP in the last 8760 hours. HbA1C: No results for input(s): HGBA1C in the last 72 hours. CBG: Recent Labs  Lab 07/31/19 1620 07/31/19 1953 08/01/19 0210 08/01/19 0411 08/01/19 0726  GLUCAP 104* 103* 101* 119* 107*   Lipid Profile: No results for input(s): CHOL, HDL, LDLCALC, TRIG, CHOLHDL, LDLDIRECT in the last 72 hours. Thyroid Function Tests: No results for input(s): TSH, T4TOTAL, FREET4, T3FREE, THYROIDAB in the last 72 hours. Anemia Panel: No results for input(s): VITAMINB12, FOLATE, FERRITIN, TIBC, IRON, RETICCTPCT in the last 72 hours. Sepsis Labs: No results for input(s): PROCALCITON, LATICACIDVEN in  the last 168 hours.  No results found for this or any previous visit (from the past 240 hour(s)).    Radiology Studies: DG Swallowing Func-Speech Pathology  Result Date: 07/30/2019 Objective Swallowing Evaluation: Type of Study: MBS-Modified Barium Swallow Study  Patient Details Name: Hasan Douse. MRN: [id number] Date of Birth: 30-Mar-1961 Today's Date: 07/30/2019 Time: SLP Start Time (ACUTE ONLY): 1107 -SLP Stop Time (ACUTE ONLY): 1120 SLP Time Calculation (min) (ACUTE ONLY): 13 min Past Medical History: Past Medical History: Diagnosis Date  Alcohol abuse   Alcoholic ([place])   Back pain   CAP (community acquired pneumonia) 11/26/2018  COPD (chronic obstructive pulmonary disease) (HCC)   Empyema of right pleural space ([place]) 11/26/2018  Heavy smoker   Hep C w/ coma, chronic (HCC)   Loose, teeth   Pancreatitis   Poor dentition   Tobacco abuse  Past Surgical History: Past Surgical History: Procedure Laterality  Date  ARTERY REPAIR  2013  neck  BIOPSY  02/19/2019  Procedure: BIOPSY;  Surgeon: Ronnette Juniper, MD;  Location: [facility] ENDOSCOPY;  Service: Gastroenterology;;  ESOPHAGOGASTRODUODENOSCOPY (EGD) WITH PROPOFOL N/A 02/19/2019  Procedure: ESOPHAGOGASTRODUODENOSCOPY (EGD) WITH PROPOFOL;  Surgeon: Ronnette Juniper, MD;  Location: [place];  Service: Gastroenterology;  Laterality: N/A;  FOREIGN BODY REMOVAL  02/19/2019  Procedure: FOREIGN BODY REMOVAL;  Surgeon: Ronnette Juniper, MD;  Location: [facility] ENDOSCOPY;  Service: Gastroenterology;;  KNEE SURGERY  2013  rt  MICROLARYNGOSCOPY N/A 07/31/2014  Procedure: MICROLARYNGOSCOPY WITH BIOPSY ;  Surgeon: Rozetta Nunnery, MD;  Location: [place];  Service: ENT;  Laterality: N/A;  NECK SURGERY  2013  cervical fusion-  VASCULAR SURGERY    VIDEO ASSISTED THORACOSCOPY (VATS)/EMPYEMA Right 11/26/2018  Procedure: VIDEO ASSISTED THORACOSCOPY (VATS)/EMPYEMA;  Surgeon: Rexene Alberts, MD;  Location: [place];  Service: Thoracic;  Laterality: Right;  VIDEO  BRONCHOSCOPY  11/26/2018  Procedure: Video Bronchoscopy;  Surgeon: Rexene Alberts, MD;  Location: [place];  Service: Thoracic;; HPI: 59 year old man with a history of alcohol use disorder, tobacco use disorder, HTN, and recent admission on 04/10/2019 for pancreatitis and hyponatremia who presents for evaluation of shortness of breath and neck swelling with loss of airway and PEA arrest requiring emergent tracheostomy. Trach changed from 4 cuffless back to [place] 6 cuffed on 1/30 after RR event, and transferred back to ICU. Chest x-ray of 2/19: Retrocardiac opacity may be atelectasis or pneumonia  Subjective: Pt was alert and cooperative Assessment / Plan / Recommendation CHL IP CLINICAL IMPRESSIONS 07/30/2019 Clinical Impression Pt has demonstrated some improvement in swallow function as evidenced by improved epiglottic inversion, reduced pharyngeal residue, and reduced laryngeal invasion. He presents with moderate oropharyngeal dysphagia characterized by impaired bolus control, a pharyngeal delay, reduced hyolaryngeal elevation, and reduced anterior laryngeal movement. He demonstrated vallecular residue across consistencies secondary to reduced epiglottic inversion; amount of residue increased with bolus size and viscosity. Epiglottic inversion was complete with more viscous boluses such as honey thick liquids and puree. The swallow was often triggered with head of the bolus at the level of pyriform sinuses and penetration (PAS 2,3) and aspiration (PAS 8) were noted. Pharyngeal residue was reduced and epiglottic inversion improved with use of an effortful swallow; however, laryngeal invasion was still demonstration. A dysphagia 1 (puree) diet with honey thick liquids is recommended at this time with full supervision for observance of swallowing precautions. SLP will continue to follow to assess diet tolerance and for dysphagia treatment.  SLP Visit Diagnosis Dysphagia, oropharyngeal phase (R13.12) Attention and  concentration deficit following -- Frontal lobe and executive function deficit following -- Impact on safety and function Mild aspiration risk;Moderate aspiration risk   CHL IP TREATMENT RECOMMENDATION 07/30/2019 Treatment Recommendations Therapy as outlined in treatment plan below   Prognosis 07/30/2019 Prognosis for Safe Diet Advancement Good Barriers to Reach Goals Cognitive deficits Barriers/Prognosis Comment -- CHL IP DIET RECOMMENDATION 07/30/2019 SLP Diet Recommendations Dysphagia 1 (Puree) solids;Honey thick liquids Liquid Administration via Cup;No straw Medication Administration Crushed with puree Compensations Small sips/bites;Slow rate;Follow solids with liquid;Effortful swallow Postural Changes Seated upright at 90 degrees   CHL IP OTHER RECOMMENDATIONS 07/30/2019 Recommended Consults -- Oral Care Recommendations Oral care BID Other Recommendations Place PMSV during PO intake;Order thickener from pharmacy   CHL IP FOLLOW UP RECOMMENDATIONS 07/30/2019 Follow up Recommendations Skilled Nursing facility   [facility] IP FREQUENCY AND DURATION 07/30/2019 Speech Therapy Frequency (ACUTE ONLY) min 2x/week Treatment Duration 2 weeks  CHL IP ORAL PHASE 07/30/2019 Oral Phase Impaired Oral - Pudding Teaspoon -- Oral - Pudding Cup -- Oral - Honey Teaspoon -- Oral - Honey Cup Lingual/palatal residue Oral - Nectar Teaspoon -- Oral - Nectar Cup Decreased bolus cohesion;Lingual/palatal residue Oral - Nectar Straw -- Oral - Thin Teaspoon -- Oral - Thin Cup Premature spillage Oral - Thin Straw -- Oral - Puree Lingual/palatal residue Oral - Mech Soft -- Oral - Regular -- Oral - Multi-Consistency -- Oral - Pill -- Oral Phase - Comment --  CHL IP PHARYNGEAL PHASE 07/30/2019 Pharyngeal Phase Impaired Pharyngeal- Pudding Teaspoon -- Pharyngeal -- Pharyngeal- Pudding Cup -- Pharyngeal -- Pharyngeal- Honey Teaspoon -- Pharyngeal -- Pharyngeal- Honey Cup Penetration/Aspiration during swallow;Pharyngeal residue - valleculae;Pharyngeal  residue - pyriform;Pharyngeal residue - posterior pharnyx Pharyngeal Material enters airway, remains ABOVE vocal cords then ejected out Pharyngeal- Nectar Teaspoon Penetration/Aspiration during swallow;Reduced anterior laryngeal mobility;Reduced laryngeal elevation;Pharyngeal residue - valleculae;Pharyngeal residue - pyriform;Reduced epiglottic inversion Pharyngeal Material enters airway, remains ABOVE vocal cords and not ejected out Pharyngeal- Nectar Cup -- Pharyngeal -- Pharyngeal- Nectar Straw -- Pharyngeal -- Pharyngeal- Thin Teaspoon Penetration/Aspiration during swallow;Reduced anterior laryngeal mobility;Reduced laryngeal elevation;Pharyngeal residue - valleculae;Pharyngeal residue - pyriform;Delayed swallow initiation-vallecula;Delayed swallow initiation-pyriform sinuses;Reduced epiglottic inversion Pharyngeal Material enters airway, passes BELOW cords without attempt by patient to eject out (silent aspiration) Pharyngeal- Thin Cup NT Pharyngeal -- Pharyngeal- Thin Straw -- Pharyngeal -- Pharyngeal- Puree Reduced anterior laryngeal mobility;Reduced laryngeal elevation;Pharyngeal residue - valleculae;Pharyngeal residue - pyriform Pharyngeal -- Pharyngeal- Mechanical Soft Reduced anterior laryngeal mobility;Reduced laryngeal elevation;Pharyngeal residue - valleculae;Pharyngeal residue - pyriform Pharyngeal -- Pharyngeal- Regular -- Pharyngeal -- Pharyngeal- Multi-consistency -- Pharyngeal -- Pharyngeal- Pill -- Pharyngeal -- Pharyngeal Comment --  CHL IP CERVICAL ESOPHAGEAL PHASE 07/20/2019 Cervical Esophageal Phase Impaired Pudding Teaspoon -- Pudding Cup -- Honey Teaspoon -- Honey Cup -- Nectar Teaspoon -- Nectar Cup -- Nectar Straw -- Thin Teaspoon -- Thin Cup -- Thin Straw -- Puree -- Mechanical Soft -- Regular -- Multi-consistency -- Pill -- Cervical Esophageal Comment -- Shanika I. Hardin Negus, [place], [place] Office number [phone] Pager [phone] Horton Marshall  07/30/2019, 1:33 PM                Scheduled Meds:  bacitracin  1 application Topical BID   bethanechol  5 mg Oral TID   chlorhexidine gluconate (MEDLINE KIT)  15 mL Mouth Rinse BID   Chlorhexidine Gluconate Cloth  6 each Topical Daily   cloNIDine  0.2 mg Transdermal Weekly   feeding supplement (ENSURE ENLIVE)  237 mL Oral 4x daily   folic acid  1 mg Oral Daily   guaiFENesin  400 mg Oral Q6H   heparin injection (subcutaneous)  5,000 Units Subcutaneous Q8H   insulin aspart  0-15 Units Subcutaneous Q4H   losartan  25 mg Oral Daily   mouth rinse  15 mL Mouth Rinse 10 times per day   methadone  5 mg Oral Q12H   metoprolol tartrate  75 mg Oral BID   multivitamin  15 mL Oral Daily   nicotine  14 mg Transdermal Daily   pantoprazole (PROTONIX) IV  40 mg Intravenous Q24H   polyethylene glycol  17 g Oral Daily   QUEtiapine  200 mg Oral BID   sodium chloride flush  10-40 mL Intracatheter Q12H   thiamine  100 mg Oral Daily   Continuous Infusions:  sodium chloride Stopped (07/17/19 2053)   sodium chloride       LOS: 50 days  Time spent: 25 minutes spent in the coordination of care today.    Jonnie Finner, DO [facility]  If 7PM-7AM, please contact night-coverage [email or web address] 08/01/2019, 10:41 AM

## 2019-08-01 NOTE — Progress Notes (Signed)
Physical Therapy Treatment Patient Details Name: [name]. MRN: [id number] DOB: January 24, 1961 Today's Date: 08/01/2019    History of Present Illness Pt is a 59 y.o. male admitted 06/12/19 for SOB and neck swelling. Admitted to ICU with loss of airway and PEA arrest requiring emergent trach; has been difficult to wean off sedation. 1/15 R foot MRI suggestive of cellulitis, s/p aspiration. 1/30 return to ICU for hypercarbic respiratory failure and encephalopathy. 2/06 on trach collar 72 hrs and vent liberated; 2/09 orders to leave ICU for progressive care  PMH includes recent admission for pancreatitis (04/10/19), ETOH and tobacco use, HTN.    PT Comments    Pt admitted with above diagnosis. Pt was able to ambulate with RW to bathroom with min guard assist of 2 for safety due to impulsivity and slightly unsteady gait. Improved today overall. Progressing slowly.  sats 96% on 28% trach collar.  Of note, pt was soiled with BM on arrival and did assist pt to clean prior to walk.  Pt currently with functional limitations due to balance and endurance deficits. Pt will benefit from skilled PT to increase their independence and safety with mobility to allow discharge to the venue listed below.     Follow Up Recommendations  SNF;Supervision/Assistance - 24 hour     Equipment Recommendations  Other (comment)(deferred to next venue)    Recommendations for Other Services OT consult     Precautions / Restrictions Precautions Precautions: Fall Precaution Comments: trach collar Restrictions Weight Bearing Restrictions: No    Mobility  Bed Mobility Overal bed mobility: Needs Assistance Bed Mobility: Supine to Sit;Sit to Supine Rolling: Min guard Sidelying to sit: Min guard       General bed mobility comments: +2 needed for line management and safety but not really physical assist.  Tactile and verbal cues only.   Transfers Overall transfer level: Needs assistance Equipment used: Rolling  walker (2 wheeled) Transfers: Sit to/from [facility] Sit to Stand: +2 safety/equipment;Min guard         General transfer comment: Cues for hand placement.  Had min guard A of 2 for line/lead safety and pt impulsive.  Stood to [employer] and PT assisted with cleaning pt bottom as he was in dried BM.  Pt asked to walk to bathroom to have BM.    Ambulation/Gait Ambulation/Gait assistance: Min assist;Min guard;+2 safety/equipment Gait Distance (Feet): 30 Feet(15 feet x 2) Assistive device: Rolling walker (2 wheeled) Gait Pattern/deviations: Decreased stride length;Shuffle;Scissoring;Ataxic;Narrow base of support;Drifts right/left   Gait velocity interpretation: <1.31 ft/sec, indicative of household ambulator General Gait Details: small steps close to scissoring at times which improved the more pt walked.  Pt stated, "I have to get my sea legs."  With cues and min guard assist fairly steady with cues for hands onRW and to steer at times.    Stairs             Wheelchair Mobility    Modified Rankin (Stroke Patients Only)       Balance Overall balance assessment: Needs assistance Sitting-balance support: No upper extremity supported;Feet supported Sitting balance-Leahy Scale: Fair Sitting balance - Comments: EOB with supervision   Standing balance support: Bilateral upper extremity supported Standing balance-Leahy Scale: Poor Standing balance comment: reliance on BUE support and external min guard assist                            Cognition Arousal/Alertness: Awake/alert Behavior During Therapy:  Impulsive;Restless Overall Cognitive Status: Difficult to assess Area of Impairment: Safety/judgement;Following commands;Awareness;Problem solving;Orientation                 Orientation Level: Disoriented to;Time;Situation Current Attention Level: Sustained Memory: Decreased short-term memory;Decreased recall of precautions Following Commands:  Follows one step commands consistently;Follows multi-step commands consistently Safety/Judgement: Decreased awareness of safety;Decreased awareness of deficits Awareness: Intellectual Problem Solving: Requires verbal cues;Requires tactile cues General Comments: Pt impulsive and requiring constant supervision to keep him from pulling on lines/leads.        Exercises General Exercises - Lower Extremity Quad Sets: Strengthening;Both;10 reps;Supine(prior to initiating SLR) Long Arc Quad: AROM;Both;10 reps;Seated    General Comments General comments (skin integrity, edema, etc.): Trach collar 28% with sats 96%. Other VSS      Pertinent Vitals/Pain Pain Assessment: No/denies pain    Home Living                      Prior Function            PT Goals (current goals can now be found in the care plan section) Acute Rehab PT Goals Patient Stated Goal: None stated Progress towards PT goals: Progressing toward goals    Frequency    Min 2X/week      PT Plan Current plan remains appropriate    Co-evaluation              AM-PAC PT "6 Clicks" Mobility   Outcome Measure  Help needed turning from your back to your side while in a flat bed without using bedrails?: None Help needed moving from lying on your back to sitting on the side of a flat bed without using bedrails?: None Help needed moving to and from a bed to a chair (including a wheelchair)?: A Little Help needed standing up from a chair using your arms (e.g., wheelchair or bedside chair)?: A Little Help needed to walk in hospital room?: A Little Help needed climbing 3-5 steps with a railing? : A Lot 6 Click Score: 19    End of Session Equipment Utilized During Treatment: Oxygen;Gait belt Activity Tolerance: Patient tolerated treatment well Patient left: with call bell/phone within reach;in chair;with chair alarm set Nurse Communication: Mobility status PT Visit Diagnosis: Muscle weakness (generalized)  (M62.81);Difficulty in walking, not elsewhere classified (R26.2) Pain - part of body: (abdomen)     Time: [id number] PT Time Calculation (min) (ACUTE ONLY): 29 min  Charges:  $Gait Training: 8-22 mins $Therapeutic Exercise: 8-22 mins                     Fleurette Woolbright W,PT Acute Rehabilitation Services Pager:  [phone]  Office:  [place] 08/01/2019, 11:35 AM

## 2019-08-01 NOTE — Progress Notes (Signed)
RT NOTE: RT capped patient's trach per Laurey Arrow, NP order. Patient tolerating well at this time. Patient is sating 95% on RA. Patient says that his breathing is comfortable. RN aware. RT will continue to monitor.

## 2019-08-02 LAB — CBC WITH DIFFERENTIAL/PLATELET
Abs Immature Granulocytes: 0.03 10*3/uL (ref 0.00–0.07)
Basophils Absolute: 0.1 10*3/uL (ref 0.0–0.1)
Basophils Relative: 1 %
Eosinophils Absolute: 0.5 10*3/uL (ref 0.0–0.5)
Eosinophils Relative: 5 %
HCT: 38.6 % — ABNORMAL LOW (ref 39.0–52.0)
Hemoglobin: 12.4 g/dL — ABNORMAL LOW (ref 13.0–17.0)
Immature Granulocytes: 0 %
Lymphocytes Relative: 21 %
Lymphs Abs: 1.8 10*3/uL (ref 0.7–4.0)
MCH: 25.9 pg — ABNORMAL LOW (ref 26.0–34.0)
MCHC: 32.1 g/dL (ref 30.0–36.0)
MCV: 80.8 fL (ref 80.0–100.0)
Monocytes Absolute: 0.7 10*3/uL (ref 0.1–1.0)
Monocytes Relative: 8 %
Neutro Abs: 5.6 10*3/uL (ref 1.7–7.7)
Neutrophils Relative %: 65 %
Platelets: 398 10*3/uL (ref 150–400)
RBC: 4.78 MIL/uL (ref 4.22–5.81)
RDW: 18.1 % — ABNORMAL HIGH (ref 11.5–15.5)
WBC: 8.7 10*3/uL (ref 4.0–10.5)
nRBC: 0 % (ref 0.0–0.2)

## 2019-08-02 LAB — COMPREHENSIVE METABOLIC PANEL
ALT: 45 U/L — ABNORMAL HIGH (ref 0–44)
AST: 23 U/L (ref 15–41)
Albumin: 3.1 g/dL — ABNORMAL LOW (ref 3.5–5.0)
Alkaline Phosphatase: 142 U/L — ABNORMAL HIGH (ref 38–126)
Anion gap: 14 (ref 5–15)
BUN: 35 mg/dL — ABNORMAL HIGH (ref 6–20)
CO2: 22 mmol/L (ref 22–32)
Calcium: 9.5 mg/dL (ref 8.9–10.3)
Chloride: 98 mmol/L (ref 98–111)
Creatinine, Ser: 0.93 mg/dL (ref 0.61–1.24)
GFR calc Af Amer: >60 mL/min (ref >60)
GFR calc non Af Amer: >60 mL/min (ref >60)
Glucose, Bld: 112 mg/dL — ABNORMAL HIGH (ref 70–99)
Potassium: 3.4 mmol/L — ABNORMAL LOW (ref 3.5–5.1)
Sodium: 134 mmol/L — ABNORMAL LOW (ref 135–145)
Total Bilirubin: 0.7 mg/dL (ref 0.3–1.2)
Total Protein: 7 g/dL (ref 6.5–8.1)

## 2019-08-02 LAB — MAGNESIUM: Magnesium: 1.7 mg/dL (ref 1.7–2.4)

## 2019-08-02 MED ORDER — POTASSIUM CHLORIDE 20 MEQ PO PACK
40.0000 meq | PACK | Freq: Two times a day (BID) | ORAL | Status: DC
  Filled 2019-08-02: qty 2

## 2019-08-02 MED ORDER — POTASSIUM CHLORIDE 20 MEQ PO PACK
40.0000 meq | PACK | Freq: Once | ORAL | Status: AC
  Administered 2019-08-02: 40 meq via ORAL
  Filled 2019-08-02: qty 2

## 2019-08-02 MED ORDER — POTASSIUM CHLORIDE 10 MEQ/100ML IV SOLN: 10.0000 meq | INTRAVENOUS | Status: DC

## 2019-08-02 NOTE — Plan of Care (Signed)

## 2019-08-02 NOTE — Progress Notes (Signed)
PROGRESS NOTE    Barrington L Ranae Palms.  [id number] DOB: 05/18/1961 DOA: 06/12/2019 PCP: Alvester Chou, NP     Brief Narrative:  Constance Holster. is a 59 y.o.malewith a history of alcohol/tobacco abuse, COPD, hyponatremia, empyema in June 2020 and admission for acute pancreatitis in November 6712 complicated by respiratory failure requiring tracheostomy at that time presented on 06/12/2019 with neck swelling found to have cellulitis with acute airway compromise resulting in PEA arrest requiring emergent tracheostomy.Neck CTA-thickening epiglottis with pharyngeal and supraglottic wall edema,also multifocal pneumonia by CT-recieved empiric antibiotics, racemic epinephrine with Decadron per ENT recommendation. Blood cultures grew H.Influenza 4/4, abx changed to Unasyn 1/5 >1/14 without any significant improvement. New right foot swelling noted on 1/14- MRI obtained to rule out infectious emboli; it showed synovitis and subcutaneous edema suggestive of cellulitis. Orthopedics consulted for right ankle aspiration - aspirate with positive crystals suggestive ofgout- patient started on NSAIDs and steroids. Pain meds titrated (started on dilaudid, methadone,D/Cedfentanyl). Patient completed Unasyn course on 1/23.  Hospital course complicated by delirium/agitation requiring versed/klonapin/precedex gtt while in ICU, anemiarequiring transfusion, elevated LFTs,and paroxysmal SVTs.On 1/17: Patient agitated early AM, q2h prn versed prescribed. Hb 5.2, transfused 2u pRBC; started methadone. On 1/19: Klonopin increased to tid and metoprolol added for SVTs but patient had subsequent bradycardia, clonidine tapered, precedex gtt discontinued. 1/21-1/24: placed on trach collar, then changed to 4 cuffless and sedation cut back slowly. However, on 1/30 patient returned to ICU for hypercarbic respiratory failure and encephalopathy. Finally liberated from ventilator on 2/06, takenoff precedex and transferred to  progressive care/TRH service on 2/09. PCCM continuingtracheostomy care, remains on trach collar. 2/11 Failed swallow study, PICC line removed 2/12. Clonazepam/methadone dosing being tapered down.Per wife,patient has history of dysphagia, suspected to have esophageal stenosis and was supposed to follow-up with GI for "stretching" as outpatient. Losartan/diltiazem discontinued2/14and patient given 250 normal saline bolus for hypotension. Seen by PCCM, ID, ENT and ortho during hospitalization.  New events last 24 hours / Subjective: Trach capped yesterday and tolerated well. He is eating breakfast without complaints of distress or shortness of breath. No nausea, vomiting.   Assessment & Plan:   Principal Problem:   Neck infection Active Problems:   Alcohol abuse   Poor dentition   Septic shock (HCC)   Laryngeal edema   Multifocal pneumonia   Airway compromise   Chronic hepatitis C without hepatic coma (HCC)   Acute respiratory failure with hypoxemia (HCC)   Cardiac arrest, cause unspecified (HCC)   Haemophilis influenzae bacteremia    Malnutrition of moderate degree   Atelectasis   Tracheostomy care ([place])   Acute metabolic encephalopathy   Hypercarbia   Tracheostomy status (HCC)   Pressure injury of skin   Acute hypoxic/hypercarbic respiratory failure, present on admission Neck cellulitus/swelling, tracheitis Multifocal PNA H. influenza bacteremia -Patient presented with neck swelling/infection with tracheitis and airway compromise. He received Decadron/racemic epinephrine per ENT recommendations and was intubated. Eventually required tracheostomy for failure to liberate from the vent.  -Completed antibiotic therapy -PCCM following for trach care. Trach capped 4/58  Acute metabolic encephalopathy -Improved -Continue seroquel  -Methadone being weaned    Dysphagia, history of esophageal stenosis  -SLP following -Currently on dysphagia 1 diet  Hypertension with  hypotensive episode on 2/14 -Catapres patch, Cozaar, metoprolol -Blood pressure stable this morning  COPD/tobacco use -Continue nicotine patch -DuoNeb as needed  PSVT -Patient did have PEA cardiac arrest on presentation requiring emergent tracheostomy -Continue metoprolol  Alcohol abuse -Out of window for  alcohol withdrawal -Continue multivitamin/thiamine/folic acid  Moderate protein calorie malnutrition in context of chronic illness with hypoalbuminemia -Now cortrak removed and patient tolerating diet -Appreciate dietitian  GERD -Continue Protonix  Hypokalemia -Replace, trend    In agreement with assessment of the pressure ulcer as below:  Pressure Injury 07/11/19 Neck Right Stage 3 -  Full thickness tissue loss. Subcutaneous fat may be visible but bone, tendon or muscle are NOT exposed. small wound adjacent to trach with purulent drainage (Active)  07/11/19 1630  Location: Neck  Location Orientation: Right  Staging: Stage 3 -  Full thickness tissue loss. Subcutaneous fat may be visible but bone, tendon or muscle are NOT exposed.  Wound Description (Comments): small wound adjacent to trach with purulent drainage  Present on Admission: No     DVT prophylaxis: Subcutaneous heparin Code Status: Full Family Communication: None at bedside  Disposition Plan:  . Patient is from home prior to admission. . Currently in-hospital treatment needed due to trach capping trial, weaning methadone. . Other barrier(s) to discharge include SNF placement pending.  . Suspect patient will discharge to SNF once tracheostomy plan in place, methadone weaned off.    Antimicrobials:   None currently   Objective: Vitals:   08/01/19 1935 08/01/19 1949 08/02/19 0749 08/02/19 0756  BP:  111/69 (!) 142/84   Pulse:  84 79 90  Resp: 16 19 18 18   Temp:  98.2 F (36.8 C) 97.7 F (36.5 C)   TempSrc:  Oral    SpO2:  98% 99%   Weight:      Height:        Intake/Output Summary (Last  24 hours) at 08/02/2019 0956 Last data filed at 08/02/2019 0500 Gross per 24 hour  Intake 480 ml  Output 150 ml  Net 330 ml   Filed Weights   07/14/19 0500 07/18/19 0427 07/21/19 0342  Weight: 69.7 kg 70.5 kg 70.2 kg    Examination:  General exam: Appears calm and comfortable  Respiratory system: Clear to auscultation. Respiratory effort normal. No respiratory distress. No conversational dyspnea.  Trach capped Cardiovascular system: S1 & S2 heard, RRR. No murmurs. No pedal edema. Gastrointestinal system: Abdomen is nondistended, soft and nontender. Normal bowel sounds heard. Central nervous system: Alert and oriented. No focal neurological deficits.  Extremities: Symmetric in appearance  Skin: No rashes, lesions or ulcers on exposed skin  Psychiatry: Judgement and insight appear normal. Mood & affect appropriate.   Data Reviewed: I have personally reviewed following labs and imaging studies  CBC: Recent Labs  Lab 07/29/19 0254 07/30/19 1315 07/31/19 0135 08/01/19 0134 08/02/19 0226  WBC 27.0* 10.8* 10.7* 9.4 8.7  NEUTROABS 25.7* 8.3* 7.7 6.3 5.6  HGB 12.7* 13.7 13.2 12.3* 12.4*  HCT 39.8 43.5 41.9 39.1 38.6*  MCV 81.7 81.6 82.0 81.5 80.8  PLT 450* 493* 453* 447* 607   Basic Metabolic Panel: Recent Labs  Lab 07/27/19 0228 07/27/19 0228 07/29/19 0254 07/30/19 1315 07/31/19 0135 08/01/19 0134 08/02/19 0226  NA 139   < > 138 140 141 133* 134*  K 4.2   < > 4.5 3.8 3.5 3.7 3.4*  CL 102   < > 101 101 101 98 98  CO2 26   < > 24 24 23  21* 22  GLUCOSE 121*   < > 126* 121* 123* 116* 112*  BUN 28*   < > 21* 29* 33* 37* 35*  CREATININE 0.82   < > 0.83 0.93 1.03 1.27* 0.93  CALCIUM 9.9   < >  10.0 10.4* 10.2 9.8 9.5  MG 1.9  --  1.8  --  1.9 1.8 1.7   < > = values in this interval not displayed.   GFR: Estimated Creatinine Clearance: 84.8 mL/min (by C-G formula based on SCr of 0.93 mg/dL). Liver Function Tests: Recent Labs  Lab 07/29/19 0254 07/30/19 1315  07/31/19 0135 08/01/19 0134 08/02/19 0226  AST 36 35 28 24 23   ALT 83* 74* 65* 50* 45*  ALKPHOS 195* 190* 168* 154* 142*  BILITOT 1.2 1.0 0.9 0.9 0.7  PROT 7.9 8.7* 8.2* 7.3 7.0  ALBUMIN 3.3* 3.6 3.2* 3.2* 3.1*   No results for input(s): LIPASE, AMYLASE in the last 168 hours. No results for input(s): AMMONIA in the last 168 hours. Coagulation Profile: No results for input(s): INR, PROTIME in the last 168 hours. Cardiac Enzymes: No results for input(s): CKTOTAL, CKMB, CKMBINDEX, TROPONINI in the last 168 hours. BNP (last 3 results) No results for input(s): PROBNP in the last 8760 hours. HbA1C: No results for input(s): HGBA1C in the last 72 hours. CBG: Recent Labs  Lab 07/31/19 1620 07/31/19 1953 08/01/19 0210 08/01/19 0411 08/01/19 0726  GLUCAP 104* 103* 101* 119* 107*   Lipid Profile: No results for input(s): CHOL, HDL, LDLCALC, TRIG, CHOLHDL, LDLDIRECT in the last 72 hours. Thyroid Function Tests: No results for input(s): TSH, T4TOTAL, FREET4, T3FREE, THYROIDAB in the last 72 hours. Anemia Panel: No results for input(s): VITAMINB12, FOLATE, FERRITIN, TIBC, IRON, RETICCTPCT in the last 72 hours. Sepsis Labs: No results for input(s): PROCALCITON, LATICACIDVEN in the last 168 hours.  No results found for this or any previous visit (from the past 240 hour(s)).    Radiology Studies: No results found.    Scheduled Meds: . bacitracin  1 application Topical BID  . bethanechol  5 mg Oral TID  . chlorhexidine gluconate (MEDLINE KIT)  15 mL Mouth Rinse BID  . Chlorhexidine Gluconate Cloth  6 each Topical Daily  . cloNIDine  0.2 mg Transdermal Weekly  . feeding supplement (ENSURE ENLIVE)  237 mL Oral 4x daily  . folic acid  1 mg Oral Daily  . guaiFENesin  400 mg Oral Q6H  . heparin injection (subcutaneous)  5,000 Units Subcutaneous Q8H  . losartan  25 mg Oral Daily  . mouth rinse  15 mL Mouth Rinse 10 times per day  . methadone  2.5 mg Oral Q12H  . metoprolol  tartrate  75 mg Oral BID  . multivitamin  15 mL Oral Daily  . nicotine  14 mg Transdermal Daily  . pantoprazole  40 mg Oral Daily  . polyethylene glycol  17 g Oral Daily  . QUEtiapine  200 mg Oral BID  . sodium chloride flush  10-40 mL Intracatheter Q12H  . thiamine  100 mg Oral Daily   Continuous Infusions: . sodium chloride Stopped (07/17/19 2053)  . sodium chloride       LOS: 51 days      Time spent: 35 minutes   Dessa Phi, DO [facility] 08/02/2019, 9:56 AM   Available via Epic secure chat 7am-7pm After these hours, please refer to coverage provider listed on [email or web address]

## 2019-08-02 NOTE — Progress Notes (Signed)
PULMONARY / CRITICAL CARE MEDICINE  NAME:  William Linden., MRN:  [id number], DOB:  April 12, 1961, LOS: 24 ADMISSION DATE:  06/12/2019, CONSULTATION DATE:  06/12/2019 REFERRING MD:  Alinda Sierras, CHIEF COMPLAINT:  Shortness of breath, sore throat, and neck swelling  BRIEF HISTORY:    59 yo male smoker with hx of ETOH had admission in November 2020 for pancreatitis and hyponatremia presented with dyspnea and neck swelling from cellulitis with acute airway compromise resulting in PEA requiring emergent tracheostomy.  Found to have H influenza bacteremia.  SIGNIFICANT PAST MEDICAL HISTORY   ETOH, Pneumonia, COPD, Rt lung empyema, Hep C, Pancreatitis  SIGNIFICANT EVENTS:  1/04 Lost airway and PEA arrest on, requiring emergent tracheostomy and ROSC obtained 1/05 Blood cultures 4/4 growing H.Influenza , narrowed to Unasyn 1/5 - 1/14 Continued on antibiotics without any significant improvement  1/14 New R foot swelling for which MRI obtained due to concerns for seeding cellulitis from neck 1/15 MRI with synovitis and subcutaneous edema suggestive of cellulitis; orthopedics consulted for right ankle aspiration - aspirate with positive crystals suggestive of gout - patient started on NSAIDs and steroids 1/16 Started on dilaudid, D/C fentanyl  1/17 Patient agitated early AM, q2h prn versed prescribed 1/18 Hb 5.2, transfused 2u pRBC; started methadone  1/19 Klonopin increased to tid and metoprolol added for SVTs 1/20 precedex gtt discontinued overnight for concerns of bradycardia  1/21 clonidine taper, 24hr trach collar 1/22 continuing trach collar 1/23 last day of unasyn for cellulitis, steroid taper and colchicine for gout, continuing trach collar  1/24 72 hours of trach collar 1/25 changed to 4 cuffless 1/26 sedation cut back 1/30 returned to ICU for hypercarbic respiratory failure and encephalopathy 2/2 inadvertent high dose of methadone and resultant resp faillure. Trach positioned in C[facility] so  underwent re-do 2/06 liberated from vent. Has been TC for 72 hours. 2/09 off precedex, transfer to progressive care 2/11 failed MBS 2/12 Required tracheal suctioning, decided to hold off on tracheostomy change over the weekend 2/22: Change to size 4 cuffless tracheostomy 2/23: Starting capping trials CULTURES:  Influenza A / B 1/4 >> Negative Sars Coronavirus 1/4 >> Negative Blood cultures 1/4 >> 4/4 Positive for H. Influenza  Urine culture 1/4 >> no growth BCx2 1/6 >> negative  Tracheal aspirate 1/13 >> few klebsiella pneumoniae  R ankle aspirate 1/16 >> monosodium urate crystals, no organisms on gram stain  Sputum culture 1/27 >> klebsiella >> S-cefazolin, cefepime  ANTIBIOTICS:  Bactrim 1/4 Azithromycin 1/4  Vancomycin 1/4 Zosyn  1/4 one dose Clindamycin 1/4 one dose Unasyn 1/5 >> 1/23  Ceftriaxone 1/29 (for klebsiella) >> completed  LINES/TUBES:  Tracheostomy 1/4 >> 1/30 (redo), 1/30 >> 2/22 changed to size 4 cuffless, 2/23 capping trials, 2/24 decannulated PICC line double lumen - placed 1/15  CONSULTANTS:  Infectious Disease  ENT  Orthopedic surgery  SUBJECTIVE:  No distress.  Has tolerated capping trials well OBJECTIVE:       CONSTITUTIONAL: Blood Pressure (Abnormal) 142/84 (BP Location: Left Arm)   Pulse 90   Temperature 97.7 F (36.5 C)   Respiration 18   Height 5\' 8"  (1.727 m)   Weight 70.2 kg   Oxygen Saturation 99%   Body Mass Index 23.54 kg/m   I/O last 3 completed shifts: In: 35 [P.O.:840] Out: 150 [Urine:150]       PHYSICAL EXAM: General this is a 59 year old white male he sitting up in bed and in no acute distress HEENT normocephalic atraumatic no jugular venous distention appreciated.  Excellent  phonation quality with capped size 4 cuffless trach.  The trach is now removed, phonation quality remains excellent, he is demonstrating cough splinting over stoma site independently Pulmonary: Clear to auscultation without accessory use currently  room air Cardiac: Regular rate and rhythm without murmur rub or gallop Abdomen: Soft nontender no organomegaly Extremities: Warm dry brisk cap refill Neuro: Awake oriented no focal deficits appropriate, rapidly improving on a day by day basis  RESOLVED PROBLEMS:  Klebsiella HCAP, Aspiration pneumonia  ASSESSMENT AND PLAN    Cellulitis of the Neck in setting of H influenza bacteremia  Tracheostomy Dependence secondary to above Tobacco Abuse with hx of COPD Acute Metabolic Encephalopathy from Sepsis Hx of ETOH, Opiod Dependence Hypertension Paroxysmal supraventricular tachycardia Elevated LFTs Severe Dysphagia  Delirium  Pulmonary Assessment: 59 year old history of alcohol abuse pancreatitis had soft tissue skin infection cellulitis of the neck with airway compromise requiring tracheostomy found to have H. influenzae bacteremia. Prolonged hospital course with respiratory failure.  Requiring tracheostomy tube.  Tracheostomy dependence Chronic respiratory failure Dysphagia  Discussion  He has continued to improve.  He tolerated capping trials with no issues whatsoever.  I have now decannulated him, he demonstrates excellent understanding of need to splint stoma site, his phonation quality and cough quality remain excellent  Plan Keep the current dressing in place for 24 to 48 hours, then can change with a simple Band-Aid daily until stoma completely closed If stoma not completely closed after 10 days call respiratory therapy department/trach clinic we will refer him to ENT for tracheocutaneous fistula closure however I doubt this will be needed No submerging underwater until tracheostomy stoma completely closed Okay to shower as long his trach stoma dressing intact Continue current diet  We will see him again on 2/25 to be sure everything looks okay, could be discharged to wherever plan disposition is from pulmonary standpoint as soon is after 24 hours  All other care per primary  service  Erick Colace ACNP-BC [place] Pager # [phone] OR # [phone] if no answer

## 2019-08-02 NOTE — Progress Notes (Signed)
Occupational Therapy Treatment Patient Details Name: [name]. MRN: [id number] DOB: 15-Dec-1960 Today's Date: 08/02/2019    History of present illness Pt is a 59 y.o. male admitted 06/12/19 for SOB and neck swelling. Admitted to ICU with loss of airway and PEA arrest requiring emergent trach; has been difficult to wean off sedation. 1/15 R foot MRI suggestive of cellulitis, s/p aspiration. 1/30 return to ICU for hypercarbic respiratory failure and encephalopathy. 2/06 on trach collar 72 hrs and vent liberated; 2/09 orders to leave ICU for progressive care  PMH includes recent admission for pancreatitis (04/10/19), ETOH and tobacco use, HTN.   OT comments  Pt without memory of events of this hospitalization, but aware his alcohol and tobacco use contributed to his illness. Continues to be impulsive with poor awareness of deficits and unsteady gait. NP in room at end of session to decannulate pt. Pt continues to report he does not have 24 hour care as his wife cleans houses. Continue to recommend SNF.  Follow Up Recommendations  SNF;Supervision/Assistance - 24 hour(HHOT if family can provide 24 hour care)    Equipment Recommendations  None recommended by OT    Recommendations for Other Services      Precautions / Restrictions Precautions Precautions: Fall Precaution Comments: decannulated 2/24       Mobility Bed Mobility               General bed mobility comments: pt OOB  Transfers Overall transfer level: Needs assistance Equipment used: Rolling walker (2 wheeled) Transfers: Sit to/from Stand Sit to Stand: Min guard         General transfer comment: cues for hand placement, min guard for safety    Balance Overall balance assessment: Needs assistance   Sitting balance-Leahy Scale: Good Sitting balance - Comments: no LOB with donning socks   Standing balance support: Single extremity supported Standing balance-Leahy Scale: Fair Standing balance comment:  statically at sink                           ADL either performed or assessed with clinical judgement   ADL Overall ADL's : Needs assistance/impaired Eating/Feeding: Independent   Grooming: Wash/dry hands;Standing;Min guard               Lower Body Dressing: Set up;Sitting/lateral leans   Toilet Transfer: Min guard;Ambulation;Regular Toilet;RW   Toileting- Clothing Manipulation and Hygiene: Minimal assistance;Sit to/from stand Toileting - Clothing Manipulation Details (indicate cue type and reason): for thoroughness     Functional mobility during ADLs: Min guard;Rolling walker;Cueing for safety       Vision       Perception     Praxis      Cognition Arousal/Alertness: Awake/alert Behavior During Therapy: Impulsive Overall Cognitive Status: Impaired/Different from baseline Area of Impairment: Safety/judgement;Problem solving;Orientation;Attention                         Safety/Judgement: Decreased awareness of deficits;Decreased awareness of safety   Problem Solving: Requires verbal cues;Requires tactile cues General Comments: continues to be impulsive, needs cues to locate soap at sink and decreased thoroughness with pericare        Exercises     Shoulder Instructions       General Comments      Pertinent Vitals/ Pain       Pain Assessment: Faces Faces Pain Scale: Hurts little more Pain Location: back Pain Descriptors / Indicators: Aching  Pain Intervention(s): Monitored during session;Repositioned  Home Living                                          Prior Functioning/Environment              Frequency  Min 2X/week        Progress Toward Goals  OT Goals(current goals can now be found in the care plan section)  Progress towards OT goals: Progressing toward goals  Acute Rehab OT Goals Patient Stated Goal: to go home OT Goal Formulation: With patient Time For Goal Achievement:  08/16/19 Potential to Achieve Goals: Good  Plan Discharge plan remains appropriate;Frequency remains appropriate    Co-evaluation                 AM-PAC OT "6 Clicks" Daily Activity     Outcome Measure   Help from another person eating meals?: None Help from another person taking care of personal grooming?: A Little Help from another person toileting, which includes using toliet, bedpan, or urinal?: A Little Help from another person bathing (including washing, rinsing, drying)?: A Little Help from another person to put on and taking off regular upper body clothing?: None Help from another person to put on and taking off regular lower body clothing?: A Little 6 Click Score: 20    End of Session Equipment Utilized During Treatment: Gait belt;Rolling walker  OT Visit Diagnosis: Other abnormalities of gait and mobility (R26.89);Muscle weakness (generalized) (M62.81);Other symptoms and signs involving cognitive function   Activity Tolerance Patient tolerated treatment well   Patient Left in chair;with call bell/phone within reach;with chair alarm set   Nurse Communication          Time: 1049-1110 OT Time Calculation (min): 21 min  Charges: OT General Charges $OT Visit: 1 Visit OT Treatments $Self Care/Home Management : 8-22 mins  Nestor Lewandowsky, OTR/L Acute Rehabilitation Services Pager: [phone] Office: [phone]   Malka So 08/02/2019, 11:10 AM

## 2019-08-02 NOTE — Progress Notes (Signed)
  Speech Language Pathology Treatment: Dysphagia  Patient Details Name: [name]. MRN: [id number] DOB: 05-25-1961 Today's Date: 08/02/2019 Time: [id number] SLP Time Calculation (min) (ACUTE ONLY): 15 min  Assessment / Plan / Recommendation Clinical Impression  Alekxander is making great strides in therapy. Suspect cognition may be getting closer to baseline. He is alert, able to follow commands for respiratory muscle strength training. Lurline Idol is now capped and respirations for speech and swallow are synchronized. Intermittent delayed throat clears after honey liquid and trials of Dys 2 and 3 textures not different from how he has been past 4-5 weeks. He admits to mild pharyngeal globus sensation which is in line with pharyngeal residue seen on MBS'. Provided education to alternate liquids and solids. Increased texture to Dys 2 (minced-ground), continue honey thick liquids, pills whole in puree, alternate liquids and solids.   Resistance on EMT (expiratory muscle trainer) increased from 7-18 (cm H20) with increased but not excessive effort. Her performed approximately 5-6 repetitions over 4 intervals. Left in reach of pt and advised/encouraged to perform 10 reps during commercial breaks.   HPI HPI: 59 year old man with a history of alcohol use disorder, tobacco use disorder, HTN, and recent admission on 04/10/2019 for pancreatitis and hyponatremia who presents for evaluation of shortness of breath and neck swelling with loss of airway and PEA arrest requiring emergent tracheostomy. Trach changed from 4 cuffless back to Shiley 6 cuffed on 1/30 after RR event, and transferred back to ICU. Chest x-ray of 2/19: Retrocardiac opacity may be atelectasis or pneumonia.  MBS 2/21- started on dys1/honey-thick liquids; trach downsized back to #4 cuffless on 2/22.       SLP Plan  Continue with current plan of care       Recommendations  Diet recommendations: Dysphagia 2 (fine chop);Honey-thick  liquid Liquids provided via: Cup;No straw Medication Administration: Whole meds with puree Supervision: Intermittent supervision to cue for compensatory strategies;Patient able to self feed Compensations: Small sips/bites;Slow rate;Follow solids with liquid;Effortful swallow Postural Changes and/or Swallow Maneuvers: Seated upright 90 degrees                Oral Care Recommendations: Oral care BID Follow up Recommendations: Skilled Nursing facility SLP Visit Diagnosis: Dysphagia, oropharyngeal phase (R13.12) Plan: Continue with current plan of care                       Houston Siren 08/02/2019, 10:28 AM   Orbie Pyo Colvin Caroli.Ed Risk analyst [phone] Office [phone]

## 2019-08-02 NOTE — NC FL2 (Signed)
[place] LEVEL OF CARE SCREENING TOOL     IDENTIFICATION  Patient Name: [name]. Birthdate: August 27, 1960 Sex: male Admission Date (Current Location): 06/12/2019  [facility] and [place] Number:  Herbalist and Address:  [facility], [place] [address], [place], [place] [address]      Provider Number: [id number]  Attending Physician Name and Address:  Dessa Phi, DO  Relative Name and Phone Number:  [name], Significant Other    Current Level of Care: Hospital Recommended Level of Care: [place] Prior Approval Number:    Date Approved/Denied:   PASRR Number: [id number]  Discharge Plan: SNF    Current Diagnoses: Patient Active Problem List   Diagnosis Date Noted  . Pressure injury of skin 07/17/2019  . Tracheostomy status ([place])   . Hypercarbia   . Atelectasis   . Tracheostomy care ([place])   . Acute metabolic encephalopathy   . Malnutrition of moderate degree 06/14/2019  . Haemophilis influenzae bacteremia  06/13/2019  . Septic shock ([place]) 06/12/2019  . Laryngeal edema 06/12/2019  . Multifocal pneumonia 06/12/2019  . Airway compromise 06/12/2019  . Chronic hepatitis C without hepatic coma ([place]) 06/12/2019  . Acute respiratory failure with hypoxemia ([place])   . Neck infection   . Cardiac arrest, cause unspecified ([place])   . Hyponatremia 05/01/2019  . Alcohol dependence with uncomplicated withdrawal ([place])   . Epigastric pain   . Alcohol-induced acute pancreatitis   . CAP (community acquired pneumonia) 11/26/2018  . Empyema of right pleural space ([place]) 11/26/2018  . Empyema, right ([place]) 11/26/2018  . Alcohol abuse   . Tobacco abuse   . Poor dentition   . Radiculopathy due to lumbar intervertebral disc disorder 12/29/2017  . Chronic left-sided low back pain with left-sided sciatica 11/30/2017  . 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[place]) 01/31/2017     Orientation RESPIRATION BLADDER Height & Weight     Self, Time, Situation, Place  Normal, Other (Comment)(trach decannulated, now on room air.) Continent Weight: 154 lb 12.8 oz (70.2 kg) Height:  5' 8"  (172.7 cm)  BEHAVIORAL SYMPTOMS/MOOD NEUROLOGICAL BOWEL NUTRITION STATUS      Continent Diet(SEE DC SUMMARY)  AMBULATORY STATUS COMMUNICATION OF NEEDS Skin   Extensive Assist Verbally Normal(dry, see dc summary)                       Personal Care Assistance Level of Assistance  Bathing, Feeding, Dressing, Total care Bathing Assistance: Limited assistance Feeding assistance: Independent Dressing Assistance: Limited assistance Total Care Assistance: Limited assistance   Functional Limitations Info  Speech, Hearing, Sight Sight Info: Adequate Hearing Info: Adequate Speech Info: Adequate    SPECIAL CARE FACTORS FREQUENCY  PT (By licensed PT), OT (By licensed OT)     PT Frequency: 4X OT Frequency: 4X            Contractures Contractures Info: Not present    Additional Factors Info  Code Status, Allergies, Psychotropic Code Status Info: FULL Allergies Info: NKA Psychotropic Info: QUEtiapine (SEROQUEL)         Current Medications (08/02/2019):  This is the current hospital active medication list Current Facility-Administered Medications  Medication Dose Route Frequency Provider Last Rate Last Admin  . 0.9 %  sodium chloride infusion   Intravenous PRN Chesley Mires, MD   Stopped at 07/17/19 2053  . acetaminophen (TYLENOL) tablet 650 mg  650 mg Oral Q6H PRN Marylyn Ishihara, Tyrone A, DO  650 mg at 08/02/19 1133   Or  . acetaminophen (TYLENOL) suppository 650 mg  650 mg Rectal Q4H PRN Marylyn Ishihara, Tyrone A, DO      . bacitracin ointment 1 application  1 application Topical BID Chesley Mires, MD   1 application at 23/55/73 0902  . bethanechol (URECHOLINE) tablet 5 mg  5 mg Oral TID Marylyn Ishihara, Tyrone A, DO   5 mg at 08/02/19 0900  . chlorhexidine gluconate (MEDLINE KIT) (PERIDEX) 0.12 %  solution 15 mL  15 mL Mouth Rinse BID Chesley Mires, MD   15 mL at 08/02/19 0900  . Chlorhexidine Gluconate Cloth 2 % PADS 6 each  6 each Topical Daily Chesley Mires, MD   6 each at 07/31/19 2232  . cloNIDine (CATAPRES - Dosed in mg/24 hr) patch 0.2 mg  0.2 mg Transdermal Weekly Kyle, Tyrone A, DO   0.2 mg at 07/29/19 1348  . feeding supplement (ENSURE ENLIVE) (ENSURE ENLIVE) liquid 237 mL  237 mL Oral 4x daily Kyle, Tyrone A, DO   237 mL at 08/02/19 1445  . folic acid (FOLVITE) tablet 1 mg  1 mg Oral Daily Kyle, Tyrone A, DO   1 mg at 08/02/19 0855  . guaiFENesin tablet 400 mg  400 mg Oral Q6H Kyle, Tyrone A, DO   400 mg at 08/02/19 1439  . haloperidol lactate (HALDOL) injection 5 mg  5 mg Intramuscular Q6H PRN Blount, Xenia T, NP      . heparin injection 5,000 Units  5,000 Units Subcutaneous Q8H Chesley Mires, MD   5,000 Units at 08/02/19 1440  . ipratropium-albuterol (DUONEB) 0.5-2.5 (3) MG/3ML nebulizer solution 3 mL  3 mL Nebulization Q6H PRN Chesley Mires, MD      . lactulose (CHRONULAC) 10 GM/15ML solution 10 g  10 g Oral Daily PRN Marylyn Ishihara, Tyrone A, DO      . losartan (COZAAR) tablet 25 mg  25 mg Oral Daily Kyle, Tyrone A, DO   25 mg at 08/02/19 0856  . MEDLINE mouth rinse  15 mL Mouth Rinse 10 times per day Chesley Mires, MD   15 mL at 08/02/19 1445  . methadone (DOLOPHINE) tablet 2.5 mg  2.5 mg Oral Q12H Kyle, Tyrone A, DO   2.5 mg at 08/02/19 0902  . metoprolol tartrate (LOPRESSOR) injection 5 mg  5 mg Intravenous Q6H PRN Marylyn Ishihara, Tyrone A, DO   5 mg at 07/30/19 1832  . metoprolol tartrate (LOPRESSOR) tablet 75 mg  75 mg Oral BID Marylyn Ishihara, Tyrone A, DO   75 mg at 08/02/19 0856  . multivitamin liquid 15 mL  15 mL Oral Daily [place] Shi, MD   15 mL at 08/02/19 0859  . nicotine (NICODERM CQ - dosed in mg/24 hours) patch 14 mg  14 mg Transdermal Daily Chesley Mires, MD   14 mg at 08/02/19 0857  . pantoprazole (PROTONIX) EC tablet 40 mg  40 mg Oral Daily Kyle, Tyrone A, DO   40 mg at 08/02/19 0855  .  polyethylene glycol (MIRALAX / GLYCOLAX) packet 17 g  17 g Oral Daily Kyle, Tyrone A, DO   17 g at 08/01/19 0829  . QUEtiapine (SEROQUEL) tablet 200 mg  200 mg Oral BID Marylyn Ishihara, Tyrone A, DO   200 mg at 08/02/19 0855  . Resource ThickenUp Clear   Oral PRN Marylyn Ishihara, Tyrone A, DO      . sodium chloride 0.9 % bolus 250 mL  250 mL Intravenous Once Benito Mccreedy, MD      .  sodium chloride flush (NS) 0.9 % injection 10-40 mL  10-40 mL Intracatheter PRN Chesley Mires, MD   10 mL at 07/23/19 2358  . sodium chloride flush (NS) 0.9 % injection 10-40 mL  10-40 mL Intracatheter Q12H Chesley Mires, MD   10 mL at 08/02/19 0901  . sodium chloride flush (NS) 0.9 % injection 10-40 mL  10-40 mL Intracatheter PRN Chesley Mires, MD   10 mL at 07/27/19 1039  . thiamine tablet 100 mg  100 mg Oral Daily Kyle, Tyrone A, DO   100 mg at 08/02/19 0768     Discharge Medications: Please see discharge summary for a list of discharge medications.  Relevant Imaging Results:  Relevant Lab Results:   Additional Information SSN: [id number]  [place], LCSW

## 2019-08-03 LAB — MAGNESIUM: Magnesium: 1.6 mg/dL — ABNORMAL LOW (ref 1.7–2.4)

## 2019-08-03 LAB — BASIC METABOLIC PANEL
Anion gap: 12 (ref 5–15)
BUN: 21 mg/dL — ABNORMAL HIGH (ref 6–20)
CO2: 22 mmol/L (ref 22–32)
Calcium: 9.5 mg/dL (ref 8.9–10.3)
Chloride: 100 mmol/L (ref 98–111)
Creatinine, Ser: 0.85 mg/dL (ref 0.61–1.24)
GFR calc Af Amer: >60 mL/min (ref >60)
GFR calc non Af Amer: >60 mL/min (ref >60)
Glucose, Bld: 108 mg/dL — ABNORMAL HIGH (ref 70–99)
Potassium: 4.2 mmol/L (ref 3.5–5.1)
Sodium: 134 mmol/L — ABNORMAL LOW (ref 135–145)

## 2019-08-03 MED ORDER — MAGNESIUM SULFATE 2 GM/50ML IV SOLN
2.0000 g | Freq: Once | INTRAVENOUS | Status: DC
  Filled 2019-08-03: qty 50

## 2019-08-03 MED ORDER — ZOLPIDEM TARTRATE 5 MG PO TABS
5.0000 mg | ORAL_TABLET | Freq: Once | ORAL | Status: AC
  Administered 2019-08-03: 5 mg via ORAL
  Filled 2019-08-03: qty 1

## 2019-08-03 MED ORDER — ACETAMINOPHEN 325 MG PO TABS
650.0000 mg | ORAL_TABLET | ORAL | Status: DC | PRN
  Administered 2019-08-03 – 2019-08-04 (×3): 650 mg via ORAL
  Filled 2019-08-03 (×3): qty 2

## 2019-08-03 MED ORDER — MAGNESIUM OXIDE 400 (241.3 MG) MG PO TABS
400.0000 mg | ORAL_TABLET | Freq: Two times a day (BID) | ORAL | Status: DC
  Administered 2019-08-03 – 2019-08-04 (×3): 400 mg via ORAL
  Filled 2019-08-03 (×3): qty 1

## 2019-08-03 NOTE — Progress Notes (Signed)
Physical Therapy Treatment Patient Details Name: [name]. MRN: [id number] DOB: Sep 20, 1960 Today's Date: 08/03/2019    History of Present Illness Pt is a 59 y.o. male admitted 06/12/19 for SOB and neck swelling. Admitted to ICU with loss of airway and PEA arrest requiring emergent trach; has been difficult to wean off sedation. 1/15 R foot MRI suggestive of cellulitis, s/p aspiration. 1/30 return to ICU for hypercarbic respiratory failure and encephalopathy. 2/6 on trach collar 72 hrs and vent liberated. Trach decannulated 2/24. PMH includes recent admission for pancreatitis (04/10/19), ETOH and tobacco use, HTN.   PT Comments    Pt progressing very well with mobility. Able to perform hallway gait training with and without DME, requiring up to minA for balance. SpO2 98% on RA. Pt's cognition improving, remains limited by some poor attention and decreased awareness. Wife present to observe session. Pt and wife no longer want to pursue post-acute rehab at SNF; report pt can have 24/7 assist available. Based on progression in functional mobility and family support, d/c recs updated to [facility] services.   Follow Up Recommendations  Home health PT;Supervision/Assistance - 24 hour     Equipment Recommendations  Rolling walker with 5" wheels;3in1 (PT)    Recommendations for Other Services       Precautions / Restrictions Precautions Precautions: Fall Restrictions Weight Bearing Restrictions: No    Mobility  Bed Mobility Overal bed mobility: Independent                Transfers Overall transfer level: Needs assistance Equipment used: Rolling walker (2 wheeled);None Transfers: Sit to/from Stand Sit to Stand: Supervision;Min guard         General transfer comment: Supervision with RW, min guard without DME  Ambulation/Gait Ambulation/Gait assistance: Min guard;Min assist Gait Distance (Feet): 400 Feet Assistive device: Rolling walker (2 wheeled);None Gait  Pattern/deviations: Step-through pattern;Decreased stride length;Drifts right/left   Gait velocity interpretation: 1.31 - 2.62 ft/sec, indicative of limited community ambulator General Gait Details: Slow, mildly unsteady gait with RW and intermittent min guard for balance, 1x self-correct LOB, intermittent cues for safety and technique; seated rest break due to fatigue and back/R knee pain, then additional gait training without DME, close min guard to minA for balance   Stairs             Wheelchair Mobility    Modified Rankin (Stroke Patients Only)       Balance Overall balance assessment: Needs assistance Sitting-balance support: No upper extremity supported;Feet supported Sitting balance-Leahy Scale: Good       Standing balance-Leahy Scale: Fair                              Cognition Arousal/Alertness: Awake/alert Behavior During Therapy: WFL for tasks assessed/performed Overall Cognitive Status: Impaired/Different from baseline Area of Impairment: Safety/judgement;Awareness                   Current Attention Level: Selective     Safety/Judgement: Decreased awareness of safety;Decreased awareness of deficits Awareness: Emergent   General Comments: A&Ox4. Answering questions and interacting appropriately. Poor attention (distracted by pain) but able to be redirected. Open to education and good teach back      Exercises      General Comments General comments (skin integrity, edema, etc.): SpO2 98% on RA. Wife present to observe session; pt and wife want pt to return home with HHPT, discussed safety with this as well as  assist/DME needed; pt will have 24/7 assist available, wife can assist with ADL tasks as needed      Pertinent Vitals/Pain Pain Assessment: Faces Faces Pain Scale: Hurts little more Pain Location: Lower back into RLE Pain Descriptors / Indicators: Discomfort;Sore Pain Intervention(s): Monitored during session;Patient  requesting pain meds-RN notified    Home Living                      Prior Function            PT Goals (current goals can now be found in the care plan section) Acute Rehab PT Goals Patient Stated Goal: Return home with [facility] services PT Goal Formulation: With patient/family Time For Goal Achievement: 08/17/19 Potential to Achieve Goals: Good Progress towards PT goals: Progressing toward goals    Frequency    Min 3X/week      PT Plan Discharge plan needs to be updated;Frequency needs to be updated    Co-evaluation              AM-PAC PT "6 Clicks" Mobility   Outcome Measure  Help needed turning from your back to your side while in a flat bed without using bedrails?: None Help needed moving from lying on your back to sitting on the side of a flat bed without using bedrails?: None Help needed moving to and from a bed to a chair (including a wheelchair)?: A Little Help needed standing up from a chair using your arms (e.g., wheelchair or bedside chair)?: A Little Help needed to walk in hospital room?: A Little Help needed climbing 3-5 steps with a railing? : A Little 6 Click Score: 20    End of Session Equipment Utilized During Treatment: Gait belt Activity Tolerance: Patient tolerated treatment well Patient left: in bed;with call bell/phone within reach;with bed alarm set;with family/visitor present Nurse Communication: Mobility status PT Visit Diagnosis: Muscle weakness (generalized) (M62.81);Difficulty in walking, not elsewhere classified (R26.2)     Time: 1535-1601 PT Time Calculation (min) (ACUTE ONLY): 26 min  Charges:  $Gait Training: 8-22 mins $Therapeutic Activity: 8-22 mins                    Mabeline Caras, PT, DPT Acute Rehabilitation Services  Pager [phone] Office [place] 08/03/2019, 4:59 PM

## 2019-08-03 NOTE — Progress Notes (Signed)
PROGRESS NOTE    [name].  RAQ:[id number] DOB: February 16, 1961 DOA: 06/12/2019 PCP: Alvester Chou, NP     Brief Narrative:  Constance Holster. is a 59 y.o.malewith a history of alcohol/tobacco abuse, COPD, hyponatremia, empyema in June 2020 and admission for acute pancreatitis in November 4562 complicated by respiratory failure requiring tracheostomy at that time presented on 06/12/2019 with neck swelling found to have cellulitis with acute airway compromise resulting in PEA arrest requiring emergent tracheostomy.Neck CTA-thickening epiglottis with pharyngeal and supraglottic wall edema,also multifocal pneumonia by CT-recieved empiric antibiotics, racemic epinephrine with Decadron per ENT recommendation. Blood cultures grew H.Influenza 4/4, abx changed to Unasyn 1/5 >1/14 without any significant improvement. New right foot swelling noted on 1/14- MRI obtained to rule out infectious emboli; it showed synovitis and subcutaneous edema suggestive of cellulitis. Orthopedics consulted for right ankle aspiration - aspirate with positive crystals suggestive ofgout- patient started on NSAIDs and steroids. Pain meds titrated (started on dilaudid, methadone,D/Cedfentanyl). Patient completed Unasyn course on 1/23.   Hospital course complicated by delirium/agitation requiring versed/klonapin/precedex gtt while in ICU, anemiarequiring transfusion, elevated LFTs,and paroxysmal SVTs.On 1/17: Patient agitated early AM, q2h prn versed prescribed. Hb 5.2, transfused 2u pRBC; started methadone. On 1/19: Klonopin increased to tid and metoprolol added for SVTs but patient had subsequent bradycardia, clonidine tapered, precedex gtt discontinued. 1/21-1/24: placed on trach collar, then changed to 4 cuffless and sedation cut back slowly. However, on 1/30 patient returned to ICU for hypercarbic respiratory failure and encephalopathy. Finally liberated from ventilator on 2/06, takenoff precedex and transferred to  progressive care/TRH service on 2/09. PCCM continuingtracheostomy care, remains on trach collar. 2/11 Failed swallow study, PICC line removed 2/12. Clonazepam/methadone dosing being tapered down.Per wife,patient has history of dysphagia, suspected to have esophageal stenosis and was supposed to follow-up with GI for "stretching" as outpatient. Losartan/diltiazem discontinued2/14and patient given 250 normal saline bolus for hypotension. Seen by PCCM, ID, ENT and ortho during hospitalization.   New events last 24 hours / Subjective: Trach decannulated yesterday. No issues this morning.  No complaints of shortness of breath.  Tolerating p.o.   Assessment & Plan:   Principal Problem:   Neck infection Active Problems:   Alcohol abuse   Poor dentition   Septic shock (HCC)   Laryngeal edema   Multifocal pneumonia   Airway compromise   Chronic hepatitis C without hepatic coma (HCC)   Acute respiratory failure with hypoxemia (HCC)   Cardiac arrest, cause unspecified (HCC)   Haemophilis influenzae bacteremia    Malnutrition of moderate degree   Atelectasis   Tracheostomy care ([place])   Acute metabolic encephalopathy   Hypercarbia   Tracheostomy status (HCC)   Pressure injury of skin   Acute hypoxic/hypercarbic respiratory failure, present on admission Neck cellulitus/swelling, tracheitis Multifocal PNA H. influenza bacteremia -Patient presented with neck swelling/infection with tracheitis and airway compromise. He received Decadron/racemic epinephrine per ENT recommendations and was intubated. Eventually required tracheostomy for failure to liberate from the vent.  -Completed antibiotic therapy -Tolerated trach capping trial.  Trach decannulated 2/24.  Acute metabolic encephalopathy -Improved -Continue seroquel  -Methadone being weaned    Dysphagia, history of esophageal stenosis  -SLP following -Currently on dysphagia diet  Hypertension with hypotensive episode on  2/14 -Catapres patch, Cozaar, metoprolol -Blood pressure stable today  COPD/tobacco use -Continue nicotine patch -DuoNeb as needed  PSVT -Patient did have PEA cardiac arrest on presentation requiring emergent tracheostomy -Continue metoprolol  Alcohol abuse -Out of window for alcohol withdrawal -Continue multivitamin/thiamine/folic  acid  Moderate protein calorie malnutrition in context of chronic illness with hypoalbuminemia -Now cortrak removed and patient tolerating diet -Appreciate dietitian  GERD -Continue Protonix  Hypomagnesemia -Replace, trend    In agreement with assessment of the pressure ulcer as below:  Pressure Injury 07/11/19 Neck Right Stage 3 -  Full thickness tissue loss. Subcutaneous fat may be visible but bone, tendon or muscle are NOT exposed. small wound adjacent to trach with purulent drainage (Active)  07/11/19 1630  Location: Neck  Location Orientation: Right  Staging: Stage 3 -  Full thickness tissue loss. Subcutaneous fat may be visible but bone, tendon or muscle are NOT exposed.  Wound Description (Comments): small wound adjacent to trach with purulent drainage  Present on Admission: No     DVT prophylaxis: Subcutaneous heparin Code Status: Full Family Communication: None at bedside  Disposition Plan:  . Patient is from home prior to admission. . Currently in-hospital treatment needed due to weaning methadone. . Other barrier(s) to discharge include SNF placement pending.  . Suspect patient will discharge to SNF once methadone weaned off.    Antimicrobials:   None currently   Objective: Vitals:   08/02/19 1940 08/03/19 0013 08/03/19 0014 08/03/19 0805  BP: 131/74 138/76  135/62  Pulse: 92 (!) 101 (!) 101 74  Resp:   16 18  Temp: 97.8 F (36.6 C) 99.2 F (37.3 C)  98.3 F (36.8 C)  TempSrc: Oral Oral  Oral  SpO2: 95% 92% 92% 98%  Weight:      Height:        Intake/Output Summary (Last 24 hours) at 08/03/2019 0951 Last  data filed at 08/03/2019 0554 Gross per 24 hour  Intake 1200 ml  Output 200 ml  Net 1000 ml   Filed Weights   07/14/19 0500 07/18/19 0427 07/21/19 0342  Weight: 69.7 kg 70.5 kg 70.2 kg    Examination: General exam: Appears calm and comfortable  Respiratory system: Clear to auscultation. Respiratory effort normal. Cardiovascular system: S1 & S2 heard, RRR. No pedal edema. Gastrointestinal system: Abdomen is nondistended, soft and nontender. Normal bowel sounds heard. Central nervous system: Alert and oriented. Non focal exam. Speech clear  Extremities: Symmetric in appearance bilaterally  Skin: No rashes, lesions or ulcers on exposed skin  Psychiatry: Judgement and insight appear stable. Mood & affect appropriate.   Data Reviewed: I have personally reviewed following labs and imaging studies  CBC: Recent Labs  Lab 07/29/19 0254 07/30/19 1315 07/31/19 0135 08/01/19 0134 08/02/19 0226  WBC 27.0* 10.8* 10.7* 9.4 8.7  NEUTROABS 25.7* 8.3* 7.7 6.3 5.6  HGB 12.7* 13.7 13.2 12.3* 12.4*  HCT 39.8 43.5 41.9 39.1 38.6*  MCV 81.7 81.6 82.0 81.5 80.8  PLT 450* 493* 453* 447* 481   Basic Metabolic Panel: Recent Labs  Lab 07/29/19 0254 07/29/19 0254 07/30/19 1315 07/31/19 0135 08/01/19 0134 08/02/19 0226 08/03/19 0240  NA 138   < > 140 141 133* 134* 134*  K 4.5   < > 3.8 3.5 3.7 3.4* 4.2  CL 101   < > 101 101 98 98 100  CO2 24   < > 24 23 21* 22 22  GLUCOSE 126*   < > 121* 123* 116* 112* 108*  BUN 21*   < > 29* 33* 37* 35* 21*  CREATININE 0.83   < > 0.93 1.03 1.27* 0.93 0.85  CALCIUM 10.0   < > 10.4* 10.2 9.8 9.5 9.5  MG 1.8  --   --  1.9 1.8 1.7 1.6*   < > = values in this interval not displayed.   GFR: Estimated Creatinine Clearance: 92.8 mL/min (by C-G formula based on SCr of 0.85 mg/dL). Liver Function Tests: Recent Labs  Lab 07/29/19 0254 07/30/19 1315 07/31/19 0135 08/01/19 0134 08/02/19 0226  AST 36 35 _0 ALT 83* 74* 65* 50* 45*  ALKPHOS 195* 190*  168* 154* 142*  BILITOT 1.2 1.0 0.9 0.9 0.7  PROT 7.9 8.7* 8.2* 7.3 7.0  ALBUMIN 3.3* 3.6 3.2* 3.2* 3.1*   No results for input(s): LIPASE, AMYLASE in the last 168 hours. No results for input(s): AMMONIA in the last 168 hours. Coagulation Profile: No results for input(s): INR, PROTIME in the last 168 hours. Cardiac Enzymes: No results for input(s): CKTOTAL, CKMB, CKMBINDEX, TROPONINI in the last 168 hours. BNP (last 3 results) No results for input(s): PROBNP in the last 8760 hours. HbA1C: No results for input(s): HGBA1C in the last 72 hours. CBG: Recent Labs  Lab 07/31/19 1620 07/31/19 1953 08/01/19 0210 08/01/19 0411 08/01/19 0726  GLUCAP 104* 103* 101* 119* 107*   Lipid Profile: No results for input(s): CHOL, HDL, LDLCALC, TRIG, CHOLHDL, LDLDIRECT in the last 72 hours. Thyroid Function Tests: No results for input(s): TSH, T4TOTAL, FREET4, T3FREE, THYROIDAB in the last 72 hours. Anemia Panel: No results for input(s): VITAMINB12, FOLATE, FERRITIN, TIBC, IRON, RETICCTPCT in the last 72 hours. Sepsis Labs: No results for input(s): PROCALCITON, LATICACIDVEN in the last 168 hours.  No results found for this or any previous visit (from the past 240 hour(s)).    Radiology Studies: No results found.    Scheduled Meds: . bacitracin  1 application Topical BID  . bethanechol  5 mg Oral TID  . chlorhexidine gluconate (MEDLINE KIT)  15 mL Mouth Rinse BID  . Chlorhexidine Gluconate Cloth  6 each Topical Daily  . cloNIDine  0.2 mg Transdermal Weekly  . feeding supplement (ENSURE ENLIVE)  237 mL Oral 4x daily  . folic acid  1 mg Oral Daily  . guaiFENesin  400 mg Oral Q6H  . heparin injection (subcutaneous)  5,000 Units Subcutaneous Q8H  . losartan  25 mg Oral Daily  . magnesium oxide  400 mg Oral BID  . mouth rinse  15 mL Mouth Rinse 10 times per day  . methadone  2.5 mg Oral Q12H  . metoprolol tartrate  75 mg Oral BID  . multivitamin  15 mL Oral Daily  . nicotine  14 mg  Transdermal Daily  . pantoprazole  40 mg Oral Daily  . polyethylene glycol  17 g Oral Daily  . QUEtiapine  200 mg Oral BID  . thiamine  100 mg Oral Daily   Continuous Infusions:    LOS: 52 days      Time spent: 25 minutes   Dessa Phi, DO [facility] 08/03/2019, 9:51 AM   Available via Epic secure chat 7am-7pm After these hours, please refer to coverage provider listed on [email or web address]

## 2019-08-03 NOTE — Progress Notes (Signed)
PULMONARY / CRITICAL CARE MEDICINE  NAME:  William Stmarie., MRN:  [id number], DOB:  Aug 10, 1960, LOS: 18 ADMISSION DATE:  06/12/2019, CONSULTATION DATE:  06/12/2019 REFERRING MD:  Alinda Sierras, CHIEF COMPLAINT:  Shortness of breath, sore throat, and neck swelling  BRIEF HISTORY:    59 yo male smoker with hx of ETOH had admission in November 2020 for pancreatitis and hyponatremia presented with dyspnea and neck swelling from cellulitis with acute airway compromise resulting in PEA requiring emergent tracheostomy.  Found to have H influenza bacteremia.  SIGNIFICANT PAST MEDICAL HISTORY   ETOH, Pneumonia, COPD, Rt lung empyema, Hep C, Pancreatitis  SIGNIFICANT EVENTS:  1/04 Lost airway and PEA arrest on, requiring emergent tracheostomy and ROSC obtained 1/05 Blood cultures 4/4 growing H.Influenza , narrowed to Unasyn 1/5 - 1/14 Continued on antibiotics without any significant improvement  1/14 New R foot swelling for which MRI obtained due to concerns for seeding cellulitis from neck 1/15 MRI with synovitis and subcutaneous edema suggestive of cellulitis; orthopedics consulted for right ankle aspiration - aspirate with positive crystals suggestive of gout - patient started on NSAIDs and steroids 1/16 Started on dilaudid, D/C fentanyl  1/17 Patient agitated early AM, q2h prn versed prescribed 1/18 Hb 5.2, transfused 2u pRBC; started methadone  1/19 Klonopin increased to tid and metoprolol added for SVTs 1/20 precedex gtt discontinued overnight for concerns of bradycardia  1/21 clonidine taper, 24hr trach collar 1/22 continuing trach collar 1/23 last day of unasyn for cellulitis, steroid taper and colchicine for gout, continuing trach collar  1/24 72 hours of trach collar 1/25 changed to 4 cuffless 1/26 sedation cut back 1/30 returned to ICU for hypercarbic respiratory failure and encephalopathy 2/2 inadvertent high dose of methadone and resultant resp faillure. Trach positioned in Cric so  underwent re-do 2/06 liberated from vent. Has been TC for 72 hours. 2/09 off precedex, transfer to progressive care 2/11 failed MBS 2/12 Required tracheal suctioning, decided to hold off on tracheostomy change over the weekend 2/22: Change to size 4 cuffless tracheostomy 2/23: Starting capping trials 2/24: Decannulated CULTURES:  Influenza A / B 1/4 >> Negative Sars Coronavirus 1/4 >> Negative Blood cultures 1/4 >> 4/4 Positive for H. Influenza  Urine culture 1/4 >> no growth BCx2 1/6 >> negative  Tracheal aspirate 1/13 >> few klebsiella pneumoniae  R ankle aspirate 1/16 >> monosodium urate crystals, no organisms on gram stain  Sputum culture 1/27 >> klebsiella >> S-cefazolin, cefepime  ANTIBIOTICS:  Bactrim 1/4 Azithromycin 1/4  Vancomycin 1/4 Zosyn  1/4 one dose Clindamycin 1/4 one dose Unasyn 1/5 >> 1/23  Ceftriaxone 1/29 (for klebsiella) >> completed  LINES/TUBES:  Tracheostomy 1/4 >> 1/30 (redo), 1/30 >> 2/22 changed to size 4 cuffless, 2/23 capping trials, 2/24 decannulated PICC line double lumen - placed 1/15  CONSULTANTS:  Infectious Disease  ENT  Orthopedic surgery  SUBJECTIVE:  Decannulated yesterday. Tolerating well. On room air. Denies shortness of breath or cough.  OBJECTIVE:       CONSTITUTIONAL: BP 135/62 (BP Location: Left Arm)   Pulse 74   Temp 98.3 F (36.8 C) (Oral)   Resp 18   Ht 5\' 8"  (1.727 m)   Wt 70.2 kg   SpO2 98%   BMI 23.54 kg/m   I/O last 3 completed shifts: In: 9 [P.O.:1680] Out: 350 [Urine:350]      Physical Exam: General: Well-appearing, no acute distress HENT: [place], AT, OP clear, MMM Neck: Dressing in place Eyes: EOMI, no scleral icterus Respiratory: Clear to auscultation  bilaterally.  No crackles, wheezing or rales Cardiovascular: RRR, -M/R/G, no JVD Extremities:-Edema,-tenderness Neuro: AAO x4, CNII-XII grossly intact Skin: Intact, no rashes or bruising Psych: Normal mood, normal affect  RESOLVED PROBLEMS:   Klebsiella HCAP, Aspiration pneumonia, tracheostomy dependence, chronic respiratory failure  ASSESSMENT AND PLAN    Cellulitis of the Neck in setting of H influenza bacteremia  Tracheostomy Dependence secondary to above Tobacco Abuse with hx of COPD Acute Metabolic Encephalopathy from Sepsis Hx of ETOH, Opiod Dependence Hypertension Paroxysmal supraventricular tachycardia Elevated LFTs Severe Dysphagia  Delirium  Pulmonary Assessment: 59 year old history of alcohol abuse pancreatitis had soft tissue skin infection cellulitis of the neck with airway compromise requiring tracheostomy found to have H. influenzae bacteremia. Prolonged hospital course with respiratory failure.  Requiring tracheostomy tube.  Tracheostomy dependence - resolved Chronic respiratory failure - resolved Dysphagia  Discussion  Decannulated yesterday and tolerating well.  Plan Keep the current dressing in place for 24 to 48 hours Can change to simple Band-Aid daily until stoma completely closed If stoma not completely closed after 10 days call respiratory therapy department/trach clinic and we will refer him to ENT for tracheocutaneous fistula closure however I doubt this will be needed No submerging underwater until tracheostomy stoma completely closed Okay to shower as long his trach stoma dressing intact Continue current diet  Pulmonary team will sign off.  Rodman Pickle, M.D. [facility] Pulmonary/Critical Care Medicine 08/03/2019 12:00 PM

## 2019-08-03 NOTE — Plan of Care (Signed)
  Problem: Education: Goal: Knowledge of General Education information will improve Description: Including pain rating scale, medication(s)/side effects and non-pharmacologic comfort measures Outcome: Progressing   Problem: Clinical Measurements: Goal: Will remain free from infection Outcome: Progressing Goal: Respiratory complications will improve Outcome: Progressing Goal: Cardiovascular complication will be avoided Outcome: Progressing   Problem: Activity: Goal: Risk for activity intolerance will decrease Outcome: Progressing   Problem: Nutrition: Goal: Adequate nutrition will be maintained Outcome: Progressing   Problem: Coping: Goal: Level of anxiety will decrease Outcome: Progressing   Problem: Elimination: Goal: Will not experience complications related to bowel motility Outcome: Progressing Goal: Will not experience complications related to urinary retention Outcome: Progressing   Problem: Pain Managment: Goal: General experience of comfort will improve Outcome: Progressing

## 2019-08-03 NOTE — Plan of Care (Signed)
  Problem: Education: Goal: Knowledge of General Education information will improve Description: Including pain rating scale, medication(s)/side effects and non-pharmacologic comfort measures Outcome: Progressing   Problem: Clinical Measurements: Goal: Will remain free from infection Outcome: Progressing Goal: Respiratory complications will improve Outcome: Progressing Goal: Cardiovascular complication will be avoided Outcome: Progressing   Problem: Activity: Goal: Risk for activity intolerance will decrease Outcome: Progressing   Problem: Nutrition: Goal: Adequate nutrition will be maintained Outcome: Progressing   Problem: Coping: Goal: Level of anxiety will decrease Outcome: Progressing   Problem: Elimination: Goal: Will not experience complications related to bowel motility Outcome: Progressing Goal: Will not experience complications related to urinary retention Outcome: Progressing   Problem: Pain Managment: Goal: General experience of comfort will improve Outcome: Progressing   Problem: Safety: Goal: Ability to remain free from injury will improve Outcome: Progressing   Problem: Skin Integrity: Goal: Risk for impaired skin integrity will decrease Outcome: Progressing   

## 2019-08-04 LAB — BASIC METABOLIC PANEL
Anion gap: 10 (ref 5–15)
BUN: 15 mg/dL (ref 6–20)
CO2: 25 mmol/L (ref 22–32)
Calcium: 9.6 mg/dL (ref 8.9–10.3)
Chloride: 100 mmol/L (ref 98–111)
Creatinine, Ser: 0.69 mg/dL (ref 0.61–1.24)
GFR calc Af Amer: >60 mL/min (ref >60)
GFR calc non Af Amer: >60 mL/min (ref >60)
Glucose, Bld: 113 mg/dL — ABNORMAL HIGH (ref 70–99)
Potassium: 4.2 mmol/L (ref 3.5–5.1)
Sodium: 135 mmol/L (ref 135–145)

## 2019-08-04 LAB — MAGNESIUM: Magnesium: 1.6 mg/dL — ABNORMAL LOW (ref 1.7–2.4)

## 2019-08-04 MED ORDER — METHADONE HCL 5 MG PO TABS: ORAL_TABLET | ORAL | 0 refills | Status: AC

## 2019-08-04 MED ORDER — MAGNESIUM OXIDE 400 (241.3 MG) MG PO TABS: 400.0000 mg | ORAL_TABLET | Freq: Two times a day (BID) | ORAL | 0 refills | Status: AC

## 2019-08-04 MED ORDER — QUETIAPINE FUMARATE 200 MG PO TABS: 200.0000 mg | ORAL_TABLET | Freq: Two times a day (BID) | ORAL | 0 refills | Status: AC

## 2019-08-04 MED ORDER — METOPROLOL TARTRATE 75 MG PO TABS: 75.0000 mg | ORAL_TABLET | Freq: Two times a day (BID) | ORAL | 0 refills | Status: AC

## 2019-08-04 MED ORDER — CLONIDINE HCL 0.2 MG PO TABS: 0.2000 mg | ORAL_TABLET | Freq: Two times a day (BID) | ORAL | 0 refills | Status: DC

## 2019-08-04 MED ORDER — FOLIC ACID 1 MG PO TABS: 1.0000 mg | ORAL_TABLET | Freq: Every day | ORAL | 0 refills | Status: AC

## 2019-08-04 MED ORDER — CLONIDINE 0.2 MG/24HR TD PTWK: 0.2000 mg | MEDICATED_PATCH | TRANSDERMAL | 0 refills | Status: DC

## 2019-08-04 MED ORDER — NICOTINE 14 MG/24HR TD PT24: 14.0000 mg | MEDICATED_PATCH | Freq: Every day | TRANSDERMAL | 0 refills | Status: AC

## 2019-08-04 MED ORDER — CLONIDINE HCL 0.2 MG PO TABS: 0.2000 mg | ORAL_TABLET | Freq: Two times a day (BID) | ORAL | 0 refills | Status: AC

## 2019-08-04 MED ORDER — THIAMINE HCL 100 MG PO TABS: 100.0000 mg | ORAL_TABLET | Freq: Every day | ORAL | 0 refills | Status: AC

## 2019-08-04 MED ORDER — METHADONE HCL 5 MG PO TABS: ORAL_TABLET | ORAL | 0 refills | Status: DC

## 2019-08-04 MED FILL — VITAMIN B-1 100 MG TABS: 100 | 30 days supply | Qty: 30 | Fill #0

## 2019-08-04 MED FILL — QUETIAPINE FUMARATE 200 MG: 200 | 30 days supply | Qty: 60 | Fill #0

## 2019-08-04 MED FILL — FOLIC ACID 1 MG TABS: 1 | 30 days supply | Qty: 30 | Fill #0

## 2019-08-04 MED FILL — METOPROLOL TARTRATE 25 MG T: 25 | 30 days supply | Qty: 180 | Fill #0

## 2019-08-04 MED FILL — MAGNESIUM OXIDE 400 MG TABS: 400 | 30 days supply | Qty: 60 | Fill #0

## 2019-08-04 MED FILL — NICOTINE 14 MG/24HR PATCH: 14 | 28 days supply | Qty: 28 | Fill #0

## 2019-08-04 MED FILL — cloNIDine HCL 0.2 MG TABS: 0.2 | 30 days supply | Qty: 60 | Fill #0

## 2019-08-04 NOTE — TOC Transition Note (Signed)
Transition of Care [facility]) - CM/SW Discharge Note   Patient Details  Name: [name]. MRN: [id number] Date of Birth: 1960-08-03  Transition of Care [facility]) CM/SW Contact:  Angelita Ingles, RN Phone Number: 08/04/2019, 12:02 PM   Clinical Narrative: Spoke with patient at the bedside and patient states that his wife will be at home with him once he is discharged.Patient also states that brother lives next door and is able to check on him. Patient states that he will go home via taxi and he feels that he is safe to do that. Verified with bedside nurse who agrees that patient can go home via taxi safely. Home health offers presented to the patient and patient is agreeable to have [facility] to provide home health services. Home health services set up with [facility]. DME rolling walker and 3 in 1 has been set up. Spoke with Thedore Mins at [employer].  Equipment to be delivered to room. Patient has been made aware of the plan. No further needs at this time.     Final next level of care: [place]     Patient Goals and CMS Choice Patient states their goals for this hospitalization and ongoing recovery are:: to go home and sit on hs couch CMS Medicare.gov Compare Post Acute Care list provided to:: Patient Choice offered to / list presented to : Patient  Discharge Placement                       Discharge Plan and Services                DME Arranged: 3-N-1, Walker rolling DME Agency: [employer] Date DME Agency Contacted: 08/04/19 Time DME Agency Contacted: [id number] Representative spoke with at DME Agency: Thedore Mins HH Arranged: PT, OT, Speech Therapy HH Agency: [place] Date [place]: 08/04/19 Time [place]: 1114 Representative spoke with at [place]: [place]) Interventions     Readmission Risk Interventions No flowsheet data found.

## 2019-08-04 NOTE — Discharge Summary (Signed)
Physician Discharge Summary  [name]. [id number] DOB: 04-08-1961 DOA: 06/12/2019  PCP: Alvester Chou, NP  Admit date: 06/12/2019 Discharge date: 08/04/2019  Admitted From: Home Disposition:  Home   Recommendations for Outpatient Follow-up:  1. Follow up with PCP in 1 week  Trach stoma care: -Can change to simple Band-Aid daily until stoma completely closed -If stoma not completely closed after 10 days call respiratory therapy department/trach clinic and we will refer him to ENT for tracheocutaneous fistula closure  -No submerging underwater until tracheostomy stoma completely closed -Okay to shower as long his trach stoma dressing intact -Continue current diet  Discharge Condition: Stable CODE STATUS: Full  Diet recommendation: Dysphagia 2   Brief/Interim Summary: William Salvage Oluwaseun Guadron. is a 59 y.o.malewith a history of alcohol/tobacco abuse, COPD, hyponatremia, empyema in June 2020 and admission for acute pancreatitis in November [id number] complicated by respiratory failure requiring tracheostomy at that time presented on 06/12/2019 with neck swelling found to have cellulitis with acute airway compromise resulting in PEA arrest requiring emergent tracheostomy.Neck CTA-thickening epiglottis with pharyngeal and supraglottic wall edema,also multifocal pneumonia by CT-recieved empiric antibiotics, racemic epinephrine with Decadron per ENT recommendation. Blood cultures grew H.Influenza 4/4, abx changed to Unasyn 1/5 >1/14 without any significant improvement. New right foot swelling noted on 1/14- MRI obtained to rule out infectious emboli; it showed synovitis and subcutaneous edema suggestive of cellulitis. Orthopedics consulted for right ankle aspiration - aspirate with positive crystals suggestive ofgout- patient started on NSAIDs and steroids. Pain meds titrated (started on dilaudid, methadone,D/Cedfentanyl). Patient completed Unasyn course on 1/23.   Hospital course complicated by  delirium/agitation requiring versed/klonapin/precedex gtt while in ICU, anemiarequiring transfusion, elevated LFTs,and paroxysmal SVTs.On 1/17: Patient agitated early AM, q2h prn versed prescribed. Hb 5.2, transfused 2u pRBC; started methadone. On 1/19: Klonopin increased to tid and metoprolol added for SVTs but patient had subsequent bradycardia, clonidine tapered, precedex gtt discontinued. 1/21-1/24: placed on trach collar, then changed to 4 cuffless and sedation cut back slowly. However, on 1/30 patient returned to ICU for hypercarbic respiratory failure and encephalopathy. Finally liberated from ventilator on 2/06, takenoff precedex and transferred to progressive care/TRH service on 2/09. PCCM continuingtracheostomy care, remains on trach collar. 2/11 Failed swallow study, PICC line removed 2/12. Clonazepam/methadone dosing being tapered down.Per wife,patient has history of dysphagia, suspected to have esophageal stenosis and was supposed to follow-up with GI for "stretching" as outpatient. Losartan/diltiazem discontinued2/14and patient given 250 normal saline bolus for hypotension. Seen by PCCM, ID, ENT and ortho during hospitalization.   He had slow improvement in mentation, was able to tolerate trach capping and advancement in diet. His trach was decannulated without issues.   Discharge Diagnoses:  Principal Problem:   Neck infection Active Problems:   Alcohol abuse   Poor dentition   Septic shock (HCC)   Laryngeal edema   Multifocal pneumonia   Airway compromise   Chronic hepatitis C without hepatic coma (HCC)   Acute respiratory failure with hypoxemia (HCC)   Cardiac arrest, cause unspecified (HCC)   Haemophilis influenzae bacteremia    Malnutrition of moderate degree   Atelectasis   Tracheostomy care ([place])   Acute metabolic encephalopathy   Hypercarbia   Tracheostomy status (HCC)   Pressure injury of skin   Acute hypoxic/hypercarbic respiratory failure, present on  admission Neck cellulitus/swelling, tracheitis Multifocal PNA H. influenza bacteremia -Patient presented with neck swelling/infection with tracheitis and airway compromise. He received Decadron/racemic epinephrine per ENT recommendations and was intubated. Eventually required tracheostomy for failure  to liberate from the vent.  -Completed antibiotic therapy -Tolerated trach capping trial.  Trach decannulated 2/24. -Stable on room air  -Stoma care as above   Acute metabolic encephalopathy -Resolved  -Continue seroquel  -Methadone being weaned    Dysphagia, history of esophageal stenosis  -SLP following -Currently on dysphagia diet  Hypertension with hypotensive episode on 2/14 -Catapres patch, Cozaar, metoprolol -Blood pressure stable today  COPD/tobacco use -Continue nicotine patch  PSVT -Patient did have PEA cardiac arrest on presentation requiring emergent tracheostomy -Continue metoprolol  Alcohol abuse -Out of window for alcohol withdrawal -Continue multivitamin/thiamine/folic acid  Moderate protein calorie malnutrition in context of chronic illness with hypoalbuminemia -Now cortrak removed and patient tolerating diet -Appreciate dietitian  Hypomagnesemia -Replace   In agreement with assessment of the pressure ulcer as below:  Pressure Injury 07/11/19 Neck Right Stage 3 -  Full thickness tissue loss. Subcutaneous fat may be visible but bone, tendon or muscle are NOT exposed. small wound adjacent to trach with purulent drainage (Active)  07/11/19 1630  Location: Neck  Location Orientation: Right  Staging: Stage 3 -  Full thickness tissue loss. Subcutaneous fat may be visible but bone, tendon or muscle are NOT exposed.  Wound Description (Comments): small wound adjacent to trach with purulent drainage  Present on Admission: No    Nutrition Problem: Moderate Malnutrition Etiology: chronic illness(COPD, alcoholism)  Discharge Instructions  Discharge  Instructions    Call MD for:  difficulty breathing, headache or visual disturbances   Complete by: As directed    Call MD for:  extreme fatigue   Complete by: As directed    Call MD for:  persistant dizziness or light-headedness   Complete by: As directed    Call MD for:  persistant nausea and vomiting   Complete by: As directed    Call MD for:  redness, tenderness, or signs of infection (pain, swelling, redness, odor or green/yellow discharge around incision site)   Complete by: As directed    Call MD for:  severe uncontrolled pain   Complete by: As directed    Call MD for:  temperature >100.4   Complete by: As directed    DIET DYS 2   Complete by: As directed    Fluid consistency: Thin   Discharge instructions   Complete by: As directed    You were cared for by a hospitalist during your hospital stay. If you have any questions about your discharge medications or the care you received while you were in the hospital after you are discharged, you can call the unit and ask to speak with the hospitalist on call if the hospitalist that took care of you is not available. Once you are discharged, your primary care physician will handle any further medical issues. Please note that NO REFILLS for any discharge medications will be authorized once you are discharged, as it is imperative that you return to your primary care physician (or establish a relationship with a primary care physician if you do not have one) for your aftercare needs so that they can reassess your need for medications and monitor your lab values.   Discharge instructions   Complete by: As directed    Trach stoma care: -Can change to simple Band-Aid daily until stoma completely closed -If stoma not completely closed after 10 days, call respiratory therapy department/trach clinic and we will refer you to ENT for tracheocutaneous fistula closure  -No submerging underwater until tracheostomy stoma completely closed -Okay to shower  as  long his trach stoma dressing intact -Continue current diet   Increase activity slowly   Complete by: As directed      Allergies as of 08/04/2019   No Active Allergies     Medication List    STOP taking these medications   gabapentin 300 MG capsule Commonly known as: Neurontin     TAKE these medications   cloNIDine 0.2 mg/24hr patch Commonly known as: CATAPRES - Dosed in mg/24 hr Place 1 patch (0.2 mg total) onto the skin once a week. Start taking on: August 05, 2019   ferrous sulfate 325 (65 FE) MG tablet Take 1 tablet (325 mg total) by mouth every other day.   folic acid 1 MG tablet Commonly known as: FOLVITE Take 1 tablet (1 mg total) by mouth daily. Start taking on: August 05, 2019   losartan 25 MG tablet Commonly known as: COZAAR Take 1 tablet (25 mg total) by mouth daily.   magnesium oxide 400 (241.3 Mg) MG tablet Commonly known as: MAG-OX Take 1 tablet (400 mg total) by mouth 2 (two) times daily.   methadone 5 MG tablet Commonly known as: DOLOPHINE Take 0.5 tablets (2.5 mg total) by mouth every 12 (twelve) hours for 5 days, THEN 0.5 tablets (2.5 mg total) daily for 7 days. Start taking on: August 04, 2019   Metoprolol Tartrate 75 MG Tabs Take 75 mg by mouth 2 (two) times daily. What changed:   medication strength  how much to take   nicotine 14 mg/24hr patch Commonly known as: NICODERM CQ - dosed in mg/24 hours Place 1 patch (14 mg total) onto the skin daily. Start taking on: August 05, 2019   QUEtiapine 200 MG tablet Commonly known as: SEROQUEL Take 1 tablet (200 mg total) by mouth 2 (two) times daily.   thiamine 100 MG tablet Take 1 tablet (100 mg total) by mouth daily. Start taking on: August 05, 2019       No Active Allergies    Procedures/Studies: DG Knee 1-2 Views Right  Result Date: 07/26/2019 CLINICAL DATA:  Right leg pain EXAM: RIGHT FEMUR 2 VIEWS; RIGHT KNEE - 1-2 VIEW COMPARISON:  None. FINDINGS: No acute  fracture or malalignment of the right femur. Right hip joint space is relatively preserved. Mild tricompartmental degenerative changes of the right knee, most pronounced within the medial compartment. Ossification adjacent to the medial femoral condyle likely reflecting sequela of remote MCL injury. No knee joint effusion. Scattered vascular calcifications. Soft tissues are otherwise within normal limits. IMPRESSION: No acute osseous abnormality of the right femur or knee. Electronically Signed   By: Davina Poke D.O.   On: 07/26/2019 15:57   DG Abd 1 View  Result Date: 07/26/2019 CLINICAL DATA:  Abdominal swelling EXAM: ABDOMEN - 1 VIEW COMPARISON:  07/23/2019 FINDINGS: Enteric tubing courses below the diaphragm with distal tip terminating in the expected location of the gastric body. Nonobstructive bowel gas pattern. IMPRESSION: Nonobstructive bowel gas pattern. Electronically Signed   By: Davina Poke D.O.   On: 07/26/2019 15:55   DG Abd 1 View  Result Date: 07/21/2019 CLINICAL DATA:  Dobbhoff tube placement. EXAM: ABDOMEN - 1 VIEW COMPARISON:  July 07, 2019 FINDINGS: Dobbhoff tube is identified distal tip in the mid stomach. There is no small bowel obstruction. IMPRESSION: Dobbhoff tube distal tip in the mid stomach. Electronically Signed   By: Abelardo Diesel M.D.   On: 07/21/2019 18:51   DG Abd 1 View  Result Date: 07/07/2019 CLINICAL DATA:  Nausea, vomiting EXAM: ABDOMEN - 1 VIEW COMPARISON:  07/06/2019 FINDINGS: Nonobstructive pattern of bowel gas with gas present to the rectum. There has been interval transit of enteric contrast material, which is now present in the ascending, transverse, and proximal descending colon. No free air in the abdomen. Esophagogastric tube may have been retracted in the interval, although the position of the side port relative to the diaphragm is not well imaged. IMPRESSION: 1. Nonobstructive pattern of bowel gas with gas present to the rectum. There has been  interval transit of enteric contrast material, which is now present in the ascending, transverse, and proximal descending colon. 2. Esophagogastric tube may have been retracted in the interval, although the position of the side port relative to the diaphragm is not well imaged. Correlate for return and consider advancement. Electronically Signed   By: Eddie Candle M.D.   On: 07/07/2019 09:01   DG CHEST PORT 1 VIEW  Result Date: 07/28/2019 CLINICAL DATA:  Acute respiratory failure with hypoxia. EXAM: PORTABLE CHEST 1 VIEW COMPARISON:  Radiograph 07/24/2019. CT 06/19/2019 FINDINGS: Tracheostomy tube tip at the thoracic inlet. Weighted enteric tube in place with tip below the diaphragm not included in the field of view. Unchanged cardiomegaly with aortic atherosclerosis. Interstitial opacities are unchanged from prior. Retrocardiac opacity appears similar. No new airspace disease. No pneumothorax. IMPRESSION: 1. Unchanged cardiomegaly and interstitial opacities, favoring pulmonary edema. Retrocardiac opacity may be atelectasis or pneumonia, unchanged. 2. Stable support apparatus. Electronically Signed   By: Keith Rake M.D.   On: 07/28/2019 06:38   DG CHEST PORT 1 VIEW  Result Date: 07/24/2019 CLINICAL DATA:  Acute respiratory failure with hypoxia. Additional history provided: Increased tracheal secretions, assess for new/worsening opacities. EXAM: PORTABLE CHEST 1 VIEW COMPARISON:  Radiograph of the chest and abdomen 07/23/2019. FINDINGS: A tracheostomy tube terminates just below the level of the clavicular heads. An enteric tube passes below the level of left hemidiaphragm with tip excluded from the field of view. Overlying cardiac monitoring leads. Unchanged cardiomegaly. Aortic atherosclerosis. Interval increase in conspicuity of a left basilar opacity. A small left pleural effusion is difficult to exclude. Similar appearance of prominent interstitial lung markings within the mid to lower lung fields  bilaterally. No evidence of right pleural effusion or evidence of pneumothorax. No acute bony abnormality. IMPRESSION: Support devices as described. Interval increase in conspicuity of a left basilar opacity. Findings may reflect atelectasis or pneumonia. Similar appearance of background interstitial prominence within the mid to lower lung fields which may reflect edema or infection. Unchanged cardiomegaly.  Aortic atherosclerosis. Electronically Signed   By: Kellie Simmering DO   On: 07/24/2019 16:42   DG Chest Port 1 View  Result Date: 07/18/2019 CLINICAL DATA:  Shortness of breath, chest pain, respiratory failure EXAM: PORTABLE CHEST 1 VIEW COMPARISON:  Radiograph 07/10/2019, CT 06/19/2019 FINDINGS: Tracheostomy apparatus terminates at the level of the mid trachea. A transesophageal feeding tube tip courses beyond the GE junction terminating below the level of imaging. Right upper extremity PICC tip terminates in the lower SVC. Telemetry leads overlie the chest. Increased mixed interstitial and patchy opacities throughout both lungs. Suspect left pleural effusion. No visible right effusion the portion of the costophrenic sulcus is collimated from view. No pneumothorax is evident. Cardiomegaly is similar to slightly increased from prior. Calcified aorta. Vascular calcium noted at the base of the neck as well. Chronic rib deformities are similar to prior. No acute osseous or soft tissue abnormality. CORONARY ARTERIES: 1. Increased mixed interstitial and  patchy opacities throughout both lungs worrisome for developing edema or worsening airspace disease. 2. Suspect left pleural effusion. 3. Cardiomegaly similar to slightly increased from prior. 4. Support devices as above. 5. Chronic rib deformities. Electronically Signed   By: Lovena Le M.D.   On: 07/18/2019 05:53   DG Chest Port 1 View  Result Date: 07/10/2019 CLINICAL DATA:  Shortness of breath and chest pain. EXAM: PORTABLE CHEST 1 VIEW COMPARISON:  July 08, 2019 FINDINGS: The mediastinal contour and cardiac silhouette are stable. Tracheostomy tube, right central venous line, nasogastric tube are unchanged. Patchy consolidation of the left mid lung and left lung base are identified. There is probable minimal left pleural effusion. Mild increased pulmonary interstitium with small nodularities are identified in the right upper lobe. The bony structures are stable. IMPRESSION: 1. Patchy consolidation of the left mid lung and left lung base, suspicious for pneumonia. 2. Mild increased pulmonary interstitium with small nodularities identified in the right upper lobe. Electronically Signed   By: Abelardo Diesel M.D.   On: 07/10/2019 13:53   Portable Chest x-ray  Result Date: 07/08/2019 CLINICAL DATA:  ETT placement EXAM: PORTABLE CHEST 1 VIEW COMPARISON:  July 07, 2019 FINDINGS: A feeding tube terminates below today's film. A right PICC line terminates in the central SVC. A tracheostomy tube appears to be in stable position. No pneumothorax. Opacity and probable associated effusion is seen in the left base. Increased interstitial markings on the right. New left perihilar opacity. IMPRESSION: 1. Support apparatus as above. 2. Continued opacity and probable associated effusion in the left base. Mild increased opacity in left perihilar region. Increased interstitial markings suggest pulmonary venous congestion. Electronically Signed   By: Dorise Bullion III M.D   On: 07/08/2019 12:42   DG CHEST PORT 1 VIEW  Result Date: 07/07/2019 CLINICAL DATA:  Cough. EXAM: PORTABLE CHEST 1 VIEW COMPARISON:  Chest x-ray dated July 03, 2019. FINDINGS: Unchanged tracheostomy tube. Unchanged right upper extremity PICC line. Interval exchange of the feeding tube for enteric tube, with the tip just inside the stomach and the proximal side port in the lower esophagus. Stable cardiomediastinal silhouette. Bilateral predominantly interstitial opacities have improved. Unchanged small  left pleural effusion with adjacent left basilar opacity, likely atelectasis. Resolved right pleural effusion. No pneumothorax. No acute osseous abnormality. IMPRESSION: 1. New enteric tube with proximal side port in the lower esophagus. Recommend advancing at least 8 cm. 2. Improved pulmonary edema and right pleural effusion. Unchanged small left pleural effusion. Electronically Signed   By: Titus Dubin M.D.   On: 07/07/2019 09:01   DG ABD ACUTE 2+V W 1V CHEST  Result Date: 07/23/2019 CLINICAL DATA:  Abdominal pain EXAM: DG ABDOMEN ACUTE W/ 1V CHEST COMPARISON:  07/21/2019 FINDINGS: Supine and upright frontal views of the abdomen as well as an upright frontal view of the chest are obtained. Tracheostomy tube is identified. Enteric catheter extends over the gastric body. Cardiac silhouette is enlarged but stable. Chronic central vascular congestion, interstitial prominence, and left basilar opacity unchanged. Favor edema. The bowel gas pattern is unremarkable. No obstruction or ileus. Rounded radiodensity overlying the lower pelvis may be on or beneath the patient. Stable spondylosis lumbar spine. IMPRESSION: 1. Persistent findings of pulmonary edema. Superimposed infection cannot be excluded. No significant change since prior study. 2. Support devices as above. 3. Unremarkable bowel gas pattern. Electronically Signed   By: Randa Ngo M.D.   On: 07/23/2019 15:21   DG Abd Portable 1V  Result Date: 07/06/2019  CLINICAL DATA:  Nasogastric tube placement EXAM: PORTABLE ABDOMEN - 1 VIEW COMPARISON:  Portable exam 1545 hours compared to 06/22/2019 FINDINGS: Tip of nasogastric tube projects over proximal stomach. Atelectasis versus consolidation LEFT lower lobe. Small amount retained contrast in small bowel loops in the RIGHT mid abdomen. Bowel gas pattern otherwise unremarkable. Degenerative changes spine. IMPRESSION: Tip of nasogastric tube projects over proximal stomach. Electronically Signed   By: Lavonia Dana M.D.   On: 07/06/2019 16:04   DG Swallowing Func-Speech Pathology  Result Date: 07/30/2019 Objective Swallowing Evaluation: Type of Study: MBS-Modified Barium Swallow Study  Patient Details Name: William Margeson. MRN: [id number] Date of Birth: 1960-10-28 Today's Date: 07/30/2019 Time: SLP Start Time (ACUTE ONLY): 1107 -SLP Stop Time (ACUTE ONLY): 1120 SLP Time Calculation (min) (ACUTE ONLY): 13 min Past Medical History: Past Medical History: Diagnosis Date . Alcohol abuse  . Alcoholic ([place])  . Back pain  . CAP (community acquired pneumonia) 11/26/2018 . COPD (chronic obstructive pulmonary disease) ([place])  . Empyema of right pleural space ([place]) 11/26/2018 . Heavy smoker  . Hep C w/ coma, chronic ([place])  . Loose, teeth  . Pancreatitis  . Poor dentition  . Tobacco abuse  Past Surgical History: Past Surgical History: Procedure Laterality Date . ARTERY REPAIR  2013  neck . BIOPSY  02/19/2019  Procedure: BIOPSY;  Surgeon: Ronnette Juniper, MD;  Location: [facility] ENDOSCOPY;  Service: Gastroenterology;; . ESOPHAGOGASTRODUODENOSCOPY (EGD) WITH PROPOFOL N/A 02/19/2019  Procedure: ESOPHAGOGASTRODUODENOSCOPY (EGD) WITH PROPOFOL;  Surgeon: Ronnette Juniper, MD;  Location: [facility];  Service: Gastroenterology;  Laterality: N/A; . FOREIGN BODY REMOVAL  02/19/2019  Procedure: FOREIGN BODY REMOVAL;  Surgeon: Ronnette Juniper, MD;  Location: [facility] ENDOSCOPY;  Service: Gastroenterology;; . KNEE SURGERY  2013  rt . MICROLARYNGOSCOPY N/A 07/31/2014  Procedure: MICROLARYNGOSCOPY WITH BIOPSY ;  Surgeon: Rozetta Nunnery, MD;  Location: [facility];  Service: ENT;  Laterality: N/A; . NECK SURGERY  2013  cervical fusion- . VASCULAR SURGERY   . VIDEO ASSISTED THORACOSCOPY (VATS)/EMPYEMA Right 11/26/2018  Procedure: VIDEO ASSISTED THORACOSCOPY (VATS)/EMPYEMA;  Surgeon: Rexene Alberts, MD;  Location: [place];  Service: Thoracic;  Laterality: Right; [name] VIDEO BRONCHOSCOPY  11/26/2018  Procedure: Video Bronchoscopy;  Surgeon: Rexene Alberts, MD;   Location: [facility];  Service: Thoracic;; HPI: 59 year old man with a history of alcohol use disorder, tobacco use disorder, HTN, and recent admission on 04/10/2019 for pancreatitis and hyponatremia who presents for evaluation of shortness of breath and neck swelling with loss of airway and PEA arrest requiring emergent tracheostomy. Trach changed from 4 cuffless back to [place] 6 cuffed on 1/30 after RR event, and transferred back to ICU. Chest x-ray of 2/19: Retrocardiac opacity may be atelectasis or pneumonia  Subjective: Pt was alert and cooperative Assessment / Plan / Recommendation CHL IP CLINICAL IMPRESSIONS 07/30/2019 Clinical Impression Pt has demonstrated some improvement in swallow function as evidenced by improved epiglottic inversion, reduced pharyngeal residue, and reduced laryngeal invasion. He presents with moderate oropharyngeal dysphagia characterized by impaired bolus control, a pharyngeal delay, reduced hyolaryngeal elevation, and reduced anterior laryngeal movement. He demonstrated vallecular residue across consistencies secondary to reduced epiglottic inversion; amount of residue increased with bolus size and viscosity. Epiglottic inversion was complete with more viscous boluses such as honey thick liquids and puree. The swallow was often triggered with head of the bolus at the level of pyriform sinuses and penetration (PAS 2,3) and aspiration (PAS 8) were noted. Pharyngeal residue was reduced and epiglottic inversion improved  with use of an effortful swallow; however, laryngeal invasion was still demonstration. A dysphagia 1 (puree) diet with honey thick liquids is recommended at this time with full supervision for observance of swallowing precautions. SLP will continue to follow to assess diet tolerance and for dysphagia treatment.  SLP Visit Diagnosis Dysphagia, oropharyngeal phase (R13.12) Attention and concentration deficit following -- Frontal lobe and executive function deficit following --  Impact on safety and function Mild aspiration risk;Moderate aspiration risk   CHL IP TREATMENT RECOMMENDATION 07/30/2019 Treatment Recommendations Therapy as outlined in treatment plan below   Prognosis 07/30/2019 Prognosis for Safe Diet Advancement Good Barriers to Reach Goals Cognitive deficits Barriers/Prognosis Comment -- CHL IP DIET RECOMMENDATION 07/30/2019 SLP Diet Recommendations Dysphagia 1 (Puree) solids;Honey thick liquids Liquid Administration via Cup;No straw Medication Administration Crushed with puree Compensations Small sips/bites;Slow rate;Follow solids with liquid;Effortful swallow Postural Changes Seated upright at 90 degrees   CHL IP OTHER RECOMMENDATIONS 07/30/2019 Recommended Consults -- Oral Care Recommendations Oral care BID Other Recommendations Place PMSV during PO intake;Order thickener from pharmacy   CHL IP FOLLOW UP RECOMMENDATIONS 07/30/2019 Follow up Recommendations Skilled Nursing facility   [facility] IP FREQUENCY AND DURATION 07/30/2019 Speech Therapy Frequency (ACUTE ONLY) min 2x/week Treatment Duration 2 weeks      CHL IP ORAL PHASE 07/30/2019 Oral Phase Impaired Oral - Pudding Teaspoon -- Oral - Pudding Cup -- Oral - Honey Teaspoon -- Oral - Honey Cup Lingual/palatal residue Oral - Nectar Teaspoon -- Oral - Nectar Cup Decreased bolus cohesion;Lingual/palatal residue Oral - Nectar Straw -- Oral - Thin Teaspoon -- Oral - Thin Cup Premature spillage Oral - Thin Straw -- Oral - Puree Lingual/palatal residue Oral - Mech Soft -- Oral - Regular -- Oral - Multi-Consistency -- Oral - Pill -- Oral Phase - Comment --  CHL IP PHARYNGEAL PHASE 07/30/2019 Pharyngeal Phase Impaired Pharyngeal- Pudding Teaspoon -- Pharyngeal -- Pharyngeal- Pudding Cup -- Pharyngeal -- Pharyngeal- Honey Teaspoon -- Pharyngeal -- Pharyngeal- Honey Cup Penetration/Aspiration during swallow;Pharyngeal residue - valleculae;Pharyngeal residue - pyriform;Pharyngeal residue - posterior pharnyx Pharyngeal Material enters airway,  remains ABOVE vocal cords then ejected out Pharyngeal- Nectar Teaspoon Penetration/Aspiration during swallow;Reduced anterior laryngeal mobility;Reduced laryngeal elevation;Pharyngeal residue - valleculae;Pharyngeal residue - pyriform;Reduced epiglottic inversion Pharyngeal Material enters airway, remains ABOVE vocal cords and not ejected out Pharyngeal- Nectar Cup -- Pharyngeal -- Pharyngeal- Nectar Straw -- Pharyngeal -- Pharyngeal- Thin Teaspoon Penetration/Aspiration during swallow;Reduced anterior laryngeal mobility;Reduced laryngeal elevation;Pharyngeal residue - valleculae;Pharyngeal residue - pyriform;Delayed swallow initiation-vallecula;Delayed swallow initiation-pyriform sinuses;Reduced epiglottic inversion Pharyngeal Material enters airway, passes BELOW cords without attempt by patient to eject out (silent aspiration) Pharyngeal- Thin Cup NT Pharyngeal -- Pharyngeal- Thin Straw -- Pharyngeal -- Pharyngeal- Puree Reduced anterior laryngeal mobility;Reduced laryngeal elevation;Pharyngeal residue - valleculae;Pharyngeal residue - pyriform Pharyngeal -- Pharyngeal- Mechanical Soft Reduced anterior laryngeal mobility;Reduced laryngeal elevation;Pharyngeal residue - valleculae;Pharyngeal residue - pyriform Pharyngeal -- Pharyngeal- Regular -- Pharyngeal -- Pharyngeal- Multi-consistency -- Pharyngeal -- Pharyngeal- Pill -- Pharyngeal -- Pharyngeal Comment --  CHL IP CERVICAL ESOPHAGEAL PHASE 07/20/2019 Cervical Esophageal Phase Impaired Pudding Teaspoon -- Pudding Cup -- Honey Teaspoon -- Honey Cup -- Nectar Teaspoon -- Nectar Cup -- Nectar Straw -- Thin Teaspoon -- Thin Cup -- Thin Straw -- Puree -- Mechanical Soft -- Regular -- Multi-consistency -- Pill -- Cervical Esophageal Comment -- Shanika I. Hardin Negus, [place], [place] Office number [phone] Pager Ackerman 07/30/2019, 1:33 PM              DG Swallowing Func-Speech  Pathology  Result Date:  07/20/2019 Objective Swallowing Evaluation: Type of Study: MBS-Modified Barium Swallow Study  Patient Details Name: William Mihelic. MRN: [id number] Date of Birth: Nov 07, 1960 Today's Date: 07/20/2019 Time: SLP Start Time (ACUTE ONLY): 1030 -SLP Stop Time (ACUTE ONLY): 1050 SLP Time Calculation (min) (ACUTE ONLY): 20 min Past Medical History: Past Medical History: Diagnosis Date . Alcohol abuse  . Alcoholic ([place])  . Back pain  . CAP (community acquired pneumonia) 11/26/2018 . COPD (chronic obstructive pulmonary disease) ([place])  . Empyema of right pleural space ([place]) 11/26/2018 . Heavy smoker  . Hep C w/ coma, chronic ([place])  . Loose, teeth  . Pancreatitis  . Poor dentition  . Tobacco abuse  Past Surgical History: Past Surgical History: Procedure Laterality Date . ARTERY REPAIR  2013  neck . BIOPSY  02/19/2019  Procedure: BIOPSY;  Surgeon: Ronnette Juniper, MD;  Location: [facility] ENDOSCOPY;  Service: Gastroenterology;; . ESOPHAGOGASTRODUODENOSCOPY (EGD) WITH PROPOFOL N/A 02/19/2019  Procedure: ESOPHAGOGASTRODUODENOSCOPY (EGD) WITH PROPOFOL;  Surgeon: Ronnette Juniper, MD;  Location: [place];  Service: Gastroenterology;  Laterality: N/A; . FOREIGN BODY REMOVAL  02/19/2019  Procedure: FOREIGN BODY REMOVAL;  Surgeon: Ronnette Juniper, MD;  Location: [facility] ENDOSCOPY;  Service: Gastroenterology;; . KNEE SURGERY  2013  rt . MICROLARYNGOSCOPY N/A 07/31/2014  Procedure: MICROLARYNGOSCOPY WITH BIOPSY ;  Surgeon: Rozetta Nunnery, MD;  Location: [place];  Service: ENT;  Laterality: N/A; . NECK SURGERY  2013  cervical fusion- . VASCULAR SURGERY   . VIDEO ASSISTED THORACOSCOPY (VATS)/EMPYEMA Right 11/26/2018  Procedure: VIDEO ASSISTED THORACOSCOPY (VATS)/EMPYEMA;  Surgeon: Rexene Alberts, MD;  Location: [place];  Service: Thoracic;  Laterality: Right; [name] VIDEO BRONCHOSCOPY  11/26/2018  Procedure: Video Bronchoscopy;  Surgeon: Rexene Alberts, MD;  Location: [place];  Service: Thoracic;; HPI: 59 year old man with a history of alcohol use  disorder, tobacco use disorder, HTN, and recent admission on 04/10/2019 for pancreatitis and hyponatremia who presents for evaluation of shortness of breath and neck swelling with loss of airway and PEA arrest requiring emergent tracheostomy. Trach changed from 4 cuffless back to [place] 6 cuffed on 1/30 after RR event, and transferred back to ICU.  Pt has been difficult to wean from sedation and intermittently agitated.  Subjective: Pt was alert and cooperative Assessment / Plan / Recommendation CHL IP CLINICAL IMPRESSIONS 07/20/2019 Clinical Impression PMV donned at onset of MBS. Unfortunately pt did not have significant improvements from initial MBS 07/06/19. He continues to demonstrate laryngeal penetration, pharyngeal residue. Hyoid extends forward during swallow but elevation is depressed therefore epiglottis hits the pharynx but does inverts minimally. Puree on back of epiglottis fell into airway and was mostly ejected during swallow; honey thick was penetrated close to vocal cords during swallow to clear residue. Aspiration present and brief with thin. Reduced epiglottic inversion also led to maximum residue post swallow with puree and moderate with honey. He was not appropriate for compensatory techniques to mitigate impairments. Recommend he continue npo status- SLP will continue to work with pt- he is just starting to have increased periods of wakefullness and periods of somewhat clearer cognition. Hopefully can avoid longer term alternate means of nutrition but uncertain of this.    SLP Visit Diagnosis Dysphagia, oropharyngeal phase (R13.12) Attention and concentration deficit following -- Frontal lobe and executive function deficit following -- Impact on safety and function Severe aspiration risk   CHL IP TREATMENT RECOMMENDATION 07/20/2019 Treatment Recommendations Therapy as outlined in treatment plan below   Prognosis 07/20/2019 Prognosis  for Safe Diet Advancement Good Barriers to Reach Goals Cognitive  deficits Barriers/Prognosis Comment -- CHL IP DIET RECOMMENDATION 07/20/2019 SLP Diet Recommendations NPO Liquid Administration via -- Medication Administration Via alternative means Compensations -- Postural Changes --   CHL IP OTHER RECOMMENDATIONS 07/20/2019 Recommended Consults -- Oral Care Recommendations Oral care QID Other Recommendations --   CHL IP FOLLOW UP RECOMMENDATIONS 07/20/2019 Follow up Recommendations Skilled Nursing facility   [facility] IP FREQUENCY AND DURATION 07/20/2019 Speech Therapy Frequency (ACUTE ONLY) min 2x/week Treatment Duration 2 weeks      CHL IP ORAL PHASE 07/20/2019 Oral Phase Impaired Oral - Pudding Teaspoon -- Oral - Pudding Cup -- Oral - Honey Teaspoon -- Oral - Honey Cup Delayed oral transit;Lingual pumping Oral - Nectar Teaspoon -- Oral - Nectar Cup Lingual pumping Oral - Nectar Straw -- Oral - Thin Teaspoon -- Oral - Thin Cup WFL Oral - Thin Straw -- Oral - Puree Reduced posterior propulsion Oral - Mech Soft -- Oral - Regular -- Oral - Multi-Consistency -- Oral - Pill -- Oral Phase - Comment --  CHL IP PHARYNGEAL PHASE 07/20/2019 Pharyngeal Phase Impaired Pharyngeal- Pudding Teaspoon -- Pharyngeal -- Pharyngeal- Pudding Cup -- Pharyngeal -- Pharyngeal- Honey Teaspoon Penetration/Aspiration during swallow Pharyngeal -- Pharyngeal- Honey Cup NT Pharyngeal -- Pharyngeal- Nectar Teaspoon NT Pharyngeal -- Pharyngeal- Nectar Cup NT Pharyngeal -- Pharyngeal- Nectar Straw -- Pharyngeal -- Pharyngeal- Thin Teaspoon Penetration/Aspiration during swallow Pharyngeal Material enters airway, passes BELOW cords then ejected out Pharyngeal- Thin Cup NT Pharyngeal -- Pharyngeal- Thin Straw -- Pharyngeal -- Pharyngeal- Puree Pharyngeal residue - valleculae;Penetration/Aspiration during swallow Pharyngeal Material enters airway, remains ABOVE vocal cords and not ejected out Pharyngeal- Mechanical Soft -- Pharyngeal -- Pharyngeal- Regular -- Pharyngeal -- Pharyngeal- Multi-consistency -- Pharyngeal --  Pharyngeal- Pill -- Pharyngeal -- Pharyngeal Comment --  CHL IP CERVICAL ESOPHAGEAL PHASE 07/20/2019 Cervical Esophageal Phase Impaired Pudding Teaspoon -- Pudding Cup -- Honey Teaspoon -- Honey Cup -- Nectar Teaspoon -- Nectar Cup -- Nectar Straw -- Thin Teaspoon -- Thin Cup -- Thin Straw -- Puree -- Mechanical Soft -- Regular -- Multi-consistency -- Pill -- Cervical Esophageal Comment -- Houston Siren 07/20/2019, 1:27 PM              DG Swallowing Func-Speech Pathology  Result Date: 07/06/2019 Objective Swallowing Evaluation: Type of Study: MBS-Modified Barium Swallow Study  Patient Details Name: William Commander. MRN: [id number] Date of Birth: 1960-08-02 Today's Date: 07/06/2019 Time: SLP Start Time (ACUTE ONLY): 0940 -SLP Stop Time (ACUTE ONLY): 0957 SLP Time Calculation (min) (ACUTE ONLY): 17 min Past Medical History: Past Medical History: Diagnosis Date . Alcohol abuse  . Alcoholic ([place])  . Back pain  . CAP (community acquired pneumonia) 11/26/2018 . COPD (chronic obstructive pulmonary disease) ([place])  . Empyema of right pleural space ([place]) 11/26/2018 . Heavy smoker  . Hep C w/ coma, chronic ([place])  . Loose, teeth  . Pancreatitis  . Poor dentition  . Tobacco abuse  Past Surgical History: Past Surgical History: Procedure Laterality Date . ARTERY REPAIR  2013  neck . BIOPSY  02/19/2019  Procedure: BIOPSY;  Surgeon: Ronnette Juniper, MD;  Location: [facility] ENDOSCOPY;  Service: Gastroenterology;; . ESOPHAGOGASTRODUODENOSCOPY (EGD) WITH PROPOFOL N/A 02/19/2019  Procedure: ESOPHAGOGASTRODUODENOSCOPY (EGD) WITH PROPOFOL;  Surgeon: Ronnette Juniper, MD;  Location: [place];  Service: Gastroenterology;  Laterality: N/A; . FOREIGN BODY REMOVAL  02/19/2019  Procedure: FOREIGN BODY REMOVAL;  Surgeon: Ronnette Juniper, MD;  Location: [place];  Service: Gastroenterology;; . KNEE SURGERY  2013  rt . MICROLARYNGOSCOPY N/A 07/31/2014  Procedure: MICROLARYNGOSCOPY WITH BIOPSY ;  Surgeon: Rozetta Nunnery, MD;  Location: [place];  Service: ENT;  Laterality: N/A; . NECK SURGERY  2013  cervical fusion- . VASCULAR SURGERY   . VIDEO ASSISTED THORACOSCOPY (VATS)/EMPYEMA Right 11/26/2018  Procedure: VIDEO ASSISTED THORACOSCOPY (VATS)/EMPYEMA;  Surgeon: Rexene Alberts, MD;  Location: [place];  Service: Thoracic;  Laterality: Right; [name] VIDEO BRONCHOSCOPY  11/26/2018  Procedure: Video Bronchoscopy;  Surgeon: Rexene Alberts, MD;  Location: [place];  Service: Thoracic;; HPI: 59 year old man with a history of alcohol use disorder, tobacco use disorder, HTN, and recent admission on 04/10/2019 for pancreatitis and hyponatremia who presents for evaluation of shortness of breath and neck swelling with loss of airway and PEA arrest requiring emergent tracheostomy.  Subjective: Pt was alert and cooperative Assessment / Plan / Recommendation CHL IP CLINICAL IMPRESSIONS 07/06/2019 Clinical Impression Pt presents with severe oropharyngeal dysphagia c/b lingual discoordination, premature spillage, reduced base of tonge retraction, decreased pharyngeal peristalsis, incomplete laryngeal closure, decreased hyolaryngeal excusion, and diminished sensation.  These deficits results in silent aspiration of thin and nectar thick liquids during and after the swallow, penetration of honey thick liquid, and significant vallecular stasis of puree consistency.  Aspiration of thin and nectar thick liquids was not always sensed, and pt was unable to follow direction for cued cough.  Reflexive cough was not beneficial to clear aspiration.  There was a moderate amount of aspiration at both anterior and posterior trachea.  Honey thick liquid was penetrated without full clearance, and appeared to progress to the level of the vocal folds; however, there was some residue in laryngeal vestibule from prior trials as well, which may have mixed with honey thick liquid making it thinner.  With puree, there was no prandial penetration or aspiration, but there was severe  vallecular residue which was partially cleared with honey thick liquid wash.  Pt is at risk for aspiration of residueal puree, especially as it mixes with thin secretions. All bolus trials were given with PMV in place.  Pt is a poor candidate for swallowing therapy at this time 2/2 inability to follow directions.  Consider repeat evaluation early next week or as overall status improves. Recommend pt remain NPO with alternate means of nutrition, hydration, and medication.  Pt currently does not have AMN as he has pulled cortrak. If pt is kept PO overnight for cortrak team, and there are priority oral meds that must be given, please crush with puree. SLP Visit Diagnosis Dysphagia, oropharyngeal phase (R13.12) Attention and concentration deficit following -- Frontal lobe and executive function deficit following -- Impact on safety and function --   CHL IP TREATMENT RECOMMENDATION 07/06/2019 Treatment Recommendations Therapy as outlined in treatment plan below   Prognosis 07/06/2019 Prognosis for Safe Diet Advancement Good Barriers to Reach Goals -- Barriers/Prognosis Comment -- CHL IP DIET RECOMMENDATION 07/06/2019 SLP Diet Recommendations NPO;Alternative means - temporary Liquid Administration via -- Medication Administration Via alternative means Compensations -- Postural Changes --   CHL IP OTHER RECOMMENDATIONS 07/06/2019 Recommended Consults -- Oral Care Recommendations Oral care QID Other Recommendations --   CHL IP FOLLOW UP RECOMMENDATIONS 07/06/2019 Follow up Recommendations Skilled Nursing facility   [facility] IP FREQUENCY AND DURATION 07/06/2019 Speech Therapy Frequency (ACUTE ONLY) min 2x/week Treatment Duration 2 weeks      CHL IP ORAL PHASE 07/06/2019 Oral Phase Impaired Oral - Pudding Teaspoon -- Oral - Pudding Cup -- Oral - Honey Teaspoon --  Oral - Honey Cup Decreased bolus cohesion;Premature spillage Oral - Nectar Teaspoon -- Oral - Nectar Cup Decreased bolus cohesion;Premature spillage Oral - Nectar Straw -- Oral  - Thin Teaspoon -- Oral - Thin Cup Decreased bolus cohesion;Premature spillage Oral - Thin Straw -- Oral - Puree Premature spillage Oral - Mech Soft -- Oral - Regular -- Oral - Multi-Consistency -- Oral - Pill -- Oral Phase - Comment --  CHL IP PHARYNGEAL PHASE 07/06/2019 Pharyngeal Phase Impaired Pharyngeal- Pudding Teaspoon -- Pharyngeal -- Pharyngeal- Pudding Cup -- Pharyngeal -- Pharyngeal- Honey Teaspoon -- Pharyngeal -- Pharyngeal- Honey Cup Delayed swallow initiation-pyriform sinuses;Reduced tongue base retraction;Reduced airway/laryngeal closure;Reduced laryngeal elevation;Reduced anterior laryngeal mobility;Penetration/Aspiration during swallow;Penetration/Apiration after swallow;Pharyngeal residue - valleculae;Pharyngeal residue - pyriform Pharyngeal Material enters airway, remains ABOVE vocal cords and not ejected out Pharyngeal- Nectar Teaspoon -- Pharyngeal -- Pharyngeal- Nectar Cup Delayed swallow initiation-pyriform sinuses;Reduced anterior laryngeal mobility;Reduced laryngeal elevation;Reduced airway/laryngeal closure;Reduced tongue base retraction;Penetration/Aspiration during swallow;Penetration/Apiration after swallow;Moderate aspiration;Pharyngeal residue - valleculae;Reduced pharyngeal peristalsis Pharyngeal Material enters airway, passes BELOW cords without attempt by patient to eject out (silent aspiration) Pharyngeal- Nectar Straw -- Pharyngeal -- Pharyngeal- Thin Teaspoon -- Pharyngeal -- Pharyngeal- Thin Cup Reduced anterior laryngeal mobility;Reduced laryngeal elevation;Reduced airway/laryngeal closure;Reduced tongue base retraction;Penetration/Aspiration during swallow;Penetration/Apiration after swallow;Moderate aspiration;Pharyngeal residue - valleculae;Delayed swallow initiation-vallecula Pharyngeal Material enters airway, passes BELOW cords without attempt by patient to eject out (silent aspiration) Pharyngeal- Thin Straw -- Pharyngeal -- Pharyngeal- Puree Delayed swallow  initiation-vallecula;Reduced tongue base retraction;Pharyngeal residue - pyriform Pharyngeal Material does not enter airway Pharyngeal- Mechanical Soft -- Pharyngeal -- Pharyngeal- Regular -- Pharyngeal -- Pharyngeal- Multi-consistency -- Pharyngeal -- Pharyngeal- Pill -- Pharyngeal -- Pharyngeal Comment --  CHL IP CERVICAL ESOPHAGEAL PHASE 07/06/2019 Cervical Esophageal Phase WFL Pudding Teaspoon -- Pudding Cup -- Honey Teaspoon -- Honey Cup -- Nectar Teaspoon -- Nectar Cup -- Nectar Straw -- Thin Teaspoon -- Thin Cup -- Thin Straw -- Puree -- Mechanical Soft -- Regular -- Multi-consistency -- Pill -- Cervical Esophageal Comment -- Leigh E Borum 07/06/2019, 10:51 AM              DG FEMUR, MIN 2 VIEWS RIGHT  Result Date: 07/26/2019 CLINICAL DATA:  Right leg pain EXAM: RIGHT FEMUR 2 VIEWS; RIGHT KNEE - 1-2 VIEW COMPARISON:  None. FINDINGS: No acute fracture or malalignment of the right femur. Right hip joint space is relatively preserved. Mild tricompartmental degenerative changes of the right knee, most pronounced within the medial compartment. Ossification adjacent to the medial femoral condyle likely reflecting sequela of remote MCL injury. No knee joint effusion. Scattered vascular calcifications. Soft tissues are otherwise within normal limits. IMPRESSION: No acute osseous abnormality of the right femur or knee. Electronically Signed   By: Davina Poke D.O.   On: 07/26/2019 15:57      Discharge Exam: Vitals:   08/04/19 0529 08/04/19 0747  BP: (!) 124/55 130/77  Pulse: 82 76  Resp: 15 16  Temp: 98.6 F (37 C) 98 F (36.7 C)  SpO2: 98% 96%    General: Pt is alert, awake, not in acute distress Cardiovascular: RRR, S1/S2 +, no edema Respiratory: CTA bilaterally, no wheezing, no rhonchi, no respiratory distress, no conversational dyspnea  Abdominal: Soft, NT, ND, bowel sounds + Extremities: no edema, no cyanosis Psych: Normal mood and affect, stable judgement and insight     The results  of significant diagnostics from this hospitalization (including imaging, microbiology, ancillary and laboratory) are listed below for reference.     Microbiology: No results  found for this or any previous visit (from the past 240 hour(s)).   Labs: BNP (last 3 results) Recent Labs    11/26/18 1053 07/05/19 1200  BNP 166.6* [id number]   Basic Metabolic Panel: Recent Labs  Lab 07/31/19 0135 08/01/19 0134 08/02/19 0226 08/03/19 0240 08/04/19 0214  NA 141 133* 134* 134* 135  K 3.5 3.7 3.4* 4.2 4.2  CL 101 98 98 100 100  CO2 23 21* 22 22 25   GLUCOSE 123* 116* 112* 108* 113*  BUN 33* 37* 35* 21* 15  CREATININE 1.03 1.27* 0.93 0.85 0.69  CALCIUM 10.2 9.8 9.5 9.5 9.6  MG 1.9 1.8 1.7 1.6* 1.6*   Liver Function Tests: Recent Labs  Lab 07/29/19 0254 07/30/19 1315 07/31/19 0135 08/01/19 0134 08/02/19 0226  AST 36 35 28 24 23   ALT 83* 74* 65* 50* 45*  ALKPHOS 195* 190* 168* 154* 142*  BILITOT 1.2 1.0 0.9 0.9 0.7  PROT 7.9 8.7* 8.2* 7.3 7.0  ALBUMIN 3.3* 3.6 3.2* 3.2* 3.1*   No results for input(s): LIPASE, AMYLASE in the last 168 hours. No results for input(s): AMMONIA in the last 168 hours. CBC: Recent Labs  Lab 07/29/19 0254 07/30/19 1315 07/31/19 0135 08/01/19 0134 08/02/19 0226  WBC 27.0* 10.8* 10.7* 9.4 8.7  NEUTROABS 25.7* 8.3* 7.7 6.3 5.6  HGB 12.7* 13.7 13.2 12.3* 12.4*  HCT 39.8 43.5 41.9 39.1 38.6*  MCV 81.7 81.6 82.0 81.5 80.8  PLT 450* 493* 453* 447* 398   Cardiac Enzymes: No results for input(s): CKTOTAL, CKMB, CKMBINDEX, TROPONINI in the last 168 hours. BNP: Invalid input(s): POCBNP CBG: Recent Labs  Lab 07/31/19 1620 07/31/19 1953 08/01/19 0210 08/01/19 0411 08/01/19 0726  GLUCAP 104* 103* 101* 119* 107*   D-Dimer No results for input(s): DDIMER in the last 72 hours. Hgb A1c No results for input(s): HGBA1C in the last 72 hours. Lipid Profile No results for input(s): CHOL, HDL, LDLCALC, TRIG, CHOLHDL, LDLDIRECT in the last 72  hours. Thyroid function studies No results for input(s): TSH, T4TOTAL, T3FREE, THYROIDAB in the last 72 hours.  Invalid input(s): FREET3 Anemia work up No results for input(s): VITAMINB12, FOLATE, FERRITIN, TIBC, IRON, RETICCTPCT in the last 72 hours. Urinalysis    Component Value Date/Time   COLORURINE YELLOW 06/12/2019 1230   APPEARANCEUR CLEAR 06/12/2019 1230   LABSPEC 1.042 (H) 06/12/2019 1230   PHURINE 5.0 06/12/2019 1230   GLUCOSEU NEGATIVE 06/12/2019 1230   HGBUR SMALL (A) 06/12/2019 1230   BILIRUBINUR NEGATIVE 06/12/2019 1230   KETONESUR NEGATIVE 06/12/2019 1230   PROTEINUR 30 (A) 06/12/2019 1230   UROBILINOGEN 0.2 07/02/2012 1607   NITRITE NEGATIVE 06/12/2019 1230   LEUKOCYTESUR NEGATIVE 06/12/2019 1230   Sepsis Labs Invalid input(s): PROCALCITONIN,  WBC,  LACTICIDVEN Microbiology No results found for this or any previous visit (from the past 240 hour(s)).   Patient was seen and examined on the day of discharge and was found to be in stable condition. Time coordinating discharge: 35 minutes including assessment and coordination of care, as well as examination of the patient.   SIGNED:  Dessa Phi, DO [facility] 08/04/2019, 9:33 AM

## 2019-08-04 NOTE — Progress Notes (Signed)
Pt in room awaiting med delivery from [facility]. He knows he must pick up his Methadone at [facility], but will not be able to get it on the way home because he has no ID. [facility] pharmacy working out co-pays with CM. Reviewed all d/c instructions thoroughly with patient. He is to follow-up with pulmonology in 10 days and with his PCP next week. He states he may want to smoke after discharge, but denies any intention to resume drinking alcohol. I encouraged him to continue with nicotine patches, but educated him that he must remove patch before he smokes, if that is what he decides to do. I reviewed his swallowing guidelines with him and encouraged him to continue using his thicken-up in all liquids. He is going home with what is left of his hospital supply. He was instructed to return to ER for any shortness of breath or chest pain. He will be referred to ENT to follow-up on trach site healing. Patient has no questions and endorses that he understands his instructions. A copy of his instructions was provided.

## 2019-08-04 NOTE — Progress Notes (Signed)
Physical Therapy Treatment Patient Details Name: [name]. MRN: [id number] DOB: 1961-05-22 Today's Date: 08/04/2019    History of Present Illness Pt is a 59 y.o. male admitted 06/12/19 for SOB and neck swelling. Admitted to ICU with loss of airway and PEA arrest requiring emergent trach; has been difficult to wean off sedation. 1/15 R foot MRI suggestive of cellulitis, s/p aspiration. 1/30 return to ICU for hypercarbic respiratory failure and encephalopathy. 2/6 on trach collar 72 hrs and vent liberated. Trach decannulated 2/24. PMH includes recent admission for pancreatitis (04/10/19), ETOH and tobacco use, HTN.   PT Comments    Pt preparing for d/c home today. Today's session focused on gait and balance training. Pt continues to demonstrate poor safety awareness and decreased insight into current deficits, but receptive to education with good teach back during session. Reinforced educ re: fall risk reduction, safety, need for 24/7 supervision. If to remain admitted, will continue to follow acutely.     Follow Up Recommendations  Home health PT;Supervision/Assistance - 24 hour     Equipment Recommendations  Rolling walker with 5" wheels;3in1 (PT)    Recommendations for Other Services       Precautions / Restrictions Precautions Precautions: Fall Restrictions Weight Bearing Restrictions: No    Mobility  Bed Mobility Overal bed mobility: Independent             General bed mobility comments: HOB flat  Transfers Overall transfer level: Needs assistance Equipment used: None;Rolling walker (2 wheeled) Transfers: Sit to/from Stand Sit to Stand: Supervision;Min guard         General transfer comment: Supervision with RW, min guard without DME  Ambulation/Gait Ambulation/Gait assistance: Min guard   Assistive device: None;Rolling walker (2 wheeled) Gait Pattern/deviations: Step-through pattern;Decreased stride length;Decreased weight shift to right;Antalgic    Gait velocity interpretation: 1.31 - 2.62 ft/sec, indicative of limited community ambulator General Gait Details: Pt able to tolerate gait training ~120' without DME and min guard for balance, limited by RLE pain, reaching to handrail for added stability and to offload painful RLE. Additional gait training with RW, pt able to tolerate longer distance   Stairs             Wheelchair Mobility    Modified Rankin (Stroke Patients Only)       Balance Overall balance assessment: Needs assistance Sitting-balance support: No upper extremity supported;Feet supported Sitting balance-Leahy Scale: Good       Standing balance-Leahy Scale: Fair Standing balance comment: statically at sink               High Level Balance Comments: Able to reach down to pick object up off ground with single UE support on RW; supervision-level            Cognition Arousal/Alertness: Awake/alert Behavior During Therapy: WFL for tasks assessed/performed Overall Cognitive Status: Impaired/Different from baseline Area of Impairment: Safety/judgement;Awareness;Problem solving;Attention                   Current Attention Level: Selective Memory: Decreased short-term memory;Decreased recall of precautions   Safety/Judgement: Decreased awareness of safety;Decreased awareness of deficits Awareness: Emergent Problem Solving: Requires verbal cues General Comments: pt not adhering to swallowing precautions unless cued      Exercises      General Comments General comments (skin integrity, edema, etc.): SpO2 96% on RA, HR 105 with activity; pt preparing for d/c home today      Pertinent Vitals/Pain Pain Assessment: Faces Faces Pain Scale: Hurts  little more Pain Location: Lower back into RLE Pain Descriptors / Indicators: Discomfort;Sore Pain Intervention(s): Monitored during session;Premedicated before session    Home Living                      Prior Function             PT Goals (current goals can now be found in the care plan section) Acute Rehab PT Goals Patient Stated Goal: Return home with [facility] services Progress towards PT goals: Progressing toward goals    Frequency    Min 3X/week      PT Plan Current plan remains appropriate    Co-evaluation              AM-PAC PT "6 Clicks" Mobility   Outcome Measure  Help needed turning from your back to your side while in a flat bed without using bedrails?: None Help needed moving from lying on your back to sitting on the side of a flat bed without using bedrails?: None Help needed moving to and from a bed to a chair (including a wheelchair)?: A Little Help needed standing up from a chair using your arms (e.g., wheelchair or bedside chair)?: A Little Help needed to walk in hospital room?: A Little Help needed climbing 3-5 steps with a railing? : A Little 6 Click Score: 20    End of Session Equipment Utilized During Treatment: Gait belt Activity Tolerance: Patient tolerated treatment well Patient left: in chair;with call bell/phone within reach;with chair alarm set Nurse Communication: Mobility status PT Visit Diagnosis: Muscle weakness (generalized) (M62.81);Difficulty in walking, not elsewhere classified (R26.2)     Time: [id number] PT Time Calculation (min) (ACUTE ONLY): 13 min  Charges:  $Gait Training: 8-22 mins                    Mabeline Caras, PT, DPT Acute Rehabilitation Services  Pager [phone] Office [place] 08/04/2019, 12:34 PM

## 2019-08-04 NOTE — Progress Notes (Signed)
Occupational Therapy Treatment Patient Details Name: [name]. MRN: [id number] DOB: [birth year] Today's Date: 08/04/2019    History of present illness Pt is a 59 y.o. male admitted 06/12/19 for SOB and neck swelling. Admitted to ICU with loss of airway and PEA arrest requiring emergent trach; has been difficult to wean off sedation. 1/15 R foot MRI suggestive of cellulitis, s/p aspiration. 1/30 return to ICU for hypercarbic respiratory failure and encephalopathy. 2/6 on trach collar 72 hrs and vent liberated. Trach decannulated 2/24. PMH includes recent admission for pancreatitis (04/10/19), ETOH and tobacco use, HTN.   OT comments  Chief concern with pt returning home continues to be safety due to his impaired awareness of deficits and safety. Pt needing repeated verbal cues for swallowing precautions. Pt stating he plans to get back home and work around his property. Reminded pt he was still walker dependent and unsafe to attempt to work outdoors. Pt instructed to keep phone on him at all times. Pt able to respond appropriately verbally to various safety scenarios.  Updated d/c to home health.  Follow Up Recommendations  Home health OT;Supervision/Assistance - 24 hour    Equipment Recommendations  None recommended by OT    Recommendations for Other Services      Precautions / Restrictions Precautions Precautions: Fall Restrictions Weight Bearing Restrictions: No       Mobility Bed Mobility Overal bed mobility: Independent             General bed mobility comments: HOB flat  Transfers Overall transfer level: Needs assistance Equipment used: Rolling walker (2 wheeled);None Transfers: Sit to/from Stand Sit to Stand: Supervision         General transfer comment: Supervision with RW, min guard without DME    Balance Overall balance assessment: Needs assistance   Sitting balance-Leahy Scale: Good       Standing balance-Leahy Scale: Fair Standing balance  comment: statically at sink                           ADL either performed or assessed with clinical judgement   ADL Overall ADL's : Needs assistance/impaired Eating/Feeding: Supervision/ safety(for swallowing precautions)   Grooming: Wash/dry hands;Standing;Supervision/safety               Lower Body Dressing: Set up;Sitting/lateral leans   Toilet Transfer: Supervision/safety;Ambulation;RW;Regular Toilet   Toileting- Water quality scientist and Hygiene: Supervision/safety;Sit to/from stand       Functional mobility during ADLs: Min guard;Rolling walker General ADL Comments: Educated pt in home safety. Pt able to state steps to take in the event of an emergency and recite 911.     Vision       Perception     Praxis      Cognition Arousal/Alertness: Awake/alert Behavior During Therapy: WFL for tasks assessed/performed Overall Cognitive Status: Impaired/Different from baseline Area of Impairment: Safety/judgement;Awareness;Problem solving                     Memory: Decreased short-term memory;Decreased recall of precautions   Safety/Judgement: Decreased awareness of safety;Decreased awareness of deficits   Problem Solving: Requires verbal cues General Comments: pt not adhering to swallowing precautions unless cued        Exercises     Shoulder Instructions       General Comments      Pertinent Vitals/ Pain       Pain Assessment: No/denies pain  Home Living  Prior Functioning/Environment              Frequency           Progress Toward Goals  OT Goals(current goals can now be found in the care plan section)  Progress towards OT goals: Progressing toward goals  Acute Rehab OT Goals Patient Stated Goal: Return home with [facility] services OT Goal Formulation: With patient Time For Goal Achievement: 08/16/19 Potential to Achieve Goals: Good  Plan Discharge plan needs  to be updated    Co-evaluation                 AM-PAC OT "6 Clicks" Daily Activity     Outcome Measure   Help from another person eating meals?: A Little Help from another person taking care of personal grooming?: A Little Help from another person toileting, which includes using toliet, bedpan, or urinal?: A Little Help from another person bathing (including washing, rinsing, drying)?: A Little Help from another person to put on and taking off regular upper body clothing?: None Help from another person to put on and taking off regular lower body clothing?: A Little 6 Click Score: 19    End of Session Equipment Utilized During Treatment: Gait belt;Rolling walker  OT Visit Diagnosis: Other abnormalities of gait and mobility (R26.89);Muscle weakness (generalized) (M62.81);Other symptoms and signs involving cognitive function   Activity Tolerance Patient tolerated treatment well   Patient Left in bed;with call bell/phone within reach   Nurse Communication          Time: [id number] OT Time Calculation (min): 12 min  Charges: OT General Charges $OT Visit: 1 Visit OT Treatments $Self Care/Home Management : 8-22 mins  Nestor Lewandowsky, OTR/L Acute Rehabilitation Services Pager: [phone] Office: [phone]  [name] 08/04/2019, 10:26 AM

## 2019-08-04 NOTE — Plan of Care (Signed)
Patient DME arrived for discharge. He will be riding home via cab (voucher provided per LCSW). Family will be available to provide support at home. Patient's significant other Volney Presser aware he will be coming home today. She has to work, but patient's brother will meet him at the house.

## 2019-11-07 DEATH — deceased

## 2020-06-23 IMAGING — MR MR ANKLE*R* WO/W CM
7 of 10 series · 27 of 40 positions shown · IV contrast (gadavist)
Comparison: None.

CLINICAL DATA: Soft tissue ankle swelling, infection

EXAM:
MRI OF THE RIGHT ANKLE WITHOUT AND WITH CONTRAST
TECHNIQUE: Multiplanar, multisequence MR imaging of the ankle was performed
before and after the administration of intravenous contrast.
CONTRAST:  9mL GADAVIST GADOBUTROL 1 MMOL/ML IV SOLN

[Series 6: T1 · sagittal · right · 4.0mm · 0.42mm/px · 4 of 24 slices shown (1 of 2)]
[im 1/24]
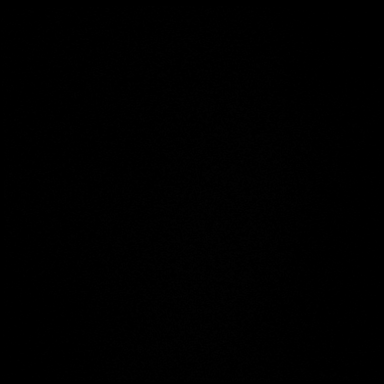
[im 8/24]
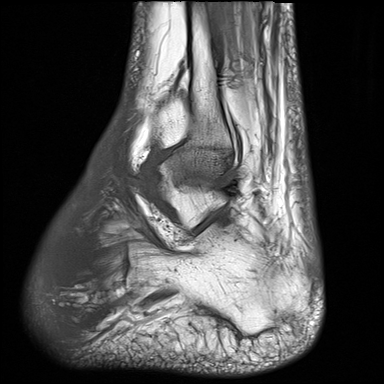
[im 16/24]
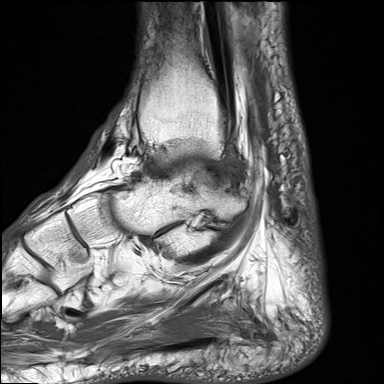
[im 24/24]
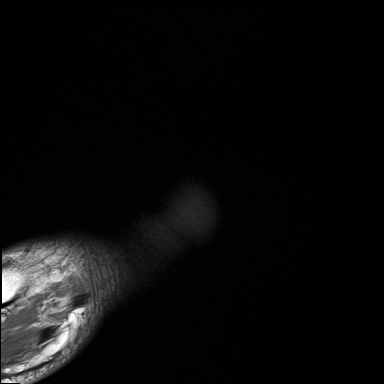

[Series 7: PD fat-sat · axial · right · 3.0mm · 0.31mm/px · z∈[-57,+82]mm · 4 of 36 slices shown]
[im 1/36]
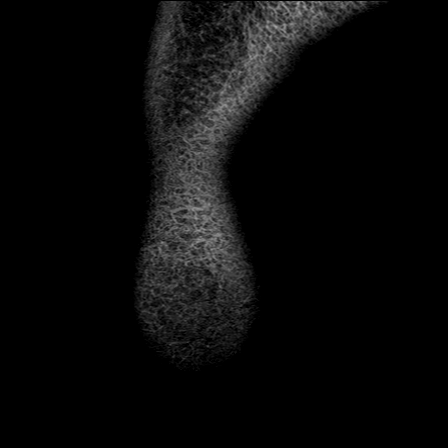
[im 12/36]
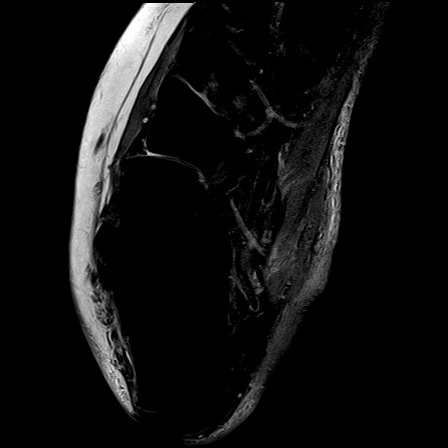
[im 24/36]
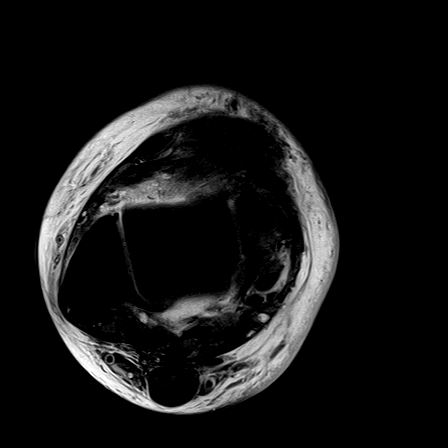
[im 36/36]
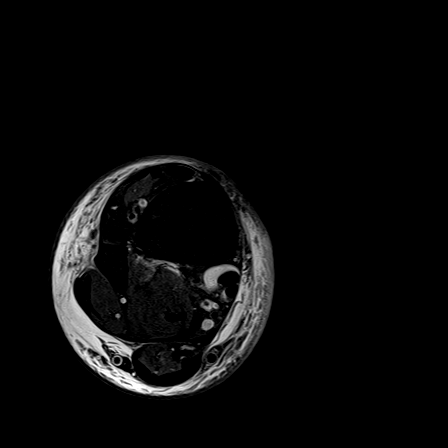

[Series 8: T2 fat-sat · axial · right · 3.0mm · 0.44mm/px · z∈[-57,+82]mm · 4 of 36 slices shown (1 of 2)]
[im 1/36]
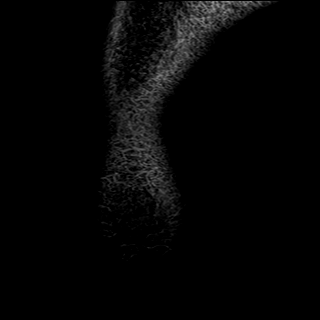
[im 12/36]
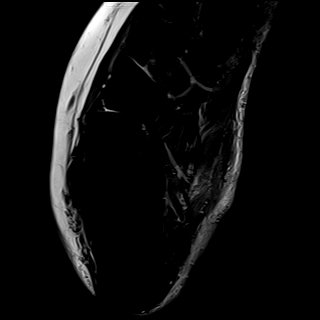
[im 24/36]
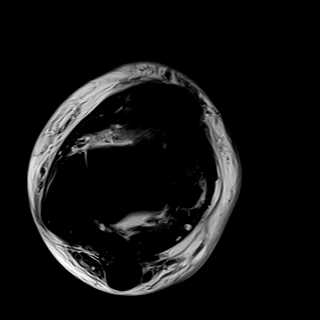
[im 36/36]
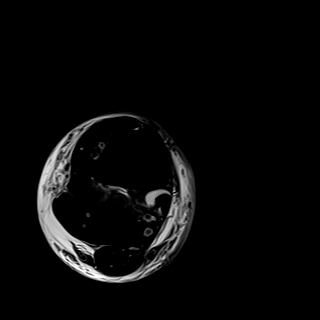

[Series 9: T2 fat-sat · coronal · right · 3.0mm · 0.36mm/px · 4 of 36 slices shown (2 of 2)]
[im 1/36]
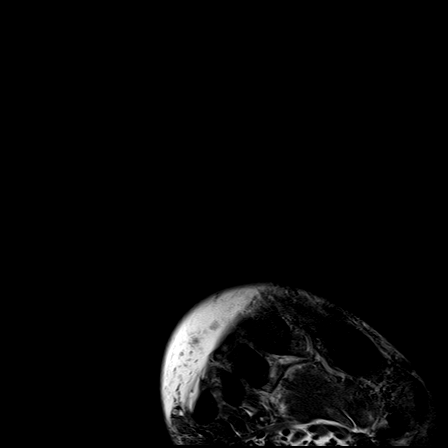
[im 12/36]
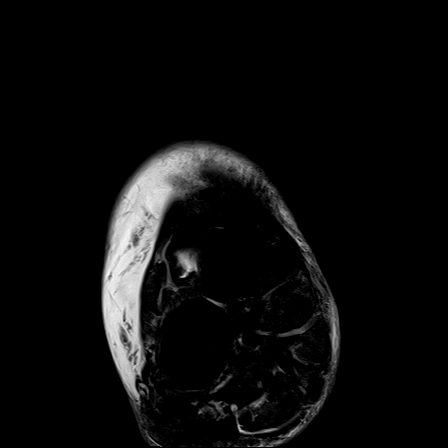
[im 24/36]
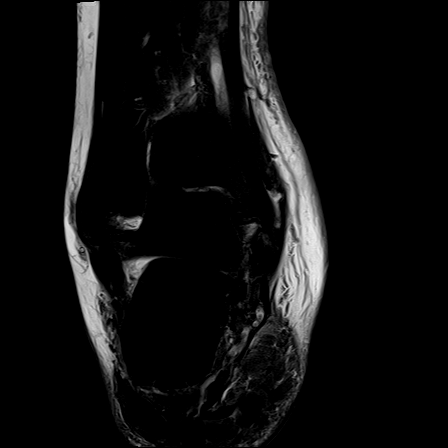
[im 36/36]
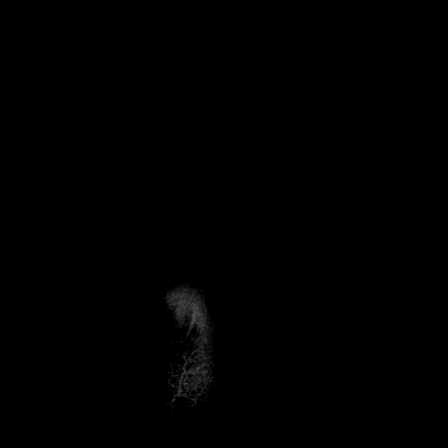

[Series 10: T1 fat-sat · axial · non-contrast · right · 3.0mm · 0.29mm/px · z∈[-57,+83]mm · 4 of 36 slices shown]
[im 1/36]
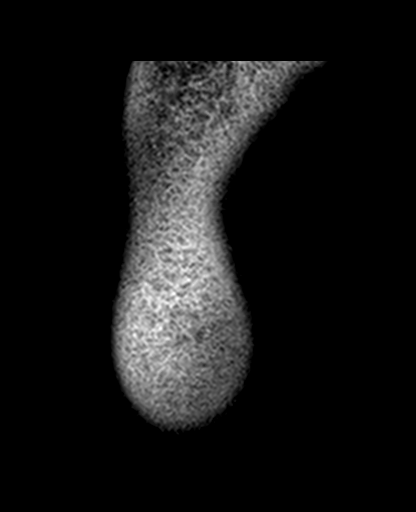
[im 12/36]
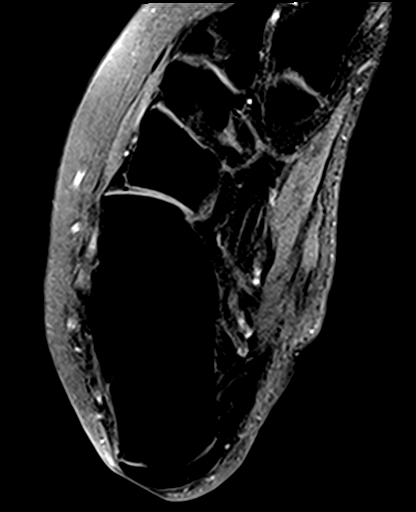
[im 24/36]
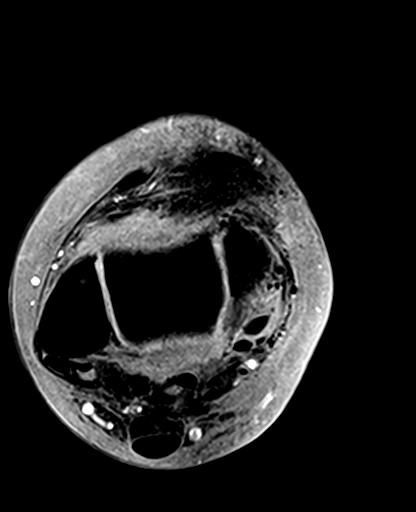
[im 36/36]
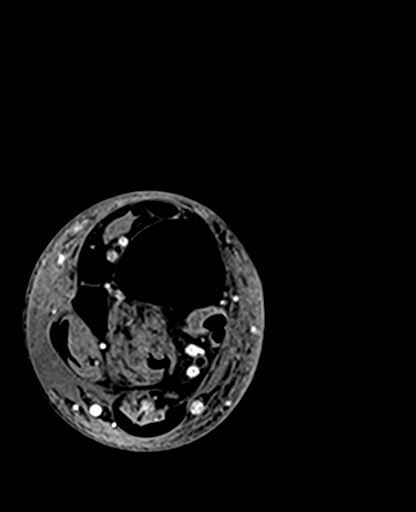

[Series 11: T1 · sagittal · right · 4.0mm · 0.42mm/px · 4 of 28 slices shown (2 of 2)]
[im 1/28]
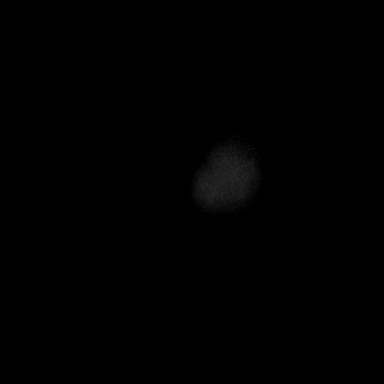
[im 10/28]
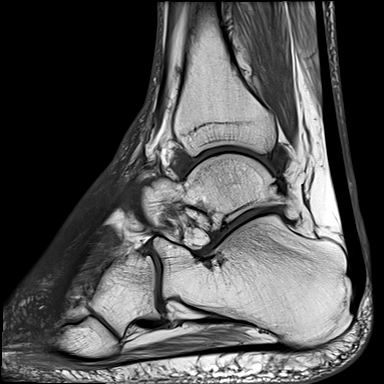
[im 19/28]
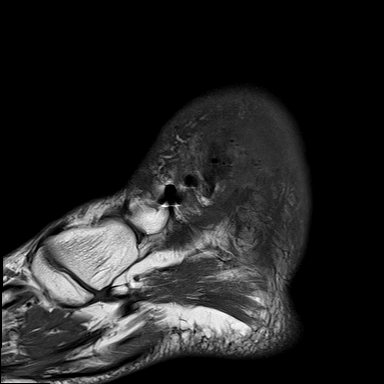
[im 28/28]
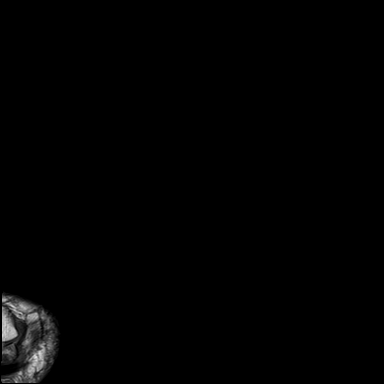

[Series 12: T1 fat-sat post-contrast · axial · right · 3.0mm · 0.29mm/px · z∈[-57,+35]mm · 3 of 36 slices shown]
[im 1/36]
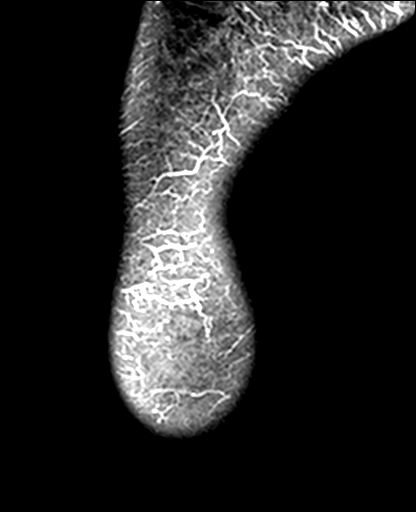
[im 12/36]
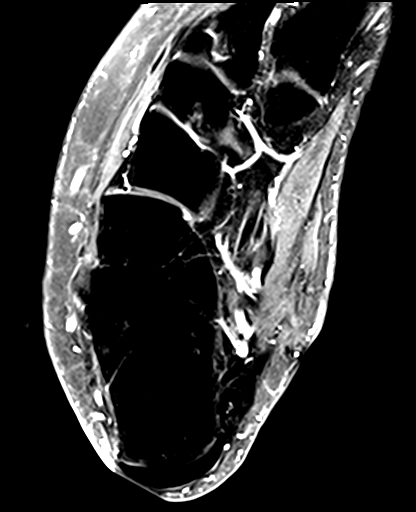
[im 24/36]
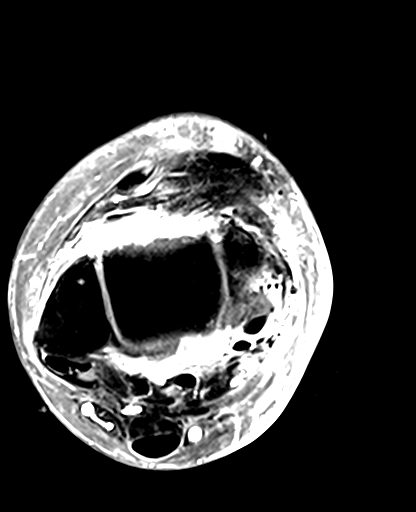

[27 of 40 positions shown; findings below may reference images not displayed]

FINDINGS: TENDONS

Peroneal: Peroneal longus tendon intact. Peroneal brevis intact.

Posteromedial: Diffusely increased intrasubstance signal and
thickening with a tiny focal interstitial tear and surrounding fluid
of the posterior tibialis tendon. A small amount of fluid is seen
surrounding the flexor digitorum and flexor hallucis longus tendon.
Anterior: Tibialis anterior tendon intact. Extensor hallucis longus
tendon intact Extensor digitorum longus tendon intact.

Achilles: Intact. However there is mildly increased signal seen
within the

Plantar Fascia: Increased intrasubstance signal seen within the
middle cord of the plantar fascia.

LIGAMENTS

Lateral: Complete disruption of the anterior talofibular ligament is
seen with fluid in the syndesmosis. There is increased
intrasubstance signal in the posterior talofibular ligament, however
it is intact. Calcaneofibular ligament intact. Posterior
talofibular ligament intact. Anterior and posterior tibiofibular
ligaments intact.

Medial: Diffusely increased intrasubstance signal seen within the
deltoid ligament, however there are intact fibers. Increased
intrasubstance signal seen throughout the spring ligament.

CARTILAGE

Ankle Joint: There is a small enhancing joint effusion, however no
definite synovial thickening is seen. Normal ankle mortise. No
chondral defect.

Subtalar Joints/Sinus Tarsi: Small amount of enhancing subtalar
joint effusion is seen. There is edema within the sinus tarsi.

Bones: No marrow signal abnormality. No fracture, marrow edema, or
pathologic marrow infiltration.

Soft Tissue: Overlying the anteromedial aspect of the ankle at the
navicular there is susceptibility artifact noted within the soft
tissues. There is diffuse subcutaneous edema seen surrounding the
ankle with skin thickening. No loculated fluid collection or sinus
tract is seen. There is heel pad inflammation seen.
IMPRESSION: IMPRESSION
1. No evidence of osteomyelitis, or soft tissue abscess.
2. Mildly enhancing small ankle joint effusion which could be due to
synovitis.
3. Focal interstitial tear with posterior tibialis tenosynovitis.
4. Mild flexor digitorum and flexor hallucis longus tenosynovitis.
5. Focal complete disruption of the anterior talofibular ligament
with fluid in the syndesmosis.
6. Diffuse subcutaneous edema and cellulitis.
7. Small subtalar joint effusion and sinus tarsi edema.
8. Mild insertional Achilles tendinosis and middle cord plantar
fasciitis.

## 2020-06-23 IMAGING — DX DG CHEST 1V PORT
1 series · 1 of 1 positions shown · non-contrast
Comparison: CT 06/19/2019.  Chest x-ray 06/16/2019.

CLINICAL DATA: Intubation.

EXAM:
PORTABLE CHEST 1 VIEW

[chest ap]
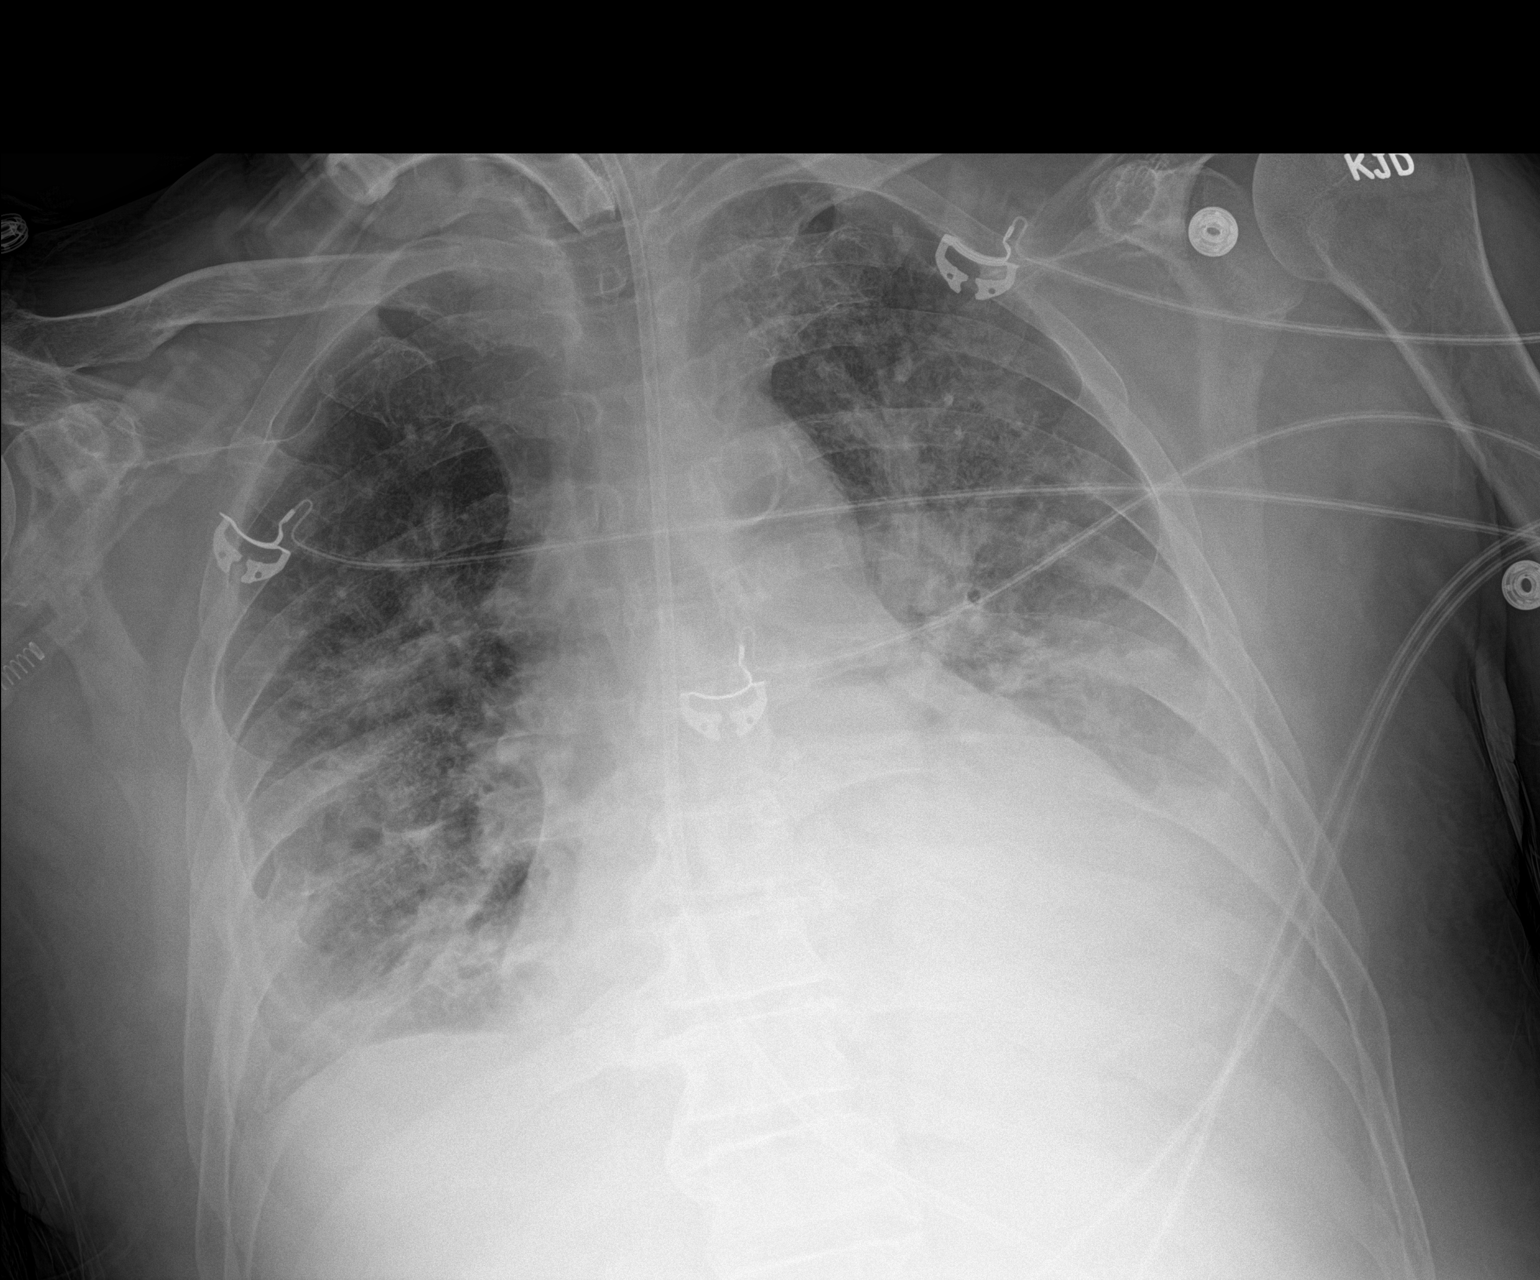

[1 of 1 positions shown; findings below may reference images not displayed]

FINDINGS: Tracheostomy tube and feeding tube in stable position. Stable
cardiomegaly. Persistent prominent bilateral pulmonary
infiltrates/edema again noted. Atelectatic changes both lung bases.
Small bilateral pleural effusions again noted. No pneumothorax.
Reference is made to chest CT report for discussion of rib
fractures.
IMPRESSION: 1.  Tracheostomy tube and feeding tube in stable position.

2.  Stable cardiomegaly.

3. Persistent prominent bilateral pulmonary infiltrates/edema again
noted. Atelectatic changes both lung bases. Small bilateral pleural
effusions again noted.

4. Reference is made to chest CT report of 06/19/2019 for discussion
of rib fractures present.
